# Patient Record
Sex: Male | Born: 1950
Health system: Southern US, Community
[De-identification: ages and names within clinical notes are randomized; demographics above are authoritative.]

## PROBLEM LIST (undated history)

## (undated) DIAGNOSIS — M1990 Unspecified osteoarthritis, unspecified site: Secondary | ICD-10-CM

## (undated) DIAGNOSIS — I639 Cerebral infarction, unspecified: Secondary | ICD-10-CM

## (undated) DIAGNOSIS — E785 Hyperlipidemia, unspecified: Secondary | ICD-10-CM

## (undated) DIAGNOSIS — I10 Essential (primary) hypertension: Secondary | ICD-10-CM

## (undated) DIAGNOSIS — I422 Other hypertrophic cardiomyopathy: Secondary | ICD-10-CM

## (undated) DIAGNOSIS — K219 Gastro-esophageal reflux disease without esophagitis: Secondary | ICD-10-CM

## (undated) DIAGNOSIS — R011 Cardiac murmur, unspecified: Secondary | ICD-10-CM

## (undated) DIAGNOSIS — I2510 Atherosclerotic heart disease of native coronary artery without angina pectoris: Secondary | ICD-10-CM

## (undated) DIAGNOSIS — C801 Malignant (primary) neoplasm, unspecified: Secondary | ICD-10-CM

## (undated) DIAGNOSIS — I219 Acute myocardial infarction, unspecified: Secondary | ICD-10-CM

## (undated) HISTORY — DX: Hyperlipidemia, unspecified: E78.5

## (undated) HISTORY — DX: Unspecified osteoarthritis, unspecified site: M19.90

## (undated) HISTORY — DX: Atherosclerotic heart disease of native coronary artery without angina pectoris: I25.10

## (undated) HISTORY — DX: Essential (primary) hypertension: I10

## (undated) HISTORY — DX: Cerebral infarction, unspecified: I63.9

## (undated) HISTORY — DX: Acute myocardial infarction, unspecified: I21.9

---

## 1991-12-05 HISTORY — PX: KNEE SURGERY: SHX244

## 1994-12-04 HISTORY — PX: ELBOW SURGERY: SHX618

## 2005-12-04 DIAGNOSIS — I2510 Atherosclerotic heart disease of native coronary artery without angina pectoris: Secondary | ICD-10-CM

## 2005-12-04 HISTORY — PX: CARDIAC CATHETERIZATION: SHX172

## 2005-12-04 HISTORY — DX: Atherosclerotic heart disease of native coronary artery without angina pectoris: I25.10

## 2006-01-01 ENCOUNTER — Emergency Department (HOSPITAL_COMMUNITY): Admission: EM | Admit: 2006-01-01 | Discharge: 2006-01-02 | Payer: Self-pay | Admitting: Emergency Medicine

## 2006-03-01 ENCOUNTER — Inpatient Hospital Stay (HOSPITAL_COMMUNITY): Admission: EM | Admit: 2006-03-01 | Discharge: 2006-03-03 | Payer: Self-pay | Admitting: Emergency Medicine

## 2008-08-11 LAB — HM COLONOSCOPY

## 2008-12-04 HISTORY — PX: CARDIAC CATHETERIZATION: SHX172

## 2009-07-13 ENCOUNTER — Encounter: Admission: RE | Admit: 2009-07-13 | Discharge: 2009-07-13 | Payer: Self-pay | Admitting: Cardiology

## 2009-07-15 ENCOUNTER — Ambulatory Visit (HOSPITAL_COMMUNITY): Admission: RE | Admit: 2009-07-15 | Discharge: 2009-07-15 | Payer: Self-pay | Admitting: Cardiology

## 2009-08-12 ENCOUNTER — Ambulatory Visit: Admission: RE | Admit: 2009-08-12 | Discharge: 2009-08-12 | Payer: Self-pay | Admitting: Cardiology

## 2010-09-13 LAB — HEPATIC FUNCTION PANEL
ALK PHOS: 48 U/L (ref 25–125)
ALT: 28 U/L (ref 10–40)
AST: 23 U/L (ref 14–40)
Bilirubin, Total: 1.5 mg/dL

## 2010-09-13 LAB — BASIC METABOLIC PANEL
BUN: 14 mg/dL (ref 4–21)
Creatinine: 1.2 mg/dL (ref <=1.3)
Glucose: 82 mg/dL
Potassium: 4.8 mmol/L (ref 3.4–5.3)
Sodium: 142 mmol/L (ref 137–147)

## 2010-09-13 LAB — CBC AND DIFFERENTIAL
HCT: 47 % (ref 41–53)
HEMOGLOBIN: 15.8 g/dL (ref 13.5–17.5)
Platelets: 193 10*3/uL (ref 150–399)
WBC: 6.7 10^3/mL

## 2010-09-13 LAB — CHLORIDE: Chloride: 106 mmol/L

## 2010-09-13 LAB — ALBUMIN: Albumin: 4.9

## 2010-09-13 LAB — TSH: TSH: 3.16 u[IU]/mL (ref <=5.90)

## 2010-09-13 LAB — PSA: PSA: 1.4

## 2010-12-25 ENCOUNTER — Encounter: Payer: Self-pay | Admitting: Family Medicine

## 2011-03-11 LAB — POCT I-STAT 3, ART BLOOD GAS (G3+)
Acid-Base Excess: 1 mmol/L (ref 0.0–2.0)
Bicarbonate: 25.7 mEq/L — ABNORMAL HIGH (ref 20.0–24.0)
TCO2: 27 mmol/L (ref 0–100)
pO2, Arterial: 98 mmHg (ref 80.0–100.0)

## 2011-03-11 LAB — POCT I-STAT 3, VENOUS BLOOD GAS (G3P V)
TCO2: 27 mmol/L (ref 0–100)
pH, Ven: 7.349 — ABNORMAL HIGH (ref 7.250–7.300)
pO2, Ven: 44 mmHg (ref 30.0–45.0)

## 2011-04-18 NOTE — Cardiovascular Report (Signed)
Brandon Gonzales, PULLIN               ACCOUNT NO.:  [id number]   MEDICAL RECORD NO.:  [id number]          PATIENT TYPE:  OIB   LOCATION:  2899                         FACILITY:  [facility]   PHYSICIAN:  Peter M. Swaziland, M.D.  DATE OF BIRTH:  03-25-1951   DATE OF PROCEDURE:  07/15/2009  DATE OF DISCHARGE:  07/15/2009                            CARDIAC CATHETERIZATION   INDICATIONS FOR PROCEDURE:  The patient is a 60 year old white male with  known history of coronary artery disease status post stenting of the LAD  and angioplasty of the diagonal branch in 2007.  He has a history of  hypertension and dyslipidemia.  He presents with symptoms of refractory  dyspnea on exertion.  This has been progressive.  Given these findings,  cardiac catheterization was recommended.   ACCESS:  Via the right femoral artery and vein using standard Seldinger  technique.   EQUIPMENT:  A 6-French 4 cm right and left Judkins catheter, 6-French  pigtail catheter, 6-French arterial sheath, 7-French venous sheath, 7-  French balloon-tip Swan-Ganz catheter.   PROCEDURES:  Right and left heart catheterization, coronary and left  ventricular angiography.   MEDICATIONS:  Local anesthesia 1% Xylocaine, Versed 2 mg IV.   CONTRAST:  Omnipaque 100 mL.   HEMODYNAMIC DATA:  Right atrial pressure is 8/5 with a mean of 4 mmHg.  Right ventricular pressure is 27 with an EDP of 5 mmHg.  Pulmonary  artery pressures 25/9 with a mean of 16 mmHg.  Pulmonary capillary wedge  pressure is 13/10 with a mean of 9 mmHg.  Aortic pressure is 114/66 with  a mean of 87 mmHg.  Left ventricular pressure is 110 with an EDP of 10  mmHg.  There is no mitral or aortic valve gradient.  Oxygen saturation  is 98% in the aorta and 77% in the pulmonary artery.  Cardiac output is  5.66 L/min with an index of 3.03.   ANGIOGRAPHIC DATA:  The left coronary artery arises and distributes  normally.  The left main coronary artery is normal.   The left  anterior descending artery has 20-30% narrowing in the proximal  vessel.  There is a long stent in the proximal to mid vessel that is  widely patent and has less than 20% irregularities.  The remainder of  the LAD is without significant disease.  There is a diagonal of moderate  size arising from within the stent.  There is approximately 20% ostial  stenosis in the diagonal.   The left circumflex coronary artery gives rise to a moderate first and  third obtuse marginal branches and a very tiny second obtuse marginal  vessel.  There is a segmental 50-60% stenosis in the mid circumflex.  The very tiny second marginal branch has a 90% ostial stenosis.   The right coronary artery is a large dominant vessel.  It appears normal  throughout its course.   Left ventricular angiography performed in the RAO view demonstrates  normal left ventricular size and contractility with normal systolic  function.  Ejection fraction is estimated at 65%.  There was no mitral  regurgitation or  prolapse.   FINAL INTERPRETATION:  1. Nonobstructive atherosclerotic coronary artery disease.  The stent      in the LAD is still widely patent.  There is moderate mid      circumflex disease that is unchanged from 2007.  2. Normal left ventricular function.  3. Normal right heart pressures.   PLAN:  Recommend continued medical therapy.           ______________________________  Peter M. Swaziland, M.D.     PMJ/MEDQ  D:  07/15/2009  T:  07/16/2009  Job:  [id number]   cc:   Holley Bouche, M.D.

## 2011-04-21 NOTE — H&P (Signed)
Brandon Gonzales, Brandon Gonzales               ACCOUNT NO.:  [id number]   MEDICAL RECORD NO.:  [id number]          PATIENT TYPE:  INP   LOCATION:  6523                         FACILITY:  [facility]   PHYSICIAN:  Francisca December, M.D.  DATE OF BIRTH:  [birth year]   DATE OF ADMISSION:  03/01/2006  DATE OF DISCHARGE:                                HISTORY & PHYSICAL   REASON FOR ADMISSION:  Chest pain.   HISTORY OF PRESENT ILLNESS:  Brandon Gonzales is a pleasant 60 year old Gonzales  without previous significant cardiac history but with a longstanding  hypertension.  He did recently undergo an upper cardiogram at Dr. Elvis Coil  office that apparently showed mitral valve prolapse.  Over the past week, he  developed increasing shortness of breath with exertion such that now minimal  walking will make him quite out of breath.  Two days ago he awoke with  anterior substernal burning chest discomfort that radiated to across the  chest and into the left upper and lower arm, questionably into the jaw as  well.  There was not associated diaphoresis and resolved spontaneously after  about one hour.  There was also no nausea.  The symptoms then recurred this  afternoon, and the patient called EMS.  He received 4 baby aspirin and a  sublingual nitroglycerin in the ambulance, and by the time of arrival in the  emergency room, his symptoms were significantly decreased. This was around  1840. At the time of my evaluation at 2145, his symptoms are now starting to  increase again.   PAST MEDICAL HISTORY:  1.  Hypertension.  2.  GERD.   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  No alcohol or tobacco use.   CURRENT MEDICATIONS:  1.  Over-the-counter Prilosec 20 mg p.o. twice daily.  2.  Norvasc 5 mg p.o. daily.  3.  Lisinopril 10 mg p.o. daily.  4.  Propecia.   DRUG ALLERGIES:  None known.   FAMILY HISTORY:  Mother died with myocardial infarction at age 42.  Father  had  coronary artery disease later in age.   REVIEW OF SYSTEMS:  Negative except as mentioned above.   PHYSICAL EXAMINATION:  VITAL SIGNS: Blood pressure 142/94, pulse 98 and  regular, temperature 98.1, respirations 20.  GENERAL: A Brandon year old, comfortable, well-appearing Gonzales in no distress.  HEENT:  Unremarkable.  The head is atraumatic and normocephalic. Pupils  equal, round, and reactive to light and accommodation.  Extraocular  movements are intact.  Sclerae are anicteric.  Oral mucosa is pink and  moist.  Tongue is not coated.  Teeth and gums in good repair.  NECK: Supple without thyromegaly or mass.  Carotid upstrokes were normal.  There is no bruit.  There is no jugular venous distention.  CHEST: Clear with good excursion bilaterally.  No wheezes, rales, or  rhonchi.  HEART: Regular rhythm. Normal S1 and S2 is heard.  No S3, S4, murmur, click,  or rub noted.  ABDOMEN: Soft, flat, nontender, without hepatosplenomegaly or midline  pulsatile mass. Bowel sounds are present in  all quadrants.  EXTERNAL GENITALIA: Normal male phallus, descended testicles, no lesion.  RECTAL: No performed.  EXTREMITIES:  Full range of motion, no edema, intact distal pulses.  NEUROLOGIC: Cranial nerves II-XII intact.  Motor and sensory grossly intact.  Gait not tested.  SKIN:  Warm, dry, and clear.   Electrocardiogram: Normal sinus rhythm, normal EKG.   Point-of-care cardiac enzymes positive x1.   Chest x-ray pending.   IMPRESSION:  1.  Unstable angina pectoris, new onset angina.  2.  Hypertension.  3.  Gastroesophageal reflux disease.  His gastroesophageal reflux disease      symptoms are not similar to this.   PLAN:  1.  The patient is admitted to telemetry monitoring.  2.  Rule out myocardial infarction protocol.  3.  Serial CK and troponin enzymes will be obtained.  4.  Repeat ECG in the a.m.  5.  Will begin IV nitroglycerin at 10 mcg per minute, titrate as necessary      to maintain systolic  blood pressure greater than 110.  6.  Begin subcutaneous Lovenox 35 mg q. 12 h.  7.  The patient has already received aspirin today.  Will continue.  8.  Begin metoprolol 25 mg p.o. twice daily.  9.  Remain n.p.o. past midnight.  10. Catheterization versus Cardiolite per Dr. Swaziland in the morning.      Francisca December, M.D.  Electronically Signed     JHE/MEDQ  D:  03/01/2006  T:  03/03/2006  Job:  [id number]   cc:   Holley Bouche, M.D.  Fax: [phone]   Peter M. Swaziland, M.D.  Fax: [phone]

## 2011-04-21 NOTE — Discharge Summary (Signed)
NAMELASARO, PRIMM               ACCOUNT NO.:  [id number]   MEDICAL RECORD NO.:  [id number]          PATIENT TYPE:  INP   LOCATION:  6523                         FACILITY:  [facility]   PHYSICIAN:  Peter M. Swaziland, M.D.  DATE OF BIRTH:  03/08/1951   DATE OF ADMISSION:  03/01/2006  DATE OF DISCHARGE:  03/03/2006                                 DISCHARGE SUMMARY   HISTORY OF PRESENT ILLNESS:  Mr. Lecomte is a 60 year old white male with a  history of long-standing hypertension who presented with a one week history  of increasing dyspnea on exertion now progressing to symptoms of dyspnea at  rest.  He has also had some associated anterior substernal chest burning and  discomfort radiating across his chest into his left upper and lower arm and  into his jaw.  The patient's symptoms were relieved with nitroglycerin.  He  was admitted for further evaluation. For details of his past medical  history, social history, family history, and physical exam, please see  admission history and physical.   LABORATORY DATA:  His chest x-ray showed minimal left lower lobe  atelectasis, otherwise clear. His ECG showed normal sinus rhythm with normal  ECG.  White count was 5400, hemoglobin 14.9, hematocrit 42.1, platelets  175,000.  Coags were normal.  Sodium 141, potassium 4.3, chloride 108, CO2  27, glucose 100, BUN 8, creatinine 1.4. Cardiac enzymes were negative x3.  Cholesterol is 216, triglycerides 258, HDL 28, LDL 136.   HOSPITAL COURSE:  The patient was admitted to telemetry monitoring.  He was  initially treated with IV nitroglycerin and subcu Lovenox.  He was started  on beta blocker therapy.  Because of his fairly classic symptoms, we  recommended further evaluation with cardiac catheterization.  This was  performed on March 02, 2006.  He had a severe bifurcation stenosis in the  LAD diagonal.  This is up to 70-80% in the proximal LAD and 90% ostial  stenosis of the first diagonal.  There was  moderate diffuse disease in the  distal circumflex up to 50%.  The right coronary was without significant  disease.  He had a normal left ventricular function.  Abdominal aortography  showed no aneurysm and his renal arteries appeared normal.  He subsequently  underwent successful stenting of the LAD with balloon angioplasty of  diagonal through the stent.  The LAD was stented with a 3 x 28 mm Liberty  stent.  He was treated with aspirin, Plavix and had received 18 hours of IV  Integrilin.  He did very well following the procedure and had no recurrent  chest pain.  He had no groin complications.  His ECG remained normal.  He  was ambulated and able to be discharged home the following day.   DISCHARGE DIAGNOSIS:  1.  Unstable angina.  2.  Coronary disease status post successful stenting of the left anterior      descending proximal to mid and also balloon angioplasty of the first      diagonal branch.  3.  Hypertension.  4.  Dyslipidemia.  5.  Gastroesophageal reflux  disease.   DISCHARGE MEDICATIONS:  Lisinopril to 10 mg, 1/2 tablet daily, coated  aspirin 325 mg daily, Plavix 75 mg per day, Norvasc 5 mg per day, Prilosec  20 mg per day, Lipitor 10 mg per day, Toprol XL 25 mg per day, nitroglycerin  sublingual p.r.n.   DISCHARGE INSTRUCTIONS:  He will follow up with Dr. Swaziland in two weeks.   DISCHARGE STATUS:  Improved.           ______________________________  Peter M. Swaziland, M.D.     PMJ/MEDQ  D:  04/05/2006  T:  04/05/2006  Job:  [id number]   cc:   Holley Bouche, M.D.  Fax: [phone]

## 2011-04-21 NOTE — Cardiovascular Report (Signed)
Brandon Gonzales, Brandon Gonzales               ACCOUNT NO.:  [id number]   MEDICAL RECORD NO.:  [id number]          PATIENT TYPE:  INP   LOCATION:  6523                         FACILITY:  [facility]   PHYSICIAN:  Peter M. Swaziland, M.D.  DATE OF BIRTH:  04-06-1951   DATE OF PROCEDURE:  03/02/2006  DATE OF DISCHARGE:  03/03/2006                              CARDIAC CATHETERIZATION   INDICATIONS FOR PROCEDURE:  A 60 year old white male with history of  hypertension and dyslipidemia who presents with unstable angina.   PROCEDURES:  Left heart catheterization, coronary and left ventricular  angiography, abdominal aortography, intracoronary stenting of the proximal  to mid-LAD with balloon angioplasty of the first diagonal branch.   ACCESS:  Via right femoral artery using standard Seldinger technique.   EQUIPMENT:  6-French 4 cm right and left Judkins catheter, 6-French pigtail  catheter, 6-French arterial sheath, a 6-French left Voda 3.5 guide, 0.014 Hi-  Torque floppy wire x2, 3.0 x 20 mm Quantum Maverick balloon and a 3.0 x 28  mm Liberte stent.   CONTRAST:  260 mL of Omnipaque.   MEDICATIONS:  The patient was on nitroglycerin drip at 10 mcg per minute,  Versed 2 mg IV, heparin 4300 units IV with subsequent ACT of 371, Integrilin  double bolus 180 mcg/kg followed by continuous infusion of 2 mcg/kg per  minute, nitroglycerin 200 mcg intracoronary x2, Plavix 600 mg p.o.   HEMODYNAMIC DATA:  Aortic pressure is 113/72 with a mean of 92 mmHg.  Left  ventricular pressure is 113 with EDP of 16 mmHg.   ANGIOGRAPHIC DATA:  Left coronary arises and distributes normally.  The left  main coronary is normal.   Left anterior descending artery has a long segmental 70% stenosis extending  from the proximal to mid-vessel, spanning the first diagonal branch and the  first septal perforator.  The first diagonal branch has a 90% ostial  stenosis with diffuse 50-60% stenosis in the proximal vessel.   The left  circumflex coronary gives rise to a small first obtuse marginal  vessel.  It then gives rise to a large terminal second obtuse marginal  branch.  There is a 50-60% stenosis in the distal circumflex prior to the  second obtuse marginal branch.   The right coronary is a very large dominant vessel that appears normal.   Left ventricular angiography was performed in the RAO view.  This  demonstrates normal left ventricular size and contractility with normal  systolic function.  Ejection fraction is estimated at 65-70%.  There is no  significant mitral insufficiency.  The aortic root appears normal.   Abdominal aortography demonstrates normal aortic caliber without aneurysm.  The renal arteries and mesenteric vessels all appear normal.   INTERVENTIONAL PROCEDURE:  We proceeded at this time with intervention of  the LAD diagonal bifurcation stenosis.  The patient was premedicated as  noted above.  We had excellent guide support.  Both the LAD and diagonal  were wired simultaneously.  We initially dilated the diagonal branch using a  3.0 x 20 mm Quantum Maverick balloon dilating to 9 and then to 12  atmospheres.  We then used the same balloon to dilate the LAD to 12  atmospheres x2.  We then again switched to the diagonal branch and dilated  once again to 10 atmospheres.  At this point, there appeared to be improved  perfusion down both branches, although there appeared to be a focal intimal  tear in the LAD.  The LAD was stented using a 3.0 x 28 mm Liberte stent  spanning the first diagonal branch.  This was deployed at 9 atmospheres and  postdilated to 14 atmospheres.  Prior to deploying the stent, the diagonal  wire was withdrawn.  After deployment of the LAD stent, the diagonal was  once again wired and was dilated through the stent with the 3.0 mm Quantum  up to 14 atmospheres.  This yielded an excellent angiographic result in both  the LAD and diagonal with 0% residual stenosis and TIMI  grade III flow down  both branches.  It was noted during all balloon inflations the patient has  typical anginal symptoms.  He was painfree at the end the procedure.   FINAL INTERPRETATION:  1.  Single-vessel obstructive atherosclerotic coronary disease with complex      bifurcation stenosis of the left anterior descending artery diagonal.  2.  Moderate nonobstructive atherosclerotic disease in the distal      circumflex.  3.  Normal left ventricular function.  4.  Normal abdominal aorta and renal arteries.  5.  Successful coronary intervention with stenting of the proximal to mid-      left anterior descending artery and balloon angioplasty of the first      diagonal branch.           ______________________________  Peter M. Swaziland, M.D.     PMJ/MEDQ  D:  03/02/2006  T:  03/05/2006  Job:  [id number]   cc:   Holley Bouche, M.D.  Fax: [phone]

## 2011-06-16 ENCOUNTER — Other Ambulatory Visit: Payer: Self-pay | Admitting: Cardiology

## 2011-06-16 NOTE — Telephone Encounter (Signed)
escribe medication per fax request  

## 2011-12-05 HISTORY — PX: CORONARY ANGIOPLASTY WITH STENT PLACEMENT: SHX49

## 2011-12-13 ENCOUNTER — Other Ambulatory Visit: Payer: Self-pay | Admitting: *Deleted

## 2011-12-25 ENCOUNTER — Other Ambulatory Visit: Payer: Self-pay

## 2011-12-25 ENCOUNTER — Telehealth: Payer: Self-pay | Admitting: Cardiology

## 2011-12-25 DIAGNOSIS — I2510 Atherosclerotic heart disease of native coronary artery without angina pectoris: Secondary | ICD-10-CM

## 2011-12-25 DIAGNOSIS — E785 Hyperlipidemia, unspecified: Secondary | ICD-10-CM

## 2011-12-25 NOTE — Telephone Encounter (Signed)
New Problem:     Patient called in needing refills of his amlodipine (it is not in file).  Please call back if you have any questions.

## 2011-12-25 NOTE — Telephone Encounter (Signed)
Follow-up:    Patient needs to have some orders for blood work placed for the 4th of February.

## 2011-12-25 NOTE — Telephone Encounter (Signed)
Follow-up:     Patient has called back and would really like to speak with you about his prescription of Amlodipine that is not on file. Please call back.

## 2011-12-25 NOTE — Telephone Encounter (Signed)
Patient called this evening after not receiving a call back earlier re: amlodipine which is not currently on file. The last OV I see is from 2010 and did in fact list amlodipine, but he has not been seen since. He last filled this 09/19/11 with 90 tabs and ran out yesterday. He has an OV coming up in February. Since I did not see a decision documented earlier today (notes indicate our office tried to call patient back), I offered to call in emergency RX of Norvasc 10mg  one tab po daily disp #3 zero refills to [facility]. The patient expressed understanding and gratitude and will call back tomorrow to find out office's decision.  Dayna Dunn PA-C

## 2011-12-25 NOTE — Telephone Encounter (Signed)
Orders for Bmet,Lipids,Hepatic entered for 01/08/12.

## 2011-12-25 NOTE — Telephone Encounter (Signed)
Patient called no answer.LMTC. 

## 2011-12-26 ENCOUNTER — Telehealth: Payer: Self-pay | Admitting: Cardiology

## 2011-12-26 NOTE — Telephone Encounter (Signed)
Patient called, out of amlodipine 10 mg.Appointment scheduled with Dr.Jordan 01/11/12.Spoke to pharmacist at [address] was given order for amlodipine 10mg  enough until 01/11/12.

## 2011-12-26 NOTE — Telephone Encounter (Signed)
Patient called,no answer.LMTC 

## 2011-12-26 NOTE — Telephone Encounter (Signed)
Patient called,out of amlodipine 10 mg.Spoke to pharmacist at [address].amlodipine 10 mg daily order given enough until patient's appointment 01/11/12.

## 2011-12-26 NOTE — Telephone Encounter (Signed)
Pt rtn call 

## 2012-01-08 ENCOUNTER — Other Ambulatory Visit (INDEPENDENT_AMBULATORY_CARE_PROVIDER_SITE_OTHER): Payer: 59 | Admitting: *Deleted

## 2012-01-08 DIAGNOSIS — I2510 Atherosclerotic heart disease of native coronary artery without angina pectoris: Secondary | ICD-10-CM

## 2012-01-08 DIAGNOSIS — E785 Hyperlipidemia, unspecified: Secondary | ICD-10-CM

## 2012-01-08 LAB — BASIC METABOLIC PANEL
BUN: 19 mg/dL (ref 6–23)
Chloride: 105 mEq/L (ref 96–112)
GFR: 51.47 mL/min — ABNORMAL LOW (ref >60.00)

## 2012-01-08 LAB — LIPID PANEL
Cholesterol: 198 mg/dL (ref 0–200)
LDL Cholesterol: 128 mg/dL — ABNORMAL HIGH (ref 0–99)
Total CHOL/HDL Ratio: 6
Triglycerides: 188 mg/dL — ABNORMAL HIGH (ref 0.0–149.0)
VLDL: 37.6 mg/dL (ref 0.0–40.0)

## 2012-01-10 ENCOUNTER — Other Ambulatory Visit: Payer: Self-pay

## 2012-01-10 ENCOUNTER — Encounter: Payer: Self-pay | Admitting: *Deleted

## 2012-01-10 DIAGNOSIS — E785 Hyperlipidemia, unspecified: Secondary | ICD-10-CM | POA: Insufficient documentation

## 2012-01-10 DIAGNOSIS — M1990 Unspecified osteoarthritis, unspecified site: Secondary | ICD-10-CM | POA: Insufficient documentation

## 2012-01-10 DIAGNOSIS — Z8719 Personal history of other diseases of the digestive system: Secondary | ICD-10-CM | POA: Insufficient documentation

## 2012-01-10 MED ORDER — AMLODIPINE BESYLATE 10 MG PO TABS: 10.0000 mg | ORAL_TABLET | Freq: Every day | ORAL | Status: DC

## 2012-01-11 ENCOUNTER — Encounter: Payer: Self-pay | Admitting: Cardiology

## 2012-01-11 ENCOUNTER — Ambulatory Visit (INDEPENDENT_AMBULATORY_CARE_PROVIDER_SITE_OTHER): Payer: 59 | Admitting: Cardiology

## 2012-01-11 VITALS — BP 122/84 | HR 61 | Ht 68.0 in | Wt 177.2 lb

## 2012-01-11 DIAGNOSIS — I10 Essential (primary) hypertension: Secondary | ICD-10-CM

## 2012-01-11 DIAGNOSIS — I2510 Atherosclerotic heart disease of native coronary artery without angina pectoris: Secondary | ICD-10-CM

## 2012-01-11 DIAGNOSIS — E785 Hyperlipidemia, unspecified: Secondary | ICD-10-CM

## 2012-01-11 MED ORDER — METOPROLOL SUCCINATE ER 50 MG PO TB24: 50.0000 mg | ORAL_TABLET | Freq: Every day | ORAL | Status: DC

## 2012-01-11 MED ORDER — LISINOPRIL 10 MG PO TABS: 10.0000 mg | ORAL_TABLET | Freq: Every day | ORAL | Status: DC

## 2012-01-11 MED ORDER — AMLODIPINE BESYLATE 10 MG PO TABS: 10.0000 mg | ORAL_TABLET | Freq: Every day | ORAL | Status: DC

## 2012-01-11 NOTE — Assessment & Plan Note (Signed)
Recent lipid panel showed mild elevation of his triglycerides and low HDL. He will continue with his current dose of niacin and will add visual 2 g per day. Encouraged him to exercise more and to lose weight. I will followup again in one year and we will check fasting lab work at that time.

## 2012-01-11 NOTE — Patient Instructions (Signed)
Add fish oil 2 grams per day.  Continue your other medication.  I will see you again in 1 year.

## 2012-01-11 NOTE — Progress Notes (Signed)
   Brandon Gonzales Date of Birth: May 15, 1951 Medical Record #[id number]  History of Present Illness: Brandon Gonzales is seen for followup today. He has a history of coronary disease and is status post stent of the LAD and angioplasty of diagonal branch in 2007. This was with a 3.0 x 28 mm Liberte stent. He underwent repeat cardiac catheterization in August of 2010 which demonstrated nonobstructive disease. He has done well since then without recurrent chest pain. He admits that he needs to exercise more. He has had no shortness of breath. He does have a history of intolerance to statins.  Current Outpatient Prescriptions on File Prior to Visit  Medication Sig Dispense Refill  . aspirin 81 MG tablet Take 81 mg by mouth daily.      . niacin 500 MG tablet Take 500 mg by mouth 2 (two) times daily with a meal.      . Omeprazole Magnesium (PRILOSEC OTC PO) Take by mouth.        Allergies  Allergen Reactions  . Antihistamines, Chlorpheniramine-Type   . Other     Intolerant to statins and zetia    Past Medical History  Diagnosis Date  . Hypertension   . Dyslipidemia   . H/O gastroesophageal reflux (GERD)   . Arthritis   . Coronary artery disease 2007    stent LAD,PTCA of diagonal branch    Past Surgical History  Procedure Date  . Knee surgery 1993    left  . Elbow surgery 1996    right  . Cardiac catheterization 2007    stent to LAD and PTCA of diagonal branch  . Cardiac catheterization 2010    History  Smoking status  . Never Smoker   Smokeless tobacco  . Not on file    History  Alcohol Use     Family History  Problem Relation Age of Onset  . Heart attack Mother   . Heart disease Mother     Review of System: As noted in history of present illness..  All other systems were reviewed and are negative.  Physical Exam: BP 122/84  Pulse 61  Ht 5\' 8"  (1.727 m)  Wt 177 lb 3.2 oz (80.377 kg)  BMI 26.94 kg/m2 The patient is alert and oriented x 3.  The mood and affect are  normal.  The skin is warm and dry.  Color is normal.  The HEENT exam reveals that the sclera are nonicteric.  The mucous membranes are moist.  The carotids are 2+ without bruits.  There is no thyromegaly.  There is no JVD.  The lungs are clear.  The chest wall is non tender.  The heart exam reveals a regular rate with a normal S1 and S2.  There are no murmurs, gallops, or rubs.  The PMI is not displaced.   Abdominal exam reveals good bowel sounds.  There is no guarding or rebound.  There is no hepatosplenomegaly or tenderness.  There are no masses.  Exam of the legs reveal no clubbing, cyanosis, or edema.  The legs are without rashes.  The distal pulses are intact.  Cranial nerves II - XII are intact.  Motor and sensory functions are intact.  The gait is normal.  LABORATORY DATA: ECG today is normal.  Assessment / Plan:

## 2012-01-11 NOTE — Assessment & Plan Note (Signed)
>>  ASSESSMENT AND PLAN FOR CAD (CORONARY ARTERY DISEASE) WRITTEN ON 01/11/2012  6:15 PM BY Swaziland, PETER M, MD  Status post stent of the LAD and angioplasty of diagonal 2007. He remains asymptomatic. Cardiac catheterization 2010 showed nonobstructive disease. We will continue with his current medical therapy and risk factor modification.

## 2012-01-11 NOTE — Assessment & Plan Note (Signed)
Status post stent of the LAD and angioplasty of diagonal 2007. He remains asymptomatic. Cardiac catheterization 2010 showed nonobstructive disease. We will continue with his current medical therapy and risk factor modification.

## 2012-03-08 ENCOUNTER — Emergency Department (HOSPITAL_COMMUNITY)
Admission: EM | Admit: 2012-03-08 | Discharge: 2012-03-08 | Disposition: A | Discharge status: Home / Self Care | Payer: 59 | Attending: Emergency Medicine | Admitting: Emergency Medicine

## 2012-03-08 ENCOUNTER — Ambulatory Visit (INDEPENDENT_AMBULATORY_CARE_PROVIDER_SITE_OTHER): Payer: 59 | Admitting: Family Medicine

## 2012-03-08 ENCOUNTER — Other Ambulatory Visit: Payer: Self-pay

## 2012-03-08 ENCOUNTER — Telehealth: Payer: Self-pay | Admitting: Cardiology

## 2012-03-08 ENCOUNTER — Encounter (HOSPITAL_COMMUNITY): Payer: Self-pay | Admitting: *Deleted

## 2012-03-08 ENCOUNTER — Emergency Department (HOSPITAL_COMMUNITY): Payer: 59

## 2012-03-08 DIAGNOSIS — Z7982 Long term (current) use of aspirin: Secondary | ICD-10-CM | POA: Insufficient documentation

## 2012-03-08 DIAGNOSIS — R531 Weakness: Secondary | ICD-10-CM

## 2012-03-08 DIAGNOSIS — R002 Palpitations: Secondary | ICD-10-CM

## 2012-03-08 DIAGNOSIS — E785 Hyperlipidemia, unspecified: Secondary | ICD-10-CM | POA: Insufficient documentation

## 2012-03-08 DIAGNOSIS — R42 Dizziness and giddiness: Secondary | ICD-10-CM | POA: Insufficient documentation

## 2012-03-08 DIAGNOSIS — R110 Nausea: Secondary | ICD-10-CM

## 2012-03-08 DIAGNOSIS — R5381 Other malaise: Secondary | ICD-10-CM | POA: Insufficient documentation

## 2012-03-08 DIAGNOSIS — R059 Cough, unspecified: Secondary | ICD-10-CM | POA: Insufficient documentation

## 2012-03-08 DIAGNOSIS — R011 Cardiac murmur, unspecified: Secondary | ICD-10-CM | POA: Insufficient documentation

## 2012-03-08 DIAGNOSIS — I10 Essential (primary) hypertension: Secondary | ICD-10-CM | POA: Insufficient documentation

## 2012-03-08 DIAGNOSIS — R5383 Other fatigue: Secondary | ICD-10-CM | POA: Insufficient documentation

## 2012-03-08 DIAGNOSIS — R05 Cough: Secondary | ICD-10-CM | POA: Insufficient documentation

## 2012-03-08 DIAGNOSIS — R0609 Other forms of dyspnea: Secondary | ICD-10-CM | POA: Insufficient documentation

## 2012-03-08 DIAGNOSIS — Z9861 Coronary angioplasty status: Secondary | ICD-10-CM | POA: Insufficient documentation

## 2012-03-08 DIAGNOSIS — I200 Unstable angina: Secondary | ICD-10-CM

## 2012-03-08 DIAGNOSIS — R0989 Other specified symptoms and signs involving the circulatory and respiratory systems: Secondary | ICD-10-CM | POA: Insufficient documentation

## 2012-03-08 DIAGNOSIS — I2510 Atherosclerotic heart disease of native coronary artery without angina pectoris: Secondary | ICD-10-CM | POA: Insufficient documentation

## 2012-03-08 LAB — CBC
HCT: 43.8 % (ref 39.0–52.0)
Hemoglobin: 15.7 g/dL (ref 13.0–17.0)
MCH: 30.4 pg (ref 26.0–34.0)
Platelets: 174 10*3/uL (ref 150–400)
RDW: 12.9 % (ref 11.5–15.5)

## 2012-03-08 LAB — POCT I-STAT, CHEM 8
Chloride: 106 mEq/L (ref 96–112)
Creatinine, Ser: 1.1 mg/dL (ref 0.50–1.35)
Glucose, Bld: 90 mg/dL (ref 70–99)
Potassium: 4.1 mEq/L (ref 3.5–5.1)
TCO2: 27 mmol/L (ref 0–100)

## 2012-03-08 LAB — DIFFERENTIAL
Lymphocytes Relative: 21 % (ref 12–46)
Neutro Abs: 5.4 10*3/uL (ref 1.7–7.7)
Neutrophils Relative %: 68 % (ref 43–77)

## 2012-03-08 LAB — POCT I-STAT TROPONIN I

## 2012-03-08 MED ORDER — SODIUM CHLORIDE 0.9 % IV BOLUS (SEPSIS): 1000.0000 mL | Freq: Once | INTRAVENOUS | Status: DC

## 2012-03-08 NOTE — Telephone Encounter (Signed)
Patient called, stated for the past 3 to 4 days he has had a weird feeling in chest,feels jittery.No chest pain,no burning sensation in chest.No sob,no dizziness.States he is very weak,when he stands up feels like going to fall down.States B/P ranging 160/95 to 144/90,pulse-62,63.Patient advised to go to [facility] ER.Appointment scheduled with Norma Fredrickson NP 03/11/12.

## 2012-03-08 NOTE — ED Notes (Addendum)
C/o constant palpitations x 1 week with intermittent SOB on exertion, "heart racing", dizziness, lightheadedness & nausea.  "It feels like it's rolling, beating funny". Denies ever feeeling CP, episodes onset non-exertional.  Reports had a negative cardiac cath '2011

## 2012-03-08 NOTE — ED Notes (Signed)
No rx given, pt voiced understanding to f/u with cardiologist on 4/8

## 2012-03-08 NOTE — Telephone Encounter (Signed)
New Msg: Pt calling wanting to speak with nurse/MD regarding weird feeling pt is feeling in chest and fluctuating BP. Pt stated is not in any pain however he feels like he is going to fall in the floor any minute. Pt stated he doesn't know if his heart is stopping but he feels jittery and really cold. Pt stated symptoms have been present for several days now.  Please return pt call to discuss/advise further.

## 2012-03-08 NOTE — ED Notes (Signed)
From Urgent Care - c/o palpitations x 1 week. Denies CP presently. Given ASA 324mg , IV PTA

## 2012-03-08 NOTE — ED Provider Notes (Signed)
History     CSN: [id number]  Arrival date & time 03/08/12  2952   First MD Initiated Contact with Patient 03/08/12 2002      Chief Complaint  Patient presents with  . Palpitations    (Consider location/radiation/quality/duration/timing/severity/associated sxs/prior treatment) Patient is a 61 y.o. male presenting with weakness. The history is provided by the patient.  Weakness Primary symptoms do not include headaches, dizziness, visual change, focal weakness, loss of sensation, fever, nausea or vomiting. The symptoms began 5 to 7 days ago. The symptoms are worsening. The neurological symptoms are diffuse. Context: at all times.  Additional symptoms include weakness.    Past Medical History  Diagnosis Date  . Hypertension   . Dyslipidemia   . H/O gastroesophageal reflux (GERD)   . Arthritis   . Coronary artery disease 2007    stent LAD,PTCA of diagonal branch    Past Surgical History  Procedure Date  . Knee surgery 1993    left  . Elbow surgery 1996    right  . Cardiac catheterization 2007    stent to LAD and PTCA of diagonal branch  . Cardiac catheterization 2010  . Coronary stent placement   . Coronary angioplasty     Family History  Problem Relation Age of Onset  . Heart attack Mother   . Heart disease Mother     History  Substance Use Topics  . Smoking status: Never Smoker   . Smokeless tobacco: Not on file  . Alcohol Use: No      Review of Systems  Constitutional: Negative for fever and chills.  HENT: Negative for congestion and rhinorrhea.   Respiratory: Positive for cough. Negative for shortness of breath.   Cardiovascular: Positive for palpitations (intermittently). Negative for chest pain and leg swelling.  Gastrointestinal: Negative for nausea, vomiting, abdominal pain, constipation and blood in stool.  Genitourinary: Negative for dysuria and decreased urine volume.  Neurological: Positive for weakness and light-headedness. Negative for  dizziness, focal weakness, numbness and headaches.  Psychiatric/Behavioral: Negative for confusion.  All other systems reviewed and are negative.    Allergies  Antihistamines, chlorpheniramine-type and Other  Home Medications   Current Outpatient Rx  Name Route Sig Dispense Refill  . AMLODIPINE BESYLATE 10 MG PO TABS Oral Take 10 mg by mouth daily.    . ASPIRIN 81 MG PO TABS Oral Take 81 mg by mouth daily.    [name] Kitchen LISINOPRIL 10 MG PO TABS Oral Take 10 mg by mouth daily.    [name] Kitchen METOPROLOL SUCCINATE ER 50 MG PO TB24 Oral Take 50 mg by mouth daily. Take with or immediately following a meal.    . NIACIN 500 MG PO TABS Oral Take 500 mg by mouth 2 (two) times daily with a meal.    . PRILOSEC OTC PO Oral Take by mouth.      BP 107/67  Pulse 69  Temp(Src) 98.1 F (36.7 C) (Oral)  Resp 17  SpO2 95%  Physical Exam  Nursing note and vitals reviewed. Constitutional: He is oriented to person, place, and time. He appears well-developed and well-nourished.  HENT:  Head: Normocephalic and atraumatic.  Right Ear: External ear normal.  Left Ear: External ear normal.  Nose: Nose normal.  Neck: Neck supple.  Cardiovascular: Normal rate, regular rhythm and intact distal pulses.   Murmur heard.  Systolic murmur is present  Pulmonary/Chest: Effort normal and breath sounds normal. No respiratory distress. He has no wheezes. He has no rales.  Abdominal: Soft. He  exhibits no distension and no mass. There is no tenderness. There is no rebound and no guarding.  Musculoskeletal: He exhibits no edema.  Lymphadenopathy:    He has no cervical adenopathy.  Neurological: He is alert and oriented to person, place, and time.  Skin: Skin is warm and dry.    ED Course  Procedures (including critical care time)   Labs Reviewed  CBC  DIFFERENTIAL  POCT I-STAT, CHEM 8  POCT I-STAT TROPONIN I   Dg Chest 2 View  03/08/2012  *RADIOLOGY REPORT*  Clinical Data: Weakness.  Dizziness.  Chest palpitations.   Prior cardiac angioplasty.  Hypertension.  CHEST - 2 VIEW  Comparison: 05/31/2011  Findings: Cardiac and mediastinal contours appear normal.  The lungs appear clear.  No pleural effusion is identified.  IMPRESSION:  No significant abnormality identified.  Original Report Authenticated By: Dellia Cloud, M.D.     Date: 03/08/2012  Rate: 70  Rhythm: normal sinus rhythm  QRS Axis: normal  Intervals: normal  ST/T Wave abnormalities: normal  Conduction Disutrbances:none  Narrative Interpretation:   Old EKG Reviewed: unchanged   1. Weakness generalized       MDM  61 yo male with weakness x 1 week, intermittent palpitations, cough, mild exertional dyspnea. Spoke with his cardiologist (Dr. Swaziland) today who recommended he come to ED. Appears well, improved with mild amount of IVF. EKG normal, trop negative. Doubt ACS due to prolonged symptoms. No signs of his palpations in ED. Labs benign, and no PNA on CXR. Will d/c home, probably needs Holter as outpatient. Will keep his appt with cardiology in 3 days.         Pricilla Loveless, MD 03/09/12 [phone]

## 2012-03-08 NOTE — Discharge Instructions (Signed)

## 2012-03-08 NOTE — Progress Notes (Signed)
Is a 61 year old gentleman comes in with chest pain today. Having palpitations for one week. These palpitations have become more frequent and more uncomfortable. He's had some vague chest pains as well as nausea during this time.  Patient's past medical history is significant for having had a stent in 2007, hypertension. He's accompanied by his wife who insisted that he get this evaluated today. He says that he has become more short of breath over the past month or so so that now he is short of breath going up one flight of stairs. Had no increase in edema.  He's had no vomiting crushing chest pain.  Objective: Patient appears to be in no acute distress number he is alert.  Skin: Tanned and without jaundice  Chest:bibasilar rales otherwise clear   heart: Blowing midsystolic murmur is heard left sternal border regular radiation.  Neck: Supple, no bruits or thyromegaly or rub   abdomen: Soft nontender without HSM Aref  EKG: Unchanged from previous tracings  Assessment: Patient is at increased risk for coronary disease given the fact that he has a stent. His increasing symptoms make me concerned that there may be a progressive coronary obstruction and therefore I am sending him by ambulance to the emergency department for further workup  Plan: Starting IV, oxygen, aspirin per protocol  EMS transfer to our emergency department

## 2012-03-11 ENCOUNTER — Ambulatory Visit: Payer: 59 | Admitting: Nurse Practitioner

## 2012-03-11 ENCOUNTER — Telehealth: Payer: Self-pay

## 2012-03-11 NOTE — Telephone Encounter (Signed)
Patient called, was told calling to check to make sure ok since missed appointment with Norma Fredrickson NP this morning.Patient states he had a bad migraine headache this am.States he went to ER last week like advised with weird feeling in chest,weakness.States his heart checked out ok.  States he thinks stress is the problem.Advised if he had any more problems call back.

## 2012-03-13 NOTE — ED Provider Notes (Signed)
I saw and evaluated the patient, reviewed the resident's note and I agree with the findings and plan.   .Face to face Exam:  General:  Awake HEENT:  Atraumatic Resp:  Normal effort Abd:  Nondistended Neuro:No focal weakness Lymph: No adenopathy   Nelia Shi, MD 03/13/12 2203

## 2012-06-10 ENCOUNTER — Other Ambulatory Visit: Payer: Self-pay | Admitting: *Deleted

## 2012-06-10 MED ORDER — LISINOPRIL 10 MG PO TABS: 10.0000 mg | ORAL_TABLET | Freq: Every day | ORAL | Status: DC

## 2012-06-10 MED ORDER — METOPROLOL SUCCINATE ER 50 MG PO TB24: 50.0000 mg | ORAL_TABLET | Freq: Every day | ORAL | Status: DC

## 2012-06-12 ENCOUNTER — Other Ambulatory Visit: Payer: Self-pay | Admitting: *Deleted

## 2012-06-12 MED ORDER — LISINOPRIL 10 MG PO TABS: 10.0000 mg | ORAL_TABLET | Freq: Every day | ORAL | Status: DC

## 2012-06-12 MED ORDER — METOPROLOL SUCCINATE ER 50 MG PO TB24: 50.0000 mg | ORAL_TABLET | Freq: Every day | ORAL | Status: DC

## 2012-06-12 NOTE — Telephone Encounter (Signed)
Refilled metoprolol and lisinopril

## 2012-07-08 ENCOUNTER — Encounter: Payer: Self-pay | Admitting: Family Medicine

## 2012-09-20 ENCOUNTER — Telehealth: Payer: Self-pay | Admitting: *Deleted

## 2012-09-20 ENCOUNTER — Telehealth: Payer: Self-pay | Admitting: Cardiology

## 2012-09-20 NOTE — Telephone Encounter (Signed)
Patient called back stating that he didn't want to go to emergency room and wanted to wait and see Dr Swaziland on Monday.   When questioned the patient about his symptoms asked if this was the way he felt prior to his last stent he stated yes but current symptoms worse.  Also reminded patient that he had told me in his previous call that he felt something just wasn't right and he agreed that he felt this way. Again advised patient he needed to go to the emergency department. Patient verbalized understanding

## 2012-09-20 NOTE — Telephone Encounter (Signed)
per request SOB on exhertion, weakness, burning in chest. Plz return call to pt at hm#

## 2012-09-20 NOTE — Telephone Encounter (Signed)
Patient complaining of chest tightness with the exertion and just not feeling right. Symptoms resolve after rest.  Patient stated this has been going on for a couple of weeks. Discussed with Dawayne Patricia NP and advised patient to go to the emergency department.  Patient verbalized understanding.

## 2012-09-20 NOTE — Telephone Encounter (Signed)
Shortness of breath, worse with exertions

## 2012-09-22 ENCOUNTER — Encounter (HOSPITAL_COMMUNITY): Payer: Self-pay | Admitting: Emergency Medicine

## 2012-09-22 ENCOUNTER — Inpatient Hospital Stay (HOSPITAL_COMMUNITY)
Admission: EM | Admit: 2012-09-22 | Discharge: 2012-09-25 | DRG: 247 | Disposition: A | Discharge status: Home / Self Care | Payer: 59 | Attending: Cardiology | Admitting: Cardiology

## 2012-09-22 ENCOUNTER — Encounter (HOSPITAL_COMMUNITY)
Admission: EM | Disposition: A | Discharge status: Home / Self Care | Payer: Self-pay | Source: Home / Self Care | Attending: Cardiology

## 2012-09-22 ENCOUNTER — Ambulatory Visit (HOSPITAL_COMMUNITY): Admit: 2012-09-22 | Payer: Self-pay | Admitting: Cardiology

## 2012-09-22 ENCOUNTER — Inpatient Hospital Stay (HOSPITAL_COMMUNITY): Payer: 59

## 2012-09-22 DIAGNOSIS — I422 Other hypertrophic cardiomyopathy: Secondary | ICD-10-CM | POA: Diagnosis present

## 2012-09-22 DIAGNOSIS — K219 Gastro-esophageal reflux disease without esophagitis: Secondary | ICD-10-CM | POA: Diagnosis present

## 2012-09-22 DIAGNOSIS — N183 Chronic kidney disease, stage 3 unspecified: Secondary | ICD-10-CM

## 2012-09-22 DIAGNOSIS — I498 Other specified cardiac arrhythmias: Secondary | ICD-10-CM | POA: Diagnosis present

## 2012-09-22 DIAGNOSIS — I2129 ST elevation (STEMI) myocardial infarction involving other sites: Secondary | ICD-10-CM

## 2012-09-22 DIAGNOSIS — Z8719 Personal history of other diseases of the digestive system: Secondary | ICD-10-CM

## 2012-09-22 DIAGNOSIS — I421 Obstructive hypertrophic cardiomyopathy: Secondary | ICD-10-CM

## 2012-09-22 DIAGNOSIS — I2510 Atherosclerotic heart disease of native coronary artery without angina pectoris: Secondary | ICD-10-CM

## 2012-09-22 DIAGNOSIS — I10 Essential (primary) hypertension: Secondary | ICD-10-CM | POA: Diagnosis present

## 2012-09-22 DIAGNOSIS — E785 Hyperlipidemia, unspecified: Secondary | ICD-10-CM | POA: Diagnosis present

## 2012-09-22 DIAGNOSIS — Z9861 Coronary angioplasty status: Secondary | ICD-10-CM

## 2012-09-22 DIAGNOSIS — N289 Disorder of kidney and ureter, unspecified: Secondary | ICD-10-CM | POA: Diagnosis present

## 2012-09-22 DIAGNOSIS — Z955 Presence of coronary angioplasty implant and graft: Secondary | ICD-10-CM

## 2012-09-22 DIAGNOSIS — I472 Ventricular tachycardia: Secondary | ICD-10-CM

## 2012-09-22 DIAGNOSIS — I219 Acute myocardial infarction, unspecified: Secondary | ICD-10-CM

## 2012-09-22 DIAGNOSIS — I213 ST elevation (STEMI) myocardial infarction of unspecified site: Secondary | ICD-10-CM

## 2012-09-22 DIAGNOSIS — E876 Hypokalemia: Secondary | ICD-10-CM | POA: Diagnosis not present

## 2012-09-22 HISTORY — PX: PERCUTANEOUS CORONARY STENT INTERVENTION (PCI-S): SHX5485

## 2012-09-22 HISTORY — PX: LEFT HEART CATH: SHX5478

## 2012-09-22 HISTORY — DX: Malignant (primary) neoplasm, unspecified: C80.1

## 2012-09-22 HISTORY — DX: Gastro-esophageal reflux disease without esophagitis: K21.9

## 2012-09-22 HISTORY — DX: Other hypertrophic cardiomyopathy: I42.2

## 2012-09-22 HISTORY — DX: Cardiac murmur, unspecified: R01.1

## 2012-09-22 LAB — TROPONIN I
Troponin I: 0.64 ng/mL (ref <0.30)
Troponin I: >20 ng/mL (ref <0.30)
Troponin I: >20 ng/mL (ref <0.30)

## 2012-09-22 LAB — BASIC METABOLIC PANEL WITH GFR
CO2: 26 meq/L (ref 19–32)
Chloride: 103 meq/L (ref 96–112)
Glucose, Bld: 100 mg/dL — ABNORMAL HIGH (ref 70–99)
Potassium: 3.6 meq/L (ref 3.5–5.1)
Sodium: 141 meq/L (ref 135–145)

## 2012-09-22 LAB — TSH: TSH: 2.353 u[IU]/mL (ref 0.350–4.500)

## 2012-09-22 LAB — CBC WITH DIFFERENTIAL/PLATELET
Basophils Absolute: 0 10*3/uL (ref 0.0–0.1)
Basophils Relative: 0 % (ref 0–1)
Eosinophils Absolute: 0.1 10*3/uL (ref 0.0–0.7)
Eosinophils Relative: 1 % (ref 0–5)
HCT: 46.9 % (ref 39.0–52.0)
Hemoglobin: 16.8 g/dL (ref 13.0–17.0)
Lymphocytes Relative: 27 % (ref 12–46)
Lymphs Abs: 2.1 K/uL (ref 0.7–4.0)
MCH: 30.4 pg (ref 26.0–34.0)
MCHC: 35.8 g/dL (ref 30.0–36.0)
MCV: 85 fL (ref 78.0–100.0)
Monocytes Absolute: 0.7 10*3/uL (ref 0.1–1.0)
Monocytes Relative: 10 % (ref 3–12)
Neutro Abs: 4.8 10*3/uL (ref 1.7–7.7)
Neutrophils Relative %: 62 % (ref 43–77)
Platelets: 206 K/uL (ref 150–400)
RBC: 5.52 MIL/uL (ref 4.22–5.81)
RDW: 12.8 % (ref 11.5–15.5)
WBC: 7.7 K/uL (ref 4.0–10.5)

## 2012-09-22 LAB — CREATININE, SERUM
GFR calc Af Amer: 71 mL/min — ABNORMAL LOW (ref >90)
GFR calc non Af Amer: 61 mL/min — ABNORMAL LOW (ref >90)

## 2012-09-22 LAB — APTT: aPTT: 33 s (ref 24–37)

## 2012-09-22 LAB — BASIC METABOLIC PANEL
BUN: 15 mg/dL (ref 6–23)
Calcium: 10.2 mg/dL (ref 8.4–10.5)
Creatinine, Ser: 1.35 mg/dL (ref 0.50–1.35)
GFR calc Af Amer: 63 mL/min — ABNORMAL LOW (ref >90)
GFR calc non Af Amer: 54 mL/min — ABNORMAL LOW (ref >90)

## 2012-09-22 LAB — CBC
HCT: 42.8 % (ref 39.0–52.0)
Platelets: 170 10*3/uL (ref 150–400)
RBC: 5.09 MIL/uL (ref 4.22–5.81)
RDW: 12.7 % (ref 11.5–15.5)
WBC: 13.6 10*3/uL — ABNORMAL HIGH (ref 4.0–10.5)

## 2012-09-22 LAB — PROTIME-INR
INR: 1.08 (ref 0.00–1.49)
Prothrombin Time: 13.9 s (ref 11.6–15.2)

## 2012-09-22 LAB — MRSA PCR SCREENING: MRSA by PCR: NEGATIVE

## 2012-09-22 SURGERY — LEFT HEART CATH
Anesthesia: LOCAL | Site: Hand | Laterality: Right

## 2012-09-22 MED ORDER — LIDOCAINE HCL (PF) 1 % IJ SOLN
INTRAMUSCULAR | Status: AC
  Filled 2012-09-22: qty 30

## 2012-09-22 MED ORDER — LISINOPRIL 10 MG PO TABS
10.0000 mg | ORAL_TABLET | Freq: Every day | ORAL | Status: DC
  Administered 2012-09-23 – 2012-09-25 (×3): 10 mg via ORAL
  Filled 2012-09-22 (×3): qty 1

## 2012-09-22 MED ORDER — FENTANYL CITRATE 0.05 MG/ML IJ SOLN
INTRAMUSCULAR | Status: AC
  Filled 2012-09-22: qty 2

## 2012-09-22 MED ORDER — HEPARIN SODIUM (PORCINE) 5000 UNIT/ML IJ SOLN
5000.0000 [IU] | Freq: Three times a day (TID) | INTRAMUSCULAR | Status: DC
  Administered 2012-09-22 – 2012-09-25 (×8): 5000 [IU] via SUBCUTANEOUS
  Filled 2012-09-22 (×11): qty 1

## 2012-09-22 MED ORDER — ONDANSETRON HCL 4 MG/2ML IJ SOLN
4.0000 mg | Freq: Once | INTRAMUSCULAR | Status: AC
  Administered 2012-09-22: 4 mg via INTRAVENOUS

## 2012-09-22 MED ORDER — TICAGRELOR 90 MG PO TABS
ORAL_TABLET | ORAL | Status: AC
  Filled 2012-09-22: qty 2

## 2012-09-22 MED ORDER — AMLODIPINE BESYLATE 10 MG PO TABS
10.0000 mg | ORAL_TABLET | Freq: Every day | ORAL | Status: DC
  Administered 2012-09-23 – 2012-09-25 (×3): 10 mg via ORAL
  Filled 2012-09-22 (×3): qty 1

## 2012-09-22 MED ORDER — HEPARIN BOLUS VIA INFUSION
4000.0000 [IU] | Freq: Once | INTRAVENOUS | Status: AC
  Administered 2012-09-22: 4000 [IU] via INTRAVENOUS

## 2012-09-22 MED ORDER — NITROGLYCERIN IN D5W 200-5 MCG/ML-% IV SOLN
INTRAVENOUS | Status: AC
  Filled 2012-09-22: qty 250

## 2012-09-22 MED ORDER — NIACIN 500 MG PO TABS
500.0000 mg | ORAL_TABLET | Freq: Two times a day (BID) | ORAL | Status: DC
  Administered 2012-09-22 – 2012-09-23 (×2): 500 mg via ORAL
  Filled 2012-09-22 (×4): qty 1

## 2012-09-22 MED ORDER — VERAPAMIL HCL 2.5 MG/ML IV SOLN
INTRAVENOUS | Status: AC
  Filled 2012-09-22: qty 2

## 2012-09-22 MED ORDER — NITROGLYCERIN IN D5W 200-5 MCG/ML-% IV SOLN
2.0000 ug/min | INTRAVENOUS | Status: DC
  Administered 2012-09-22: 5 ug/min via INTRAVENOUS

## 2012-09-22 MED ORDER — MIDAZOLAM HCL 2 MG/2ML IJ SOLN
INTRAMUSCULAR | Status: AC
  Filled 2012-09-22: qty 2

## 2012-09-22 MED ORDER — TICAGRELOR 90 MG PO TABS
90.0000 mg | ORAL_TABLET | Freq: Two times a day (BID) | ORAL | Status: DC
  Administered 2012-09-22 – 2012-09-25 (×6): 90 mg via ORAL
  Filled 2012-09-22 (×7): qty 1

## 2012-09-22 MED ORDER — ACETAMINOPHEN 325 MG PO TABS
650.0000 mg | ORAL_TABLET | ORAL | Status: DC | PRN
  Administered 2012-09-23: 650 mg via ORAL
  Filled 2012-09-22: qty 2

## 2012-09-22 MED ORDER — METOPROLOL SUCCINATE ER 50 MG PO TB24
50.0000 mg | ORAL_TABLET | Freq: Every day | ORAL | Status: DC
  Administered 2012-09-23 – 2012-09-25 (×3): 50 mg via ORAL
  Filled 2012-09-22 (×3): qty 1

## 2012-09-22 MED ORDER — FENTANYL CITRATE 0.05 MG/ML IJ SOLN
50.0000 ug | Freq: Once | INTRAMUSCULAR | Status: AC
  Administered 2012-09-22: 50 ug via INTRAVENOUS
  Filled 2012-09-22: qty 2

## 2012-09-22 MED ORDER — PANTOPRAZOLE SODIUM 40 MG PO TBEC
40.0000 mg | DELAYED_RELEASE_TABLET | Freq: Every day | ORAL | Status: DC
  Administered 2012-09-23 – 2012-09-25 (×3): 40 mg via ORAL
  Filled 2012-09-22 (×3): qty 1

## 2012-09-22 MED ORDER — ASPIRIN 81 MG PO CHEW
CHEWABLE_TABLET | ORAL | Status: AC
  Administered 2012-09-22: 243 mg
  Filled 2012-09-22: qty 3

## 2012-09-22 MED ORDER — ASPIRIN 81 MG PO TABS
81.0000 mg | ORAL_TABLET | Freq: Every day | ORAL | Status: DC
  Administered 2012-09-23: 81 mg via ORAL
  Filled 2012-09-22 (×2): qty 1

## 2012-09-22 MED ORDER — SODIUM CHLORIDE 0.9 % IV SOLN
INTRAVENOUS | Status: AC
  Administered 2012-09-22: 21:00:00 via INTRAVENOUS

## 2012-09-22 MED ORDER — ONDANSETRON HCL 4 MG/2ML IJ SOLN
4.0000 mg | Freq: Four times a day (QID) | INTRAMUSCULAR | Status: DC | PRN
  Filled 2012-09-22: qty 2

## 2012-09-22 MED ORDER — ONDANSETRON HCL 4 MG/2ML IJ SOLN
INTRAMUSCULAR | Status: AC
  Administered 2012-09-22: 4 mg via INTRAVENOUS
  Filled 2012-09-22: qty 2

## 2012-09-22 MED ORDER — NITROGLYCERIN 0.2 MG/ML ON CALL CATH LAB
INTRAVENOUS | Status: AC
  Filled 2012-09-22: qty 1

## 2012-09-22 MED ORDER — BIVALIRUDIN 250 MG IV SOLR
INTRAVENOUS | Status: AC
  Filled 2012-09-22: qty 250

## 2012-09-22 NOTE — Progress Notes (Signed)
Pt received from cath lab.Small hematoma noted superior to TR band( Dr aware).The hematoma was outlined in black marker then pulse-ox was placed to Rt thumb .Info and reassurance was provided to Pt.

## 2012-09-22 NOTE — Progress Notes (Signed)
Pt's family  Interrupted report to state that Pt was nauseated.B/P was taken by this Clinical research associate while nurse about to receive Pt went to obtain zofran.Pt appeared  somewhat pale ,denied c/p ,pressure and tightness.Pt also denied SOB.No diaphoresis noted.B/P read 65/44  ,the HR = 51 bpm. The Pt was then placed in mild trendelenberg after the NTG drip and carrier were immediately stopped.The E-link button was depressed.The b/p was recycled. E-Link Dr was given update.  NS (@ 83ml/hr)was resumed in alternate PIV and zofran was admin.Hr and b/p became stable by 19:48 pm. VS section .No additional meds or IVF given during this event.

## 2012-09-22 NOTE — Progress Notes (Signed)
Called into pts room with day shift RN by family, pt complaining of nausea.  Pts blood pressure noted very low 64/44 HR also noted to be in the 40s.  NTG gtt currently going at .  NTG turned off, 4 mg zofran given for nausea. EKG obtained, no acute changes. No complaints of chest pain during the episode.The episode resolved quickly, with pt saying he was feeling better BP returned to normal 115/68 @1951 .  Cardiology paged and spoke with day shift RN, will cont to monitor closely through the night and to page with any changes.

## 2012-09-22 NOTE — Progress Notes (Signed)
Dr Donnie Aho informed of Pt's c/o  Intermittent sharp pain ( "5 " on pain scale ) with a constant dull pain ("1" on pain scale)to mid chest area . 12-lead EKG done .Dr updated on VS and freq runs of PVC bigeminy that coincided w/Pt's pain .Order for NTG gtt received.

## 2012-09-22 NOTE — CV Procedure (Signed)
Cardiac Catheterization Procedure Note  Name: Brandon Gonzales MRN: [id number] DOB: 1951/01/28  Procedure: Left Heart Cath, Selective Coronary Angiography, LV angiography, PTCA and stenting of the mid to distal circumflex and proximal LAD.  Indication: 61 year old white male with history of coronary disease status post stenting of the mid LAD with a bare-metal stent and angioplasty of the diagonal branch in 2007. He presents now with an acute lateral STEMI. ECG shows ST elevation of 2 mm in leads V5 and V6 with 1 mm of ST elevation in lead aVL.  Procedural Details:  The right wrist was prepped, draped, and anesthetized with 1% lidocaine. Using the modified Seldinger technique, a 6 French sheath was introduced into the right radial artery. 3 mg of verapamil was administered through the sheath, weight-based unfractionated heparin was administered intravenously. Standard Judkins catheters were used for selective coronary angiography and left ventriculography. Catheter exchanges were performed over an exchange length guidewire.  PROCEDURAL FINDINGS Hemodynamics: AO 120/69 with a mean of 87 mmHg LV 130/30 mmHg   Coronary angiography: Coronary dominance: right  Left mainstem: Normal.  Left anterior descending (LAD): The left anterior descending artery has a focal 95-99% stenosis in the proximal vessel. This is proximal to the prior stent in the mid vessel which is widely patent. The diagonal is widely patent.  Left circumflex (LCx): The left circumflex gives rise to a small to moderate sized first marginal branch. It is then occluded in the mid to distal vessel which comprises a second marginal branch.  Right coronary artery (RCA): The right coronary is a large dominant vessel which has only mild irregularities less than 10%.  Left ventriculography: Left ventricular angiography was performed at the end of the procedure. This demonstrates normal left ventricle or size. There is severe  hypokinesis of the mid to distal anterior wall and apex. Overall ejection fraction is estimated at 40-45%. There is no significant mitral insufficiency.  PCI Note:  Following the diagnostic procedure, the decision was made to proceed with PCI.  Weight-based bivalirudin was given for anticoagulation. Brilinta 180 mg was given PO. Once a therapeutic ACT was achieved, a 6 [place] XB LAD 3.5 guide catheter was inserted.  A pro-water coronary guidewire was used to cross the lesion in the left circumflex.  The lesion was predilated with a 2.0 mm balloon. With reperfusion there was noted to be a long lesion in the mid circumflex extending into the second marginal branch. The lesion was then stented with a 2.5 x 28 mm Promus stent.  The stent was postdilated with a 2.5 mm noncompliant balloon.  Following PCI, there was 0% residual stenosis and TIMI-3 flow.   At this point the patient's ST segment elevation had resolved. His chest pain had improved but was still present. The proximal LAD lesion was critical and I felt that it would be most prudent to go ahead and treat this lesion as well. The lesion was crossed with the same pro-water wire. It was predilated with a 2 mm balloon. We then stented the lesion with a 3.0 x 16 mm Promus stent. This was postdilated with a 3.25 mm noncompliant balloon. Following PCI there was 0% residual stenosis and TIMI grade 3 flow. Patient was pain free at this point. Final angiography confirmed an excellent result. The patient tolerated the procedure well. There were no immediate procedural complications. A TR band was used for radial hemostasis. The patient was transferred to the post catheterization recovery area for further monitoring.  PCI Data: Vessel -  left circumflex/Segment - mid to distal Percent Stenosis (pre)  100% TIMI-flow 0 Stent 2.5 x 28 mm Promus Percent Stenosis (post) 0% TIMI-flow (post) 3  Vessel #2-LAD/proximal Percent stenosis (pre-) 95% TIMI flow 3 Stent  3.0 x 16 mm Promus Percent stenoses (post) 0% TIMI flow (post) 3  Final Conclusions:   1. 2 vessel obstructive coronary disease. The culprit lesion was the mid to distal circumflex. Patient also had a critical proximal LAD stenosis. Prior stent in the mid LAD was patent. 2. Moderate left ventricular dysfunction. 3. Successful intracoronary stenting of the mid to distal left circumflex and the proximal LAD with drug-eluting stents.   Recommendations:  Recommend dual antiplatelet therapy for at least one year. Patient is statin intolerance. We'll continue niacin. Assess lipid panel. We will obtain an echocardiogram. I would anticipate a 3 day hospital stay depending on his clinical course.  Theron Arista [facility] 09/22/2012, 3:23 PM

## 2012-09-22 NOTE — ED Provider Notes (Signed)
History     CSN: [id number]  Arrival date & time 09/22/12  1310   First MD Initiated Contact with Patient 09/22/12 1332      Chief Complaint  Patient presents with  . Chest Pain    (Consider location/radiation/quality/duration/timing/severity/associated sxs/prior treatment) HPI Comments: Level 5 caveat due to severity of symptoms.  Pt with known CAD with 1 stent by Dr. Swaziland years ago, reports feeling lightheaded and more SOB for past 2 weeks.   About 30 minutes ago, got severe lower chest pain across, went into back some as well.  Feels dizzy, no nausea, unsure of sweating.  No cough or cold symptoms.  Took all of his usual meds this AM including a baby aspirin, and metoprolol.    Patient is a 61 y.o. male presenting with chest pain. The history is provided by the patient.  Chest Pain     Past Medical History  Diagnosis Date  . Hypertension   . Dyslipidemia     Statin-intolerant.  . H/O gastroesophageal reflux (GERD)   . Arthritis   . Coronary artery disease 2007    a. Stent to LAD,  balloon angioplasty of D1 in 2007.    Past Surgical History  Procedure Date  . Knee surgery 1993    left  . Elbow surgery 1996    right  . Cardiac catheterization 2007    stent to LAD and PTCA of diagonal branch  . Cardiac catheterization 2010  . Coronary stent placement   . Coronary angioplasty     Family History  Problem Relation Age of Onset  . Heart attack Mother   . Heart disease Mother     History  Substance Use Topics  . Smoking status: Never Smoker   . Smokeless tobacco: Not on file  . Alcohol Use: No      Review of Systems  Unable to perform ROS: Other  Cardiovascular: Positive for chest pain.    Allergies  Antihistamines, chlorpheniramine-type; Statins; and Zetia  Home Medications   Current Outpatient Rx  Name Route Sig Dispense Refill  . AMLODIPINE BESYLATE 10 MG PO TABS Oral Take 10 mg by mouth daily.    . ASPIRIN 81 MG PO TABS Oral Take 81 mg by  mouth daily.    [name] Kitchen LISINOPRIL 10 MG PO TABS Oral Take 1 tablet (10 mg total) by mouth daily. 90 tablet 3  . METOPROLOL SUCCINATE ER 50 MG PO TB24 Oral Take 1 tablet (50 mg total) by mouth daily. Take with or immediately following a meal. 90 tablet 3  . NIACIN 500 MG PO TABS Oral Take 500 mg by mouth 2 (two) times daily with a meal.    . PRILOSEC OTC PO Oral Take by mouth.      There were no vitals taken for this visit.  Physical Exam  Nursing note and vitals reviewed. Constitutional: He is oriented to person, place, and time. He appears well-developed and well-nourished. He appears distressed.  HENT:  Head: Normocephalic and atraumatic.  Eyes: Pupils are equal, round, and reactive to light.  Neck: Normal range of motion. Neck supple.  Cardiovascular: Normal rate, regular rhythm, S1 normal and intact distal pulses.   No murmur heard. Pulmonary/Chest: Tachypnea noted. He has no decreased breath sounds. He has no wheezes. He has no rhonchi.  Abdominal: Soft. He exhibits no distension. There is no tenderness. There is no rebound.  Musculoskeletal: He exhibits no edema.  Neurological: He is alert and oriented to person, place, and  time. No cranial nerve deficit.  Skin: Skin is warm. No rash noted. He is diaphoretic. No cyanosis. There is pallor.  Psychiatric: His speech is normal. His mood appears anxious. Cognition and memory are normal.    ED Course  Procedures (including critical care time)   CRITICAL CARE Performed by: Lear Ng.   Total critical care time: 30 min  Critical care time was exclusive of separately billable procedures and treating other patients.  Critical care was necessary to treat or prevent imminent or life-threatening deterioration.  Critical care was time spent personally by me on the following activities: development of treatment plan with patient and/or surrogate as well as nursing, discussions with consultants, evaluation of patient's response to  treatment, examination of patient, obtaining history from patient or surrogate, ordering and performing treatments and interventions, ordering and review of laboratory studies, ordering and review of radiographic studies, pulse oximetry and re-evaluation of patient's condition.    Labs Reviewed  CBC WITH DIFFERENTIAL  BASIC METABOLIC PANEL  TROPONIN I  APTT  PROTIME-INR   No results found.   1. STEMI (ST elevation myocardial infarction)     RA sat is 99% and I interpret to be normal   ECG at time 13:14 shows SR at rate 91, normal axis, ST eelvation inferior leads with reciprocal ST depression septal and anterior leads.  ST elevation also noted in lateral leads V5-6.  Non specific IVC delay.  ST changes new compared to ECG on 03/08/12.    MDM  Pt's symptoms concerning for ACS.   ECG suggestive of inf and lateral STEMI. Code STEMI called, heparin ordered, ASA, fentanyl and zofran for symptoms.      Pt's cardiologist is Dr. Swaziland.          Gavin Pound. Angelys Yetman, MD 09/22/12 1401

## 2012-09-22 NOTE — Plan of Care (Signed)
Problem: Consults Goal: Cardiac Cath Patient Education (See Patient Education module for education specifics.) Outcome: Progressing Routine post card cath teaching done

## 2012-09-22 NOTE — ED Notes (Signed)
Pt c/o midsternal chest pain onset x 2 weeks. EKG in triage shows possible STEMI.

## 2012-09-22 NOTE — H&P (Signed)
History and Physical  Patient ID: Brandon Gonzales MRN: [id number], DOB: 11/29/1951 Date of Encounter: 09/22/2012, 1:58 PM Primary Physician: Johny Blamer, MD Primary Cardiologist: [place]  Chief Complaint: chest pain Reason for Admit: STEMI  HPI: Brandon Gonzales is a 61 y/o M with hx HTN, statin-intolerant dyslipidemia, GERD and CAD s/p PCI 2007 who has been having intermittent dyspnea for 2 weeks. Two days ago he called the office due to chest tightness with exertion and just not feeling right. Symptoms would resolve with rest. He was advised to go to the ER but he elected not to. This morning he developed severe substernal chest pain, SOB and weakness. No nausea. He took his usual AM meds today including baby ASA then came to the ER. He was given 3 additional baby ASA. EKG demonstrated NSR with significant lat ST elevation with slight inferior change as well, reciprocal changes present. He received heparin bolus, fentanyl, and zofran and was taken emergently up to the cath lab. He states, "I have never had pain like this before."  Past Medical History  Diagnosis Date  . Hypertension   . Dyslipidemia     Statin-intolerant.  . H/O gastroesophageal reflux (GERD)   . Arthritis   . Coronary artery disease 2007    a. Stent to LAD,  balloon angioplasty of D1 in 2007.     Most Recent Cardiac Studies: Cath 07/2009 INDICATIONS FOR PROCEDURE: The patient is a 60 year old white male with  known history of coronary artery disease status post stenting of the LAD  and angioplasty of the diagonal branch in 2007. He has a history of  hypertension and dyslipidemia. He presents with symptoms of refractory  dyspnea on exertion. This has been progressive. Given these findings,  cardiac catheterization was recommended.  ACCESS: Via the right femoral artery and vein using standard Seldinger  technique.  EQUIPMENT: A 6-French 4 cm right and left Judkins catheter, 6-French  pigtail catheter, 6-French  arterial sheath, 7-French venous sheath, 7-  French balloon-tip Swan-Ganz catheter.  PROCEDURES: Right and left heart catheterization, coronary and left  ventricular angiography.  MEDICATIONS: Local anesthesia 1% Xylocaine, Versed 2 mg IV.  CONTRAST: Omnipaque 100 mL.  HEMODYNAMIC DATA: Right atrial pressure is 8/5 with a mean of 4 mmHg.  Right ventricular pressure is 27 with an EDP of 5 mmHg. Pulmonary  artery pressures 25/9 with a mean of 16 mmHg. Pulmonary capillary wedge  pressure is 13/10 with a mean of 9 mmHg. Aortic pressure is 114/66 with  a mean of 87 mmHg. Left ventricular pressure is 110 with an EDP of 10  mmHg. There is no mitral or aortic valve gradient. Oxygen saturation  is 98% in the aorta and 77% in the pulmonary artery. Cardiac output is  5.66 L/min with an index of 3.03.  ANGIOGRAPHIC DATA: The left coronary artery arises and distributes  normally. The left main coronary artery is normal.  The left anterior descending artery has 20-30% narrowing in the proximal  vessel. There is a long stent in the proximal to mid vessel that is  widely patent and has less than 20% irregularities. The remainder of  the LAD is without significant disease. There is a diagonal of moderate  size arising from within the stent. There is approximately 20% ostial  stenosis in the diagonal.  The left circumflex coronary artery gives rise to a moderate first and  third obtuse marginal branches and a very tiny second obtuse marginal  vessel. There is a segmental 50-60%  stenosis in the mid circumflex.  The very tiny second marginal branch has a 90% ostial stenosis.  The right coronary artery is a large dominant vessel. It appears normal  throughout its course.  Left ventricular angiography performed in the RAO view demonstrates  normal left ventricular size and contractility with normal systolic  function. Ejection fraction is estimated at 65%. There was no mitral  regurgitation or prolapse.    FINAL INTERPRETATION:  1. Nonobstructive atherosclerotic coronary artery disease. The stent  in the LAD is still widely patent. There is moderate mid  circumflex disease that is unchanged from 2007.  2. Normal left ventricular function.  3. Normal right heart pressures.  PLAN: Recommend continued medical therapy.   Surgical History:  Past Surgical History  Procedure Date  . Knee surgery 1993    left  . Elbow surgery 1996    right  . Cardiac catheterization 2007    stent to LAD and PTCA of diagonal branch  . Cardiac catheterization 2010  . Coronary stent placement   . Coronary angioplasty      Home Meds: Prior to Admission medications   Medication Sig Start Date End Date Taking? Authorizing Provider  amLODipine (NORVASC) 10 MG tablet Take 10 mg by mouth daily. 01/11/12 01/10/13  Peter M [place], MD  aspirin 81 MG tablet Take 81 mg by mouth daily.    Historical Provider, MD  lisinopril (PRINIVIL,ZESTRIL) 10 MG tablet Take 1 tablet (10 mg total) by mouth daily. 06/12/12   Peter M [place], MD  metoprolol succinate (TOPROL-XL) 50 MG 24 hr tablet Take 1 tablet (50 mg total) by mouth daily. Take with or immediately following a meal. 06/12/12   Peter M [place], MD  niacin 500 MG tablet Take 500 mg by mouth 2 (two) times daily with a meal.    Historical Provider, MD  Omeprazole Magnesium (PRILOSEC OTC PO) Take by mouth.    Historical Provider, MD    Allergies:  Allergies  Allergen Reactions  . Antihistamines, Chlorpheniramine-Type Other (See Comments)    Altered mental status  . Statins   . Zetia (Ezetimibe)     History   Social History  . Marital Status: Married    Spouse Name: N/A    Number of Children: N/A  . Years of Education: N/A   Occupational History  . Not on file.   Social History Main Topics  . Smoking status: Never Smoker   . Smokeless tobacco: Not on file  . Alcohol Use: No  . Drug Use: No  . Sexually Active:    Other Topics Concern  . Not on file   Social  History Narrative  . No narrative on file     Family History  Problem Relation Age of Onset  . Heart attack Mother   . Heart disease Mother     Review of Systems: limited due to acuity of situation General: negative for chills, fever Cardiovascular: see above Dermatological: negative for rash Respiratory: negative for wheezing Urologic: negative for hematuria Abdominal: negative for nausea, vomiting Neurologic: negative for visual changes, syncope. Feeling weak, lightheaded All other systems reviewed and are otherwise negative except as noted above.  Labs: pending Radiology/Studies:  No results found.   EKG:  NSR with significant lateral ST elevation up to 2mm in V5-V6, mild ST elevation II, III, avF  Physical Exam: P73, BP 157/105, 97% 2L, RR14 General: Well developed, well nourished WM uncomfortable appearing Head: Normocephalic, atraumatic, sclera non-icteric, no xanthomas, nares are without discharge.  Neck: JVD not elevated. Lungs: Clear anteriorly bilaterally to auscultation without wheezes, rales, or rhonchi. Breathing is unlabored. Heart: RRR with S1 S2. No murmurs, rubs, or gallops appreciated. Abdomen: Soft, non-tender, non-distended with normoactive bowel sounds. No hepatomegaly. No rebound/guarding. No obvious abdominal masses. Msk:  Strength and tone appear normal for age. Extremities: No clubbing or cyanosis. No edema.  Distal pedal pulses are 2+ and equal bilaterally. Neuro: Alert and oriented X 3. No focal deficit. No facial asymmetry.  Psych:  Responds to questions appropriately with anxious affect.    ASSESSMENT AND PLAN:   1. Acute STEMI with hx of known CAD 2. HTN 3. Dyslipidemia, statin intolerant 4. H/o GERD  The patient is undergoing emergent cardiac catheterization. Will admit to CCU, cycle enzymes. Will tentatively write for Norvasc, Lisinopril, Niacin, PPI, Toprol to be continued on admission.  Further recommendations including additional  antiplatelet medication to be ordered by Dr. [place] pending cath results.  Signed, Ronie Spies PA-C 09/22/2012, 1:58 PM  Patient seen and examined and history reviewed. Agree with above findings and plan. Patient presents with a several day history of exertional chest pain and dyspnea on exertion. Today he developed acute onset of severe substernal chest pain at rest. ECG demonstrates findings of an acute lateral ST elevation myocardial infarction. He has severe ongoing chest pain. He is hypertensive. We will proceed with emergent cardiac catheterization and intervention. Examination is as noted above.  Theron Arista [facility] 09/22/2012 3:35 PM

## 2012-09-22 NOTE — Progress Notes (Signed)
Chaplain reported immediately to the Cath Lab after receiving a Code Semi page. Chaplain waited until patient was brought from ED Pod 33 to the Cath Lab. Chaplain said a silent prayer before procedure was started. Chaplain went and stayed with wife in the waiting area. Chaplain provided compassionate ministry of presence and shared words of encouragement and comfort with family members. Chaplain served as Print production planner between family and Cath Lab staff during the procedure and kept wife updated on the progress of the procedure. Chaplain escorted family members to visit with patient in Room 2914 after Cath Lab procedure. Family members and patient expressed their appreciation for Chaplain's visit and spiritual presence. Chaplain will continue to visit and provide spiritual care to both patient and family members as needed at a later time.

## 2012-09-23 ENCOUNTER — Encounter (HOSPITAL_COMMUNITY): Payer: Self-pay | Admitting: General Practice

## 2012-09-23 DIAGNOSIS — I219 Acute myocardial infarction, unspecified: Secondary | ICD-10-CM

## 2012-09-23 DIAGNOSIS — I2510 Atherosclerotic heart disease of native coronary artery without angina pectoris: Secondary | ICD-10-CM

## 2012-09-23 DIAGNOSIS — I059 Rheumatic mitral valve disease, unspecified: Secondary | ICD-10-CM

## 2012-09-23 LAB — CBC
HCT: 40.7 % (ref 39.0–52.0)
Hemoglobin: 14.6 g/dL (ref 13.0–17.0)
MCV: 83.7 fL (ref 78.0–100.0)
RBC: 4.86 MIL/uL (ref 4.22–5.81)
WBC: 10.1 10*3/uL (ref 4.0–10.5)

## 2012-09-23 LAB — COMPREHENSIVE METABOLIC PANEL
Albumin: 3.7 g/dL (ref 3.5–5.2)
BUN: 11 mg/dL (ref 6–23)
Creatinine, Ser: 1.24 mg/dL (ref 0.50–1.35)
Total Protein: 5.9 g/dL — ABNORMAL LOW (ref 6.0–8.3)

## 2012-09-23 LAB — LIPID PANEL
HDL: 27 mg/dL — ABNORMAL LOW (ref >39)
LDL Cholesterol: 130 mg/dL — ABNORMAL HIGH (ref 0–99)
Total CHOL/HDL Ratio: 7.5 RATIO
VLDL: 46 mg/dL — ABNORMAL HIGH (ref 0–40)

## 2012-09-23 MED ORDER — POTASSIUM CHLORIDE CRYS ER 20 MEQ PO TBCR
40.0000 meq | EXTENDED_RELEASE_TABLET | Freq: Once | ORAL | Status: AC
  Administered 2012-09-23: 40 meq via ORAL
  Filled 2012-09-23: qty 2

## 2012-09-23 MED ORDER — NIACIN ER 500 MG PO CPCR
500.0000 mg | ORAL_CAPSULE | Freq: Two times a day (BID) | ORAL | Status: DC
  Administered 2012-09-23 – 2012-09-25 (×4): 500 mg via ORAL
  Filled 2012-09-23 (×7): qty 1

## 2012-09-23 MED FILL — Dextrose Inj 5%: INTRAVENOUS | Qty: 50 | Status: AC

## 2012-09-23 NOTE — Progress Notes (Signed)
CARDIAC REHAB PHASE I   PRE:  Rate/Rhythm: 96 SR  BP:  Supine:   Sitting: 128/80  Standing:    SaO2:   MODE:  Ambulation: 700 ft   POST:  Rate/Rhythem: 99 SR  BP:  Supine:   Sitting: 121/72  Standing:    SaO2:  1000-1110 Pt tolerated ambulation well without c/o of cp or SOB. VS stable. Started MI education with pt. He voices understanding. Pt agrees to Outpt. CRP in [place], will send referral. Will follow pt tomorrow to continue education.  Beatrix Fetters

## 2012-09-23 NOTE — Plan of Care (Signed)
Problem: Phase II Progression Outcomes Goal: Vascular site scale level 0 - I Vascular Site Scale Level 0: No bruising/bleeding/hematoma Level I (Mild): Bruising/Ecchymosis, minimal bleeding/ooozing, palpable hematoma < 3 cm Level II (Moderate): Bleeding not affecting hemodynamic parameters, pseudoaneurysm, palpable hematoma > 3 cm  Outcome: Progressing Right radial level 2 with bruising and hematoma above radial stick.  Patient states hematoma is getting better

## 2012-09-23 NOTE — Progress Notes (Signed)
  Echocardiogram 2D Echocardiogram has been performed.  Cathie Beams 09/23/2012, 9:42 AM

## 2012-09-23 NOTE — Progress Notes (Signed)
SUBJECTIVE: No chest pain this am. No SOB.   BP 131/78  Pulse 59  Temp 98 F (36.7 C) (Oral)  Resp 18  Ht 5\' 8"  (1.727 m)  Wt 173 lb 11.6 oz (78.8 kg)  BMI 26.41 kg/m2  SpO2 97%  Intake/Output Summary (Last 24 hours) at 09/23/12 0743 Last data filed at 09/23/12 0700  Gross per 24 hour  Intake 1103.65 ml  Output   2105 ml  Net -1001.35 ml    PHYSICAL EXAM General: Well developed, well nourished, in no acute distress. Alert and oriented x 3.  Psych:  Good affect, responds appropriately Neck: No JVD. No masses noted.  Lungs: Clear bilaterally with no wheezes or rhonci noted.  Heart: RRR with systolic murmur noted. Abdomen: Bowel sounds are present. Soft, non-tender.  Extremities: No lower extremity edema. Right wrist cath site ok. No hematoma.   LABS: Basic Metabolic Panel:  Basename 09/23/12 0254 09/22/12 1601 09/22/12 1357  NA 138 -- 141  K 3.5 -- 3.6  CL 105 -- 103  CO2 24 -- 26  GLUCOSE 107* -- 100*  BUN 11 -- 15  CREATININE 1.24 1.22 --  CALCIUM 9.0 -- 10.2  MG -- -- --  PHOS -- -- --   CBC:  Basename 09/23/12 0254 09/22/12 1601 09/22/12 1357  WBC 10.1 13.6* --  NEUTROABS -- -- 4.8  HGB 14.6 15.5 --  HCT 40.7 42.8 --  MCV 83.7 84.1 --  PLT 167 170 --   Cardiac Enzymes:  Basename 09/23/12 0253 09/22/12 2204 09/22/12 1600  CKTOTAL -- -- --  CKMB -- -- --  CKMBINDEX -- -- --  TROPONINI >20.00* >20.00* >20.00*   Fasting Lipid Panel:  Basename 09/23/12 0254  CHOL 203*  HDL 27*  LDLCALC 130*  TRIG 231*  CHOLHDL 7.5  LDLDIRECT --    Current Meds:    . amLODipine  10 mg Oral Daily  . aspirin      . aspirin  81 mg Oral Daily  . bivalirudin      . fentaNYL      . fentaNYL  50 mcg Intravenous Once  . heparin  4,000 Units Intravenous Once  . heparin  5,000 Units Subcutaneous Q8H  . lidocaine      . lisinopril  10 mg Oral Daily  . metoprolol succinate  50 mg Oral Daily  . midazolam      . niacin  500 mg Oral BID WC  . nitroGLYCERIN       . ondansetron (ZOFRAN) IV  4 mg Intravenous Once  . pantoprazole  40 mg Oral Daily  . Ticagrelor      . Ticagrelor  90 mg Oral BID  . verapamil      . DISCONTD: nitroGLYCERIN      . DISCONTD: nitroGLYCERIN       Cardiac Cath 09/22/12:  Left mainstem: Normal.  Left anterior descending (LAD): The left anterior descending artery has a focal 95-99% stenosis in the proximal vessel. This is proximal to the prior stent in the mid vessel which is widely patent. The diagonal is widely patent.  Left circumflex (LCx): The left circumflex gives rise to a small to moderate sized first marginal branch. It is then occluded in the mid to distal vessel which comprises a second marginal branch.  Right coronary artery (RCA): The right coronary is a large dominant vessel which has only mild irregularities less than 10%.  Left ventriculography: Left ventricular angiography was performed at the  end of the procedure. This demonstrates normal left ventricle or size. There is severe hypokinesis of the mid to distal anterior wall and apex. Overall ejection fraction is estimated at 40-45%. There is no significant mitral insufficiency.   ASSESSMENT AND PLAN:  1. STEMI:  Pt admitted with STEMI 09/22/12. Now s/p DES x 1 mid Circumflex into OM2, and s/p DES x 1 proximal LAD. Will continue ASA and Brilinta for at least one year. He is statin intolerant. Continue beta blocker, Ace-inh and Niacin. Check echo today. Cardiac rehab. Transfer to telemetry today.   2. Hypokalemia: Replace potassium today.   Brandon Gonzales,Brandon Gonzales  10/21/20137:43 AM

## 2012-09-23 NOTE — Care Management Note (Addendum)
    Page 1 of 1   09/25/2012     10:17:33 AM   CARE MANAGEMENT NOTE 09/25/2012  Patient:  Brandon Gonzales, Brandon Gonzales   Account Number:  [id number]  Date Initiated:  09/23/2012  Documentation initiated by:  Brandon Gonzales  Subjective/Objective Assessment:   adm w mi     Action/Plan:   lives w wife, pcp dr Brandon Gonzales   Anticipated DC Date:     Anticipated DC Plan:        DC Planning Services  CM consult      Choice offered to / List presented to:             Status of service:  Completed, signed off Medicare Important Message given?   (If response is "NO", the following Medicare IM given date fields will be blank) Date Medicare IM given:   Date Additional Medicare IM given:    Discharge Disposition:  HOME/SELF CARE  Per UR Regulation:  Reviewed for med. necessity/level of care/duration of stay  If discussed at Long Length of Stay Meetings, dates discussed:    Comments:  09-25-12 [address]Brandon Gonzales, [place] [phone] CM did speak to pt and he ha lost the brilinta card. New card issued and CM did call pharmacy to make sure medicaiton is available. MD please write rx for 30 day free no refills. Thanks   10/21 11:19a Brandon dowell rn,bsn [phone] gave pt brilinta 30day free card and copay assist card. has 50.00 copay for brilinta but has prior auth req at [phone]. will leave sticky note for md.

## 2012-09-24 LAB — BASIC METABOLIC PANEL
BUN: 17 mg/dL (ref 6–23)
CO2: 25 mEq/L (ref 19–32)
Calcium: 9.7 mg/dL (ref 8.4–10.5)
Chloride: 104 mEq/L (ref 96–112)
Creatinine, Ser: 1.4 mg/dL — ABNORMAL HIGH (ref 0.50–1.35)
Glucose, Bld: 106 mg/dL — ABNORMAL HIGH (ref 70–99)

## 2012-09-24 LAB — CBC
HCT: 43.1 % (ref 39.0–52.0)
MCH: 29.7 pg (ref 26.0–34.0)
MCHC: 35 g/dL (ref 30.0–36.0)
MCV: 84.8 fL (ref 78.0–100.0)
Platelets: 175 10*3/uL (ref 150–400)
RDW: 13 % (ref 11.5–15.5)

## 2012-09-24 MED ORDER — ZOLPIDEM TARTRATE 5 MG PO TABS: 10.0000 mg | ORAL_TABLET | Freq: Every evening | ORAL | Status: DC | PRN

## 2012-09-24 MED ORDER — FENOFIBRATE 160 MG PO TABS
160.0000 mg | ORAL_TABLET | Freq: Every day | ORAL | Status: DC
  Administered 2012-09-24 – 2012-09-25 (×2): 160 mg via ORAL
  Filled 2012-09-24 (×2): qty 1

## 2012-09-24 MED ORDER — ASPIRIN 81 MG PO CHEW
81.0000 mg | CHEWABLE_TABLET | Freq: Every day | ORAL | Status: DC
  Administered 2012-09-24 – 2012-09-25 (×2): 81 mg via ORAL
  Filled 2012-09-24: qty 1

## 2012-09-24 NOTE — Progress Notes (Signed)
[id number] Cardiac Rehab Completed discharge education with pt and wife. They voice understanding.

## 2012-09-24 NOTE — Progress Notes (Signed)
SUBJECTIVE: Feels well this am. No chest pain or SOB. 1 seven beat run of VT overnight.   BP 117/73  Pulse 75  Temp 98.1 F (36.7 C) (Oral)  Resp 18  Ht 5\' 8"  (1.727 m)  Wt 173 lb 11.6 oz (78.8 kg)  BMI 26.41 kg/m2  SpO2 97%  Intake/Output Summary (Last 24 hours) at 09/24/12 0743 Last data filed at 09/24/12 0600  Gross per 24 hour  Intake    750 ml  Output    750 ml  Net      0 ml    PHYSICAL EXAM General: Well developed, well nourished, in no acute distress. Alert and oriented x 3.  Psych:  Good affect, responds appropriately Neck: No JVD. No masses noted.  Lungs: Clear bilaterally with no wheezes or rhonci noted.  Heart: RRR with loud systolic murmur noted. Abdomen: Bowel sounds are present. Soft, non-tender.  Extremities: No lower extremity edema. Right wrist with small hematoma.   LABS: Basic Metabolic Panel:  Basename 09/24/12 0533 09/23/12 0254  NA 139 138  K 4.2 3.5  CL 104 105  CO2 25 24  GLUCOSE 106* 107*  BUN 17 11  CREATININE 1.40* 1.24  CALCIUM 9.7 9.0  MG -- --  PHOS -- --   CBC:  Basename 09/24/12 0533 09/23/12 0254 09/22/12 1357  WBC 8.6 10.1 --  NEUTROABS -- -- 4.8  HGB 15.1 14.6 --  HCT 43.1 40.7 --  MCV 84.8 83.7 --  PLT 175 167 --   Cardiac Enzymes:  Basename 09/23/12 0253 09/22/12 2204 09/22/12 1600  CKTOTAL -- -- --  CKMB -- -- --  CKMBINDEX -- -- --  TROPONINI >20.00* >20.00* >20.00*   Fasting Lipid Panel:  Basename 09/23/12 0254  CHOL 203*  HDL 27*  LDLCALC 130*  TRIG 231*  CHOLHDL 7.5  LDLDIRECT --    Current Meds:    . amLODipine  10 mg Oral Daily  . aspirin  81 mg Oral Daily  . heparin  5,000 Units Subcutaneous Q8H  . lisinopril  10 mg Oral Daily  . metoprolol succinate  50 mg Oral Daily  . niacin  500 mg Oral BID WC  . pantoprazole  40 mg Oral Daily  . potassium chloride  40 mEq Oral Once  . Ticagrelor  90 mg Oral BID  . DISCONTD: niacin  500 mg Oral BID WC   Cardiac Cath 09/22/12:  Left mainstem:  Normal.  Left anterior descending (LAD): The left anterior descending artery has a focal 95-99% stenosis in the proximal vessel. This is proximal to the prior stent in the mid vessel which is widely patent. The diagonal is widely patent.  Left circumflex (LCx): The left circumflex gives rise to a small to moderate sized first marginal branch. It is then occluded in the mid to distal vessel which comprises a second marginal branch.  Right coronary artery (RCA): The right coronary is a large dominant vessel which has only mild irregularities less than 10%.  Left ventriculography: Left ventricular angiography was performed at the end of the procedure. This demonstrates normal left ventricle or size. There is severe hypokinesis of the mid to distal anterior wall and apex. Overall ejection fraction is estimated at 40-45%. There is no significant mitral insufficiency.   Echo 09/24/12:  Left ventricle: There was moderate to severe asymmetric basal septal hypertrophy. The cavity size was normal. The estimated ejection fraction was 55%. Mid to apical anterolateral mild hypokinesis, posterior mild hypokinesis, apical  anterior hypokinesis, hypokinesis of the true apex, There was turbulence across the LV outflow tract suggesting a degree of obstruction but gradient could not be accurately measured due to doppler contamination from MR signal. Doppler parameters are consistent with abnormal left ventricular relaxation (grade 1 diastolic dysfunction). Septal thickness: 22mm (ED, 2D). Posterior wall thickness: 10mm (ED, 2D). - Aortic valve: There was no stenosis. - Mitral valve: There was systolic anterior motion of the mitral valve. Mild regurgitation. - Left atrium: The atrium was mildly dilated. - Right ventricle: The cavity size was normal. Systolic function was normal. - Pulmonary arteries: No complete TR doppler jet so unable to estimate PA systolic pressure. - Inferior vena cava: The vessel was  normal in size; the respirophasic diameter changes were in the normal range (= 50%); findings are consistent with normal central venous pressure.    ASSESSMENT AND PLAN:   1. STEMI: Pt admitted with STEMI 09/22/12. Now s/p DES x 1 mid Circumflex into OM2, and s/p DES x 1 proximal LAD. Will continue ASA and Brilinta for at least one year. He is statin intolerant. Continue beta blocker, Ace-inh and Niacin.  Cardiac rehab. 24 more hours of monitoring.   2. Hypokalemia: Resolved.   3. Hypertrophic CM: Moderate to severe asymmetric basal septal hypertrophy. No changes for now. Follow as outpatient.   4. NSVT: Follow on telemetry. Continue beta blocker.   Will monitor for 24 more hours with recent STEMI, VT last night.   Asbury Hair  10/22/20137:43 AM

## 2012-09-24 NOTE — Progress Notes (Signed)
Patient is statin intolerant. He has been on niacin 1000 mg daily but is not at goal. There is elevation of triglycerides and low HDL as well. Will add fenofibrate 160 mg daily.   Trinisha Paget [place] MD, [facility]

## 2012-09-25 ENCOUNTER — Other Ambulatory Visit: Payer: 59

## 2012-09-25 ENCOUNTER — Encounter (HOSPITAL_COMMUNITY): Payer: Self-pay | Admitting: Physician Assistant

## 2012-09-25 DIAGNOSIS — I421 Obstructive hypertrophic cardiomyopathy: Secondary | ICD-10-CM

## 2012-09-25 DIAGNOSIS — N183 Chronic kidney disease, stage 3 unspecified: Secondary | ICD-10-CM

## 2012-09-25 DIAGNOSIS — I472 Ventricular tachycardia: Secondary | ICD-10-CM

## 2012-09-25 LAB — BASIC METABOLIC PANEL
BUN: 20 mg/dL (ref 6–23)
Creatinine, Ser: 1.43 mg/dL — ABNORMAL HIGH (ref 0.50–1.35)
GFR calc Af Amer: 59 mL/min — ABNORMAL LOW (ref >90)
GFR calc non Af Amer: 51 mL/min — ABNORMAL LOW (ref >90)
Glucose, Bld: 98 mg/dL (ref 70–99)
Potassium: 4 mEq/L (ref 3.5–5.1)

## 2012-09-25 MED ORDER — NITROGLYCERIN 0.4 MG SL SUBL: 0.4000 mg | SUBLINGUAL_TABLET | SUBLINGUAL | Status: DC | PRN

## 2012-09-25 MED ORDER — TICAGRELOR 90 MG PO TABS: 90.0000 mg | ORAL_TABLET | Freq: Two times a day (BID) | ORAL | Status: DC

## 2012-09-25 MED ORDER — FENOFIBRATE 160 MG PO TABS: 160.0000 mg | ORAL_TABLET | Freq: Every day | ORAL | Status: DC

## 2012-09-25 NOTE — Progress Notes (Signed)
SUBJECTIVE: No chest pain or SOB. No events last 24 hours. No VT.   BP 95/66  Pulse 70  Temp 97.4 F (36.3 C) (Oral)  Resp 18  Ht 5\' 8"  (1.727 m)  Wt 170 lb 8 oz (77.338 kg)  BMI 25.92 kg/m2  SpO2 94%  Intake/Output Summary (Last 24 hours) at 09/25/12 0657 Last data filed at 09/24/12 1800  Gross per 24 hour  Intake    600 ml  Output    225 ml  Net    375 ml    PHYSICAL EXAM General: Well developed, well nourished, in no acute distress. Alert and oriented x 3.  Psych:  Good affect, responds appropriately Neck: No JVD. No masses noted.  Lungs: Clear bilaterally with no wheezes or rhonci noted.  Heart: RRR with loud systolic murmur. Unchanged.  Abdomen: Bowel sounds are present. Soft, non-tender.  Extremities: No lower extremity edema. Right wrist with bruising.   LABS: Basic Metabolic Panel:  Basename 09/24/12 0533 09/23/12 0254  NA 139 138  K 4.2 3.5  CL 104 105  CO2 25 24  GLUCOSE 106* 107*  BUN 17 11  CREATININE 1.40* 1.24  CALCIUM 9.7 9.0  MG -- --  PHOS -- --   CBC:  Basename 09/24/12 0533 09/23/12 0254 09/22/12 1357  WBC 8.6 10.1 --  NEUTROABS -- -- 4.8  HGB 15.1 14.6 --  HCT 43.1 40.7 --  MCV 84.8 83.7 --  PLT 175 167 --   Cardiac Enzymes:  Basename 09/23/12 0253 09/22/12 2204 09/22/12 1600  CKTOTAL -- -- --  CKMB -- -- --  CKMBINDEX -- -- --  TROPONINI >20.00* >20.00* >20.00*   Fasting Lipid Panel:  Basename 09/23/12 0254  CHOL 203*  HDL 27*  LDLCALC 130*  TRIG 231*  CHOLHDL 7.5  LDLDIRECT --    Current Meds:    . amLODipine  10 mg Oral Daily  . aspirin  81 mg Oral Daily  . fenofibrate  160 mg Oral Daily  . heparin  5,000 Units Subcutaneous Q8H  . lisinopril  10 mg Oral Daily  . metoprolol succinate  50 mg Oral Daily  . niacin  500 mg Oral BID WC  . pantoprazole  40 mg Oral Daily  . Ticagrelor  90 mg Oral BID  . DISCONTD: aspirin  81 mg Oral Daily   Cardiac Cath 09/22/12:  Left mainstem: Normal.  Left anterior  descending (LAD): The left anterior descending artery has a focal 95-99% stenosis in the proximal vessel. This is proximal to the prior stent in the mid vessel which is widely patent. The diagonal is widely patent.  Left circumflex (LCx): The left circumflex gives rise to a small to moderate sized first marginal branch. It is then occluded in the mid to distal vessel which comprises a second marginal branch.  Right coronary artery (RCA): The right coronary is a large dominant vessel which has only mild irregularities less than 10%.  Left ventriculography: Left ventricular angiography was performed at the end of the procedure. This demonstrates normal left ventricle or size. There is severe hypokinesis of the mid to distal anterior wall and apex. Overall ejection fraction is estimated at 40-45%. There is no significant mitral insufficiency.  Echo 09/24/12:  Left ventricle: There was moderate to severe asymmetric basal septal hypertrophy. The cavity size was normal. The estimated ejection fraction was 55%. Mid to apical anterolateral mild hypokinesis, posterior mild hypokinesis, apical anterior hypokinesis, hypokinesis of the true apex, There was  turbulence across the LV outflow tract suggesting a degree of obstruction but gradient could not be accurately measured due to doppler contamination from MR signal. Doppler parameters are consistent with abnormal left ventricular relaxation (grade 1 diastolic dysfunction). Septal thickness: 22mm (ED, 2D). Posterior wall thickness: 10mm (ED, 2D). - Aortic valve: There was no stenosis. - Mitral valve: There was systolic anterior motion of the mitral valve. Mild regurgitation. - Left atrium: The atrium was mildly dilated. - Right ventricle: The cavity size was normal. Systolic function was normal. - Pulmonary arteries: No complete TR doppler jet so unable to estimate PA systolic pressure. - Inferior vena cava: The vessel was normal in size;  the respirophasic diameter changes were in the normal range (= 50%); findings are consistent with normal central venous pressure.  ASSESSMENT AND PLAN:   1. STEMI: Pt admitted with STEMI 09/22/12. Now s/p DES x 1 mid Circumflex into OM2, and s/p DES x 1 proximal LAD. Will continue ASA and Brilinta for at least one year. He is statin intolerant. Continue beta blocker, Ace-inh and Niacin. Fenofibrate added yesterday.    2. Hypokalemia: Resolved.   3. Hypertrophic CM: Moderate to severe asymmetric basal septal hypertrophy. No changes for now. Follow as outpatient.   4. NSVT: No recurrence last 24 hours.  Continue beta blocker.  5. Dispo: D/C home today if BMET ok. F/U with Dr. Swaziland in 2-3 weeks. He has Brilinta starter packet.      MCALHANY,CHRISTOPHER  10/23/20136:57 AM

## 2012-09-25 NOTE — Discharge Summary (Signed)
Discharge Summary   Patient ID: Brandon Gonzales,  MRN: [id number], DOB/AGE: October 12, 1951 61 y.o.  Admit date: 09/22/2012 Discharge date: 09/25/2012  Primary Physician: Brandon Blamer, MD Primary Cardiologist: Swaziland, Peter, MD  Discharge Diagnoses Principal Problem:  *ST elevation myocardial infarction (STEMI) of lateral wall Active Problems:  Dyslipidemia  H/O gastroesophageal reflux (GERD)  CAD (coronary artery disease)  HTN (hypertension)  NSVT (nonsustained ventricular tachycardia)  Other hypertrophic cardiomyopathy  Acute renal insufficiency  Allergies Allergies  Allergen Reactions  . Antihistamines, Chlorpheniramine-Type Other (See Comments)    Altered mental status  . Statins   . Zetia (Ezetimibe)    Diagnostic Studies/Procedures  PORTABLE CHEST X-RAY - 09/22/12   Comparison: 03/08/2012  Findings: Heart size and vascularity are normal and the lungs are clear. No osseous abnormality.  IMPRESSION:  Normal chest.  CARDIAC CATHETERIZATION + PCI - 09/22/12  Hemodynamics:  AO 120/69 with a mean of 87 mmHg  LV 130/30 mmHg  Coronary angiography:  Coronary dominance: right  Left mainstem: Normal.  Left anterior descending (LAD): The left anterior descending artery has a focal 95-99% stenosis in the proximal vessel. This is proximal to the prior stent in the mid vessel which is widely patent. The diagonal is widely patent.  Left circumflex (LCx): The left circumflex gives rise to a small to moderate sized first marginal branch. It is then occluded in the mid to distal vessel which comprises a second marginal branch.  Right coronary artery (RCA): The right coronary is a large dominant vessel which has only mild irregularities less than 10%.  Left ventriculography: Left ventricular angiography was performed at the end of the procedure. This demonstrates normal left ventricle or size. There is severe hypokinesis of the mid to distal anterior wall and apex. Overall  ejection fraction is estimated at 40-45%. There is no significant mitral insufficiency.  PCI Data:  Vessel - left circumflex/Segment - mid to distal  Percent Stenosis (pre) 100%  TIMI-flow 0  Stent 2.5 x 28 mm Promus  Percent Stenosis (post) 0%  TIMI-flow (post) 3  Vessel #2-LAD/proximal  Percent stenosis (pre-) 95%  TIMI flow 3  Stent 3.0 x 16 mm Promus  Percent stenoses (post) 0%  TIMI flow (post) 3  Final Conclusions:  1. 2 vessel obstructive coronary disease. The culprit lesion was the mid to distal circumflex. Patient also had a critical proximal LAD stenosis. Prior stent in the mid LAD was patent.  2. Moderate left ventricular dysfunction.  3. Successful intracoronary stenting of the mid to distal left circumflex and the proximal LAD with drug-eluting stents.   2D ECHO 09/23/12  Impressions:  - Normal LV size with moderate to severe asymmetric basal septal hypertrophy. There was mild hypokinesis of the mid to apical anterolateral wall, the posterior wall, the apical anterior wall, and the true apex. Other segments were relatively hyperdynamic. Overall EF is likely preserved, estimate 55%. There was systolic anterior motion of the mitral valve with mild MR. There was likely an LV outflow tract gradient but the outflow tract doppler signal was contaminated by the MR doppler signal so unable to estimate the LVOT gradient. Overall, this looks consistent with a hypertrophic cardiomyopathy variant. There are also WMAs consistent with CAD.  History of Present Illness  Brandon Gonzales is a 61yo male admitted to [facility] hospital on 09/25/12 with the above problem list. He had c/o intermittent SOB x 2 weeks. He called the office two days prior to admission c/o chest tightness with exertion and "not  feeling right." He was advised to present to the ED, but declined at that time. This AM, he developed severe SSCP, SOB and weakness. He took his morning meds including low-dose ASA x 1. He came to the  ED where 3 additional low-dose ASA were given. EKG revealed NSR with lateral ST elevations with mild inf changes. CXR as above revealed no acute process. He was heparinized and taken emergently to the cardiac cath lab.   Hospital Course   The full details are outlined above. A 95-99% proximal LAD and occluded LCx stenoses were appreciated. Successful DES placement to these lesions was achieved with 0% post-PCI stenosis. LV-gram revealed LVEF 40-45% and  severe HK of the mid to distal anterior wall and apex. The patient tolerated the procedure well without complications and was transferred to CCU. DAPT- ASA/Brilinta x 1 year was recommended and started. Outpatient BB, ACEi and niacin were continued. Serial troponins were cycled and as suspected returned markedly elevated. The patient's symptoms improved. A 2D echo as above revealed LVEF 55%, mod-severe asymmetric basal septal hypertrophy, systolic anterior MV motion with mild MR with a likely LVOT gradient all c/w hypertrophic CM. Multiple WMAs were observed c/w underlying CAD. A lipid profile was obtained revealed LDL 130, TG 231, TC 203, HDL 27. The patient has a prior statin intolerance, and takes niacin. Fibrate was added. TSH returned WNL. He did have occasional, asymptomatic runs of NSVT likely attributed to cardiac ischemia and underlying CM. He ambulated well with cardiac rehab and was transferred to telemetry. He has developed a mild acute renal insufficiency likely secondary to contrast exposure during cath (had been on ACEi before admission). He remained euvolemic.  He was assessed by Brandon Gonzales this morning, and will be discharged. He will follow up in the office in </= 7 days as noted below. He will be informed to stay well-hydrated and BMET will be done at this time to reassess renal function. He will continue the meds below including ASA/Brilinta/ACEi/BB/niacin + fibrate. NTG SL has been added. Hypertrophic CM will be followed as an outpatient.  This information, including post-cath instructions, have been clearly outlined in the discharge AVS.   Discharge Vitals:  Blood pressure 95/66, pulse 70, temperature 97.4 F (36.3 C), temperature source Oral, resp. rate 18, height 5\' 8"  (1.727 m), weight 77.338 kg (170 lb 8 oz), SpO2 94.00%.   Labs: Recent Labs  Basename 09/24/12 0533 09/23/12 0254   WBC 8.6 10.1   HGB 15.1 14.6   HCT 43.1 40.7   MCV 84.8 83.7   PLT 175 167    Lab 09/25/12 0536 09/24/12 0533 09/23/12 0254  NA 141 139 138  K 4.0 4.2 3.5  CL 106 104 105  CO2 22 25 24   BUN 20 17 11   CREATININE 1.43* 1.40* 1.24  CALCIUM 9.1 9.7 9.0  PROT -- -- 5.9*  BILITOT -- -- 1.5*  ALKPHOS -- -- 54  ALT -- -- 44  AST -- -- 198*  AMYLASE -- -- --  LIPASE -- -- --  GLUCOSE 98 106* 107*   Recent Labs  Basename 09/23/12 0253 09/22/12 2204 09/22/12 1600   CKTOTAL -- -- --   CKMB -- -- --   CKMBINDEX -- -- --   TROPONINI >20.00* >20.00* >20.00*   Recent Labs  Basename 09/23/12 0254   CHOL 203*   HDL 27*   LDLCALC 130*   TRIG 231*   CHOLHDL 7.5   LDLDIRECT --    Basename 09/22/12 1601  TSH 2.353  T4TOTAL --  T3FREE --  THYROIDAB --    Disposition:  Discharge Orders    Future Appointments: Provider: Department: Dept Phone: Center:   10/01/2012 8:00 AM Rosalio Macadamia, NP [facility] [phone] [facility]   10/01/2012 8:30 AM [facility] [phone] [facility]     Future Orders Please Complete By Expires   Amb Referral to Cardiac Rehabilitation        Follow-up Information    Follow up with Norma Fredrickson, NP. On 10/01/2012. (For follow-up at 8:00 AM after this hospitalization. Lab work will be done at this time. )    Contact information:   [address] [place] [address] [phone]          Discharge Medications:    Medication List     As of 09/25/2012 10:12 AM    START taking these medications         fenofibrate 160 MG  tablet   Take 1 tablet (160 mg total) by mouth daily.      nitroGLYCERIN 0.4 MG SL tablet   Commonly known as: NITROSTAT   Place 1 tablet (0.4 mg total) under the tongue every 5 (five) minutes as needed for chest pain.      Ticagrelor 90 MG Tabs tablet   Commonly known as: BRILINTA   Take 1 tablet (90 mg total) by mouth 2 (two) times daily.      CONTINUE taking these medications         amLODipine 10 MG tablet   Commonly known as: NORVASC      aspirin 81 MG tablet      lisinopril 10 MG tablet   Commonly known as: PRINIVIL,ZESTRIL   Take 1 tablet (10 mg total) by mouth daily.      metoprolol succinate 50 MG 24 hr tablet   Commonly known as: TOPROL-XL   Take 1 tablet (50 mg total) by mouth daily. Take with or immediately following a meal.      niacin 500 MG tablet      PRILOSEC OTC PO          Where to get your medications    These are the prescriptions that you need to pick up. We sent them to a specific pharmacy, so you will need to go there to get them.   [id number] - [place] - [address]    [address] [place] [address]    Phone: [phone]        fenofibrate 160 MG tablet   nitroGLYCERIN 0.4 MG SL tablet   Ticagrelor 90 MG Tabs tablet           Outstanding Labs/Studies: BMET on 10/29  Duration of Discharge Encounter: Greater than 30 minutes including physician time.  Signed, R. Hurman Horn, PA-C 09/25/2012, 10:12 AM

## 2012-09-25 NOTE — Discharge Summary (Signed)
See full note this am. cdm 

## 2012-10-01 ENCOUNTER — Other Ambulatory Visit (INDEPENDENT_AMBULATORY_CARE_PROVIDER_SITE_OTHER): Payer: 59

## 2012-10-01 ENCOUNTER — Ambulatory Visit (INDEPENDENT_AMBULATORY_CARE_PROVIDER_SITE_OTHER): Payer: 59 | Admitting: Nurse Practitioner

## 2012-10-01 ENCOUNTER — Encounter: Payer: Self-pay | Admitting: Nurse Practitioner

## 2012-10-01 VITALS — BP 110/72 | HR 64 | Ht 68.0 in | Wt 173.4 lb

## 2012-10-01 DIAGNOSIS — I219 Acute myocardial infarction, unspecified: Secondary | ICD-10-CM

## 2012-10-01 DIAGNOSIS — I2510 Atherosclerotic heart disease of native coronary artery without angina pectoris: Secondary | ICD-10-CM

## 2012-10-01 DIAGNOSIS — E785 Hyperlipidemia, unspecified: Secondary | ICD-10-CM

## 2012-10-01 DIAGNOSIS — I213 ST elevation (STEMI) myocardial infarction of unspecified site: Secondary | ICD-10-CM

## 2012-10-01 LAB — LIPID PANEL
Cholesterol: 210 mg/dL — ABNORMAL HIGH (ref 0–200)
HDL: 26.1 mg/dL — ABNORMAL LOW (ref >39.00)
Total CHOL/HDL Ratio: 8
Triglycerides: 171 mg/dL — ABNORMAL HIGH (ref 0.0–149.0)

## 2012-10-01 LAB — HEPATIC FUNCTION PANEL
ALT: 27 U/L (ref 0–53)
Bilirubin, Direct: 0 mg/dL (ref 0.0–0.3)
Total Bilirubin: 1.5 mg/dL — ABNORMAL HIGH (ref 0.3–1.2)
Total Protein: 7 g/dL (ref 6.0–8.3)

## 2012-10-01 LAB — BASIC METABOLIC PANEL
BUN: 25 mg/dL — ABNORMAL HIGH (ref 6–23)
Calcium: 9.5 mg/dL (ref 8.4–10.5)
Chloride: 104 mEq/L (ref 96–112)
Creatinine, Ser: 1.5 mg/dL (ref 0.4–1.5)

## 2012-10-01 NOTE — Progress Notes (Signed)
Kouper Spinella Checo Date of Birth: 03/10/1951 Medical Record #[id number]  History of Present Illness: Mr. Brandon Gonzales is seen back today for a post hospital visit. He is seen for Dr. Swaziland. He has had a recent STEMI of the lateral wall with stenting of the mid to distal LCX and the proximal LAD with DES. His other issues include known CAD with prior stenting of the LAD, HTN, HLD, hypertrophic CM, renal insufficiency, and past NSVT.   He comes in today. He is here with his wife. He was recently admitted with chest pain and shortness of breath. Had had symptoms for 2 weeks prior. Declined to go to the ER when advised. Then presented with a STEMI. He has had 2 DES stents placed to the LCX and LAD. EF was 40 to 45% at the time of his cath and 55% per echo the following day. He is feeling better. He remains fatigued. No recurrent chest pain. Not short of breath but not doing as much in order to get short of breath. No problems with his right arm. Some bruising noted. He says he has had a "complete life change" and is now more motivated to modify his CV risk factors. He has missed a dose of his Brilinta. Not dizzy or lightheaded.   Current Outpatient Prescriptions on File Prior to Visit  Medication Sig Dispense Refill  . amLODipine (NORVASC) 10 MG tablet Take 10 mg by mouth daily.      [name] Kitchen aspirin 81 MG tablet Take 81 mg by mouth daily.      . fenofibrate 160 MG tablet Take 1 tablet (160 mg total) by mouth daily.  30 tablet  3  . lisinopril (PRINIVIL,ZESTRIL) 10 MG tablet Take 1 tablet (10 mg total) by mouth daily.  90 tablet  3  . metoprolol succinate (TOPROL-XL) 50 MG 24 hr tablet Take 1 tablet (50 mg total) by mouth daily. Take with or immediately following a meal.  90 tablet  3  . niacin 500 MG tablet Take 500 mg by mouth 2 (two) times daily with a meal.      . nitroGLYCERIN (NITROSTAT) 0.4 MG SL tablet Place 1 tablet (0.4 mg total) under the tongue every 5 (five) minutes as needed for chest pain.  25 tablet   3  . Omeprazole Magnesium (PRILOSEC OTC PO) Take by mouth.      . Ticagrelor (BRILINTA) 90 MG TABS tablet Take 1 tablet (90 mg total) by mouth 2 (two) times daily.  60 tablet  3    Allergies  Allergen Reactions  . Antihistamines, Chlorpheniramine-Type Other (See Comments)    Altered mental status  . Statins   . Zetia (Ezetimibe)     Past Medical History  Diagnosis Date  . Hypertension   . Dyslipidemia     Statin-intolerant.  . H/O gastroesophageal reflux (GERD)   . Arthritis   . Coronary artery disease 2007    a. Stent to LAD,  balloon angioplasty of D1 in 2007. b. DES to LAD & LCx 10/13 in the setting of STEMI  . ST elevation myocardial infarction (STEMI) of lateral wall 09/22/2012  . Anginal pain   . Heart murmur   . GERD (gastroesophageal reflux disease)   . Cancer     SKIN CA  SQUAMOUS CELL  . Hypertrophic cardiomyopathy     Past Surgical History  Procedure Date  . Knee surgery 1993    left  . Elbow surgery 1996    right  . Cardiac  catheterization 2007    stent to LAD and PTCA of diagonal branch  . Cardiac catheterization 2010  . Coronary angioplasty with stent placement 2013    95-99% prox LAD, patent LAD stent distal to this, occluded mid-dist LCx, mild <10% RCA irreg; s/p DES-prox LAD & DES to mid-dist LCx    History  Smoking status  . Never Smoker   Smokeless tobacco  . Never Used    History  Alcohol Use No    Family History  Problem Relation Age of Onset  . Heart attack Mother   . Heart disease Mother     Review of Systems: The review of systems is per the HPI.  All other systems were reviewed and are negative.  Physical Exam: BP 110/72  Pulse 64  Ht 5\' 8"  (1.727 m)  Wt 173 lb 6.4 oz (78.654 kg)  BMI 26.37 kg/m2 Patient is very pleasant and in no acute distress. Skin is warm and dry. Color is normal.  HEENT is unremarkable. Normocephalic/atraumatic. PERRL. Sclera are nonicteric. Neck is supple. No masses. No JVD. Lungs are clear. Cardiac  exam shows a regular rate and rhythm. He has a harsh outflow murmur.  Abdomen is soft. Extremities are without edema. Gait and ROM are intact. No gross neurologic deficits noted.   LABORATORY DATA: BMET & HPF for today is pending  Lab Results  Component Value Date   WBC 8.6 09/24/2012   HGB 15.1 09/24/2012   HCT 43.1 09/24/2012   PLT 175 09/24/2012   GLUCOSE 98 09/25/2012   CHOL 203* 09/23/2012   TRIG 231* 09/23/2012   HDL 27* 09/23/2012   LDLCALC 130* 09/23/2012   ALT 44 09/23/2012   AST 198* 09/23/2012   NA 141 09/25/2012   K 4.0 09/25/2012   CL 106 09/25/2012   CREATININE 1.43* 09/25/2012   BUN 20 09/25/2012   CO2 22 09/25/2012   TSH 2.353 09/22/2012   INR 1.08 09/22/2012   Cardiac Cath Final Conclusions:   1. 2 vessel obstructive coronary disease. The culprit lesion was the mid to distal circumflex. Patient also had a critical proximal LAD stenosis. Prior stent in the mid LAD was patent.  2. Moderate left ventricular dysfunction.  3. Successful intracoronary stenting of the mid to distal left circumflex and the proximal LAD with drug-eluting stents.   Recommendations:   Recommend dual antiplatelet therapy for at least one year. Patient is statin intolerance. We'll continue niacin. Assess lipid panel. We will obtain an echocardiogram. I would anticipate a 3 day hospital stay depending on his clinical course.  Theron Arista [facility]  09/22/2012, 3:23 PM  Echo Study Conclusions  - Left ventricle: There was moderate to severe asymmetric basal septal hypertrophy. The cavity size was normal. The estimated ejection fraction was 55%. Mid to apical anterolateral mild hypokinesis, posterior mild hypokinesis, apical anterior hypokinesis, hypokinesis of the true apex, There was turbulence across the LV outflow tract suggesting a degree of obstruction but gradient could not be accurately measured due to doppler contamination from MR signal. Doppler parameters are consistent with  abnormal left ventricular relaxation (grade 1 diastolic dysfunction). Septal thickness: 22mm (ED, 2D). Posterior wall thickness: 10mm (ED, 2D). - Aortic valve: There was no stenosis. - Mitral valve: There was systolic anterior motion of the mitral valve. Mild regurgitation. - Left atrium: The atrium was mildly dilated. - Right ventricle: The cavity size was normal. Systolic function was normal. - Pulmonary arteries: No complete TR doppler jet so unable to estimate PA systolic pressure. -  Inferior vena cava: The vessel was normal in size; the respirophasic diameter changes were in the normal range (= 50%); findings are consistent with normal central venous  Lab Results  Component Value Date   TROPONINI >20.00* 09/23/2012   Assessment / Plan:  1. Recent STEMI with 2 DES placed. Doing well post MI. Will allow him to go to cardiac rehab. He is reminded of the importance of his taking his Brilinta.  2. HLD - on fenofibrate now as well. Needs repeat lipids on return. Will also recheck his LFT's today.  3. Hypertrophic CM - EF normal by echo and was 40 to 45% per cath. He is on beta blocker. Not symptomatic at this time but may need titration of his BB.   I think overall he is doing ok. He is allowed to drive. Will start cardiac rehab. Dr. Swaziland will see him back in 3 weeks. Patient is agreeable to this plan and will call if any problems develop in the interim.

## 2012-10-01 NOTE — Patient Instructions (Addendum)
We will check labs today  Stay on your current medicines  Ok to start cardiac rehab  Ok to drive  Dr. Swaziland will see you in 3 weeks with fasting labs  Call the [facility] office at [phone] if you have any questions, problems or concerns.

## 2012-10-04 ENCOUNTER — Encounter: Payer: Self-pay | Admitting: Cardiology

## 2012-10-07 ENCOUNTER — Encounter: Payer: Self-pay | Admitting: Cardiology

## 2012-10-09 ENCOUNTER — Telehealth: Payer: Self-pay

## 2012-10-09 NOTE — Telephone Encounter (Signed)
Spoke to patient 10/08/12 and was told lab results.Patient stated he had pain under left arm and left side off and on all day this past Sunday.States on Monday 10/07/12 had indigestion.States feels good today 10/08/12,no pain or indigestion.Advised to use NTG next time had pain and call back if pain continues.

## 2012-10-17 ENCOUNTER — Encounter (HOSPITAL_COMMUNITY)
Admission: RE | Admit: 2012-10-17 | Discharge: 2012-10-17 | Disposition: A | Discharge status: Home / Self Care | Payer: 59 | Source: Ambulatory Visit | Attending: Cardiology | Admitting: Cardiology

## 2012-10-17 DIAGNOSIS — I2129 ST elevation (STEMI) myocardial infarction involving other sites: Secondary | ICD-10-CM | POA: Insufficient documentation

## 2012-10-17 DIAGNOSIS — I10 Essential (primary) hypertension: Secondary | ICD-10-CM | POA: Insufficient documentation

## 2012-10-17 DIAGNOSIS — I2510 Atherosclerotic heart disease of native coronary artery without angina pectoris: Secondary | ICD-10-CM | POA: Insufficient documentation

## 2012-10-17 DIAGNOSIS — I498 Other specified cardiac arrhythmias: Secondary | ICD-10-CM | POA: Insufficient documentation

## 2012-10-17 DIAGNOSIS — E785 Hyperlipidemia, unspecified: Secondary | ICD-10-CM | POA: Insufficient documentation

## 2012-10-17 DIAGNOSIS — I422 Other hypertrophic cardiomyopathy: Secondary | ICD-10-CM | POA: Insufficient documentation

## 2012-10-17 DIAGNOSIS — Z5189 Encounter for other specified aftercare: Secondary | ICD-10-CM | POA: Insufficient documentation

## 2012-10-17 DIAGNOSIS — K219 Gastro-esophageal reflux disease without esophagitis: Secondary | ICD-10-CM | POA: Insufficient documentation

## 2012-10-17 DIAGNOSIS — N289 Disorder of kidney and ureter, unspecified: Secondary | ICD-10-CM | POA: Insufficient documentation

## 2012-10-17 DIAGNOSIS — Z9861 Coronary angioplasty status: Secondary | ICD-10-CM | POA: Insufficient documentation

## 2012-10-17 NOTE — Progress Notes (Signed)
Cardiac Rehab Medication Review by a Pharmacist  Does the patient  feel that his/her medications are working for him/her?  yes  Has the patient been experiencing any side effects to the medications prescribed?  yes  Does the patient measure his/her own blood pressure or blood glucose at home?  yes   Does the patient have any problems obtaining medications due to transportation or finances?   yes  Understanding of regimen: excellent Understanding of indications: excellent Potential of compliance: excellent    Pharmacist comments: none    Laurence Slate 10/17/2012 9:16 AM

## 2012-10-23 ENCOUNTER — Encounter (HOSPITAL_COMMUNITY)
Admission: RE | Admit: 2012-10-23 | Discharge: 2012-10-23 | Disposition: A | Discharge status: Home / Self Care | Payer: 59 | Source: Ambulatory Visit | Attending: Cardiology | Admitting: Cardiology

## 2012-10-23 NOTE — Progress Notes (Signed)
Pt started cardiac rehab today.  Pt tolerated light exercise without difficulty. Telemetry Sinus with T wave inversion. This has been previously documented. Vital signs stable. Will continue to monitor the patient throughout  the program.

## 2012-10-24 ENCOUNTER — Other Ambulatory Visit: Payer: Self-pay | Admitting: *Deleted

## 2012-10-24 DIAGNOSIS — I10 Essential (primary) hypertension: Secondary | ICD-10-CM

## 2012-10-24 DIAGNOSIS — I2510 Atherosclerotic heart disease of native coronary artery without angina pectoris: Secondary | ICD-10-CM

## 2012-10-25 ENCOUNTER — Encounter (HOSPITAL_COMMUNITY): Payer: 59

## 2012-10-25 ENCOUNTER — Other Ambulatory Visit: Payer: Self-pay

## 2012-10-25 ENCOUNTER — Other Ambulatory Visit (INDEPENDENT_AMBULATORY_CARE_PROVIDER_SITE_OTHER): Payer: 59

## 2012-10-25 DIAGNOSIS — I2510 Atherosclerotic heart disease of native coronary artery without angina pectoris: Secondary | ICD-10-CM

## 2012-10-25 DIAGNOSIS — I10 Essential (primary) hypertension: Secondary | ICD-10-CM

## 2012-10-25 LAB — BASIC METABOLIC PANEL
BUN: 18 mg/dL (ref 6–23)
CO2: 29 mEq/L (ref 19–32)
Calcium: 9.5 mg/dL (ref 8.4–10.5)
Chloride: 105 mEq/L (ref 96–112)
Creatinine, Ser: 1.6 mg/dL — ABNORMAL HIGH (ref 0.4–1.5)
GFR: 47.59 mL/min — ABNORMAL LOW (ref >60.00)
Glucose, Bld: 103 mg/dL — ABNORMAL HIGH (ref 70–99)
Potassium: 4.8 mEq/L (ref 3.5–5.1)
Sodium: 140 mEq/L (ref 135–145)

## 2012-10-25 LAB — HEPATIC FUNCTION PANEL
ALT: 98 U/L — ABNORMAL HIGH (ref 0–53)
AST: 76 U/L — ABNORMAL HIGH (ref 0–37)
Albumin: 4.5 g/dL (ref 3.5–5.2)
Alkaline Phosphatase: 48 U/L (ref 39–117)
Bilirubin, Direct: 0.2 mg/dL (ref 0.0–0.3)
Total Bilirubin: 1.3 mg/dL — ABNORMAL HIGH (ref 0.3–1.2)
Total Protein: 6.9 g/dL (ref 6.0–8.3)

## 2012-10-25 LAB — LIPID PANEL
Cholesterol: 175 mg/dL (ref 0–200)
HDL: 33.3 mg/dL — ABNORMAL LOW (ref >39.00)
LDL Cholesterol: 118 mg/dL — ABNORMAL HIGH (ref 0–99)
Total CHOL/HDL Ratio: 5
Triglycerides: 120 mg/dL (ref 0.0–149.0)
VLDL: 24 mg/dL (ref 0.0–40.0)

## 2012-10-28 ENCOUNTER — Encounter (HOSPITAL_COMMUNITY)
Admission: RE | Admit: 2012-10-28 | Discharge: 2012-10-28 | Disposition: A | Discharge status: Home / Self Care | Payer: 59 | Source: Ambulatory Visit | Attending: Cardiology | Admitting: Cardiology

## 2012-10-29 ENCOUNTER — Telehealth: Payer: Self-pay

## 2012-10-29 NOTE — Telephone Encounter (Signed)
Spoke to patient he stated he has lost 10 to 11 lbs and his B/P has been low 102/68.Stated he wanted to know if he could decrease his B/P medication.Spoke to Dr.Jordan 10/28/12  he advised may decrease norvasc to 5 mg daily.Advised to call back if needed.

## 2012-10-30 ENCOUNTER — Encounter: Payer: Self-pay | Admitting: Cardiology

## 2012-10-30 ENCOUNTER — Encounter (HOSPITAL_COMMUNITY)
Admission: RE | Admit: 2012-10-30 | Discharge: 2012-10-30 | Disposition: A | Discharge status: Home / Self Care | Payer: 59 | Source: Ambulatory Visit | Attending: Cardiology | Admitting: Cardiology

## 2012-11-01 ENCOUNTER — Ambulatory Visit (INDEPENDENT_AMBULATORY_CARE_PROVIDER_SITE_OTHER): Payer: 59 | Admitting: Cardiology

## 2012-11-01 ENCOUNTER — Encounter: Payer: Self-pay | Admitting: Cardiology

## 2012-11-01 ENCOUNTER — Encounter (HOSPITAL_COMMUNITY): Payer: 59

## 2012-11-01 VITALS — BP 122/60 | HR 74 | Resp 18 | Ht 68.0 in | Wt 169.1 lb

## 2012-11-01 DIAGNOSIS — I421 Obstructive hypertrophic cardiomyopathy: Secondary | ICD-10-CM

## 2012-11-01 DIAGNOSIS — I10 Essential (primary) hypertension: Secondary | ICD-10-CM

## 2012-11-01 DIAGNOSIS — E785 Hyperlipidemia, unspecified: Secondary | ICD-10-CM

## 2012-11-01 DIAGNOSIS — I2510 Atherosclerotic heart disease of native coronary artery without angina pectoris: Secondary | ICD-10-CM

## 2012-11-01 NOTE — Progress Notes (Signed)
Brandon Gonzales Date of Birth: [birth year] Medical Record #[id number]  History of Present Illness: Brandon Gonzales is seen back today for a follow up visit. He is s/p STEMI of the lateral wall with stenting of the mid to distal LCX and the proximal LAD with DES in October. His other issues include known CAD with prior stenting of the LAD, HTN, HLD, hypertrophic CM, renal insufficiency, and past NSVT.  Since his heart attack he has made significant lifestyle modifications. He is watching his diet very carefully. He has lost about 15 pounds. He is going to cardiac rehabilitation regularly. His only complaint is that he feels tired in the evening. His blood pressure was running quite low and so we did reduce his amlodipine to 5 mg daily last week.  Current Outpatient Prescriptions on File Prior to Visit  Medication Sig Dispense Refill  . aspirin 81 MG tablet Take 81 mg by mouth daily.      . fenofibrate 160 MG tablet Take 1 tablet (160 mg total) by mouth daily.  30 tablet  3  . lisinopril (PRINIVIL,ZESTRIL) 10 MG tablet Take 1 tablet (10 mg total) by mouth daily.  90 tablet  3  . metoprolol succinate (TOPROL-XL) 50 MG 24 hr tablet Take 1 tablet (50 mg total) by mouth daily. Take with or immediately following a meal.  90 tablet  3  . niacin 500 MG tablet Take 500 mg by mouth 2 (two) times daily with a meal.      . nitroGLYCERIN (NITROSTAT) 0.4 MG SL tablet Place 1 tablet (0.4 mg total) under the tongue every 5 (five) minutes as needed for chest pain.  25 tablet  3  . Omeprazole Magnesium (PRILOSEC OTC PO) Take by mouth.      . Ticagrelor (BRILINTA) 90 MG TABS tablet Take 1 tablet (90 mg total) by mouth 2 (two) times daily.  60 tablet  3  . amLODipine (NORVASC) 10 MG tablet Take 10 mg by mouth daily.        Allergies  Allergen Reactions  . Antihistamines, Chlorpheniramine-Type Other (See Comments)    Altered mental status  . Statins     Leg cramps    . Zetia (Ezetimibe)     Leg cramps    Past  Medical History  Diagnosis Date  . Hypertension   . Dyslipidemia     Statin-intolerant.  . H/O gastroesophageal reflux (GERD)   . Arthritis   . Coronary artery disease 2007    a. Stent to LAD,  balloon angioplasty of D1 in 2007. b. DES to LAD & LCx 10/13 in the setting of STEMI  . ST elevation myocardial infarction (STEMI) of lateral wall 09/22/2012  . Anginal pain   . Heart murmur   . GERD (gastroesophageal reflux disease)   . Cancer     SKIN CA  SQUAMOUS CELL  . Hypertrophic cardiomyopathy     Past Surgical History  Procedure Date  . Knee surgery 1993    left  . Elbow surgery 1996    right  . Cardiac catheterization 2007    stent to LAD and PTCA of diagonal branch  . Cardiac catheterization 2010  . Coronary angioplasty with stent placement 2013    95-99% prox LAD, patent LAD stent distal to this, occluded mid-dist LCx, mild <10% RCA irreg; s/p DES-prox LAD & DES to mid-dist LCx    History  Smoking status  . Never Smoker   Smokeless tobacco  . Never Used  History  Alcohol Use No    Family History  Problem Relation Age of Onset  . Heart attack Mother   . Heart disease Mother     Review of Systems: The review of systems is per the HPI.  All other systems were reviewed and are negative.  Physical Exam: BP 122/60  Pulse 74  Resp 18  Ht 5\' 8"  (1.727 m)  Wt 169 lb 1.9 oz (76.712 kg)  BMI 25.71 kg/m2  SpO2 98% Patient is very pleasant and in no acute distress. Skin is warm and dry. Color is normal.  HEENT is unremarkable. Normocephalic/atraumatic. PERRL. Sclera are nonicteric. Neck is supple. No masses. No JVD. Lungs are clear. Cardiac exam shows a regular rate and rhythm. He has a harsh grade 3/6 outflow murmur at the right upper sternal border radiating to the apex.  Abdomen is soft. Extremities are without edema. Gait and ROM are intact. No gross neurologic deficits noted.   LABORATORY DATA:   Assessment / Plan:  1. Recent STEMI with 2 DES placed to  the distal circumflex and proximal LAD. Doing well post MI. Continue dual antiplatelet therapy for one year. Continue efforts at rehabilitation. Have cleared him for unrestricted activity. I'll followup again in 3 months with fasting lab work.  2. HLD - on fenofibrate and niacin. Recent LFTs showed elevated transaminases. His niacin dose was reduced. He is statin intolerant. We will repeat fasting lab work in 3 months.  3. Hypertrophic CM - EF 55% by echo and was 40 to 45% per cath. He is on beta blocker. Not symptomatic at this time.   4. Hypertension, well controlled. We reduced his amlodipine dose. His blood pressure is low he may be able to titrate this down further.

## 2012-11-01 NOTE — Patient Instructions (Signed)
Continue your current therapy  We will monitor your blood pressure at Rehab.  I will see you again in 3 months with lab work.

## 2012-11-06 ENCOUNTER — Encounter (HOSPITAL_COMMUNITY)
Admission: RE | Admit: 2012-11-06 | Discharge: 2012-11-06 | Disposition: A | Discharge status: Home / Self Care | Payer: 59 | Source: Ambulatory Visit | Attending: Cardiology | Admitting: Cardiology

## 2012-11-06 DIAGNOSIS — Z9861 Coronary angioplasty status: Secondary | ICD-10-CM | POA: Insufficient documentation

## 2012-11-06 DIAGNOSIS — Z5189 Encounter for other specified aftercare: Secondary | ICD-10-CM | POA: Insufficient documentation

## 2012-11-06 DIAGNOSIS — I498 Other specified cardiac arrhythmias: Secondary | ICD-10-CM | POA: Insufficient documentation

## 2012-11-06 DIAGNOSIS — N289 Disorder of kidney and ureter, unspecified: Secondary | ICD-10-CM | POA: Insufficient documentation

## 2012-11-06 DIAGNOSIS — I422 Other hypertrophic cardiomyopathy: Secondary | ICD-10-CM | POA: Insufficient documentation

## 2012-11-06 DIAGNOSIS — I2510 Atherosclerotic heart disease of native coronary artery without angina pectoris: Secondary | ICD-10-CM | POA: Insufficient documentation

## 2012-11-06 DIAGNOSIS — I2129 ST elevation (STEMI) myocardial infarction involving other sites: Secondary | ICD-10-CM | POA: Insufficient documentation

## 2012-11-06 DIAGNOSIS — E785 Hyperlipidemia, unspecified: Secondary | ICD-10-CM | POA: Insufficient documentation

## 2012-11-06 DIAGNOSIS — K219 Gastro-esophageal reflux disease without esophagitis: Secondary | ICD-10-CM | POA: Insufficient documentation

## 2012-11-06 DIAGNOSIS — I10 Essential (primary) hypertension: Secondary | ICD-10-CM | POA: Insufficient documentation

## 2012-11-06 NOTE — Progress Notes (Signed)
Reviewed home exercise with pt today.  Pt plans to walk at home and go to [employer] (with trainer) for exercise.  Reviewed THR, pulse, RPE, sign and symptoms, NTG use, and when to call 911 or MD.  Pt voiced understanding. Fabio Pierce, MA, ACSM RCEP

## 2012-11-08 ENCOUNTER — Encounter (HOSPITAL_COMMUNITY)
Admission: RE | Admit: 2012-11-08 | Discharge: 2012-11-08 | Disposition: A | Discharge status: Home / Self Care | Payer: 59 | Source: Ambulatory Visit | Attending: Cardiology | Admitting: Cardiology

## 2012-11-08 ENCOUNTER — Telehealth: Payer: Self-pay | Admitting: *Deleted

## 2012-11-08 NOTE — Telephone Encounter (Signed)
Received call from [facility] at [facility] Cardiac rehab. She states that patient's Resting BP was 86/56 HR85 and after drinking Gatorade it was 90/64 HR 85. After exercise it was 102/70 and coming off the treadmill patient complained of some dizziness. Discussed with Norma Fredrickson and she advised that he stop Norvasc 5mg  and follow up with Cardiac Rehab on Monday. Byrd Hesselbach and patient aware of above.

## 2012-11-08 NOTE — Progress Notes (Signed)
Blood pressure 86/56 upon entry to cardiac rehab this AM.  Patient asymptomatic.  Repeat blood pressure 90/64 after Gatorade then 102/70.  Dr Elvis Coil office called and notified of today's blood pressure. .. Will send exercise flow sheets for review via Careers information officer for the Doctor of the day to review.

## 2012-11-13 ENCOUNTER — Encounter (HOSPITAL_COMMUNITY)
Admission: RE | Admit: 2012-11-13 | Discharge: 2012-11-13 | Disposition: A | Discharge status: Home / Self Care | Payer: 59 | Source: Ambulatory Visit | Attending: Cardiology | Admitting: Cardiology

## 2012-11-13 NOTE — Progress Notes (Signed)
Patient returned to Cardiac rehab today. Blood pressures improved. Brandon Gonzales says he has held his lisinopril the past 3 days.  Will notify Dr Swaziland and send today's blood pressures via Careers information officer.

## 2012-11-15 ENCOUNTER — Encounter (HOSPITAL_COMMUNITY): Payer: 59

## 2012-11-18 ENCOUNTER — Encounter (HOSPITAL_COMMUNITY)
Admission: RE | Admit: 2012-11-18 | Discharge: 2012-11-18 | Disposition: A | Discharge status: Home / Self Care | Payer: 59 | Source: Ambulatory Visit | Attending: Cardiology | Admitting: Cardiology

## 2012-11-20 ENCOUNTER — Encounter (HOSPITAL_COMMUNITY)
Admission: RE | Admit: 2012-11-20 | Discharge: 2012-11-20 | Disposition: A | Discharge status: Home / Self Care | Payer: 59 | Source: Ambulatory Visit | Attending: Cardiology | Admitting: Cardiology

## 2012-11-22 ENCOUNTER — Encounter (HOSPITAL_COMMUNITY): Payer: 59

## 2012-11-25 ENCOUNTER — Telehealth: Payer: Self-pay

## 2012-11-25 ENCOUNTER — Encounter (HOSPITAL_COMMUNITY)
Admission: RE | Admit: 2012-11-25 | Discharge: 2012-11-25 | Disposition: A | Discharge status: Home / Self Care | Payer: 59 | Source: Ambulatory Visit | Attending: Cardiology | Admitting: Cardiology

## 2012-11-25 NOTE — Progress Notes (Signed)
Brandon Gonzales has  2 small micropapular bruises on his right forearm, and 2 on his left forearm. Reese noticed the bruising this past  Friday.  Cheryl Dr. Elvis Coil nurse called and notified and tell dr [place].  The patient will Notify Dr Elvis Coil office if the bruising gets worse. No complaints during exercise today

## 2012-11-25 NOTE — Telephone Encounter (Signed)
Received call from [facility] at Cardiac Rehab stating patient was complaining this morning he has noticed bruising.States has about 5 small bruises appox 1/2 cm in size on his upper body.Advised he is taking brilinta and he will bruise easy.Advised if gets worse call back.

## 2012-11-29 ENCOUNTER — Encounter (HOSPITAL_COMMUNITY)
Admission: RE | Admit: 2012-11-29 | Discharge: 2012-11-29 | Disposition: A | Discharge status: Home / Self Care | Payer: 59 | Source: Ambulatory Visit | Attending: Cardiology | Admitting: Cardiology

## 2012-12-04 ENCOUNTER — Encounter (HOSPITAL_COMMUNITY): Payer: 59

## 2012-12-06 ENCOUNTER — Encounter (HOSPITAL_COMMUNITY)
Admission: RE | Admit: 2012-12-06 | Discharge: 2012-12-06 | Disposition: A | Discharge status: Home / Self Care | Payer: 59 | Source: Ambulatory Visit | Attending: Cardiology | Admitting: Cardiology

## 2012-12-06 DIAGNOSIS — I2129 ST elevation (STEMI) myocardial infarction involving other sites: Secondary | ICD-10-CM | POA: Insufficient documentation

## 2012-12-06 DIAGNOSIS — I498 Other specified cardiac arrhythmias: Secondary | ICD-10-CM | POA: Insufficient documentation

## 2012-12-06 DIAGNOSIS — K219 Gastro-esophageal reflux disease without esophagitis: Secondary | ICD-10-CM | POA: Insufficient documentation

## 2012-12-06 DIAGNOSIS — Z9861 Coronary angioplasty status: Secondary | ICD-10-CM | POA: Insufficient documentation

## 2012-12-06 DIAGNOSIS — N289 Disorder of kidney and ureter, unspecified: Secondary | ICD-10-CM | POA: Insufficient documentation

## 2012-12-06 DIAGNOSIS — I422 Other hypertrophic cardiomyopathy: Secondary | ICD-10-CM | POA: Insufficient documentation

## 2012-12-06 DIAGNOSIS — E785 Hyperlipidemia, unspecified: Secondary | ICD-10-CM | POA: Insufficient documentation

## 2012-12-06 DIAGNOSIS — I10 Essential (primary) hypertension: Secondary | ICD-10-CM | POA: Insufficient documentation

## 2012-12-06 DIAGNOSIS — Z5189 Encounter for other specified aftercare: Secondary | ICD-10-CM | POA: Insufficient documentation

## 2012-12-06 DIAGNOSIS — I2510 Atherosclerotic heart disease of native coronary artery without angina pectoris: Secondary | ICD-10-CM | POA: Insufficient documentation

## 2012-12-11 ENCOUNTER — Encounter (HOSPITAL_COMMUNITY): Payer: 59

## 2012-12-13 ENCOUNTER — Encounter (HOSPITAL_COMMUNITY)
Admission: RE | Admit: 2012-12-13 | Discharge: 2012-12-13 | Disposition: A | Discharge status: Home / Self Care | Payer: 59 | Source: Ambulatory Visit | Attending: Cardiology | Admitting: Cardiology

## 2012-12-18 ENCOUNTER — Encounter (HOSPITAL_COMMUNITY): Payer: 59

## 2012-12-20 ENCOUNTER — Encounter (HOSPITAL_COMMUNITY): Payer: 59

## 2012-12-25 ENCOUNTER — Encounter (HOSPITAL_COMMUNITY): Payer: 59

## 2012-12-27 ENCOUNTER — Encounter (HOSPITAL_COMMUNITY): Payer: 59

## 2013-01-01 ENCOUNTER — Encounter (HOSPITAL_COMMUNITY): Payer: 59

## 2013-01-03 ENCOUNTER — Encounter (HOSPITAL_COMMUNITY): Payer: 59

## 2013-01-08 ENCOUNTER — Encounter (HOSPITAL_COMMUNITY): Payer: 59

## 2013-01-10 ENCOUNTER — Encounter (HOSPITAL_COMMUNITY): Payer: 59

## 2013-01-15 ENCOUNTER — Encounter (HOSPITAL_COMMUNITY): Payer: 59

## 2013-01-15 ENCOUNTER — Other Ambulatory Visit: Payer: 59

## 2013-01-17 ENCOUNTER — Encounter (HOSPITAL_COMMUNITY): Payer: 59

## 2013-01-17 ENCOUNTER — Other Ambulatory Visit (HOSPITAL_COMMUNITY): Payer: Self-pay | Admitting: Physician Assistant

## 2013-01-18 ENCOUNTER — Other Ambulatory Visit: Payer: Self-pay

## 2013-01-20 ENCOUNTER — Ambulatory Visit: Payer: 59 | Admitting: Cardiology

## 2013-01-21 ENCOUNTER — Other Ambulatory Visit (INDEPENDENT_AMBULATORY_CARE_PROVIDER_SITE_OTHER): Payer: 59

## 2013-01-21 DIAGNOSIS — E785 Hyperlipidemia, unspecified: Secondary | ICD-10-CM

## 2013-01-21 DIAGNOSIS — I421 Obstructive hypertrophic cardiomyopathy: Secondary | ICD-10-CM

## 2013-01-21 DIAGNOSIS — I10 Essential (primary) hypertension: Secondary | ICD-10-CM

## 2013-01-21 DIAGNOSIS — I2510 Atherosclerotic heart disease of native coronary artery without angina pectoris: Secondary | ICD-10-CM

## 2013-01-21 LAB — BASIC METABOLIC PANEL
CO2: 28 mEq/L (ref 19–32)
Calcium: 9.5 mg/dL (ref 8.4–10.5)
Chloride: 109 mEq/L (ref 96–112)
Creatinine, Ser: 1.6 mg/dL — ABNORMAL HIGH (ref 0.4–1.5)
Glucose, Bld: 88 mg/dL (ref 70–99)

## 2013-01-21 LAB — HEPATIC FUNCTION PANEL
ALT: 26 U/L (ref 0–53)
AST: 30 U/L (ref 0–37)
Albumin: 4.5 g/dL (ref 3.5–5.2)
Alkaline Phosphatase: 36 U/L — ABNORMAL LOW (ref 39–117)
Total Protein: 6.6 g/dL (ref 6.0–8.3)

## 2013-01-21 LAB — LIPID PANEL
Cholesterol: 189 mg/dL (ref 0–200)
HDL: 34.1 mg/dL — ABNORMAL LOW (ref >39.00)
LDL Cholesterol: 130 mg/dL — ABNORMAL HIGH (ref 0–99)
Total CHOL/HDL Ratio: 6
Triglycerides: 123 mg/dL (ref 0.0–149.0)
VLDL: 24.6 mg/dL (ref 0.0–40.0)

## 2013-01-22 ENCOUNTER — Encounter (HOSPITAL_COMMUNITY): Payer: 59

## 2013-01-23 ENCOUNTER — Encounter: Payer: Self-pay | Admitting: Cardiology

## 2013-01-23 ENCOUNTER — Ambulatory Visit (INDEPENDENT_AMBULATORY_CARE_PROVIDER_SITE_OTHER): Payer: 59 | Admitting: Cardiology

## 2013-01-23 VITALS — BP 130/88 | HR 59 | Ht 68.0 in | Wt 167.8 lb

## 2013-01-23 DIAGNOSIS — E785 Hyperlipidemia, unspecified: Secondary | ICD-10-CM

## 2013-01-23 DIAGNOSIS — N183 Chronic kidney disease, stage 3 unspecified: Secondary | ICD-10-CM

## 2013-01-23 DIAGNOSIS — I421 Obstructive hypertrophic cardiomyopathy: Secondary | ICD-10-CM

## 2013-01-23 DIAGNOSIS — I2510 Atherosclerotic heart disease of native coronary artery without angina pectoris: Secondary | ICD-10-CM

## 2013-01-23 DIAGNOSIS — I10 Essential (primary) hypertension: Secondary | ICD-10-CM

## 2013-01-23 MED ORDER — NITROGLYCERIN 0.4 MG SL SUBL: 0.4000 mg | SUBLINGUAL_TABLET | SUBLINGUAL | Status: DC | PRN

## 2013-01-23 MED ORDER — FENOFIBRATE 160 MG PO TABS: 160.0000 mg | ORAL_TABLET | Freq: Every day | ORAL | Status: DC

## 2013-01-23 MED ORDER — METOPROLOL TARTRATE 25 MG PO TABS: 25.0000 mg | ORAL_TABLET | Freq: Two times a day (BID) | ORAL | Status: DC

## 2013-01-23 NOTE — Patient Instructions (Signed)
We will switch Toprol to metoprolol 25 mg twice a day.  Continue your other therapy  I will see you in 6 months.

## 2013-01-24 ENCOUNTER — Encounter (HOSPITAL_COMMUNITY): Payer: 59

## 2013-01-24 NOTE — Progress Notes (Signed)
Brandon Gonzales Date of Birth: January 28, 1951 Medical Record #[id number]  History of Present Illness: Brandon Gonzales is seen back today for a follow up visit. He is s/p STEMI of the lateral wall with stenting of the mid to distal LCX and the proximal LAD with DES in October 2013. His other issues include known CAD with prior stenting of the LAD, HTN, HLD, hypertrophic CM, renal insufficiency, and past NSVT.  Since his heart attack he has made significant lifestyle modifications. He walks at least 1 mile per day. He is watching his diet carefully. His blood pressure at home has been well controlled. He does have a history of statin intolerance.  Current Outpatient Prescriptions on File Prior to Visit  Medication Sig Dispense Refill  . aspirin 81 MG tablet Take 81 mg by mouth daily.      . niacin 500 MG tablet Take 500 mg by mouth 2 (two) times daily with a meal.      . Omeprazole Magnesium (PRILOSEC OTC PO) Take by mouth as needed.       . Ticagrelor (BRILINTA) 90 MG TABS tablet Take 1 tablet (90 mg total) by mouth 2 (two) times daily.  60 tablet  3   No current facility-administered medications on file prior to visit.    Allergies  Allergen Reactions  . Antihistamines, Chlorpheniramine-Type Other (See Comments)    Altered mental status  . Statins     Leg cramps    . Zetia (Ezetimibe)     Leg cramps    Past Medical History  Diagnosis Date  . Hypertension   . Dyslipidemia     Statin-intolerant.  . H/O gastroesophageal reflux (GERD)   . Arthritis   . Coronary artery disease 2007    a. Stent to LAD,  balloon angioplasty of D1 in 2007. b. DES to LAD & LCx 10/13 in the setting of STEMI  . ST elevation myocardial infarction (STEMI) of lateral wall 09/22/2012  . Anginal pain   . Heart murmur   . GERD (gastroesophageal reflux disease)   . Cancer     SKIN CA  SQUAMOUS CELL  . Hypertrophic cardiomyopathy     Past Surgical History  Procedure Laterality Date  . Knee surgery  1993   left  . Elbow surgery  1996    right  . Cardiac catheterization  2007    stent to LAD and PTCA of diagonal branch  . Cardiac catheterization  2010  . Coronary angioplasty with stent placement  2013    95-99% prox LAD, patent LAD stent distal to this, occluded mid-dist LCx, mild <10% RCA irreg; s/p DES-prox LAD & DES to mid-dist LCx    History  Smoking status  . Never Smoker   Smokeless tobacco  . Never Used    History  Alcohol Use No    Family History  Problem Relation Age of Onset  . Heart attack Mother   . Heart disease Mother     Review of Systems: The review of systems is per the HPI.  All other systems were reviewed and are negative.  Physical Exam: BP 130/88  Pulse 59  Ht 5\' 8"  (1.727 m)  Wt 167 lb 12.8 oz (76.114 kg)  BMI 25.52 kg/m2  SpO2 99% Patient is very pleasant and in no acute distress. Skin is warm and dry. Color is normal.  HEENT is unremarkable. Normocephalic/atraumatic. PERRL. Sclera are nonicteric. Neck is supple. No masses. No JVD. Lungs are clear. Cardiac exam shows a  regular rate and rhythm. He has a harsh grade 2/6 outflow murmur at the right upper sternal border radiating to the apex.  Abdomen is soft. Extremities are without edema. Gait and ROM are intact. No gross neurologic deficits noted.   LABORATORY DATA:  Lab Results  Component Value Date   WBC 8.6 09/24/2012   HGB 15.1 09/24/2012   HCT 43.1 09/24/2012   PLT 175 09/24/2012   GLUCOSE 88 01/21/2013   CHOL 189 01/21/2013   TRIG 123.0 01/21/2013   HDL 34.10* 01/21/2013   LDLDIRECT 159.5 10/01/2012   LDLCALC 130* 01/21/2013   ALT 26 01/21/2013   AST 30 01/21/2013   NA 142 01/21/2013   K 4.4 01/21/2013   CL 109 01/21/2013   CREATININE 1.6* 01/21/2013   BUN 25* 01/21/2013   CO2 28 01/21/2013   TSH 2.353 09/22/2012   INR 1.08 09/22/2012    Assessment / Plan:  1. history of STEMI with 2 DES placed to the distal circumflex and proximal LAD.  Continue dual antiplatelet therapy for one year.  Continue efforts at rehabilitation and lifestyle modification.   2. HLD - on fenofibrate and niacin. Recent LFTs have improved. His niacin dose was reduced. He is statin intolerant. Continue current therapy.  3. Hypertrophic CM - EF 55% by echo and was 40 to 45% per cath. He is on beta blocker. Not symptomatic at this time.   4. Hypertension, well controlled.

## 2013-01-27 ENCOUNTER — Ambulatory Visit (HOSPITAL_COMMUNITY): Payer: 59

## 2013-01-29 ENCOUNTER — Encounter (HOSPITAL_COMMUNITY): Payer: 59

## 2013-01-31 ENCOUNTER — Encounter (HOSPITAL_COMMUNITY): Payer: 59

## 2013-02-01 ENCOUNTER — Other Ambulatory Visit (HOSPITAL_COMMUNITY): Payer: Self-pay | Admitting: Physician Assistant

## 2013-02-05 ENCOUNTER — Encounter (HOSPITAL_COMMUNITY): Payer: 59

## 2013-02-07 ENCOUNTER — Encounter (HOSPITAL_COMMUNITY): Payer: 59

## 2013-02-08 ENCOUNTER — Other Ambulatory Visit: Payer: Self-pay | Admitting: Cardiology

## 2013-02-12 ENCOUNTER — Telehealth: Payer: Self-pay

## 2013-02-12 ENCOUNTER — Encounter (HOSPITAL_COMMUNITY): Payer: 59

## 2013-02-12 NOTE — Telephone Encounter (Signed)
Patient called stated he does not have a PCP.Stated he has bad fever blisters and wanted a prescription for valtrex.Patient was told Dr.Jordan out of office this week, advised to go to Urgent Care.

## 2013-02-12 NOTE — Telephone Encounter (Signed)
Spoke to patient he stated he does not take amlodipine.

## 2013-02-14 ENCOUNTER — Encounter (HOSPITAL_COMMUNITY): Payer: 59

## 2013-02-15 ENCOUNTER — Ambulatory Visit (INDEPENDENT_AMBULATORY_CARE_PROVIDER_SITE_OTHER): Payer: 59

## 2013-02-19 ENCOUNTER — Telehealth: Payer: Self-pay | Admitting: *Deleted

## 2013-02-19 ENCOUNTER — Encounter (HOSPITAL_COMMUNITY): Payer: 59

## 2013-02-19 NOTE — Telephone Encounter (Signed)
Called patient to verify if still taking Amlodipine 10mg . Chart notes 11-08-12 state to stop Amlodipine but a refill request for Amlodipine 10 has been sent from [facility] on [address]. Did not get an answer nor an answering machine.   Micki Riley, CMA

## 2013-02-20 ENCOUNTER — Telehealth: Payer: Self-pay

## 2013-02-20 NOTE — Telephone Encounter (Signed)
Called and spoke to patient and verified he has not taken Amlodipine for a few months now. Going to call [facility] and let them know as well and to stop sending refill request for Amlodipine.

## 2013-02-21 ENCOUNTER — Encounter (HOSPITAL_COMMUNITY): Payer: 59

## 2013-05-20 ENCOUNTER — Other Ambulatory Visit (HOSPITAL_COMMUNITY): Payer: Self-pay | Admitting: Cardiology

## 2013-06-23 ENCOUNTER — Other Ambulatory Visit: Payer: Self-pay | Admitting: Cardiology

## 2013-06-23 NOTE — Telephone Encounter (Signed)
metoprolol tartrate (LOPRESSOR) 25 MG tablet 180 tablet 3 01/23/2013

## 2013-07-12 ENCOUNTER — Other Ambulatory Visit (HOSPITAL_COMMUNITY): Payer: Self-pay | Admitting: Cardiology

## 2013-09-19 ENCOUNTER — Other Ambulatory Visit (HOSPITAL_COMMUNITY): Payer: Self-pay | Admitting: Cardiology

## 2013-10-09 ENCOUNTER — Other Ambulatory Visit: Payer: Self-pay

## 2013-10-18 ENCOUNTER — Other Ambulatory Visit (HOSPITAL_COMMUNITY): Payer: Self-pay | Admitting: Cardiology

## 2013-11-22 IMAGING — CR DG CHEST 2V
2 series · 2 of 2 positions shown · non-contrast
Comparison: 05/31/2011

CLINICAL DATA: Weakness.  Dizziness.  Chest palpitations.  Prior
cardiac angioplasty.  Hypertension.

CHEST - 2 VIEW

[w chest pa]
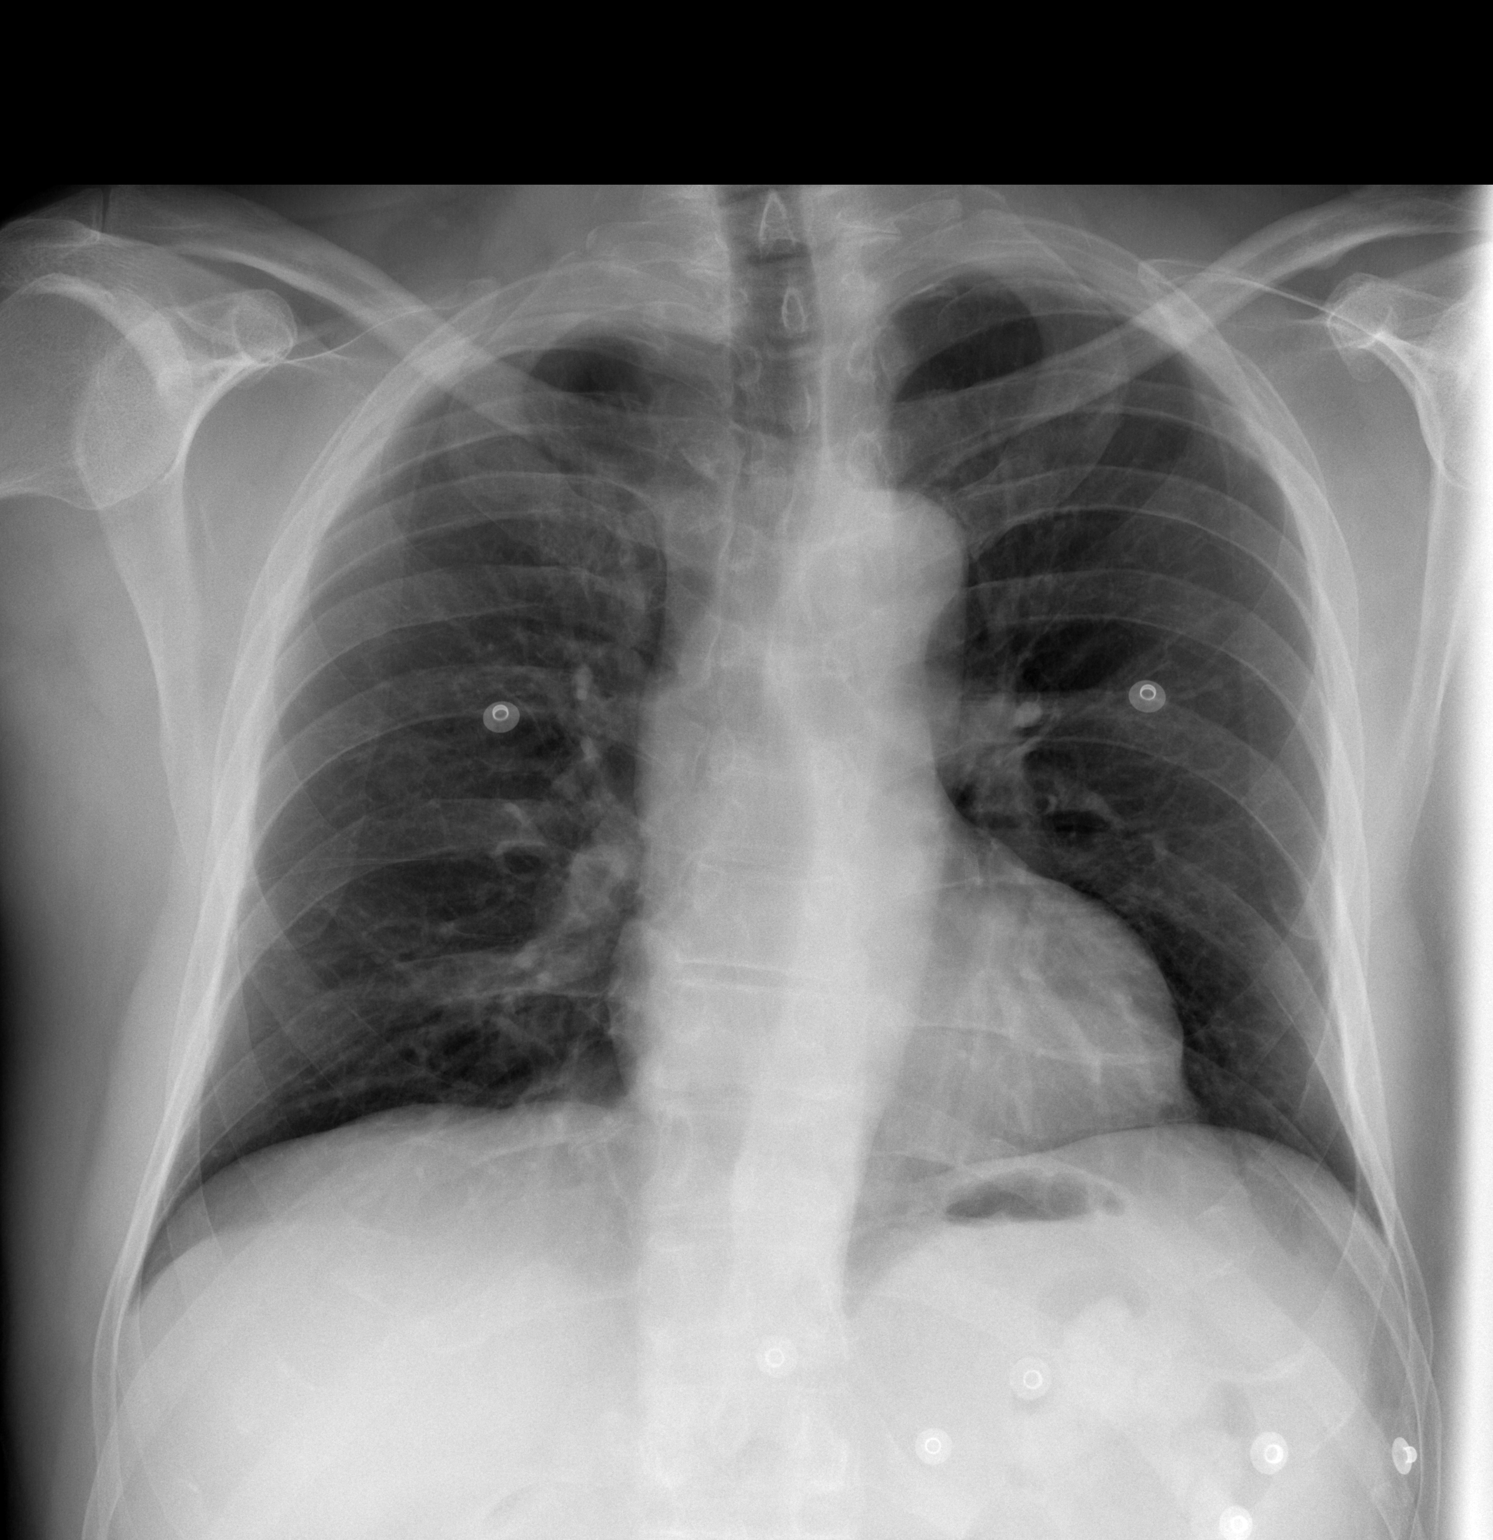

[w chest lat]
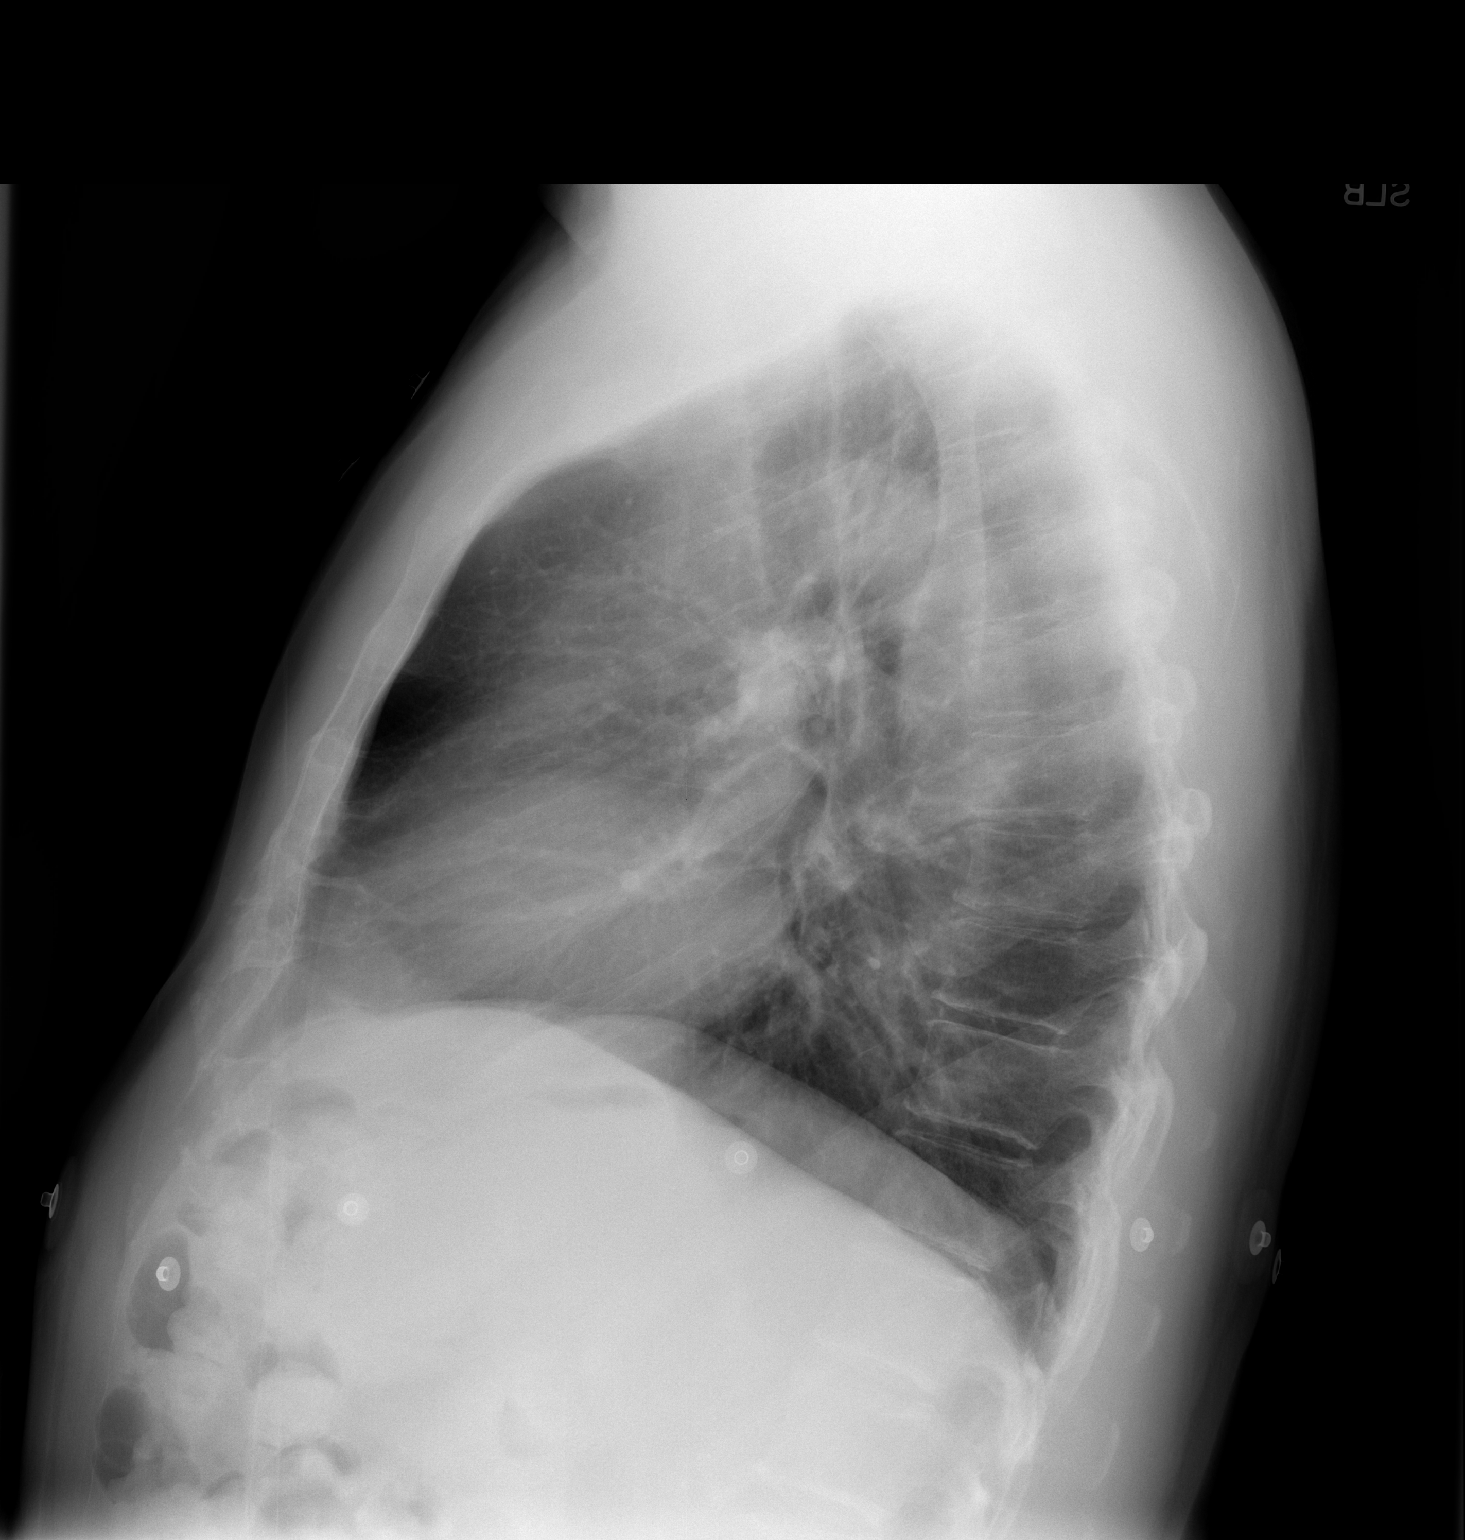

[2 of 2 positions shown; findings below may reference images not displayed]

FINDINGS: Cardiac and mediastinal contours appear normal.

The lungs appear clear.

No pleural effusion is identified.
IMPRESSION: No significant abnormality identified.

## 2014-05-19 ENCOUNTER — Ambulatory Visit (INDEPENDENT_AMBULATORY_CARE_PROVIDER_SITE_OTHER): Payer: Medicare Other | Admitting: Family Medicine

## 2014-05-19 VITALS — BP 132/84 | HR 83 | Temp 98.0°F | Resp 16 | Ht 67.5 in | Wt 171.0 lb

## 2014-05-19 DIAGNOSIS — H8110 Benign paroxysmal vertigo, unspecified ear: Secondary | ICD-10-CM

## 2014-05-19 NOTE — Progress Notes (Signed)
[facility] [address] [place] [address] [phone]  Date:  05/19/2014   Name:  Brandon Gonzales   DOB:  16-Sep-1951   MRN:  [id number]  PCP:  Robyn Haber, MD    Chief Complaint: Dizziness   History of Present Illness:  Brandon Gonzales is a 63 y.o. very pleasant male patient who presents with the following:  History of STEMI in 2013 with angioplasty.  Also history of HTN and dyslipidemia, hypertrophic cardiomyopathy.  His cardiologist is Dr. Martinique Here today with vertigo- he has had this in the past.  Sx started about 3 weeks ago- will come and go. Over the last 2 or 3 days it has been more persistent.  The vertigo occurs if he moves his head, especially with looking up.  No vomiting, no nausea.  No headache.  No hearing changes.  He has noted some changes in his vision- he had lasix in 2005.  He has noted these vision changes- especially with his distance vision- for a few weeks at least.  He is seeing his eye doctor tomorrow.   He has not noted any CP or SOB.  He is now off of BP medications   He did see an ENT a few years back for this problem- had Epley manuver but this did not seem to help  He has used sudafed with antihistamine in the past which caused him to feel "loopy and lightheaded."  No rash, SOB or hives however.  He would be open to trying some dramamine   Patient Active Problem List   Diagnosis Date Noted  . NSVT (nonsustained ventricular tachycardia) 09/25/2012  . Hypertrophic obstructive cardiomyopathy 09/25/2012  . CKD (chronic kidney disease) stage 3, GFR 30-59 ml/min 09/25/2012  . ST elevation myocardial infarction (STEMI) of lateral wall 09/22/2012  . CAD (coronary artery disease) 01/11/2012  . HTN (hypertension) 01/11/2012  . Dyslipidemia   . H/O gastroesophageal reflux (GERD)   . Arthritis     Past Medical History  Diagnosis Date  . Hypertension   . Dyslipidemia     Statin-intolerant.  . H/O gastroesophageal reflux  (GERD)   . Arthritis   . Coronary artery disease 2007    a. Stent to LAD,  balloon angioplasty of D1 in 2007. b. DES to LAD & LCx 10/13 in the setting of STEMI  . ST elevation myocardial infarction (STEMI) of lateral wall 09/22/2012  . Anginal pain   . Heart murmur   . GERD (gastroesophageal reflux disease)   . Cancer     SKIN CA  SQUAMOUS CELL  . Hypertrophic cardiomyopathy     Past Surgical History  Procedure Laterality Date  . Knee surgery  1993    left  . Elbow surgery  1996    right  . Cardiac catheterization  2007    stent to LAD and PTCA of diagonal branch  . Cardiac catheterization  2010  . Coronary angioplasty with stent placement  2013    95-99% prox LAD, patent LAD stent distal to this, occluded mid-dist LCx, mild <10% RCA irreg; s/p DES-prox LAD & DES to mid-dist LCx    History  Substance Use Topics  . Smoking status: Never Smoker   . Smokeless tobacco: Never Used  . Alcohol Use: No    Family History  Problem Relation Age of Onset  . Heart attack Mother   . Heart disease Mother     Allergies  Allergen Reactions  . Antihistamines, Chlorpheniramine-Type  Other (See Comments)    Altered mental status  . Statins     Leg cramps    . Zetia [Ezetimibe]     Leg cramps    Medication list has been reviewed and updated.  Current Outpatient Prescriptions on File Prior to Visit  Medication Sig Dispense Refill  . aspirin 81 MG tablet Take 81 mg by mouth daily.      . niacin 500 MG tablet Take 500 mg by mouth 2 (two) times daily with a meal.       No current facility-administered medications on file prior to visit.    Review of Systems:  As per HPI- otherwise negative.   Physical Examination: Filed Vitals:   05/19/14 1540  BP: 132/84  Pulse: 83  Temp: 98 F (36.7 C)  Resp: 16   Filed Vitals:   05/19/14 1540  Height: 5' 7.5" (1.715 m)  Weight: 171 lb (77.565 kg)   Body mass index is 26.37 kg/(m^2). Ideal Body Weight: Weight in (lb) to have BMI  = 25: 161.7  GEN: WDWN, NAD, Non-toxic, A & O x 3, looks well and quite fit for age 29: Atraumatic, Normocephalic. Neck supple. No masses, No LAD.  Bilateral TM wnl, oropharynx normal.  PEERL,EOMI.   Ears and Nose: No external deformity. CV: RRR, No M/G/R. No JVD. No thrill. No extra heart sounds. PULM: CTA B, no wheezes, crackles, rhonchi. No retractions. No resp. distress. No accessory muscle use. ABD: S, NT, ND, +BS. No rebound. No HSM. EXTR: No c/c/e NEURO Normal gait.  PSYCH: Normally interactive. Conversant. Not depressed or anxious appearing.  Calm demeanor.  Complete neuro exam wnl, except I am able to reproduce vertigo with dix- halpike.  Normal strength, sensation and DTR all extremities, normal facial movement, normal romberg and gait.   Assessment and Plan: Benign paroxysmal positional vertigo  Discussed in detail with pt.   See patient instructions for more details.     Signed Lamar Blinks, MD

## 2014-05-19 NOTE — Patient Instructions (Signed)
You seem to have BPPV- a common and benign cause of vertigo. If your symptoms do not go away soon we can send you back to ENT to try treatment again.  You can try some OTC dramamine but this could give you some of the symptoms you had with antihistamines in the past.  Let me know if you are not feeling better soon!

## 2014-06-20 ENCOUNTER — Ambulatory Visit (INDEPENDENT_AMBULATORY_CARE_PROVIDER_SITE_OTHER): Payer: Medicare HMO | Admitting: Family Medicine

## 2014-06-20 VITALS — BP 138/90 | HR 75 | Temp 98.3°F | Resp 16 | Ht 68.0 in | Wt 170.0 lb

## 2014-06-20 DIAGNOSIS — B009 Herpesviral infection, unspecified: Secondary | ICD-10-CM

## 2014-06-20 DIAGNOSIS — B001 Herpesviral vesicular dermatitis: Secondary | ICD-10-CM

## 2014-06-20 MED ORDER — VALACYCLOVIR HCL 500 MG PO TABS: 500.0000 mg | ORAL_TABLET | Freq: Every day | ORAL | Status: DC

## 2014-06-20 NOTE — Progress Notes (Addendum)
Subjective:    Patient ID: Brandon Gonzales, male    DOB: 1951-11-25, 63 y.o.   MRN: [id number] This chart was scribed for Merri Ray, MD by Vernell Barrier, Medical Scribe. The patient was seen in room 14. This patient's care was started at 9:26 AM.   HPI HPI Comments: Brandon Gonzales is a 63 y.o. male who [facility] medication refill for Valtrex. Hx of multiple medical problems including CAD. Status post ST elevation MI on 09/22/2012 followed by Dr. Martinique. PCP here identified as Dr. Joseph Art  Current blister onset 3 days prior. States he usually takes Valtrex when he can feel a flare up coming on. Reports 4-5 flare ups annually; treated with 2 pills morning, 2 pills nightly for 4-5 days and sxs resolve. Last dosage at 500 mg. States last visit with Dr. Marin Comment; she increased his number of pills so he could take more as needed. No hx blisters at groin or genital area. Reports he has been in [facility] since July 4th and just returned last night; flares ups usually triggered by being out in the sun for extended periods of time. Also takes 81 mg aspirin daily.  Patient Active Problem List   Diagnosis Date Noted  . NSVT (nonsustained ventricular tachycardia) 09/25/2012  . Hypertrophic obstructive cardiomyopathy 09/25/2012  . CKD (chronic kidney disease) stage 3, GFR 30-59 ml/min 09/25/2012  . ST elevation myocardial infarction (STEMI) of lateral wall 09/22/2012  . CAD (coronary artery disease) 01/11/2012  . HTN (hypertension) 01/11/2012  . Dyslipidemia   . H/O gastroesophageal reflux (GERD)   . Arthritis    Past Medical History  Diagnosis Date  . Hypertension   . Dyslipidemia     Statin-intolerant.  . H/O gastroesophageal reflux (GERD)   . Arthritis   . Coronary artery disease 2007    a. Stent to LAD,  balloon angioplasty of D1 in 2007. b. DES to LAD & LCx 10/13 in the setting of STEMI  . ST elevation myocardial infarction (STEMI) of  lateral wall 09/22/2012  . Anginal pain   . Heart murmur   . GERD (gastroesophageal reflux disease)   . Cancer     SKIN CA  SQUAMOUS CELL  . Hypertrophic cardiomyopathy    Past Surgical History  Procedure Laterality Date  . Knee surgery  1993    left  . Elbow surgery  1996    right  . Cardiac catheterization  2007    stent to LAD and PTCA of diagonal branch  . Cardiac catheterization  2010  . Coronary angioplasty with stent placement  2013    95-99% prox LAD, patent LAD stent distal to this, occluded mid-dist LCx, mild <10% RCA irreg; s/p DES-prox LAD & DES to mid-dist LCx   Allergies  Allergen Reactions  . Antihistamines, Chlorpheniramine-Type Other (See Comments)    Altered mental status  . Statins     Leg cramps    . Zetia [Ezetimibe]     Leg cramps   Prior to Admission medications   Medication Sig Start Date End Date Taking? Authorizing Provider  aspirin 81 MG tablet Take 81 mg by mouth daily.   Yes Historical Provider, MD  niacin 500 MG tablet Take 500 mg by mouth 2 (two) times daily with a meal.   Yes Historical Provider, MD   History   Social History  . Marital Status: Married    Spouse Name: N/A    Number of  Children: N/A  . Years of Education: N/A   Occupational History  . Not on file.   Social History Main Topics  . Smoking status: Never Smoker   . Smokeless tobacco: Never Used  . Alcohol Use: No  . Drug Use: No  . Sexual Activity: Not Currently   Other Topics Concern  . Not on file   Social History Narrative  . No narrative on file   Review of Systems  HENT: Positive for mouth sores.   All other systems reviewed and are negative.  Objective:  Physical Exam  Vitals reviewed. Constitutional: He is oriented to person, place, and time. He appears well-developed and well-nourished. No distress.  HENT:  Head: Normocephalic and atraumatic.  Mouth/Throat: Oropharynx is clear and moist.  Left lower lip there are multiple scabs with vesicular  areas on the lip itself.  Eyes: Conjunctivae and EOM are normal.  Neck: Neck supple.  Cardiovascular: Normal rate and regular rhythm.   Murmur heard.  Systolic murmur is present with a grade of 2/6  Pulmonary/Chest: Effort normal and breath sounds normal. He has no wheezes. He has no rales.  Musculoskeletal: Normal range of motion.  Lymphadenopathy:    He has no cervical adenopathy.  Neurological: He is alert and oriented to person, place, and time.  Skin: Skin is warm and dry.  Psychiatric: He has a normal mood and affect. His behavior is normal.   Filed Vitals:   06/20/14 0842  BP: 138/90  Pulse: 75  Temp: 98.3 F (36.8 C)  Resp: 16  Height: 5\' 8"  (1.727 m)  Weight: 170 lb (77.111 kg)  SpO2: 97%   Assessment & Plan:   Brandon Gonzales is a 63 y.o. male Herpes labialis - Plan: valACYclovir (VALTREX) 500 MG tablet Recurrent, with sun exposure as inciting event.   -lip sunscreen, Abbreva ok to use when out in sun.   -trial of daily Valtrex, then increase to 2 grams once, repeat dose once in 12 hours if flair.   -rtc precautions.   Recommended setting up physical with PCP.   Meds ordered this encounter  Medications  . valACYclovir (VALTREX) 500 MG tablet    Sig: Take 1 tablet (500 mg total) by mouth daily.    Dispense:  90 tablet    Refill:  3   Patient Instructions  Sunscreen for lips, and abbreva when out in sun may help prevent recurrence. We can try once per day dosing of Valtrex for now. IF you do have signs or symptoms of a cold sore outbreak take 4 pills all at once and repeat this dose once in 12 hours. Let me know if you have any side effects from daily dosing of Valtrex and as above you can always try preventative treatment with just sunscreen and abbreva if you would like.   Call to schedule a physical with you primary care provider within the next 6 months if possible. Return to the clinic or go to the nearest emergency room if any of your symptoms worsen or new  symptoms occur.   I personally performed the services described in this documentation, which was scribed in my presence. The recorded information has been reviewed and considered, and addended by me as needed.

## 2014-06-20 NOTE — Patient Instructions (Signed)
Sunscreen for lips, and abbreva when out in sun may help prevent recurrence. We can try once per day dosing of Valtrex for now. IF you do have signs or symptoms of a cold sore outbreak take 4 pills all at once and repeat this dose once in 12 hours. Let me know if you have any side effects from daily dosing of Valtrex and as above you can always try preventative treatment with just sunscreen and abbreva if you would like.   Call to schedule a physical with you primary care provider within the next 6 months if possible. Return to the clinic or go to the nearest emergency room if any of your symptoms worsen or new symptoms occur.

## 2014-09-03 ENCOUNTER — Telehealth: Payer: Self-pay

## 2014-09-03 NOTE — Telephone Encounter (Signed)
Patient called requesting a CB with e-mail address,  Advised patient we cannot e-mail.  He will complete the MROI and drop it off Friday morning.  Information needs to be to his doctors office for an appointment on Monday,.  Needs his June OV sent over.  [phone]

## 2014-11-12 ENCOUNTER — Encounter (HOSPITAL_COMMUNITY): Payer: Self-pay | Admitting: Cardiology

## 2015-05-31 ENCOUNTER — Other Ambulatory Visit: Payer: Self-pay

## 2016-04-24 DIAGNOSIS — R42 Dizziness and giddiness: Secondary | ICD-10-CM | POA: Diagnosis not present

## 2016-04-24 DIAGNOSIS — H8110 Benign paroxysmal vertigo, unspecified ear: Secondary | ICD-10-CM | POA: Diagnosis not present

## 2016-04-24 DIAGNOSIS — IMO0002 Reserved for concepts with insufficient information to code with codable children: Secondary | ICD-10-CM | POA: Insufficient documentation

## 2016-09-12 DIAGNOSIS — Z85828 Personal history of other malignant neoplasm of skin: Secondary | ICD-10-CM | POA: Diagnosis not present

## 2016-09-12 DIAGNOSIS — C44529 Squamous cell carcinoma of skin of other part of trunk: Secondary | ICD-10-CM | POA: Diagnosis not present

## 2016-09-12 DIAGNOSIS — D485 Neoplasm of uncertain behavior of skin: Secondary | ICD-10-CM | POA: Diagnosis not present

## 2016-09-12 DIAGNOSIS — L821 Other seborrheic keratosis: Secondary | ICD-10-CM | POA: Diagnosis not present

## 2016-09-12 DIAGNOSIS — D692 Other nonthrombocytopenic purpura: Secondary | ICD-10-CM | POA: Diagnosis not present

## 2016-09-12 DIAGNOSIS — L570 Actinic keratosis: Secondary | ICD-10-CM | POA: Diagnosis not present

## 2016-09-12 DIAGNOSIS — L812 Freckles: Secondary | ICD-10-CM | POA: Diagnosis not present

## 2016-12-14 ENCOUNTER — Encounter: Payer: Self-pay | Admitting: Adult Health

## 2016-12-14 ENCOUNTER — Ambulatory Visit (INDEPENDENT_AMBULATORY_CARE_PROVIDER_SITE_OTHER): Payer: Medicare HMO | Admitting: Adult Health

## 2016-12-14 VITALS — BP 183/87 | HR 70 | Resp 17 | Ht 67.25 in | Wt 164.0 lb

## 2016-12-14 DIAGNOSIS — I10 Essential (primary) hypertension: Secondary | ICD-10-CM

## 2016-12-14 DIAGNOSIS — B001 Herpesviral vesicular dermatitis: Secondary | ICD-10-CM | POA: Diagnosis not present

## 2016-12-14 DIAGNOSIS — H8113 Benign paroxysmal vertigo, bilateral: Secondary | ICD-10-CM | POA: Diagnosis not present

## 2016-12-14 MED ORDER — LISINOPRIL 10 MG PO TABS: 10.0000 mg | ORAL_TABLET | Freq: Every day | ORAL | 3 refills | Status: DC

## 2016-12-14 MED ORDER — VALACYCLOVIR HCL 500 MG PO TABS: 500.0000 mg | ORAL_TABLET | Freq: Every day | ORAL | 3 refills | Status: DC

## 2016-12-14 NOTE — Patient Instructions (Signed)
Dizziness Dizziness is a common problem. It makes you feel unsteady or lightheaded. You may feel like you are about to pass out (faint). Dizziness can lead to injury if you stumble or fall. Anyone can get dizzy, but dizziness is more common in older adults. This condition can be caused by a number of things, including:  Medicines.  Dehydration.  Illness. Follow these instructions at home: Following these instructions may help with your condition: Eating and drinking  Drink enough fluid to keep your pee (urine) clear or pale yellow. This helps to keep you from getting dehydrated. Try to drink more clear fluids, such as water.  Do not drink alcohol.  Limit how much caffeine you drink or eat if told by your doctor.  Limit how much salt you drink or eat if told by your doctor. Activity  Avoid making quick movements.  When you stand up from sitting in a chair, steady yourself until you feel okay.  In the morning, first sit up on the side of the bed. When you feel okay, stand slowly while you hold onto something. Do this until you know that your balance is fine.  Move your legs often if you need to stand in one place for a long time. Tighten and relax your muscles in your legs while you are standing.  Do not drive or use heavy machinery if you feel dizzy.  Avoid bending down if you feel dizzy. Place items in your home so that they are easy for you to reach without leaning over. Lifestyle  Do not use any tobacco products, including cigarettes, chewing tobacco, or electronic cigarettes. If you need help quitting, ask your doctor.  Try to lower your stress level, such as with yoga or meditation. Talk with your doctor if you need help. General instructions  Watch your dizziness for any changes.  Take medicines only as told by your doctor. Talk with your doctor if you think that your dizziness is caused by a medicine that you are taking.  Tell a friend or a family member that you are  feeling dizzy. If he or she notices any changes in your behavior, have this person call your doctor.  Keep all follow-up visits as told by your doctor. This is important. Contact a doctor if:  Your dizziness does not go away.  Your dizziness or light-headedness gets worse.  You feel sick to your stomach (nauseous).  You have trouble hearing.  You have new symptoms.  You are unsteady on your feet or you feel like the room is spinning. Get help right away if:  You throw up (vomit) or have diarrhea and are unable to eat or drink anything.  You have trouble:  Talking.  Walking.  Swallowing.  Using your arms, hands, or legs.  You feel generally weak.  You are not thinking clearly or you have trouble forming sentences. It may take a friend or family member to notice this.  You have:  Chest pain.  Pain in your belly (abdomen).  Shortness of breath.  Sweating.  Your vision changes.  You are bleeding.  You have a headache.  You have neck pain or a stiff neck.  You have a fever. This information is not intended to replace advice given to you by your health care provider. Make sure you discuss any questions you have with your health care provider. Document Released: 11/09/2011 Document Revised: 04/27/2016 Document Reviewed: 11/16/2014 Elsevier Interactive Patient Education  2017 [employer] Hypertension Hypertension, commonly called  high blood pressure, is when the force of blood pumping through your arteries is too strong. Your arteries are the blood vessels that carry blood from your heart throughout your body. A blood pressure reading consists of a higher number over a lower number, such as 110/72. The higher number (systolic) is the pressure inside your arteries when your heart pumps. The lower number (diastolic) is the pressure inside your arteries when your heart relaxes. Ideally you want your blood pressure below 120/80. Hypertension forces your heart to work  harder to pump blood. Your arteries may become narrow or stiff. Having untreated or uncontrolled hypertension can cause heart attack, stroke, kidney disease, and other problems. What increases the risk? Some risk factors for high blood pressure are controllable. Others are not. Risk factors you cannot control include:  Race. You may be at higher risk if you are African American.  Age. Risk increases with age.  Gender. Men are at higher risk than women before age 25 years. After age 14, women are at higher risk than men. Risk factors you can control include:  Not getting enough exercise or physical activity.  Being overweight.  Getting too much fat, sugar, calories, or salt in your diet.  Drinking too much alcohol. What are the signs or symptoms? Hypertension does not usually cause signs or symptoms. Extremely high blood pressure (hypertensive crisis) may cause headache, anxiety, shortness of breath, and nosebleed. How is this diagnosed? To check if you have hypertension, your health care provider will measure your blood pressure while you are seated, with your arm held at the level of your heart. It should be measured at least twice using the same arm. Certain conditions can cause a difference in blood pressure between your right and left arms. A blood pressure reading that is higher than normal on one occasion does not mean that you need treatment. If it is not clear whether you have high blood pressure, you may be asked to return on a different day to have your blood pressure checked again. Or, you may be asked to monitor your blood pressure at home for 1 or more weeks. How is this treated? Treating high blood pressure includes making lifestyle changes and possibly taking medicine. Living a healthy lifestyle can help lower high blood pressure. You may need to change some of your habits. Lifestyle changes may include:  Following the DASH diet. This diet is high in fruits, vegetables, and  whole grains. It is low in salt, red meat, and added sugars.  Keep your sodium intake below 2,300 mg per day.  Getting at least 30-45 minutes of aerobic exercise at least 4 times per week.  Losing weight if necessary.  Not smoking.  Limiting alcoholic beverages.  Learning ways to reduce stress. Your health care provider may prescribe medicine if lifestyle changes are not enough to get your blood pressure under control, and if one of the following is true:  You are 29-73 years of age and your systolic blood pressure is above 140.  You are 74 years of age or older, and your systolic blood pressure is above 150.  Your diastolic blood pressure is above 90.  You have diabetes, and your systolic blood pressure is over [id number] or your diastolic blood pressure is over 90.  You have kidney disease and your blood pressure is above 140/90.  You have heart disease and your blood pressure is above 140/90. Your personal target blood pressure may vary depending on your medical conditions, your age,  and other factors. Follow these instructions at home:  Have your blood pressure rechecked as directed by your health care provider.  Take medicines only as directed by your health care provider. Follow the directions carefully. Blood pressure medicines must be taken as prescribed. The medicine does not work as well when you skip doses. Skipping doses also puts you at risk for problems.  Do not smoke.  Monitor your blood pressure at home as directed by your health care provider. Contact a health care provider if:  You think you are having a reaction to medicines taken.  You have recurrent headaches or feel dizzy.  You have swelling in your ankles.  You have trouble with your vision. Get help right away if:  You develop a severe headache or confusion.  You have unusual weakness, numbness, or feel faint.  You have severe chest or abdominal pain.  You vomit repeatedly.  You have trouble  breathing. This information is not intended to replace advice given to you by your health care provider. Make sure you discuss any questions you have with your health care provider. Document Released: 11/20/2005 Document Revised: 04/27/2016 Document Reviewed: 09/12/2013 Elsevier Interactive Patient Education  2017 [employer].

## 2016-12-14 NOTE — Addendum Note (Signed)
Addended byLillard Anes D on: 12/14/2016 09:36 AM   Modules accepted: Orders

## 2016-12-14 NOTE — Progress Notes (Signed)
   Subjective:    Patient ID: Brandon Gonzales, male    DOB: Dec 06, 1950, 66 y.o.   MRN: [id number]  HPI:  Brandon Gonzales presents to establish as a new pt.   He has been using UC for  management of chronic conditions for the last 3 years.  Last visit with Cardiologist was 2014.  He feels well generally, however experiences episodes of Vertigo (estimates 3-4 times annually) and has been "dizzy for about a week with some mild nausea".      Review of Systems  Constitutional: Negative.  Negative for activity change and fatigue.  HENT: Negative for trouble swallowing.   Respiratory: Negative for chest tightness, shortness of breath and wheezing.   Cardiovascular: Negative for chest pain, palpitations and leg swelling.  Gastrointestinal: Positive for nausea. Negative for vomiting.  Endocrine: Negative for cold intolerance, heat intolerance, polydipsia, polyphagia and polyuria.  Genitourinary: Negative for difficulty urinating.  Musculoskeletal: Negative for arthralgias.  Neurological: Positive for dizziness. Negative for tremors, weakness, light-headedness and headaches.  Psychiatric/Behavioral: Negative for agitation, behavioral problems and confusion. The patient is not nervous/anxious.        Objective:   Physical Exam  Constitutional: He is oriented to person, place, and time. He appears well-developed and well-nourished.  HENT:  Head: Normocephalic and atraumatic.  Right Ear: External ear normal.  Left Ear: External ear normal.  Mouth/Throat: Oropharynx is clear and moist.  Eyes: Conjunctivae and EOM are normal. Pupils are equal, round, and reactive to light.  Neck: Normal range of motion. Neck supple. No tracheal deviation present. No thyromegaly present.  Cardiovascular: Normal rate, regular rhythm and intact distal pulses.  Exam reveals no gallop and no friction rub.   Murmur heard. Pulmonary/Chest: Effort normal and breath sounds normal. No respiratory distress. He has no wheezes.   Abdominal: Soft. Bowel sounds are normal.  Musculoskeletal: Normal range of motion.  Neurological: He is alert and oriented to person, place, and time. He has normal reflexes.  Skin: Skin is warm and dry.  Psychiatric: He has a normal mood and affect. His behavior is normal. Judgment and thought content normal.          Assessment & Plan:  1. HTN-reduce sodium.  Start on Rx.  Return in 2 weeks for lab work and BP re-check.   2-Vertigo-increase fluids, avoid strenuous exercise during acute sx's. All questions/concerns answered.  Brandon Gonzales d. Arvil Persons, NP-C

## 2016-12-19 ENCOUNTER — Telehealth: Payer: Self-pay | Admitting: Cardiology

## 2016-12-19 NOTE — Telephone Encounter (Signed)
Spoke to patient. He's a pt seen by Dr. Martinique in the past and last evaluated in 2014. Hx HTN, CAD w PCI. As patient recounts, had MI in 2013 and went through w cardiac rehab following year, got off all his medications, was doing quite well. Unclear why he did not continue to follow up w Dr. Martinique at routine intervals, but he called today to reestablish care. Notes he was seen by new PCP w [facility] recently (had also gone w/o seeing his PCP at [facility] for 2 years & unable to follow up w them) due to some concerns. Explains that he'd been feeling "unwell" for quite some time, found that his BP was elevated at the PCP office a few days ago. He was started on lisinopril w advice to follow up/reestablish w cardiology. Pt expressed he's very nervous about his BP being so high, wants to be seen.  I offered APP appt for this week to which he was agreeable. I advised to continue the lisinopril as instructed, plan for further advice to follow at appt.  He voiced understanding and thanks. Notes he'll want to follow up w Dr. Martinique after that visit.  I gave him all appt information including new office location (was last seen at [facility]) - he voiced acknowledgment and is aware he may call if he needs further information.

## 2016-12-19 NOTE — Telephone Encounter (Signed)
Pt wants to be seen asap,he was last seen here in 2014 by Dr  Martinique. Pt;s blood pressure is running  high,today it was 199/99.He said about 2 days ago it was 208/110. He went to primary doctor on Friday,he was put on Lisinopril 10 mg and blood pressure is still running high.

## 2016-12-21 ENCOUNTER — Ambulatory Visit: Payer: Medicare HMO | Admitting: Nurse Practitioner

## 2016-12-27 ENCOUNTER — Ambulatory Visit (INDEPENDENT_AMBULATORY_CARE_PROVIDER_SITE_OTHER): Payer: Medicare HMO | Admitting: Nurse Practitioner

## 2016-12-27 ENCOUNTER — Encounter: Payer: Self-pay | Admitting: Nurse Practitioner

## 2016-12-27 VITALS — BP 146/96 | HR 66 | Ht 67.25 in | Wt 167.4 lb

## 2016-12-27 DIAGNOSIS — I10 Essential (primary) hypertension: Secondary | ICD-10-CM | POA: Diagnosis not present

## 2016-12-27 DIAGNOSIS — I2510 Atherosclerotic heart disease of native coronary artery without angina pectoris: Secondary | ICD-10-CM | POA: Diagnosis not present

## 2016-12-27 DIAGNOSIS — I422 Other hypertrophic cardiomyopathy: Secondary | ICD-10-CM

## 2016-12-27 DIAGNOSIS — E782 Mixed hyperlipidemia: Secondary | ICD-10-CM | POA: Diagnosis not present

## 2016-12-27 MED ORDER — LISINOPRIL 20 MG PO TABS: 20.0000 mg | ORAL_TABLET | Freq: Every day | ORAL | 1 refills | Status: DC

## 2016-12-27 NOTE — Patient Instructions (Addendum)
Medication Instructions:  Your physician has recommended you make the following change in your medication:  1.  INCREASE the Lisinopril to 20 mg taking 1 tablet daily   Labwork: None ordered  Testing/Procedures: None ordered  Follow-Up:. Your physician wants you to follow-up in: 4-6 MONTHS WITH DR. Martinique   You will receive a reminder letter in the mail two months in advance. If you don't receive a letter, please call our office to schedule the follow-up appointment.   Any Other Special Instructions Will Be Listed Below (If Applicable).    If you need a refill on your cardiac medications before your next appointment, please call your pharmacy.

## 2016-12-27 NOTE — Progress Notes (Signed)
Office Visit    Patient Name: Brandon Gonzales Date of Encounter: 12/27/2016  Primary Care Provider:  Lillard Anes, NP Primary Cardiologist:  [name], MD   Chief Complaint    66 y/o ? with a h/o CAD, HTN, hypertrophic cm, GERD, and HL, who presents for f/u related to elevated bp's.  Past Medical History    Past Medical History:  Diagnosis Date  . Arthritis   . Cancer (HCC)    SKIN CA  SQUAMOUS CELL  . Coronary artery disease    a. 2007 Stent to LAD, PTCA D1;  b. DES to LAD & LCx 10/13 in the setting of STEMI.  [name] Kitchen Dyslipidemia    a. Statin and zetia-intolerant. On niacin.  [name] Kitchen GERD (gastroesophageal reflux disease)   . Heart murmur   . Hypertension   . Hypertrophic cardiomyopathy ([place])    a. 09/2012 Echo: EF 55%, mid to apical anterolateral, posterior, apical anterior, apical HK, Gr1 DD, turbulence across LVOT suggesting degree of obstruction, mild MR, mildly dil LA.   Past Surgical History:  Procedure Laterality Date  . CARDIAC CATHETERIZATION  2007   stent to LAD and PTCA of diagonal branch  . CARDIAC CATHETERIZATION  2010  . CORONARY ANGIOPLASTY WITH STENT PLACEMENT  2013   95-99% prox LAD, patent LAD stent distal to this, occluded mid-dist LCx, mild <10% RCA irreg; s/p DES-prox LAD & DES to mid-dist LCx  . [place]   right  . [place]   left  . LEFT HEART CATH Bilateral 09/22/2012   Procedure: LEFT HEART CATH;  Surgeon: Peter M Martinique, MD;  Location: [facility] CATH LAB;  Service: Cardiovascular;  Laterality: Bilateral;  . PERCUTANEOUS CORONARY STENT INTERVENTION (PCI-S) Right 09/22/2012   Procedure: PERCUTANEOUS CORONARY STENT INTERVENTION (PCI-S);  Surgeon: Peter M Martinique, MD;  Location: [facility] CATH LAB;  Service: Cardiovascular;  Laterality: Right;    Allergies  Allergies  Allergen Reactions  . Antihistamines, Chlorpheniramine-Type Other (See Comments)    Altered mental status  . Pheniramine Other (See Comments)    Altered mental status  . Statins      Leg cramps    . Zetia [Ezetimibe]     Leg cramps    History of Present Illness    66 y/o ? with the above complex PMH including CAD prior LAD stenting, PTCA to D1 in 2007, followed by lateral STEMI and PCI/DES to the LCX & LAD in 2013.  He also has a h/o HTN, HL, GERD, and hypertrophic cardiomyopathy.  He says that after completing cardiac rehab in 2013, he ended up coming off of bp meds due to low blood pressures. He was last seen in clinic in 2014 @ which time he was doing well.  He has remained active over the years and has not any chest pain, dyspnea, or change in exercise tolerance.  He built his new home between June and November.  He plans on getting back to exercising more regularly.    He was recently eval by primary care and noted to be hypertensive.  He was surprised by this finding.  He was placed on lisinopril 1o mg daily and has f/u labs and office f/u scheduled for tomorrow.  In light of elevated bp's, he decided to reestablish care with cardiology.  He has been checking his BP @ home and it has been variable - 150's to 190's.  He denies chest pain, palpitations, dyspnea, pnd, orthopnea, n, v, dizziness, syncope, edema,  weight gain, or early satiety.  He doesn't add salt to food but does eat out frequently.  Home Medications    Prior to Admission medications   Medication Sig Start Date End Date Taking? Authorizing Provider  aspirin 81 MG tablet Take 81 mg by mouth daily.   Yes Historical Provider, MD  niacin 500 MG tablet Take 1,000 mg by mouth daily.    Yes Historical Provider, MD  valACYclovir (VALTREX) 500 MG tablet Take 1 tablet (500 mg total) by mouth daily. 12/14/16  Yes Odella Aquas, NP  lisinopril (PRINIVIL,ZESTRIL) 20 MG tablet Take 1 tablet (20 mg total) by mouth daily. 12/27/16   Rogelia Mire, NP    Review of Systems    He denies chest pain, palpitations, dyspnea, pnd, orthopnea, n, v, dizziness, syncope, edema, weight gain, or early satiety.  All other  systems reviewed and are otherwise negative except as noted above.  Physical Exam    VS:  BP (!) 146/96   Pulse 66   Ht 5' 7.25" (1.708 m)   Wt 167 lb 6.4 oz (75.9 kg)   BMI 26.02 kg/m  , BMI Body mass index is 26.02 kg/m. GEN: Well nourished, well developed, in no acute distress.  HEENT: normal.  Neck: Supple, no JVD, carotid bruits, or masses. Cardiac: RRR, 2/6 mid-syst murmur @ apex, no rubs, or gallops. No clubbing, cyanosis, edema.  Radials/DP/PT 2+ and equal bilaterally.  Respiratory:  Respirations regular and unlabored, clear to auscultation bilaterally. GI: Soft, nontender, nondistended, BS + x 4. MS: no deformity or atrophy. Skin: warm and dry, no rash. Neuro:  Strength and sensation are intact. Psych: Normal affect.  Accessory Clinical Findings    ECG - RSR, 66, PVC, IVCD.  Assessment & Plan    1.  Hypertensive Heart Disease:  Pt recently f/u with primary care (re-established care) and was noted to be hypertensive.  He was placed on lisinopril 10 daily and blood pressures @ home have been ranging 150's to 190's.  He is asymptomatic.  BP today is 146/96.  I will increase his lisinopril to 20 mg daily and suspect that this will need further titration and likely addition of another agent. In the past, he was on lisinopril, amlodipine, and metoprolol.  He says that he was slowly but surely weaned off of all meds 2/2 low blood pressures @ cardiac rehab, thus slow titration of bp meds is prudent at this time. He is scheduled to have a bmet w/ primary care tomorrow.  I rec that he follow his bp's @ home and contact us if pressures are consistently > [id number] systolic.  Following titration of lisinopril to a max of 40 daily, I would add amlodipine - probably start with 2.5 daily and titrate from there if necessary.  He prefers to have bp managed @ PCP office as it is closer to his home.  2.  CAD: s/p prior LAD, D1, and LCX PCI with STEMI in 2013. He has done well since then w/o chest pain  or dyspnea.  He remains on asa.  He is intolerant to statins and zetia.  He may be a candidate for the ORION trial and he indicated that he would like to speak with one of our research nurses.  I will forward his information to our research team.  3.  HL:  As above, intolerant to statins and zetia.  Having fasting lipids w/ primary care tomorrow.  Will refer to our research team re: ORION trial.  4. Hypertrophic CM:  euvolemic on exam.  BP mgmt as above.  5.  Dispo:  Titrate lisinopril today. Scheduled for bmet tomorrow.  I will arrange for f/u with Dr. Martinique in 4-6 mos, since he plans to have BP managed @ PCP.  If he changes his mind, we can have him seen in BP mgmt clinic with one of our pharmacists sooner.   Murray Hodgkins, NP 12/27/2016, 1:18 PM

## 2016-12-28 ENCOUNTER — Encounter: Payer: Self-pay | Admitting: Adult Health

## 2016-12-28 ENCOUNTER — Other Ambulatory Visit: Payer: Self-pay | Admitting: Adult Health

## 2016-12-28 ENCOUNTER — Ambulatory Visit (INDEPENDENT_AMBULATORY_CARE_PROVIDER_SITE_OTHER): Payer: Medicare HMO | Admitting: Adult Health

## 2016-12-28 VITALS — BP 162/94 | HR 57 | Ht 67.25 in | Wt 168.2 lb

## 2016-12-28 DIAGNOSIS — I10 Essential (primary) hypertension: Secondary | ICD-10-CM

## 2016-12-28 DIAGNOSIS — M542 Cervicalgia: Secondary | ICD-10-CM | POA: Diagnosis not present

## 2016-12-28 DIAGNOSIS — Z0000 Encounter for general adult medical examination without abnormal findings: Secondary | ICD-10-CM

## 2016-12-28 NOTE — Progress Notes (Signed)
Subjective:    Patient ID: Brandon Gonzales, male    DOB: 03/25/1951, 66 y.o.   MRN: [id number]  HPI:  Brandon Gonzales presents for f/u for HTN, lab draw, and cervical neck/left shoulder pain.  He recently visited his Cardiologist and his Lisinopril was increased tfrom 10mg  to 20mg .  Due to repetitive heavy lifting in Dec 2017 he developed cervical neck and left shoulder pain that has slowly improving with rest,twice weekly Chiropractic care and heat/ice.     Patient Care Team    Relationship Specialty Notifications Start End  Odella Aquas, NP PCP - General Family Medicine  12/14/16   Jarome Matin, MD Consulting Physician Dermatology  12/14/16   Melida Quitter, MD Consulting Physician Otolaryngology  12/14/16     Patient Active Problem List   Diagnosis Date Noted  . Neck pain 12/28/2016  . NSVT (nonsustained ventricular tachycardia) ([place]) 09/25/2012  . Hypertrophic obstructive cardiomyopathy(425.11) 09/25/2012  . CKD (chronic kidney disease) stage 3, GFR 30-59 ml/min 09/25/2012  . ST elevation myocardial infarction (STEMI) of lateral wall ([place]) 09/22/2012  . CAD (coronary artery disease) 01/11/2012  . HTN (hypertension) 01/11/2012  . Dyslipidemia   . H/O gastroesophageal reflux (GERD)   . Arthritis      Past Medical History:  Diagnosis Date  . Arthritis   . Cancer (HCC)    SKIN CA  SQUAMOUS CELL  . Coronary artery disease    a. 2007 Stent to LAD, PTCA D1;  b. DES to LAD & LCx 10/13 in the setting of STEMI.  [name] Kitchen Dyslipidemia    a. Statin and zetia-intolerant. On niacin.  [name] Kitchen GERD (gastroesophageal reflux disease)   . Heart murmur   . Hypertension   . Hypertrophic cardiomyopathy ([place])    a. 09/2012 Echo: EF 55%, mid to apical anterolateral, posterior, apical anterior, apical HK, Gr1 DD, turbulence across LVOT suggesting degree of obstruction, mild Brandon, mildly dil LA.     Past Surgical History:  Procedure Laterality Date  . CARDIAC CATHETERIZATION  2007   stent to LAD and PTCA of diagonal  branch  . CARDIAC CATHETERIZATION  2010  . CORONARY ANGIOPLASTY WITH STENT PLACEMENT  2013   95-99% prox LAD, patent LAD stent distal to this, occluded mid-dist LCx, mild <10% RCA irreg; s/p DES-prox LAD & DES to mid-dist LCx  . [place]   right  . [place]   left  . LEFT HEART CATH Bilateral 09/22/2012   Procedure: LEFT HEART CATH;  Surgeon: Peter M Martinique, MD;  Location[facility] CATH LAB;  Service: Cardiovascular;  Laterality: Bilateral;  . PERCUTANEOUS CORONARY STENT INTERVENTION (PCI-S) Right 09/22/2012   Procedure: PERCUTANEOUS CORONARY STENT INTERVENTION (PCI-S);  Surgeon: Peter M Martinique, MD;  Location: [facility] CATH LAB;  Service: Cardiovascular;  Laterality: Right;     Family History  Problem Relation Age of Onset  . Heart attack Mother 15  . Heart disease Mother      History  Drug Use No     History  Alcohol Use No     History  Smoking Status  . Never Smoker  Smokeless Tobacco  . Never Used     Outpatient Encounter Prescriptions as of 12/28/2016  Medication Sig  . aspirin 81 MG tablet Take 81 mg by mouth daily.  [name] Kitchen lisinopril (PRINIVIL,ZESTRIL) 20 MG tablet Take 1 tablet (20 mg total) by mouth daily.  . niacin 500 MG tablet Take 1,000 mg by mouth daily.   . valACYclovir (VALTREX)  500 MG tablet Take 1 tablet (500 mg total) by mouth daily.   No facility-administered encounter medications on file as of 12/28/2016.     Allergies: Antihistamines, chlorpheniramine-type; Pheniramine; Statins; and Zetia [ezetimibe]  Body mass index is 26.15 kg/m.  Blood pressure (!) 162/94, pulse (!) 57, height 5' 7.25" (1.708 m), weight 168 lb 3.2 oz (76.3 kg).     Review of Systems  Constitutional: Negative for activity change, appetite change, fatigue and unexpected weight change.  Eyes: Negative for visual disturbance.  Respiratory: Negative for cough, choking, chest tightness and shortness of breath.   Cardiovascular: Negative for chest pain, palpitations  and leg swelling.  Genitourinary: Negative for difficulty urinating.  Musculoskeletal: Positive for arthralgias, myalgias, neck pain and neck stiffness.  Skin: Negative for color change, pallor, rash and wound.  Neurological: Negative for dizziness, tremors, speech difficulty and weakness.       Objective:   Physical Exam  Constitutional: He is oriented to person, place, and time. He appears well-developed and well-nourished. No distress.  HENT:  Head: Normocephalic and atraumatic.  Right Ear: External ear normal.  Left Ear: External ear normal.  Eyes: Pupils are equal, round, and reactive to light.  Neck: Neck supple. No tracheal deviation present. No thyromegaly present.  Cardiovascular: Normal rate, regular rhythm and intact distal pulses.  Exam reveals no gallop and no friction rub.   Murmur heard. 2/6 mid systolic murmur  Musculoskeletal: Normal range of motion. He exhibits tenderness.  Tenderness over cervical neck and left shoulder.  ROM normal.  Strong, equal grip strengths.  Lymphadenopathy:    He has no cervical adenopathy.  Neurological: He is alert and oriented to person, place, and time. He has normal reflexes.  Skin: Skin is warm and dry. He is not diaphoretic.  Psychiatric: He has a normal mood and affect. His behavior is normal. Judgment and thought content normal.  Nursing note and vitals reviewed.         Assessment & Plan:   1. Routine physical examination   2. Essential hypertension   3. Neck pain     HTN (hypertension) Start Lisinopril 20 mg.  Weekly nurse visits for BP checks.      FOLLOW-UP:  Please schedule weekly nurse visits for BP checks.   Continue with Chiropractor as directed and use heat and/or ice for neck/left shoulder pain. If pain does not improve in 4 weeks, then please return for possible MRI referral. Next regular f/u in 6 months. All questions/concerns answered.  Katy d. Arvil Persons, NP-C

## 2016-12-28 NOTE — Assessment & Plan Note (Signed)
Start Lisinopril 20 mg.  Weekly nurse visits for BP checks.

## 2016-12-28 NOTE — Patient Instructions (Addendum)
Hypertension Hypertension is another name for high blood pressure. High blood pressure forces your heart to work harder to pump blood. A blood pressure reading has two numbers, which includes a higher number over a lower number (example: 110/72). Follow these instructions at home:  Have your blood pressure rechecked by your doctor.  Only take medicine as told by your doctor. Follow the directions carefully. The medicine does not work as well if you skip doses. Skipping doses also puts you at risk for problems.  Do not smoke.  Monitor your blood pressure at home as told by your doctor. Contact a doctor if:  You think you are having a reaction to the medicine you are taking.  You have repeat headaches or feel dizzy.  You have puffiness (swelling) in your ankles.  You have trouble with your vision. Get help right away if:  You get a very bad headache and are confused.  You feel weak, numb, or faint.  You get chest or belly (abdominal) pain.  You throw up (vomit).  You cannot breathe very well. This information is not intended to replace advice given to you by your health care provider. Make sure you discuss any questions you have with your health care provider. Document Released: 05/08/2008 Document Revised: 04/27/2016 Document Reviewed: 09/12/2013 Elsevier Interactive Patient Education  2017 Elsevier Inc.   Cervical Radiculopathy Introduction Cervical radiculopathy means that a nerve in the neck is pinched or bruised. This can cause pain or loss of feeling (numbness) that runs from your neck to your arm and fingers. Follow these instructions at home: Managing pain  Take over-the-counter and prescription medicines only as told by your doctor.  If directed, put ice on the injured or painful area.  Put ice in a plastic bag.  Place a towel between your skin and the bag.  Leave the ice on for 20 minutes, 2-3 times per day.  If ice does not help, you can try using heat.  Take a warm shower or warm bath, or use a heat pack as told by your doctor.  You may try a gentle neck and shoulder massage. Activity  Rest as needed. Follow instructions from your doctor about any activities to avoid.  Do exercises as told by your doctor or physical therapist. General instructions  If you were given a soft collar, wear it as told by your doctor.  Use a flat pillow when you sleep.  Keep all follow-up visits as told by your doctor. This is important. Contact a doctor if:  Your condition does not improve with treatment. Get help right away if:  Your pain gets worse and is not controlled with medicine.  You lose feeling or feel weak in your hand, arm, face, or leg.  You have a fever.  You have a stiff neck.  You cannot control when you poop or pee (have incontinence).  You have trouble with walking, balance, or talking. This information is not intended to replace advice given to you by your health care provider. Make sure you discuss any questions you have with your health care provider. Document Released: 11/09/2011 Document Revised: 04/27/2016 Document Reviewed: 01/14/2015  2017 Elsevier   Please schedule weekly nurse visits for BP checks.   Continue with Chiropractor as directed and use heat and/or ice for neck/left shoulder pain. If pain does not improve in 4 weeks, then please return for possible MRI referral. Next regular f/u in 6 months.

## 2016-12-29 LAB — HEMOGLOBIN A1C
Est. average glucose Bld gHb Est-mCnc: 97 mg/dL
HEMOGLOBIN A1C: 5 % (ref 4.8–5.6)

## 2016-12-29 LAB — COMPREHENSIVE METABOLIC PANEL
ALT: 20 IU/L (ref 0–44)
AST: 20 IU/L (ref 0–40)
Albumin/Globulin Ratio: 2.6 — ABNORMAL HIGH (ref 1.2–2.2)
Albumin: 4.6 g/dL (ref 3.6–4.8)
Alkaline Phosphatase: 48 IU/L (ref 39–117)
BUN/Creatinine Ratio: 16 (ref 10–24)
BUN: 19 mg/dL (ref 8–27)
Bilirubin Total: 1.6 mg/dL — ABNORMAL HIGH (ref 0.0–1.2)
CALCIUM: 9.4 mg/dL (ref 8.6–10.2)
CO2: 24 mmol/L (ref 18–29)
CREATININE: 1.2 mg/dL (ref 0.76–1.27)
Chloride: 105 mmol/L (ref 96–106)
GFR, EST AFRICAN AMERICAN: 72 mL/min/{1.73_m2} (ref >59)
GFR, EST NON AFRICAN AMERICAN: 62 mL/min/{1.73_m2} (ref >59)
GLUCOSE: 100 mg/dL — AB (ref 65–99)
Globulin, Total: 1.8 g/dL (ref 1.5–4.5)
Potassium: 4.4 mmol/L (ref 3.5–5.2)
Sodium: 144 mmol/L (ref 134–144)
TOTAL PROTEIN: 6.4 g/dL (ref 6.0–8.5)

## 2016-12-29 LAB — LIPID PANEL
CHOL/HDL RATIO: 5.6 ratio — AB (ref 0.0–5.0)
CHOLESTEROL TOTAL: 202 mg/dL — AB (ref 100–199)
HDL: 36 mg/dL — ABNORMAL LOW (ref >39)
LDL CALC: 146 mg/dL — AB (ref 0–99)
Triglycerides: 99 mg/dL (ref 0–149)
VLDL Cholesterol Cal: 20 mg/dL (ref 5–40)

## 2016-12-29 LAB — HEPATITIS C ANTIBODY

## 2016-12-29 LAB — VITAMIN D 25 HYDROXY (VIT D DEFICIENCY, FRACTURES): Vit D, 25-Hydroxy: 33.6 ng/mL (ref 30.0–100.0)

## 2016-12-31 LAB — CBC WITH DIFFERENTIAL/PLATELET
BASOS ABS: 0 10*3/uL (ref 0.0–0.2)
Basos: 0 %
EOS (ABSOLUTE): 0.1 10*3/uL (ref 0.0–0.4)
Eos: 2 %
Hematocrit: 49.1 % (ref 37.5–51.0)
Hemoglobin: 16.8 g/dL (ref 13.0–17.7)
IMMATURE GRANULOCYTES: 0 %
Immature Grans (Abs): 0 10*3/uL (ref 0.0–0.1)
Lymphocytes Absolute: 1.1 10*3/uL (ref 0.7–3.1)
Lymphs: 20 %
MCH: 30.8 pg (ref 26.6–33.0)
MCHC: 34.2 g/dL (ref 31.5–35.7)
MCV: 90 fL (ref 79–97)
MONOS ABS: 0.4 10*3/uL (ref 0.1–0.9)
Monocytes: 8 %
NEUTROS PCT: 70 %
Neutrophils Absolute: 3.8 10*3/uL (ref 1.4–7.0)
PLATELETS: 161 10*3/uL (ref 150–379)
RBC: 5.45 x10E6/uL (ref 4.14–5.80)
RDW: 13.7 % (ref 12.3–15.4)
WBC: 5.4 10*3/uL (ref 3.4–10.8)

## 2016-12-31 LAB — PLEASE NOTE

## 2017-01-01 ENCOUNTER — Other Ambulatory Visit: Payer: Self-pay

## 2017-01-04 ENCOUNTER — Ambulatory Visit (INDEPENDENT_AMBULATORY_CARE_PROVIDER_SITE_OTHER): Payer: Medicare HMO

## 2017-01-04 VITALS — BP 134/86

## 2017-01-04 DIAGNOSIS — I10 Essential (primary) hypertension: Secondary | ICD-10-CM | POA: Diagnosis not present

## 2017-01-04 NOTE — Progress Notes (Signed)
Pt here for nurse BP check only.  Pt denies chest pains, SOB, dizziness, arm pain or jaw pain.  Pt brought home BP readings ranging from [id number] systolically and [id number] diastolically.  BP in office today was 134/86.  Pt questions accuracy of his home BP monitor.  Advised pt to bring BP monitor to next BP check for comparison.  Pt expressed understanding and is agreeable.  Pt has nurse BP check in 1 week.  Charyl Bigger, CMA

## 2017-01-11 ENCOUNTER — Other Ambulatory Visit: Payer: Medicare HMO

## 2017-01-11 ENCOUNTER — Ambulatory Visit (INDEPENDENT_AMBULATORY_CARE_PROVIDER_SITE_OTHER): Payer: Medicare HMO

## 2017-01-11 NOTE — Progress Notes (Signed)
Pt here for nurse BP check only and labs.  Pt brings in log of home readings showing that diastolic readings have been 123456 and systolic readings of [id number].  Pt brought his home meter for comparison today which produced a reading of 154/92 compared to in office reading of 145/83.  Advised pt that he needs to purchase a new machine since there is a significant difference in readings.  Continue to monitor readings at home and return in 1 week with home BP log.  Pt denies chest pains, SOB, dizziness, arm pain, jaw pain.  Pt expressed understanding and is agreeable.  Charyl Bigger, CMA

## 2017-01-12 ENCOUNTER — Other Ambulatory Visit: Payer: Self-pay

## 2017-01-12 DIAGNOSIS — R748 Abnormal levels of other serum enzymes: Secondary | ICD-10-CM

## 2017-01-12 LAB — COMPREHENSIVE METABOLIC PANEL
ALBUMIN: 4.3 g/dL (ref 3.6–4.8)
ALT: 20 IU/L (ref 0–44)
AST: 21 IU/L (ref 0–40)
Albumin/Globulin Ratio: 2.5 — ABNORMAL HIGH (ref 1.2–2.2)
Alkaline Phosphatase: 48 IU/L (ref 39–117)
BUN / CREAT RATIO: 16 (ref 10–24)
BUN: 20 mg/dL (ref 8–27)
Bilirubin Total: 1.8 mg/dL — ABNORMAL HIGH (ref 0.0–1.2)
CALCIUM: 9.6 mg/dL (ref 8.6–10.2)
CO2: 23 mmol/L (ref 18–29)
CREATININE: 1.28 mg/dL — AB (ref 0.76–1.27)
Chloride: 105 mmol/L (ref 96–106)
GFR calc Af Amer: 67 mL/min/{1.73_m2} (ref >59)
GFR, EST NON AFRICAN AMERICAN: 58 mL/min/{1.73_m2} — AB (ref >59)
GLOBULIN, TOTAL: 1.7 g/dL (ref 1.5–4.5)
Glucose: 99 mg/dL (ref 65–99)
Potassium: 4.3 mmol/L (ref 3.5–5.2)
SODIUM: 142 mmol/L (ref 134–144)
Total Protein: 6 g/dL (ref 6.0–8.5)

## 2017-01-18 ENCOUNTER — Ambulatory Visit (INDEPENDENT_AMBULATORY_CARE_PROVIDER_SITE_OTHER): Payer: Medicare HMO | Admitting: Adult Health

## 2017-01-18 VITALS — BP 156/82 | HR 72

## 2017-01-18 DIAGNOSIS — I10 Essential (primary) hypertension: Secondary | ICD-10-CM | POA: Diagnosis not present

## 2017-01-18 NOTE — Progress Notes (Signed)
Pt here for nurse BP check.  Pt also brought his new machine for comparison.  BP in office with our monitor showed 156/82, while pt's machine showed 127/87 in the same arm.  Pt also checked his blood pressure in left arm which revealed a reading of 161/100.  Pt also brings in BP log revealing readings of 127-159/88-99 since last nurse visit.  Advised pt that he needs OV to discuss with Lillard Anes, NP-C to evaluate need for medication changes.  Pt refused, stating that he had "wasted his time coming for blood pressure checks" with our office.  Pt did not schedule OV and stated that he would call his cardiologist.  Charyl Bigger, CMA

## 2017-01-25 ENCOUNTER — Ambulatory Visit: Payer: Medicare HMO

## 2017-02-01 ENCOUNTER — Ambulatory Visit (INDEPENDENT_AMBULATORY_CARE_PROVIDER_SITE_OTHER): Payer: Medicare HMO | Admitting: Adult Health

## 2017-02-01 VITALS — BP 120/76 | HR 76

## 2017-02-01 DIAGNOSIS — I10 Essential (primary) hypertension: Secondary | ICD-10-CM | POA: Diagnosis not present

## 2017-02-01 NOTE — Progress Notes (Signed)
Pt here for blood pressure check only.  Blood pressure WNL today.  Pt denies CP, SOB, palpitations, headaches and dizziness.  Pt has appointment 02/08/17 for OV and labs.  Advised pt to keep OV.  Charyl Bigger, CMA

## 2017-02-19 ENCOUNTER — Emergency Department (HOSPITAL_COMMUNITY)
Admission: EM | Admit: 2017-02-19 | Discharge: 2017-02-20 | Disposition: A | Discharge status: Home / Self Care | Payer: Medicare HMO | Attending: Emergency Medicine | Admitting: Emergency Medicine

## 2017-02-19 ENCOUNTER — Encounter (HOSPITAL_COMMUNITY): Payer: Self-pay | Admitting: *Deleted

## 2017-02-19 ENCOUNTER — Emergency Department (HOSPITAL_COMMUNITY): Payer: Medicare HMO

## 2017-02-19 ENCOUNTER — Telehealth: Payer: Self-pay | Admitting: Cardiology

## 2017-02-19 DIAGNOSIS — Z79899 Other long term (current) drug therapy: Secondary | ICD-10-CM | POA: Diagnosis not present

## 2017-02-19 DIAGNOSIS — Z9861 Coronary angioplasty status: Secondary | ICD-10-CM | POA: Diagnosis not present

## 2017-02-19 DIAGNOSIS — R071 Chest pain on breathing: Secondary | ICD-10-CM | POA: Insufficient documentation

## 2017-02-19 DIAGNOSIS — Z7982 Long term (current) use of aspirin: Secondary | ICD-10-CM | POA: Diagnosis not present

## 2017-02-19 DIAGNOSIS — N183 Chronic kidney disease, stage 3 (moderate): Secondary | ICD-10-CM | POA: Insufficient documentation

## 2017-02-19 DIAGNOSIS — R079 Chest pain, unspecified: Secondary | ICD-10-CM | POA: Diagnosis not present

## 2017-02-19 DIAGNOSIS — Z85828 Personal history of other malignant neoplasm of skin: Secondary | ICD-10-CM | POA: Insufficient documentation

## 2017-02-19 DIAGNOSIS — R404 Transient alteration of awareness: Secondary | ICD-10-CM | POA: Diagnosis not present

## 2017-02-19 DIAGNOSIS — I129 Hypertensive chronic kidney disease with stage 1 through stage 4 chronic kidney disease, or unspecified chronic kidney disease: Secondary | ICD-10-CM | POA: Insufficient documentation

## 2017-02-19 DIAGNOSIS — R531 Weakness: Secondary | ICD-10-CM | POA: Diagnosis not present

## 2017-02-19 DIAGNOSIS — I10 Essential (primary) hypertension: Secondary | ICD-10-CM | POA: Diagnosis not present

## 2017-02-19 DIAGNOSIS — I2510 Atherosclerotic heart disease of native coronary artery without angina pectoris: Secondary | ICD-10-CM | POA: Insufficient documentation

## 2017-02-19 DIAGNOSIS — Z955 Presence of coronary angioplasty implant and graft: Secondary | ICD-10-CM | POA: Insufficient documentation

## 2017-02-19 DIAGNOSIS — R0602 Shortness of breath: Secondary | ICD-10-CM | POA: Diagnosis not present

## 2017-02-19 LAB — URINALYSIS, ROUTINE W REFLEX MICROSCOPIC
BACTERIA UA: NONE SEEN
BILIRUBIN URINE: NEGATIVE
Glucose, UA: NEGATIVE mg/dL
KETONES UR: NEGATIVE mg/dL
LEUKOCYTES UA: NEGATIVE
Nitrite: NEGATIVE
PH: 7 (ref 5.0–8.0)
Protein, ur: NEGATIVE mg/dL
SQUAMOUS EPITHELIAL / LPF: NONE SEEN
Specific Gravity, Urine: 1.003 — ABNORMAL LOW (ref 1.005–1.030)

## 2017-02-19 LAB — I-STAT CHEM 8, ED
BUN: 25 mg/dL — ABNORMAL HIGH (ref 6–20)
CALCIUM ION: 1.32 mmol/L (ref 1.15–1.40)
Chloride: 103 mmol/L (ref 101–111)
Creatinine, Ser: 1.2 mg/dL (ref 0.61–1.24)
GLUCOSE: 92 mg/dL (ref 65–99)
HCT: 42 % (ref 39.0–52.0)
HEMOGLOBIN: 14.3 g/dL (ref 13.0–17.0)
POTASSIUM: 4.3 mmol/L (ref 3.5–5.1)
Sodium: 141 mmol/L (ref 135–145)
TCO2: 32 mmol/L (ref 0–100)

## 2017-02-19 LAB — BRAIN NATRIURETIC PEPTIDE: B NATRIURETIC PEPTIDE 5: 50 pg/mL (ref 0.0–100.0)

## 2017-02-19 LAB — I-STAT TROPONIN, ED: TROPONIN I, POC: 0 ng/mL (ref 0.00–0.08)

## 2017-02-19 LAB — TROPONIN I: Troponin I: <0.03 ng/mL (ref <0.03)

## 2017-02-19 MED ORDER — ALBUTEROL (5 MG/ML) CONTINUOUS INHALATION SOLN: 10.0000 mg/h | INHALATION_SOLUTION | RESPIRATORY_TRACT | Status: DC

## 2017-02-19 MED ORDER — AMLODIPINE BESYLATE 5 MG PO TABS
5.0000 mg | ORAL_TABLET | Freq: Every day | ORAL | Status: DC
  Administered 2017-02-19: 5 mg via ORAL
  Filled 2017-02-19: qty 1

## 2017-02-19 MED ORDER — ACETAMINOPHEN 500 MG PO TABS
1000.0000 mg | ORAL_TABLET | Freq: Once | ORAL | Status: AC
  Administered 2017-02-19: 1000 mg via ORAL
  Filled 2017-02-19: qty 2

## 2017-02-19 NOTE — ED Notes (Signed)
Card MD at bedside.

## 2017-02-19 NOTE — ED Provider Notes (Signed)
[place] DEPT Provider Note   CSN: [id number] Arrival date & time: 02/19/17  1815     History   Chief Complaint Chief Complaint  Patient presents with  . Weakness  . Shortness of Breath    with exertion    HPI Brandon Gonzales is a 66 y.o. male PMH CAD s/p stent, HTN presents for worsening dyspnea and concern for ACS. Pt reports worsening dyspnea since January, usually exertional but today was the first time it occurred at rest. Pt states anti-hypertensives were restarted in January. He had not needed them since 2014. Denies CP, N/V, diaphoresis, fever, cough. Noted to have BP 210/107 with EMS.   The history is provided by the patient and medical records.  Shortness of Breath  This is a chronic problem. The average episode lasts 3 months. The problem occurs intermittently.The current episode started 1 to 2 hours ago. The problem has been gradually worsening. Associated symptoms include headaches. Pertinent negatives include no fever, no rhinorrhea, no neck pain, no cough, no sputum production, no hemoptysis, no wheezing, no chest pain, no syncope, no vomiting, no abdominal pain, no leg pain, no leg swelling and no claudication. He has tried nothing for the symptoms. Associated medical issues include CAD and past MI. Associated medical issues do not include asthma, COPD, pneumonia, chronic lung disease, PE, heart failure, DVT or recent surgery.    Past Medical History:  Diagnosis Date  . Arthritis   . Cancer (HCC)    SKIN CA  SQUAMOUS CELL  . Coronary artery disease    a. 2007 Stent to LAD, PTCA D1;  b. DES to LAD & LCx 10/13 in the setting of STEMI.  [name] Kitchen Dyslipidemia    a. Statin and zetia-intolerant. On niacin.  [name] Kitchen GERD (gastroesophageal reflux disease)   . Heart murmur   . Hypertension   . Hypertrophic cardiomyopathy ([place])    a. 09/2012 Echo: EF 55%, mid to apical anterolateral, posterior, apical anterior, apical HK, Gr1 DD, turbulence across LVOT suggesting degree of  obstruction, mild MR, mildly dil LA.    Patient Active Problem List   Diagnosis Date Noted  . Neck pain 12/28/2016  . NSVT (nonsustained ventricular tachycardia) ([place]) 09/25/2012  . Hypertrophic obstructive cardiomyopathy(425.11) 09/25/2012  . CKD (chronic kidney disease) stage 3, GFR 30-59 ml/min 09/25/2012  . ST elevation myocardial infarction (STEMI) of lateral wall ([place]) 09/22/2012  . CAD (coronary artery disease) 01/11/2012  . HTN (hypertension) 01/11/2012  . Dyslipidemia   . H/O gastroesophageal reflux (GERD)   . Arthritis     Past Surgical History:  Procedure Laterality Date  . CARDIAC CATHETERIZATION  2007   stent to LAD and PTCA of diagonal branch  . CARDIAC CATHETERIZATION  2010  . CORONARY ANGIOPLASTY WITH STENT PLACEMENT  2013   95-99% prox LAD, patent LAD stent distal to this, occluded mid-dist LCx, mild <10% RCA irreg; s/p DES-prox LAD & DES to mid-dist LCx  . [place]   right  . [place]   left  . LEFT HEART CATH Bilateral 09/22/2012   Procedure: LEFT HEART CATH;  Surgeon: Peter M Martinique, MD;  Location: [facility] CATH LAB;  Service: Cardiovascular;  Laterality: Bilateral;  . PERCUTANEOUS CORONARY STENT INTERVENTION (PCI-S) Right 09/22/2012   Procedure: PERCUTANEOUS CORONARY STENT INTERVENTION (PCI-S);  Surgeon: Peter M Martinique, MD;  Location: [facility] CATH LAB;  Service: Cardiovascular;  Laterality: Right;       Home Medications    Prior to Admission medications  Medication Sig Start Date End Date Taking? Authorizing Provider  aspirin EC 81 MG tablet Take 81 mg by mouth daily.   Yes Historical Provider, MD  aspirin-acetaminophen-caffeine (EXCEDRIN MIGRAINE) [phone] MG tablet Take 2 tablets by mouth daily as needed for headache.   Yes Historical Provider, MD  Inositol Niacinate (NIACIN FLUSH FREE) 500 MG CAPS Take 1,000 mg by mouth daily.   Yes Historical Provider, MD  lisinopril (PRINIVIL,ZESTRIL) 20 MG tablet Take 1 tablet (20 mg total) by mouth  daily. 12/27/16  Yes Rogelia Mire, NP  valACYclovir (VALTREX) 500 MG tablet Take 1 tablet (500 mg total) by mouth daily. Patient taking differently: Take 500-1,000 mg by mouth See admin instructions. Take as needed for fever blisters -  Take 2 tablets (1000 mg) by mouth twice daily for 3-4 days, then take 1 tablet (500 mg) twice daily for 7 days, then stop. 12/14/16  Yes Odella Aquas, NP    Family History Family History  Problem Relation Age of Onset  . Heart attack Mother 27  . Heart disease Mother     Social History Social History  Substance Use Topics  . Smoking status: Never Smoker  . Smokeless tobacco: Never Used  . Alcohol use No     Allergies   Antihistamines, chlorpheniramine-type; Pheniramine; Statins; and Zetia [ezetimibe]   Review of Systems Review of Systems  Constitutional: Negative for fever.  HENT: Negative for rhinorrhea.   Respiratory: Positive for shortness of breath. Negative for cough, hemoptysis, sputum production and wheezing.   Cardiovascular: Negative for chest pain, claudication, leg swelling and syncope.  Gastrointestinal: Negative for abdominal pain, diarrhea, nausea and vomiting.  Musculoskeletal: Negative for back pain and neck pain.  Neurological: Positive for headaches. Negative for syncope, facial asymmetry, speech difficulty and light-headedness.  All other systems reviewed and are negative.    Physical Exam Updated Vital Signs BP (!) 159/87   Pulse (!) 56   Temp 98.7 F (37.1 C)   Resp 14   SpO2 95%   Physical Exam  Constitutional: He is oriented to person, place, and time. He appears well-developed and well-nourished.  HENT:  Head: Normocephalic and atraumatic.  Eyes: Conjunctivae and EOM are normal.  Neck: Normal range of motion. Neck supple.  Cardiovascular: Normal rate and regular rhythm.   No murmur heard. Pulmonary/Chest: Effort normal and breath sounds normal. No respiratory distress.  Abdominal: Soft. There is no  tenderness.  Musculoskeletal: Normal range of motion. He exhibits no edema.  Neurological: He is alert and oriented to person, place, and time.  Skin: Skin is warm and dry. Capillary refill takes less than 2 seconds.  Psychiatric: He has a normal mood and affect.  Nursing note and vitals reviewed.    ED Treatments / Results  Labs (all labs ordered are listed, but only abnormal results are displayed) Labs Reviewed  URINALYSIS, ROUTINE W REFLEX MICROSCOPIC - Abnormal; Notable for the following:       Result Value   Color, Urine COLORLESS (*)    Specific Gravity, Urine 1.003 (*)    Hgb urine dipstick SMALL (*)    All other components within normal limits  I-STAT CHEM 8, ED - Abnormal; Notable for the following:    BUN 25 (*)    All other components within normal limits  BRAIN NATRIURETIC PEPTIDE  TROPONIN I  Randolm Idol, ED    EKG  EKG Interpretation  Date/Time:  Monday February 19 2017 18:46:07 EDT Ventricular Rate:  69 PR Interval:  QRS Duration: 145 QT Interval:  427 QTC Calculation: 458 R Axis:   -21 Text Interpretation:  Sinus rhythm Right bundle branch block deep t wave inversion resovled Confirmed by FLOYD MD, DANIEL [phone]) on 02/19/2017 9:13:52 PM       Radiology Dg Chest 2 View  Result Date: 02/19/2017 CLINICAL DATA:  Shortness of breath EXAM: CHEST  2 VIEW COMPARISON:  09/22/2012 FINDINGS: No focal pulmonary infiltrate, consolidation or effusion. Cardiomediastinal silhouette within normal limits. No pneumothorax. IMPRESSION: No active cardiopulmonary disease. Electronically Signed   By: Donavan Foil M.D.   On: 02/19/2017 20:54    Procedures Procedures (including critical care time)  Medications Ordered in ED Medications  amLODipine (NORVASC) tablet 5 mg (5 mg Oral Given 02/19/17 2232)  acetaminophen (TYLENOL) tablet 1,000 mg (1,000 mg Oral Given 02/19/17 2121)     Initial Impression / Assessment and Plan / ED Course  I have reviewed the triage  vital signs and the nursing notes.  Pertinent labs & imaging results that were available during my care of the patient were reviewed by me and considered in my medical decision making (see chart for details).    66 y.o. male PMH CAD presents for dyspnea at rest. VSS, NAD. Physical exam reassuring. Dyspnea possible angina equivalent in setting of CAD and prior MI. EKG NSR. CXR unremarkable. Initial troponin negative. Consult to cardiology for possible stress vs LHC. Evaluated by cardiology in ED. Delta troponin unremarkable. Pt elected to defer admission and stress test. Given Rx amlodipine by cardiology. Plan is to follow-up with his cardiologist on Wednesday. Return precautions given. Pt voiced understanding and agreement with plan.   Discussed with my attending physician, Dr Tyrone Nine   Final Clinical Impressions(s) / ED Diagnoses   Final diagnoses:  Chest pain at rest    New Prescriptions Discharge Medication List as of 02/20/2017 12:00 AM       Monico Blitz, MD 02/20/17 [place], DO 02/20/17 1135

## 2017-02-19 NOTE — Consult Note (Signed)
Referring Physician:   [name] is an 66 y.o. male.                       Chief Complaint: Shortness of breath with exertion and hypertension  HPI: 66 year old male with cardiac cath and stent in LAD and LCX 2 years ago has elevated blood pressure and exertional dyspnea without chest pain. He has appointment to see his Cardiology on Wednesday and wants to differ admission and stress test till that time. His chest x-ray and blood work are unremarkable. His blood pressure has improved with amlodipine use. He is already on lisinopril and his heart rate is low to start B-blocker. EKG showed SR and RBBB and anterolateral T wave inversions seen in 2013 have resolved.  Past Medical History:  Diagnosis Date  . Arthritis   . Cancer (HCC)    SKIN CA  SQUAMOUS CELL  . Coronary artery disease    a. 2007 Stent to LAD, PTCA D1;  b. DES to LAD & LCx 10/13 in the setting of STEMI.  [name] Kitchen Dyslipidemia    a. Statin and zetia-intolerant. On niacin.  [name] Kitchen GERD (gastroesophageal reflux disease)   . Heart murmur   . Hypertension   . Hypertrophic cardiomyopathy ([place])    a. 09/2012 Echo: EF 55%, mid to apical anterolateral, posterior, apical anterior, apical HK, Gr1 DD, turbulence across LVOT suggesting degree of obstruction, mild MR, mildly dil LA.      Past Surgical History:  Procedure Laterality Date  . CARDIAC CATHETERIZATION  2007   stent to LAD and PTCA of diagonal branch  . CARDIAC CATHETERIZATION  2010  . CORONARY ANGIOPLASTY WITH STENT PLACEMENT  2013   95-99% prox LAD, patent LAD stent distal to this, occluded mid-dist LCx, mild <10% RCA irreg; s/p DES-prox LAD & DES to mid-dist LCx  . [place]   right  . [place]   left  . LEFT HEART CATH Bilateral 09/22/2012   Procedure: LEFT HEART CATH;  Surgeon: Peter M Martinique, MD;  Location: [facility] CATH LAB;  Service: Cardiovascular;  Laterality: Bilateral;  . PERCUTANEOUS CORONARY STENT INTERVENTION (PCI-S) Right 09/22/2012   Procedure:  PERCUTANEOUS CORONARY STENT INTERVENTION (PCI-S);  Surgeon: Peter M Martinique, MD;  Location: [facility] CATH LAB;  Service: Cardiovascular;  Laterality: Right;    Family History  Problem Relation Age of Onset  . Heart attack Mother 40  . Heart disease Mother    Social History:  reports that he has never smoked. He has never used smokeless tobacco. He reports that he does not drink alcohol or use drugs.  Allergies:  Allergies  Allergen Reactions  . Antihistamines, Chlorpheniramine-Type Other (See Comments)    Altered mental status  . Pheniramine Other (See Comments)    Altered mental status  . Statins Other (See Comments)    Leg cramps    . Zetia [Ezetimibe] Other (See Comments)    Leg cramps     (Not in a hospital admission)  Results for orders placed or performed during the hospital encounter of 02/19/17 (from the past 48 hour(s))  Brain natriuretic peptide     Status: None   Collection Time: 02/19/17  7:11 PM  Result Value Ref Range   B Natriuretic Peptide 50.0 0.0 - 100.0 pg/mL  I-Stat Troponin, ED (not at [facility])     Status: None   Collection Time: 02/19/17  7:32 PM  Result Value Ref Range   Troponin  i, poc 0.00 0.00 - 0.08 ng/mL   Comment 3            Comment: Due to the release kinetics of cTnI, a negative result within the first hours of the onset of symptoms does not rule out myocardial infarction with certainty. If myocardial infarction is still suspected, repeat the test at appropriate intervals.   I-Stat Chem 8, ED     Status: Abnormal   Collection Time: 02/19/17  7:33 PM  Result Value Ref Range   Sodium 141 135 - 145 mmol/L   Potassium 4.3 3.5 - 5.1 mmol/L   Chloride 103 101 - 111 mmol/L   BUN 25 (H) 6 - 20 mg/dL   Creatinine, Ser 1.20 0.61 - 1.24 mg/dL   Glucose, Bld 92 65 - 99 mg/dL   Calcium, Ion 1.32 1.15 - 1.40 mmol/L   TCO2 32 0 - 100 mmol/L   Hemoglobin 14.3 13.0 - 17.0 g/dL   HCT 42.0 39.0 - 52.0 %  Troponin I     Status: None   Collection Time:  02/19/17 10:25 PM  Result Value Ref Range   Troponin I <0.03 <0.03 ng/mL  Urinalysis, Routine w reflex microscopic     Status: Abnormal   Collection Time: 02/19/17 10:30 PM  Result Value Ref Range   Color, Urine COLORLESS (A) YELLOW   APPearance CLEAR CLEAR   Specific Gravity, Urine 1.003 (L) 1.005 - 1.030   pH 7.0 5.0 - 8.0   Glucose, UA NEGATIVE NEGATIVE mg/dL   Hgb urine dipstick SMALL (A) NEGATIVE   Bilirubin Urine NEGATIVE NEGATIVE   Ketones, ur NEGATIVE NEGATIVE mg/dL   Protein, ur NEGATIVE NEGATIVE mg/dL   Nitrite NEGATIVE NEGATIVE   Leukocytes, UA NEGATIVE NEGATIVE   RBC / HPF 0-5 0 - 5 RBC/hpf   WBC, UA 0-5 0 - 5 WBC/hpf   Bacteria, UA NONE SEEN NONE SEEN   Squamous Epithelial / LPF NONE SEEN NONE SEEN   Dg Chest 2 View  Result Date: 02/19/2017 CLINICAL DATA:  Shortness of breath EXAM: CHEST  2 VIEW COMPARISON:  09/22/2012 FINDINGS: No focal pulmonary infiltrate, consolidation or effusion. Cardiomediastinal silhouette within normal limits. No pneumothorax. IMPRESSION: No active cardiopulmonary disease. Electronically Signed   By: Donavan Foil M.D.   On: 02/19/2017 20:54    Review Of Systems Constitutional: No fever, chills , weight loss or gain. Eyes: No vision change, Wears glasses. No discharge or pain.. Ears: No hearing loss, No tinnitus. Respiratory: No asthma, COPD, pneumonias. Positive shortness of breath. No hemoptysis. Cardiovascular: No chest pain, palpitation or leg edema. Gastrointestinal: No nausea, vomiting or diarrhea or constipation. No GI bleed. No hepatitis. Genitourinary: No dysuria, hematuria or kidney stone. No incontinance. Neurological: Occasional headache, no stroke or seizures.  Psychiatry: No psych facility admission for anxiety, depression or suicide. No detox. Skin: No rash. Musculoskeletal: Positive joint pain. No fibromyalgia. No neck pain or back pain. Lymphadenopathy: No lymphadenopathy Hematology: No anemia or easy bruising.   Blood  pressure (!) 151/92, pulse (!) 52, temperature 98.7 F (37.1 C), resp. rate 11, SpO2 95 %. There is no height or weight on file to calculate BMI. General appearance: alert, cooperative, appears stated age and no distress Head: Normocephalic, atraumatic. Eyes: conjunctivae/corneas clear. PERRL, EOM's intact. Neck: no adenopathy, no carotid bruit, no JVD, supple, symmetrical, trachea midline and thyroid not enlarged. Resp: Clear to auscultation bilaterally Cardio: Regular rate and rhythm, S1, S2 normal, II/VI systolic murmur, no click, rub or gallop GI: soft,  non-tender; bowel sounds normal; no masses,  no organomegaly Extremities: extremities normal, atraumatic, no cyanosis or edema. Skin: Warm and dry. No rashes or lesions Neurologic: Alert and oriented X 3, normal strength and tone. Normal coordination and gait.  Assessment/Plan Shortness of breath on exertion Hypertension CAD S/P MI S/P LAD and LCX stents  Consider nuclear stress teest and echocardiogram on OP basis. Add amlodipine for blood pressure control with lisinopril. See Cardiology as arranged.   Birdie Riddle, MD  02/19/2017, 11:34 PM

## 2017-02-19 NOTE — ED Notes (Signed)
Pt was hypertensive for ems 210/107.  EMS also adds that pt spoke with wife on phone this afternoon (unsure of exact time) and had slurred speech, no slurred speech at this time

## 2017-02-19 NOTE — ED Notes (Signed)
Pt experiencing a headache. Informed Clarise Cruz - RN.

## 2017-02-19 NOTE — Telephone Encounter (Signed)
Spoke to patient.He stated he has been having dizziness and sob with least exertion since first of year.No chest pain.Stated is getting worse.Stated he just don't feel right.He feels like he did when he had M.I.Advised to go to ED.

## 2017-02-19 NOTE — Telephone Encounter (Signed)
Pt c/o Shortness Of Breath: STAT if SOB developed within the last 24 hours or pt is noticeably SOB on the phone  1. Are you currently SOB (can you hear that pt is SOB on the phone)? no  2. How long have you been experiencing SOB? Since the first of the year, "progressively getting worse"  3. Are you SOB when sitting or when up moving around? Moving around.[name] Kitchen"I cannot walk up 2 flight of stairs without stopping to take a break."  4. Are you currently experiencing any other symptoms? Light-headed, nauseous   Patient states that he knows that he has another blockage and states "I need to schedule a time with Dr. Martinique to go to the hospital and see if I have a blockage." Patient states that he had an exam completed for insurance purposes and the results confirmed he had blockage. Thanks.

## 2017-02-19 NOTE — ED Triage Notes (Signed)
Generalized weakness with exertional shortness of breath.  No focal weakness and no neuro deficits.  Pt states that "something doesn't feel right" and feels like when he had an MI in 2013 when he had 100% blockage of his LAD resulting in 2 stents.  No CP with this.  This all began this am.

## 2017-02-19 NOTE — ED Notes (Signed)
Patient transported to X-ray 

## 2017-02-19 NOTE — Telephone Encounter (Signed)
Spoke with pt states that he needs to schedule an appt for dr Martinique because he is sure that he has a blockage and needs another cath done. Pt states that he has been SOB for 6 months and it is getting worse, he states that he cannot go up 2 flights of stairs without stopping to rest he states that this was how it was when he had this done before(STEMI with cath 09-2012). Pt states that his insurance ran 'a test' that told him that he has a blockage again  and needs to have this done. Pt states that he is light-headed and nauseous with this exertional SOB. He denies chest pain, palpitations, orthopnea, or syncope. Informed pt to go to there ER (for eval)and take this paperwork that the insurance with him so they could see. Pt states that he cannot go today but will go tomorrow. Informed pt that he should go today if he thinks that he needs for procedure. Pt verbalizes understanding.

## 2017-02-21 ENCOUNTER — Ambulatory Visit (INDEPENDENT_AMBULATORY_CARE_PROVIDER_SITE_OTHER): Payer: Medicare HMO | Admitting: Cardiology

## 2017-02-21 ENCOUNTER — Encounter: Payer: Self-pay | Admitting: Cardiology

## 2017-02-21 VITALS — BP 132/68 | HR 76 | Ht 68.0 in | Wt 172.0 lb

## 2017-02-21 DIAGNOSIS — R0600 Dyspnea, unspecified: Secondary | ICD-10-CM | POA: Insufficient documentation

## 2017-02-21 DIAGNOSIS — I2510 Atherosclerotic heart disease of native coronary artery without angina pectoris: Secondary | ICD-10-CM

## 2017-02-21 DIAGNOSIS — I422 Other hypertrophic cardiomyopathy: Secondary | ICD-10-CM | POA: Diagnosis not present

## 2017-02-21 DIAGNOSIS — I10 Essential (primary) hypertension: Secondary | ICD-10-CM

## 2017-02-21 DIAGNOSIS — R0609 Other forms of dyspnea: Secondary | ICD-10-CM | POA: Diagnosis not present

## 2017-02-21 DIAGNOSIS — E782 Mixed hyperlipidemia: Secondary | ICD-10-CM

## 2017-02-21 MED ORDER — AMLODIPINE BESYLATE 5 MG PO TABS: 5.0000 mg | ORAL_TABLET | Freq: Every day | ORAL | 3 refills | Status: DC

## 2017-02-21 NOTE — Telephone Encounter (Signed)
Pt went to ED: 66 y.o. male PMH CAD presents for dyspnea at rest. VSS, NAD. Physical exam reassuring. Dyspnea possible angina equivalent in setting of CAD and prior MI. EKG NSR. CXR unremarkable. Initial troponin negative. Consult to cardiology for possible stress vs LHC. Evaluated by cardiology in ED. Delta troponin unremarkable. Pt elected to defer admission and stress test. Given Rx amlodipine by cardiology. Plan is to follow-up with his cardiologist on Wednesday. Return precautions given. Pt voiced understanding and agreement with plan.

## 2017-02-21 NOTE — Progress Notes (Signed)
Office Visit    Patient Name: Brandon Gonzales Date of Encounter: 02/21/2017  Primary Care Provider:  Lillard Anes, NP Primary Cardiologist:  [name], MD   Chief Complaint    66 y/o ? with a h/o CAD, HTN, hypertrophic cm, GERD, and HL, who presents for f/u of  Dyspnea and dizziness.  Past Medical History    Past Medical History:  Diagnosis Date  . Arthritis   . Cancer (HCC)    SKIN CA  SQUAMOUS CELL  . Coronary artery disease    a. 2007 Stent to LAD, PTCA D1;  b. DES to LAD & LCx 10/13 in the setting of STEMI.  [name] Kitchen Dyslipidemia    a. Statin and zetia-intolerant. On niacin.  [name] Kitchen GERD (gastroesophageal reflux disease)   . Heart murmur   . Hypertension   . Hypertrophic cardiomyopathy ([place])    a. 09/2012 Echo: EF 55%, mid to apical anterolateral, posterior, apical anterior, apical HK, Gr1 DD, turbulence across LVOT suggesting degree of obstruction, mild MR, mildly dil LA.   Past Surgical History:  Procedure Laterality Date  . CARDIAC CATHETERIZATION  2007   stent to LAD and PTCA of diagonal branch  . CARDIAC CATHETERIZATION  2010  . CORONARY ANGIOPLASTY WITH STENT PLACEMENT  2013   95-99% prox LAD, patent LAD stent distal to this, occluded mid-dist LCx, mild <10% RCA irreg; s/p DES-prox LAD & DES to mid-dist LCx  . [place]   right  . [place]   left  . LEFT HEART CATH Bilateral 09/22/2012   Procedure: LEFT HEART CATH;  Surgeon: Ulyana Pitones M Martinique, MD;  Location: [facility] CATH LAB;  Service: Cardiovascular;  Laterality: Bilateral;  . PERCUTANEOUS CORONARY STENT INTERVENTION (PCI-S) Right 09/22/2012   Procedure: PERCUTANEOUS CORONARY STENT INTERVENTION (PCI-S);  Surgeon: Patrici Minnis M Martinique, MD;  Location: [facility] CATH LAB;  Service: Cardiovascular;  Laterality: Right;    Allergies  Allergies  Allergen Reactions  . Antihistamines, Chlorpheniramine-Type Other (See Comments)    Altered mental status  . Pheniramine Other (See Comments)    Altered mental status  . Statins  Other (See Comments)    Leg cramps    . Zetia [Ezetimibe] Other (See Comments)    Leg cramps    History of Present Illness    66 y/o ? with the above complex PMH including CAD prior LAD stenting, PTCA to D1 in 2007, followed by lateral STEMI and PCI/DES to the LCX & LAD in 2013.  He also has a h/o HTN, HL, GERD, and hypertrophic cardiomyopathy.  He says that after completing cardiac rehab in 2013, he ended up coming off of bp meds due to low blood pressures. He was last seen in clinic in 2014 @ which time he was doing well.    He was seen in January 2018 and noted to be hypertensive.  He had been checking his BP @ home and it has been variable - 150's to 190's.  His lisinopril was increased to 20 mg daily with further follow up with primary care recommended. He did follow up with primary care on a couple of occasions with discrepancy in his home BP readings and the BP readings in the office.   He called on 3/19 with complaints of dyspnea and dizziness that he states were similar to when he had his MI. He felt very flushed in his face. He was referred to the ED.  States dyspnea was steadily getting worse since January. BP  recorded by EMS was 210/107. Came down to 159/87 in ED.  Ecg showed NSR with a RBBB- new in January 2018. No acute change. Delta troponin was normal. UA, BMET, BNP were all normal. He was discharged on amlodipine 5 mg daily.  He denied chest pain, palpitations,  pnd, orthopnea, n, v,  syncope, edema, weight gain, or early satiety.  He just built a new house last year and felt like he was in good shape.   Home Medications    Prior to Admission medications   Medication Sig Start Date End Date Taking? Authorizing Provider  aspirin 81 MG tablet Take 81 mg by mouth daily.   Yes Historical Provider, MD  niacin 500 MG tablet Take 1,000 mg by mouth daily.    Yes Historical Provider, MD  valACYclovir (VALTREX) 500 MG tablet Take 1 tablet (500 mg total) by mouth daily. 12/14/16  Yes Odella Aquas, NP  lisinopril (PRINIVIL,ZESTRIL) 20 MG tablet Take 1 tablet (20 mg total) by mouth daily. 12/27/16   Rogelia Mire, NP    Review of Systems    As noted in HPI.  All other systems reviewed and are otherwise negative except as noted above.  Physical Exam    VS:  BP 132/68   Pulse 76   Ht 5\' 8"  (1.727 m)   Wt 172 lb (78 kg)   BMI 26.15 kg/m  , BMI Body mass index is 26.15 kg/m. GEN: Well nourished, well developed, in no acute distress.  HEENT: normal.  Neck: Supple, no JVD, carotid bruits, or masses. Cardiac: RRR, 2/6 syst murmur @ apex radiating to RUSB and carotids, no rubs, or gallops. No clubbing, cyanosis, edema.  Radials/DP/PT 2+ and equal bilaterally.  Respiratory:  Respirations regular and unlabored, clear to auscultation bilaterally. GI: Soft, nontender, nondistended, BS + x 4. MS: no deformity or atrophy. Skin: warm and dry, no rash. Neuro:  Strength and sensation are intact. Psych: Normal affect.  Accessory Clinical Findings    Lab Results  Component Value Date   WBC 5.4 12/28/2016   HGB 14.3 02/19/2017   HCT 42.0 02/19/2017   PLT 161 12/28/2016   GLUCOSE 92 02/19/2017   CHOL 202 (H) 12/28/2016   TRIG 99 12/28/2016   HDL 36 (L) 12/28/2016   LDLDIRECT 159.5 10/01/2012   LDLCALC 146 (H) 12/28/2016   ALT 20 01/11/2017   AST 21 01/11/2017   NA 141 02/19/2017   K 4.3 02/19/2017   CL 103 02/19/2017   CREATININE 1.20 02/19/2017   BUN 25 (H) 02/19/2017   CO2 23 01/11/2017   TSH 2.353 09/22/2012   PSA 1.4 09/13/2010   INR 1.08 09/22/2012   HGBA1C 5.0 12/28/2016    ECG 02/19/17- RSR, 69, RBBB.  Assessment & Plan    1.  Hypertensive Heart Disease:  Pt recently  noted to be hypertensive.  He was placed on lisinopril and increased to 20 mg daily. Recent ED visit with marked BP elevation and symptoms. Now improved with addition of amlodipine. Will continue current therapy.       2.  CAD: s/p prior LAD, D1, and LCX PCI with STEMI in 2013. He does  describe some symptoms of dyspnea. This could be related to uncontrolled HTN, HCM, or CAD. Recommend follow up stress Myoview.   He remains on asa.    3.  HL:   intolerant to statins and zetia.  He is not at goal on niacin. Will refer to lipid clinic to evaluate suitability  of a PCSK 9 inhibitor or possible enrollment in clinical trial.  4. Hypertrophic CM:  euvolemic on exam.  BP mgmt as above. Will update Echo.  5.  Dispo:  Lipid clinic referral. Stress Myoview and Echo. Follow up with me in 3 months.   [place] Ndiaye Martinique, MD,FACC 02/21/2017, 9:59 AM

## 2017-02-21 NOTE — Patient Instructions (Signed)
We will refer you to our lipid clinic for treatment of your cholesterol  Continue your current therapy  We will schedule you for a nuclear stress test and Echocardiogram  I will see you in 3 months.

## 2017-02-23 DIAGNOSIS — B356 Tinea cruris: Secondary | ICD-10-CM | POA: Diagnosis not present

## 2017-02-23 DIAGNOSIS — Z85828 Personal history of other malignant neoplasm of skin: Secondary | ICD-10-CM | POA: Diagnosis not present

## 2017-02-23 DIAGNOSIS — L570 Actinic keratosis: Secondary | ICD-10-CM | POA: Diagnosis not present

## 2017-02-28 ENCOUNTER — Telehealth (HOSPITAL_COMMUNITY): Payer: Self-pay

## 2017-02-28 NOTE — Telephone Encounter (Signed)
Encounter complete. 

## 2017-03-02 ENCOUNTER — Ambulatory Visit (HOSPITAL_COMMUNITY)
Admission: RE | Admit: 2017-03-02 | Discharge: 2017-03-02 | Disposition: A | Discharge status: Home / Self Care | Payer: Medicare HMO | Source: Ambulatory Visit | Attending: Internal Medicine | Admitting: Internal Medicine

## 2017-03-02 DIAGNOSIS — R9439 Abnormal result of other cardiovascular function study: Secondary | ICD-10-CM | POA: Insufficient documentation

## 2017-03-02 DIAGNOSIS — I4510 Unspecified right bundle-branch block: Secondary | ICD-10-CM | POA: Diagnosis not present

## 2017-03-02 DIAGNOSIS — Z955 Presence of coronary angioplasty implant and graft: Secondary | ICD-10-CM | POA: Insufficient documentation

## 2017-03-02 DIAGNOSIS — I422 Other hypertrophic cardiomyopathy: Secondary | ICD-10-CM

## 2017-03-02 DIAGNOSIS — R0600 Dyspnea, unspecified: Secondary | ICD-10-CM

## 2017-03-02 DIAGNOSIS — R5383 Other fatigue: Secondary | ICD-10-CM | POA: Diagnosis not present

## 2017-03-02 DIAGNOSIS — R0609 Other forms of dyspnea: Secondary | ICD-10-CM | POA: Diagnosis not present

## 2017-03-02 DIAGNOSIS — E782 Mixed hyperlipidemia: Secondary | ICD-10-CM | POA: Diagnosis not present

## 2017-03-02 DIAGNOSIS — R42 Dizziness and giddiness: Secondary | ICD-10-CM | POA: Insufficient documentation

## 2017-03-02 DIAGNOSIS — I252 Old myocardial infarction: Secondary | ICD-10-CM | POA: Diagnosis not present

## 2017-03-02 DIAGNOSIS — I2510 Atherosclerotic heart disease of native coronary artery without angina pectoris: Secondary | ICD-10-CM

## 2017-03-02 DIAGNOSIS — I10 Essential (primary) hypertension: Secondary | ICD-10-CM | POA: Diagnosis not present

## 2017-03-02 LAB — MYOCARDIAL PERFUSION IMAGING
CHL RATE OF PERCEIVED EXERTION: 17
CSEPEDS: 5 s
CSEPEW: 13.4 METS
CSEPPHR: 153 {beats}/min
Exercise duration (min): 11 min
LVDIAVOL: 145 mL (ref 62–150)
LVSYSVOL: 71 mL
MPHR: 153 {beats}/min
NUC STRESS TID: 1.07
Percent HR: 100 %
Rest HR: 72 {beats}/min
SDS: 2
SRS: 6
SSS: 8

## 2017-03-02 MED ORDER — TECHNETIUM TC 99M TETROFOSMIN IV KIT
10.1000 | PACK | Freq: Once | INTRAVENOUS | Status: AC | PRN
  Administered 2017-03-02: 10.1 via INTRAVENOUS
  Filled 2017-03-02: qty 11

## 2017-03-02 MED ORDER — TECHNETIUM TC 99M TETROFOSMIN IV KIT
30.8000 | PACK | Freq: Once | INTRAVENOUS | Status: AC | PRN
  Administered 2017-03-02: 30.8 via INTRAVENOUS
  Filled 2017-03-02: qty 31

## 2017-03-08 ENCOUNTER — Other Ambulatory Visit: Payer: Medicare HMO

## 2017-03-08 DIAGNOSIS — R748 Abnormal levels of other serum enzymes: Secondary | ICD-10-CM

## 2017-03-08 NOTE — Addendum Note (Signed)
Addended by: Fonnie Mu on: 03/08/2017 08:15 AM   Modules accepted: Orders

## 2017-03-08 NOTE — Progress Notes (Signed)
Pt states that Dr. Doug Sou office is drawing labs next week and he wishes to have his labs done all at one time.  Pt refused venipuncture today.  Charyl Bigger, CMA

## 2017-03-09 ENCOUNTER — Ambulatory Visit (INDEPENDENT_AMBULATORY_CARE_PROVIDER_SITE_OTHER): Payer: Medicare HMO | Admitting: Pharmacist

## 2017-03-09 ENCOUNTER — Other Ambulatory Visit: Payer: Self-pay

## 2017-03-09 ENCOUNTER — Ambulatory Visit (HOSPITAL_COMMUNITY): Payer: Medicare HMO | Attending: Internal Medicine

## 2017-03-09 DIAGNOSIS — I10 Essential (primary) hypertension: Secondary | ICD-10-CM | POA: Insufficient documentation

## 2017-03-09 DIAGNOSIS — I2510 Atherosclerotic heart disease of native coronary artery without angina pectoris: Secondary | ICD-10-CM | POA: Insufficient documentation

## 2017-03-09 DIAGNOSIS — E785 Hyperlipidemia, unspecified: Secondary | ICD-10-CM | POA: Diagnosis not present

## 2017-03-09 DIAGNOSIS — E782 Mixed hyperlipidemia: Secondary | ICD-10-CM | POA: Diagnosis not present

## 2017-03-09 DIAGNOSIS — I422 Other hypertrophic cardiomyopathy: Secondary | ICD-10-CM | POA: Diagnosis not present

## 2017-03-09 DIAGNOSIS — R0609 Other forms of dyspnea: Secondary | ICD-10-CM | POA: Insufficient documentation

## 2017-03-09 DIAGNOSIS — I340 Nonrheumatic mitral (valve) insufficiency: Secondary | ICD-10-CM | POA: Insufficient documentation

## 2017-03-09 DIAGNOSIS — I252 Old myocardial infarction: Secondary | ICD-10-CM | POA: Diagnosis not present

## 2017-03-09 DIAGNOSIS — R0600 Dyspnea, unspecified: Secondary | ICD-10-CM

## 2017-03-09 NOTE — Progress Notes (Signed)
Patient ID: Brandon Gonzales                 DOB: August 14, 1951                    MRN: [id number]     HPI:  Brandon Gonzales is a 66 y.o. male patient referred to lipid clinic by Dr. Martinique. PMH includes CAD s/p STEMI with stent placement, HTN, hyperlipidemia, and cardiomyopathy.   Noted patient has a documented intolerance to statins and allergy to zetia.  Patient presents today to discuss potential to enroll in a clinical trial or use of PCSK9i. Denies problems to current therapy and not interested on initiating PCSK9i therapy at this time.  Current Medications: niacin 1000mg  daily  Intolerances:  Lipitor 10mg   - severe leg cramp (04/05/2006) Simvastatin ?? Zetia   Risk Factors: CAD s/p STEMI; HTN, hyperlipidemia  LDL goal: <70   Diet: low fat, try to avoid fried foods, eats fresh fruits and vegetables  Exercise: regula walks  Labs: CHO 202; TG 99; HDL 36; LDL 146 (12/28/16)  Past Medical History:  Diagnosis Date  . Arthritis   . Cancer (HCC)    SKIN CA  SQUAMOUS CELL  . Coronary artery disease    a. 2007 Stent to LAD, PTCA D1;  b. DES to LAD & LCx 10/13 in the setting of STEMI.  [name] Kitchen Dyslipidemia    a. Statin and zetia-intolerant. On niacin.  [name] Kitchen GERD (gastroesophageal reflux disease)   . Heart murmur   . Hypertension   . Hypertrophic cardiomyopathy (Brandon Gonzales)    a. 09/2012 Echo: EF 55%, mid to apical anterolateral, posterior, apical anterior, apical HK, Gr1 DD, turbulence across LVOT suggesting degree of obstruction, mild Brandon, mildly dil LA.    Current Outpatient Prescriptions on File Prior to Visit  Medication Sig Dispense Refill  . amLODipine (NORVASC) 5 MG tablet Take 1 tablet (5 mg total) by mouth daily. 90 tablet 3  . aspirin EC 81 MG tablet Take 81 mg by mouth daily.    [name] Kitchen aspirin-acetaminophen-caffeine (EXCEDRIN MIGRAINE) 250-250-65 MG tablet Take 2 tablets by mouth daily as needed for headache.    . Inositol Niacinate (NIACIN FLUSH FREE) 500 MG CAPS Take 1,000 mg by mouth daily.     [name] Kitchen lisinopril (PRINIVIL,ZESTRIL) 20 MG tablet Take 1 tablet (20 mg total) by mouth daily. 90 tablet 1  . valACYclovir (VALTREX) 500 MG tablet Take 1 tablet (500 mg total) by mouth daily. (Patient taking differently: Take 500-1,000 mg by mouth See admin instructions. Take as needed for fever blisters -  Take 2 tablets (1000 mg) by mouth twice daily for 3-4 days, then take 1 tablet (500 mg) twice daily for 7 days, then stop.) 90 tablet 3   No current facility-administered medications on file prior to visit.     Allergies  Allergen Reactions  . Antihistamines, Chlorpheniramine-Type Other (See Comments)    Altered mental status  . Pheniramine Other (See Comments)    Altered mental status  . Statins Other (See Comments)    Leg cramps    . Zetia [Ezetimibe] Other (See Comments)    Leg cramps    Dyslipidemia: LDL remains above goal for secondary prevention.  Noted documented intolerance to statins and Zetia.  Patient is a poor historian and is unable to recall how many statins he failed in the past, nor able to remember his reaction to Zetia.  After completing chart reviewed I was able to find a prior  history of lipitor 10mg  stopped after severe myalgia, and Zetia reaction documented as severe myalgia as well. Currently patient is not interested on PCSK9i therapy but is greatly interested on entering [facility] or [facility] clinical trials.    Will request the paper charts for Brandon Gonzales and send all current information to the research team for assessment.  If patient is unable to get into a clinical trial, the patient agreed on starting the process for PCSK9i pre-approval.  Brandon Gonzales PharmD, [place] [address] [place] [address] 03/09/2017 12:24 PM

## 2017-03-11 NOTE — Patient Instructions (Signed)
Cholesterol Cholesterol is a fat. Your body needs a small amount of cholesterol. Cholesterol (plaque) may build up in your blood vessels (arteries). That makes you more likely to have a heart attack or stroke. You cannot feel your cholesterol level. Having a blood test is the only way to find out if your level is high. Keep your test results. Work with your doctor to keep your cholesterol at a good level. What do the results mean?  Total cholesterol is how much cholesterol is in your blood.  LDL is bad cholesterol. This is the type that can build up. Try to have low LDL.  HDL is good cholesterol. It cleans your blood vessels and carries LDL away. Try to have high HDL.  Triglycerides are fat that the body can store or burn for energy. What are good levels of cholesterol?  Total cholesterol below 200.  LDL below 100 is good for people who have health risks. LDL below 70 is good for people who have very high risks.  HDL above 40 is good. It is best to have HDL of 60 or higher.  Triglycerides below 150. How can I lower my cholesterol? Diet  Follow your diet program as told by your doctor.  Choose fish, white meat chicken, or turkey that is roasted or baked. Try not to eat red meat, fried foods, sausage, or lunch meats.  Eat lots of fresh fruits and vegetables.  Choose whole grains, beans, pasta, potatoes, and cereals.  Choose olive oil, corn oil, or canola oil. Only use small amounts.  Try not to eat butter, mayonnaise, shortening, or palm kernel oils.  Try not to eat foods with trans fats.  Choose low-fat or nonfat dairy foods.  Drink skim or nonfat milk.  Eat low-fat or nonfat yogurt and cheeses.  Try not to drink whole milk or cream.  Try not to eat ice cream, egg yolks, or full-fat cheeses.  Healthy desserts include angel food cake, ginger snaps, animal crackers, hard candy, popsicles, and low-fat or nonfat frozen yogurt. Try not to eat pastries, cakes, pies, and  cookies. Exercise  Follow your exercise program as told by your doctor.  Be more active. Try gardening, walking, and taking the stairs.  Ask your doctor about ways that you can be more active. Medicine  Take over-the-counter and prescription medicines only as told by your doctor. This information is not intended to replace advice given to you by your health care provider. Make sure you discuss any questions you have with your health care provider. Document Released: 02/16/2009 Document Revised: 06/21/2016 Document Reviewed: 06/01/2016 Elsevier Interactive Patient Education  2017 [employer]  

## 2017-03-19 ENCOUNTER — Telehealth: Payer: Self-pay | Admitting: Pharmacist

## 2017-03-19 NOTE — Telephone Encounter (Signed)
Call patient to update on CLEAR /ORION study.  Awaiting old "paper" medical chart for documentation of failed statins.  Otherwise , patient is a good candidate for clinical trial and research team already notified.

## 2017-04-18 ENCOUNTER — Encounter: Payer: Medicare HMO | Admitting: *Deleted

## 2017-04-18 VITALS — BP 142/76 | HR 66 | Ht 68.0 in | Wt 172.0 lb

## 2017-04-18 DIAGNOSIS — Z006 Encounter for examination for normal comparison and control in clinical research program: Secondary | ICD-10-CM

## 2017-04-18 NOTE — Progress Notes (Signed)
  CLEAR Informed Consent  Subject Name: Brandon Gonzales  Subject met inclusion and exclusion criteria. The informed consent form, study requirements and expectations were reviewed with the subject and questions and concerns were addressed prior to the signing of the consent form. The subject verbalized understanding of the trail requirements. The subject agreed to participate in the CLEAR trial and signed the informed consent. The informed consent was obtained prior to performance of any protocol-specific procedures for the subject. A copy of the signed informed consent was given to the subject and a copy was placed in the subject's medical record.  Jake Bathe Jr. 04/18/2017,0825AM

## 2017-05-02 ENCOUNTER — Other Ambulatory Visit: Payer: Self-pay | Admitting: *Deleted

## 2017-05-02 MED ORDER — AMBULATORY NON FORMULARY MEDICATION: 180.0000 mg | Freq: Every day | Status: DC

## 2017-05-10 ENCOUNTER — Telehealth: Payer: Self-pay | Admitting: Cardiology

## 2017-05-10 NOTE — Telephone Encounter (Signed)
Returned call to patient.He stated he feels awful.Stated he cannot walk 50 ft without being sob,right jaw pain,light headed.No chest pain.Feels like he did when he had his heart attack.Advised to go to [facility] ED.Trish called.

## 2017-05-10 NOTE — Telephone Encounter (Signed)
New message   Pt is calling asking for RN to call back. He did not say what it was about.

## 2017-05-10 NOTE — Telephone Encounter (Signed)
Returned call to patient-reports continued issues with SOB, fatigue, and lightheadedness since seeing Dr. Martinique.  Reports he had test performed and they were normal but still feels like something is not right.  Reports he gets SOB, lightheaded, and intermittent right jaw pain with the "slightest" bit of strenuous activity.  Reports fatigue as well.  States he feels "horrible".   Denies CP, states he has "never' had any chest pain.   Concerned and doesn't want to have a heart attack again.  Requesting to be seen before 6/21.    Dr. Martinique OOO until 6/14.  No availabilities.  Will route to Dr. Martinique for recommendations, as well as primary to assist with possible scheduling.

## 2017-05-11 ENCOUNTER — Telehealth: Payer: Self-pay | Admitting: Cardiology

## 2017-05-11 NOTE — Telephone Encounter (Signed)
New message  Pt call requesting to speak with RN. About his bp. Pt states he believes he needs a medication change for his low bp 98/68. Pt states he spoke with the RN about this previously. Please call back to discuss

## 2017-05-11 NOTE — Telephone Encounter (Signed)
Patient advised to continue holding amlodipine until 05/24/17 appointment

## 2017-05-11 NOTE — Telephone Encounter (Signed)
Spoke with pt he states that he feels much better today(pt called yesterday-denies sob,right jaw pain,light headedness or CP or pressure, pt did not go to the ER as directed yesterday) and that he held his amlodipine 5mg  today because his BP last night was 98-68 (his friend is a nurse and advised him to do), he took his BP at 830am 132/88 (after lisinopril) his BP now is 127/84 HR 81. He has appt with Dr Martinique 05-23-17. Should pt continue to hold amlodipine?

## 2017-05-11 NOTE — Telephone Encounter (Signed)
OK to hold amlodipine until I see him  Peter Martinique MD, [facility]

## 2017-05-14 ENCOUNTER — Ambulatory Visit: Payer: Medicare HMO | Admitting: Cardiology

## 2017-05-14 NOTE — Telephone Encounter (Signed)
Spoke to patient he wants to see Dr.Jordan.Stated he has not been doing much to cause symptoms.Appointment scheduled with Dr.Jordan 05/17/17 at 4:40 pm.Advised if symptoms return he needs to go to ED.

## 2017-05-14 NOTE — Telephone Encounter (Signed)
Spoke to patient.He stated he did not go to ED.Stated he continues to have symptoms as listed below.Appointment offered for today but he refused.Stated he cannot come until this Wed 6/13.Advised no openings this Wed.Advised I will have him put on cancellation list.Advised he needs to go to ED if he continues to have symptoms.

## 2017-05-15 DIAGNOSIS — I200 Unstable angina: Secondary | ICD-10-CM | POA: Diagnosis not present

## 2017-05-15 DIAGNOSIS — I252 Old myocardial infarction: Secondary | ICD-10-CM | POA: Insufficient documentation

## 2017-05-15 DIAGNOSIS — Z888 Allergy status to other drugs, medicaments and biological substances status: Secondary | ICD-10-CM | POA: Insufficient documentation

## 2017-05-15 DIAGNOSIS — R079 Chest pain, unspecified: Secondary | ICD-10-CM | POA: Diagnosis present

## 2017-05-15 DIAGNOSIS — I129 Hypertensive chronic kidney disease with stage 1 through stage 4 chronic kidney disease, or unspecified chronic kidney disease: Secondary | ICD-10-CM | POA: Diagnosis not present

## 2017-05-15 DIAGNOSIS — E785 Hyperlipidemia, unspecified: Secondary | ICD-10-CM | POA: Diagnosis not present

## 2017-05-15 DIAGNOSIS — Z85828 Personal history of other malignant neoplasm of skin: Secondary | ICD-10-CM | POA: Insufficient documentation

## 2017-05-15 DIAGNOSIS — Z79899 Other long term (current) drug therapy: Secondary | ICD-10-CM | POA: Diagnosis not present

## 2017-05-15 DIAGNOSIS — Y831 Surgical operation with implant of artificial internal device as the cause of abnormal reaction of the patient, or of later complication, without mention of misadventure at the time of the procedure: Secondary | ICD-10-CM | POA: Diagnosis not present

## 2017-05-15 DIAGNOSIS — I25110 Atherosclerotic heart disease of native coronary artery with unstable angina pectoris: Secondary | ICD-10-CM | POA: Insufficient documentation

## 2017-05-15 DIAGNOSIS — I422 Other hypertrophic cardiomyopathy: Secondary | ICD-10-CM | POA: Insufficient documentation

## 2017-05-15 DIAGNOSIS — T82855A Stenosis of coronary artery stent, initial encounter: Principal | ICD-10-CM | POA: Insufficient documentation

## 2017-05-15 DIAGNOSIS — R0602 Shortness of breath: Secondary | ICD-10-CM | POA: Diagnosis not present

## 2017-05-15 DIAGNOSIS — N289 Disorder of kidney and ureter, unspecified: Secondary | ICD-10-CM | POA: Diagnosis not present

## 2017-05-15 DIAGNOSIS — N183 Chronic kidney disease, stage 3 (moderate): Secondary | ICD-10-CM | POA: Insufficient documentation

## 2017-05-15 DIAGNOSIS — Z7982 Long term (current) use of aspirin: Secondary | ICD-10-CM | POA: Insufficient documentation

## 2017-05-15 DIAGNOSIS — Z955 Presence of coronary angioplasty implant and graft: Secondary | ICD-10-CM | POA: Insufficient documentation

## 2017-05-16 ENCOUNTER — Observation Stay (HOSPITAL_COMMUNITY)
Admission: EM | Admit: 2017-05-16 | Discharge: 2017-05-17 | Disposition: A | Discharge status: Home / Self Care | Payer: Medicare HMO | Attending: Internal Medicine | Admitting: Internal Medicine

## 2017-05-16 ENCOUNTER — Encounter (HOSPITAL_COMMUNITY): Payer: Self-pay | Admitting: *Deleted

## 2017-05-16 ENCOUNTER — Observation Stay (HOSPITAL_BASED_OUTPATIENT_CLINIC_OR_DEPARTMENT_OTHER): Payer: Medicare HMO

## 2017-05-16 ENCOUNTER — Emergency Department (HOSPITAL_COMMUNITY): Payer: Medicare HMO

## 2017-05-16 ENCOUNTER — Other Ambulatory Visit: Payer: Self-pay

## 2017-05-16 ENCOUNTER — Encounter (HOSPITAL_COMMUNITY)
Admission: EM | Disposition: A | Discharge status: Home / Self Care | Payer: Self-pay | Source: Home / Self Care | Attending: Emergency Medicine

## 2017-05-16 DIAGNOSIS — I10 Essential (primary) hypertension: Secondary | ICD-10-CM

## 2017-05-16 DIAGNOSIS — E785 Hyperlipidemia, unspecified: Secondary | ICD-10-CM | POA: Diagnosis not present

## 2017-05-16 DIAGNOSIS — I200 Unstable angina: Secondary | ICD-10-CM

## 2017-05-16 DIAGNOSIS — R079 Chest pain, unspecified: Secondary | ICD-10-CM | POA: Diagnosis present

## 2017-05-16 DIAGNOSIS — I159 Secondary hypertension, unspecified: Secondary | ICD-10-CM | POA: Diagnosis not present

## 2017-05-16 DIAGNOSIS — N289 Disorder of kidney and ureter, unspecified: Secondary | ICD-10-CM

## 2017-05-16 DIAGNOSIS — I422 Other hypertrophic cardiomyopathy: Secondary | ICD-10-CM | POA: Diagnosis not present

## 2017-05-16 DIAGNOSIS — N183 Chronic kidney disease, stage 3 unspecified: Secondary | ICD-10-CM | POA: Diagnosis present

## 2017-05-16 DIAGNOSIS — I350 Nonrheumatic aortic (valve) stenosis: Secondary | ICD-10-CM

## 2017-05-16 DIAGNOSIS — I129 Hypertensive chronic kidney disease with stage 1 through stage 4 chronic kidney disease, or unspecified chronic kidney disease: Secondary | ICD-10-CM | POA: Diagnosis not present

## 2017-05-16 DIAGNOSIS — Z7982 Long term (current) use of aspirin: Secondary | ICD-10-CM | POA: Diagnosis not present

## 2017-05-16 DIAGNOSIS — Z9861 Coronary angioplasty status: Secondary | ICD-10-CM

## 2017-05-16 DIAGNOSIS — I2510 Atherosclerotic heart disease of native coronary artery without angina pectoris: Secondary | ICD-10-CM | POA: Diagnosis present

## 2017-05-16 DIAGNOSIS — I25110 Atherosclerotic heart disease of native coronary artery with unstable angina pectoris: Secondary | ICD-10-CM

## 2017-05-16 DIAGNOSIS — I25118 Atherosclerotic heart disease of native coronary artery with other forms of angina pectoris: Secondary | ICD-10-CM | POA: Diagnosis not present

## 2017-05-16 DIAGNOSIS — Z79899 Other long term (current) drug therapy: Secondary | ICD-10-CM | POA: Diagnosis not present

## 2017-05-16 DIAGNOSIS — Z955 Presence of coronary angioplasty implant and graft: Secondary | ICD-10-CM | POA: Diagnosis not present

## 2017-05-16 DIAGNOSIS — T82855A Stenosis of coronary artery stent, initial encounter: Secondary | ICD-10-CM | POA: Diagnosis not present

## 2017-05-16 DIAGNOSIS — R0602 Shortness of breath: Secondary | ICD-10-CM | POA: Diagnosis not present

## 2017-05-16 DIAGNOSIS — I208 Other forms of angina pectoris: Secondary | ICD-10-CM | POA: Diagnosis present

## 2017-05-16 DIAGNOSIS — I252 Old myocardial infarction: Secondary | ICD-10-CM | POA: Diagnosis not present

## 2017-05-16 HISTORY — PX: LEFT HEART CATH AND CORONARY ANGIOGRAPHY: CATH118249

## 2017-05-16 HISTORY — PX: CORONARY BALLOON ANGIOPLASTY: CATH118233

## 2017-05-16 LAB — BASIC METABOLIC PANEL
Anion gap: 9 (ref 5–15)
BUN: 15 mg/dL (ref 6–20)
CALCIUM: 10 mg/dL (ref 8.9–10.3)
CO2: 30 mmol/L (ref 22–32)
Chloride: 101 mmol/L (ref 101–111)
Creatinine, Ser: 1.28 mg/dL — ABNORMAL HIGH (ref 0.61–1.24)
GFR calc non Af Amer: 56 mL/min — ABNORMAL LOW (ref >60)
Glucose, Bld: 101 mg/dL — ABNORMAL HIGH (ref 65–99)
Potassium: 4.2 mmol/L (ref 3.5–5.1)
Sodium: 140 mmol/L (ref 135–145)

## 2017-05-16 LAB — TROPONIN I: Troponin I: 0.03 ng/mL (ref <0.03)

## 2017-05-16 LAB — D-DIMER, QUANTITATIVE: D-Dimer, Quant: 0.28 ug/mL-FEU (ref 0.00–0.50)

## 2017-05-16 LAB — CBC
HCT: 47.9 % (ref 39.0–52.0)
Hemoglobin: 16.4 g/dL (ref 13.0–17.0)
MCH: 31.1 pg (ref 26.0–34.0)
MCHC: 34.2 g/dL (ref 30.0–36.0)
MCV: 90.9 fL (ref 78.0–100.0)
PLATELETS: 185 10*3/uL (ref 150–400)
RBC: 5.27 MIL/uL (ref 4.22–5.81)
RDW: 12.9 % (ref 11.5–15.5)
WBC: 7.4 10*3/uL (ref 4.0–10.5)

## 2017-05-16 LAB — LIPID PANEL
CHOL/HDL RATIO: 5.8 ratio
CHOLESTEROL: 163 mg/dL (ref 0–200)
HDL: 28 mg/dL — ABNORMAL LOW (ref >40)
LDL Cholesterol: 81 mg/dL (ref 0–99)
Triglycerides: 269 mg/dL — ABNORMAL HIGH (ref <150)
VLDL: 54 mg/dL — ABNORMAL HIGH (ref 0–40)

## 2017-05-16 LAB — POCT ACTIVATED CLOTTING TIME
ACTIVATED CLOTTING TIME: 246 s
ACTIVATED CLOTTING TIME: 411 s

## 2017-05-16 LAB — RAPID URINE DRUG SCREEN, HOSP PERFORMED
AMPHETAMINES: NOT DETECTED
BARBITURATES: NOT DETECTED
BENZODIAZEPINES: NOT DETECTED
Cocaine: NOT DETECTED
Opiates: NOT DETECTED
Tetrahydrocannabinol: NOT DETECTED

## 2017-05-16 LAB — ECHOCARDIOGRAM LIMITED
HEIGHTINCHES: 68 in
Weight: 2720 oz

## 2017-05-16 LAB — MRSA PCR SCREENING: MRSA by PCR: NEGATIVE

## 2017-05-16 LAB — I-STAT TROPONIN, ED: TROPONIN I, POC: 0.02 ng/mL (ref 0.00–0.08)

## 2017-05-16 LAB — PROTIME-INR
INR: 1.06
PROTHROMBIN TIME: 13.8 s (ref 11.4–15.2)

## 2017-05-16 LAB — HEPARIN LEVEL (UNFRACTIONATED): HEPARIN UNFRACTIONATED: 0.55 [IU]/mL (ref 0.30–0.70)

## 2017-05-16 SURGERY — LEFT HEART CATH AND CORONARY ANGIOGRAPHY
Anesthesia: LOCAL

## 2017-05-16 MED ORDER — MIDAZOLAM HCL 2 MG/2ML IJ SOLN
INTRAMUSCULAR | Status: AC
  Filled 2017-05-16: qty 2

## 2017-05-16 MED ORDER — HEPARIN (PORCINE) IN NACL 2-0.9 UNIT/ML-% IJ SOLN
INTRAMUSCULAR | Status: AC
  Filled 2017-05-16: qty 1000

## 2017-05-16 MED ORDER — SODIUM CHLORIDE 0.9 % IV SOLN: 250.0000 mL | INTRAVENOUS | Status: DC | PRN

## 2017-05-16 MED ORDER — HEPARIN BOLUS VIA INFUSION
4000.0000 [IU] | Freq: Once | INTRAVENOUS | Status: AC
  Administered 2017-05-16: 4000 [IU] via INTRAVENOUS
  Filled 2017-05-16: qty 4000

## 2017-05-16 MED ORDER — NITROGLYCERIN 1 MG/10 ML FOR IR/CATH LAB
INTRA_ARTERIAL | Status: AC
  Filled 2017-05-16: qty 10

## 2017-05-16 MED ORDER — CARVEDILOL 3.125 MG PO TABS
3.1250 mg | ORAL_TABLET | Freq: Two times a day (BID) | ORAL | Status: DC
  Administered 2017-05-17: 3.125 mg via ORAL
  Filled 2017-05-16: qty 1

## 2017-05-16 MED ORDER — FENTANYL CITRATE (PF) 100 MCG/2ML IJ SOLN
INTRAMUSCULAR | Status: DC | PRN
  Administered 2017-05-16: 25 ug via INTRAVENOUS

## 2017-05-16 MED ORDER — ASPIRIN 81 MG PO CHEW
81.0000 mg | CHEWABLE_TABLET | Freq: Every day | ORAL | Status: DC
  Administered 2017-05-17: 81 mg via ORAL
  Filled 2017-05-16: qty 1

## 2017-05-16 MED ORDER — HEPARIN (PORCINE) IN NACL 100-0.45 UNIT/ML-% IJ SOLN
1050.0000 [IU]/h | INTRAMUSCULAR | Status: DC
  Administered 2017-05-16: 1050 [IU]/h via INTRAVENOUS
  Filled 2017-05-16: qty 250

## 2017-05-16 MED ORDER — HEPARIN SODIUM (PORCINE) 1000 UNIT/ML IJ SOLN
INTRAMUSCULAR | Status: AC
  Filled 2017-05-16: qty 1

## 2017-05-16 MED ORDER — PNEUMOCOCCAL VAC POLYVALENT 25 MCG/0.5ML IJ INJ: 0.5000 mL | INJECTION | INTRAMUSCULAR | Status: DC

## 2017-05-16 MED ORDER — ASPIRIN EC 325 MG PO TBEC
325.0000 mg | DELAYED_RELEASE_TABLET | Freq: Every day | ORAL | Status: DC
Start: 2017-05-16 — End: 2017-05-16
  Filled 2017-05-16: qty 1

## 2017-05-16 MED ORDER — LISINOPRIL 20 MG PO TABS
20.0000 mg | ORAL_TABLET | Freq: Every day | ORAL | Status: DC
  Administered 2017-05-16 – 2017-05-17 (×2): 20 mg via ORAL
  Filled 2017-05-16 (×2): qty 1

## 2017-05-16 MED ORDER — SODIUM CHLORIDE 0.9% FLUSH: 3.0000 mL | INTRAVENOUS | Status: DC | PRN

## 2017-05-16 MED ORDER — ASPIRIN 81 MG PO CHEW
324.0000 mg | CHEWABLE_TABLET | Freq: Once | ORAL | Status: DC
  Administered 2017-05-16: 243 mg via ORAL
  Filled 2017-05-16: qty 4

## 2017-05-16 MED ORDER — SODIUM CHLORIDE 0.9% FLUSH
3.0000 mL | Freq: Two times a day (BID) | INTRAVENOUS | Status: DC
  Administered 2017-05-16 – 2017-05-17 (×2): 3 mL via INTRAVENOUS

## 2017-05-16 MED ORDER — ACETAMINOPHEN 325 MG PO TABS: 650.0000 mg | ORAL_TABLET | ORAL | Status: DC | PRN

## 2017-05-16 MED ORDER — HEPARIN BOLUS VIA INFUSION
4000.0000 [IU] | Freq: Once | INTRAVENOUS | Status: DC
  Filled 2017-05-16: qty 4000

## 2017-05-16 MED ORDER — IOPAMIDOL (ISOVUE-370) INJECTION 76%
INTRAVENOUS | Status: AC
  Filled 2017-05-16: qty 100

## 2017-05-16 MED ORDER — HEPARIN SODIUM (PORCINE) 1000 UNIT/ML IJ SOLN
INTRAMUSCULAR | Status: DC | PRN
  Administered 2017-05-16 (×2): 4500 [IU] via INTRAVENOUS
  Administered 2017-05-16: 3000 [IU] via INTRAVENOUS

## 2017-05-16 MED ORDER — AMLODIPINE BESYLATE 5 MG PO TABS
5.0000 mg | ORAL_TABLET | Freq: Every day | ORAL | Status: DC
  Filled 2017-05-16: qty 1

## 2017-05-16 MED ORDER — CALCIUM CARBONATE ANTACID 500 MG PO CHEW: 4.0000 | CHEWABLE_TABLET | Freq: Every day | ORAL | Status: DC | PRN

## 2017-05-16 MED ORDER — ONDANSETRON HCL 4 MG/2ML IJ SOLN: 4.0000 mg | Freq: Four times a day (QID) | INTRAMUSCULAR | Status: DC | PRN

## 2017-05-16 MED ORDER — INOSITOL NIACINATE 500 MG PO CAPS: 1000.0000 mg | ORAL_CAPSULE | Freq: Every day | ORAL | Status: DC

## 2017-05-16 MED ORDER — LABETALOL HCL 5 MG/ML IV SOLN: 10.0000 mg | INTRAVENOUS | Status: AC | PRN

## 2017-05-16 MED ORDER — SODIUM CHLORIDE 0.9 % IV SOLN
INTRAVENOUS | Status: DC
  Administered 2017-05-16: 100 mL/h via INTRAVENOUS

## 2017-05-16 MED ORDER — HEPARIN (PORCINE) IN NACL 2-0.9 UNIT/ML-% IJ SOLN
INTRAMUSCULAR | Status: AC | PRN
  Administered 2017-05-16: 1000 mL

## 2017-05-16 MED ORDER — NITROGLYCERIN 1 MG/10 ML FOR IR/CATH LAB
INTRA_ARTERIAL | Status: DC | PRN
  Administered 2017-05-16: 200 ug via INTRACORONARY

## 2017-05-16 MED ORDER — HEPARIN (PORCINE) IN NACL 2-0.9 UNIT/ML-% IJ SOLN
INTRAMUSCULAR | Status: DC | PRN
  Administered 2017-05-16: 10 mL via INTRA_ARTERIAL

## 2017-05-16 MED ORDER — VERAPAMIL HCL 2.5 MG/ML IV SOLN
INTRAVENOUS | Status: AC
  Filled 2017-05-16: qty 2

## 2017-05-16 MED ORDER — ASPIRIN-ACETAMINOPHEN-CAFFEINE 250-250-65 MG PO TABS
2.0000 | ORAL_TABLET | Freq: Every day | ORAL | Status: DC | PRN
  Filled 2017-05-16: qty 2

## 2017-05-16 MED ORDER — SODIUM CHLORIDE 0.9% FLUSH: 3.0000 mL | Freq: Two times a day (BID) | INTRAVENOUS | Status: DC

## 2017-05-16 MED ORDER — ENOXAPARIN SODIUM 40 MG/0.4ML ~~LOC~~ SOLN: 40.0000 mg | SUBCUTANEOUS | Status: DC

## 2017-05-16 MED ORDER — TICAGRELOR 90 MG PO TABS
90.0000 mg | ORAL_TABLET | Freq: Two times a day (BID) | ORAL | Status: DC
  Administered 2017-05-17: 90 mg via ORAL
  Filled 2017-05-16: qty 1

## 2017-05-16 MED ORDER — TICAGRELOR 90 MG PO TABS
ORAL_TABLET | ORAL | Status: AC
  Filled 2017-05-16: qty 2

## 2017-05-16 MED ORDER — HYDRALAZINE HCL 20 MG/ML IJ SOLN: 5.0000 mg | INTRAMUSCULAR | Status: DC | PRN

## 2017-05-16 MED ORDER — MIDAZOLAM HCL 2 MG/2ML IJ SOLN
INTRAMUSCULAR | Status: DC | PRN
  Administered 2017-05-16: 2 mg via INTRAVENOUS

## 2017-05-16 MED ORDER — LIDOCAINE HCL 1 % IJ SOLN
INTRAMUSCULAR | Status: AC
  Filled 2017-05-16: qty 20

## 2017-05-16 MED ORDER — SODIUM CHLORIDE 0.9 % IV SOLN
INTRAVENOUS | Status: DC
  Administered 2017-05-16: 07:00:00 via INTRAVENOUS

## 2017-05-16 MED ORDER — ZOLPIDEM TARTRATE 5 MG PO TABS: 5.0000 mg | ORAL_TABLET | Freq: Every evening | ORAL | Status: DC | PRN

## 2017-05-16 MED ORDER — NITROGLYCERIN IN D5W 200-5 MCG/ML-% IV SOLN
10.0000 ug/min | INTRAVENOUS | Status: DC
  Administered 2017-05-16: 10 ug/min via INTRAVENOUS
  Filled 2017-05-16: qty 250

## 2017-05-16 MED ORDER — MORPHINE SULFATE (PF) 4 MG/ML IV SOLN: 2.0000 mg | INTRAVENOUS | Status: DC | PRN

## 2017-05-16 MED ORDER — SODIUM CHLORIDE 0.9 % IV SOLN
INTRAVENOUS | Status: DC
  Administered 2017-05-16: 125 mL/h via INTRAVENOUS

## 2017-05-16 MED ORDER — TICAGRELOR 90 MG PO TABS
ORAL_TABLET | ORAL | Status: DC | PRN
  Administered 2017-05-16: 180 mg via ORAL

## 2017-05-16 MED ORDER — LIDOCAINE HCL (PF) 1 % IJ SOLN
INTRAMUSCULAR | Status: DC | PRN
  Administered 2017-05-16: 2 mL

## 2017-05-16 MED ORDER — FENTANYL CITRATE (PF) 100 MCG/2ML IJ SOLN
INTRAMUSCULAR | Status: AC
  Filled 2017-05-16: qty 2

## 2017-05-16 MED ORDER — NIACIN ER 500 MG PO CPCR
1000.0000 mg | ORAL_CAPSULE | Freq: Every day | ORAL | Status: DC
  Filled 2017-05-16: qty 2

## 2017-05-16 MED ORDER — HEPARIN (PORCINE) IN NACL 100-0.45 UNIT/ML-% IJ SOLN
1050.0000 [IU]/h | INTRAMUSCULAR | Status: DC
  Filled 2017-05-16: qty 250

## 2017-05-16 SURGICAL SUPPLY — 20 items
BALLN EMERGE MR 2.5X12 (BALLOONS) ×2
BALLN WOLVERINE 3.00X15 (BALLOONS) ×2
BALLN ~~LOC~~ EMERGE MR 3.25X15 (BALLOONS) ×2
BALLOON EMERGE MR 2.5X12 (BALLOONS) IMPLANT
BALLOON WOLVERINE 3.00X15 (BALLOONS) IMPLANT
BALLOON ~~LOC~~ EMERGE MR 3.25X15 (BALLOONS) IMPLANT
CATH INFINITI 5FR ANG PIGTAIL (CATHETERS) ×1 IMPLANT
CATH LAUNCHER 6FR EBU3.5 (CATHETERS) ×2 IMPLANT
CATH OPTITORQUE TIG 4.0 5F (CATHETERS) ×1 IMPLANT
DEVICE RAD COMP TR BAND LRG (VASCULAR PRODUCTS) ×1 IMPLANT
GLIDESHEATH SLEND A-KIT 6F 22G (SHEATH) ×1 IMPLANT
GUIDEWIRE INQWIRE 1.5J.035X260 (WIRE) IMPLANT
INQWIRE 1.5J .035X260CM (WIRE) ×2
KIT ENCORE 26 ADVANTAGE (KITS) ×1 IMPLANT
KIT HEART LEFT (KITS) ×2 IMPLANT
PACK CARDIAC CATHETERIZATION (CUSTOM PROCEDURE TRAY) ×2 IMPLANT
TRANSDUCER W/STOPCOCK (MISCELLANEOUS) ×2 IMPLANT
TUBING CIL FLEX 10 FLL-RA (TUBING) ×2 IMPLANT
VALVE GUARDIAN II ~~LOC~~ HEMO (MISCELLANEOUS) ×1 IMPLANT
WIRE MARVEL STR TIP 190CM (WIRE) ×1 IMPLANT

## 2017-05-16 NOTE — Progress Notes (Signed)
  Echocardiogram 2D Echocardiogram has been performed.  Darlina Sicilian M 05/16/2017, 12:01 PM

## 2017-05-16 NOTE — Progress Notes (Signed)
[place] for Heparin Indication: chest pain/ACS  Allergies  Allergen Reactions  . Antihistamines, Chlorpheniramine-Type Other (See Comments)    Altered mental status  . Pheniramine Other (See Comments)    Altered mental status  . Statins Other (See Comments)    Leg cramps    . Zetia [Ezetimibe] Other (See Comments)    Leg cramps    Patient Measurements: Height: 5\' 8"  (172.7 cm) Weight: 170 lb (77.1 kg) IBW/kg (Calculated) : 68.4 Heparin Dosing Weight: 77 kg  Vital Signs: Temp: 97.6 F (36.4 C) (06/13 0430) Temp Source: Oral (06/13 0430) BP: 148/90 (06/13 1200) Pulse Rate: 71 (06/13 1200)  Labs:  Recent Labs  05/16/17 0002 05/16/17 0259 05/16/17 0759 05/16/17 1158  HGB 16.4  --   --   --   HCT 47.9  --   --   --   PLT 185  --   --   --   LABPROT  --   --   --  13.8  INR  --   --   --  1.06  HEPARINUNFRC  --   --   --  0.55  CREATININE 1.28*  --   --   --   TROPONINI  --  0.03* <0.03  --     Estimated Creatinine Clearance: 53.4 mL/min (A) (by C-G formula based on SCr of 1.28 mg/dL (H)).   Medical History: Past Medical History:  Diagnosis Date  . Arthritis   . Cancer (HCC)    SKIN CA  SQUAMOUS CELL  . Coronary artery disease    a. 2007 Stent to LAD, PTCA D1;  b. DES to LAD & LCx 10/13 in the setting of STEMI.  [name] Kitchen Dyslipidemia    a. Statin and zetia-intolerant. On niacin.  [name] Kitchen GERD (gastroesophageal reflux disease)   . Heart murmur   . Hypertension   . Hypertrophic cardiomyopathy ([place])    a. 09/2012 Echo: EF 55%, mid to apical anterolateral, posterior, apical anterior, apical HK, Gr1 DD, turbulence across LVOT suggesting degree of obstruction, mild MR, mildly dil LA.    Assessment: 66 y.o. M presents with CP. To begin heparin for r/o ACS. No AC PTA. CBC ok on admission.  Heparin level is therapeutic at 0.55 on heparin 1050 units/hr. No issues with infusion or bleeding noted. Patient may go for cath today, will  follow-up once completed.  Goal of Therapy:  Heparin level 0.3-0.7 units/ml Monitor platelets by anticoagulation protocol: Yes   Plan:  Heparin 1050 units/hr Daily heparin level and CBC Monitor s/sx of bleeding  Andrey Cota. Diona Foley, PharmD, [facility] Clinical Pharmacist [phone] 05/16/2017,1:12 PM

## 2017-05-16 NOTE — ED Provider Notes (Signed)
[place] DEPT Provider Note   CSN: [id number] Arrival date & time: 05/15/17  2355  By signing my name below, I, Mayer Masker, attest that this documentation has been prepared under the direction and in the presence of Delora Fuel, MD. Electronically Signed: Mayer Masker, Scribe. 05/16/17. 2:17 AM.  History   Chief Complaint Chief Complaint  Patient presents with  . Chest Pain   The history is provided by the patient. No language interpreter was used.   HPI Comments: Brandon Gonzales is a 66 y.o. male with a PMHx of HTN, HLD, MI and PSHx of 3 coronary stents placed who presents to the Emergency Department complaining of waxing/waning, gradually worsening CP that began 2-3 weeks ago. He has associated SOB, diaphoresis, and light-headedness. He states his pain feels like a pressure and like something is "sitting on my chest" and radiates to his right jaw, left shoulder, side, and back. He states in the last week his CP has worsened when he walks 50 feet or less. His symptoms improve when he rests and he has not awakened at night due to the pain. He denies nausea and weakness. Pt takes 81 mg aspirin daily. He has an appointment with his cardiologist, Dr. Martinique, on Thursday. Pt does not smoke and denies having DM. Past Medical History:  Diagnosis Date  . Arthritis   . Cancer (HCC)    SKIN CA  SQUAMOUS CELL  . Coronary artery disease    a. 2007 Stent to LAD, PTCA D1;  b. DES to LAD & LCx 10/13 in the setting of STEMI.  [name] Kitchen Dyslipidemia    a. Statin and zetia-intolerant. On niacin.  [name] Kitchen GERD (gastroesophageal reflux disease)   . Heart murmur   . Hypertension   . Hypertrophic cardiomyopathy ([place])    a. 09/2012 Echo: EF 55%, mid to apical anterolateral, posterior, apical anterior, apical HK, Gr1 DD, turbulence across LVOT suggesting degree of obstruction, mild MR, mildly dil LA.    Patient Active Problem List   Diagnosis Date Noted  . Dyspnea on exertion 02/21/2017  . Neck pain  12/28/2016  . NSVT (nonsustained ventricular tachycardia) ([place]) 09/25/2012  . Hypertrophic obstructive cardiomyopathy(425.11) 09/25/2012  . CKD (chronic kidney disease) stage 3, GFR 30-59 ml/min 09/25/2012  . ST elevation myocardial infarction (STEMI) of lateral wall ([place]) 09/22/2012  . CAD (coronary artery disease) 01/11/2012  . HTN (hypertension) 01/11/2012  . Dyslipidemia   . H/O gastroesophageal reflux (GERD)   . Arthritis     Past Surgical History:  Procedure Laterality Date  . CARDIAC CATHETERIZATION  2007   stent to LAD and PTCA of diagonal branch  . CARDIAC CATHETERIZATION  2010  . CORONARY ANGIOPLASTY WITH STENT PLACEMENT  2013   95-99% prox LAD, patent LAD stent distal to this, occluded mid-dist LCx, mild <10% RCA irreg; s/p DES-prox LAD & DES to mid-dist LCx  . [place]   right  . [place]   left  . LEFT HEART CATH Bilateral 09/22/2012   Procedure: LEFT HEART CATH;  Surgeon: Peter M Martinique, MD;  Location: [facility] CATH LAB;  Service: Cardiovascular;  Laterality: Bilateral;  . PERCUTANEOUS CORONARY STENT INTERVENTION (PCI-S) Right 09/22/2012   Procedure: PERCUTANEOUS CORONARY STENT INTERVENTION (PCI-S);  Surgeon: Peter M Martinique, MD;  Location: [facility] CATH LAB;  Service: Cardiovascular;  Laterality: Right;       Home Medications    Prior to Admission medications   Medication Sig Start Date End Date Taking? Authorizing Provider  AMBULATORY NON FORMULARY MEDICATION Take 180 mg by mouth daily. Medication Name: bempedoic acid vs a placebo CLEAR Research Study drug provided 05/02/17   Lelon Perla, MD  amLODipine (NORVASC) 5 MG tablet Take 1 tablet (5 mg total) by mouth daily. 02/21/17   Martinique, Peter M, MD  aspirin EC 81 MG tablet Take 81 mg by mouth daily.    [provider]  aspirin-acetaminophen-caffeine (EXCEDRIN MIGRAINE) [phone] MG tablet Take 2 tablets by mouth daily as needed for headache.    [provider]  Inositol Niacinate  (NIACIN FLUSH FREE) 500 MG CAPS Take 1,000 mg by mouth daily.    [provider]  lisinopril (PRINIVIL,ZESTRIL) 20 MG tablet Take 1 tablet (20 mg total) by mouth daily. 12/27/16   Rogelia Mire, NP  valACYclovir (VALTREX) 500 MG tablet Take 1 tablet (500 mg total) by mouth daily. Patient taking differently: Take 500-1,000 mg by mouth See admin instructions. Take as needed for fever blisters -  Take 2 tablets (1000 mg) by mouth twice daily for 3-4 days, then take 1 tablet (500 mg) twice daily for 7 days, then stop. 12/14/16   DanfordBerna Spare, NP    Family History Family History  Problem Relation Age of Onset  . Heart attack Mother 46  . Heart disease Mother     Social History Social History  Substance Use Topics  . Smoking status: Never Smoker  . Smokeless tobacco: Never Used  . Alcohol use No     Allergies   Antihistamines, chlorpheniramine-type; Pheniramine; Statins; and Zetia [ezetimibe]   Review of Systems Review of Systems  Constitutional: Positive for diaphoresis.  Respiratory: Positive for shortness of breath.   Cardiovascular: Positive for chest pain.  Gastrointestinal: Negative for nausea.  Genitourinary: Positive for flank pain.  Musculoskeletal: Positive for back pain.  Neurological: Negative for weakness.  All other systems reviewed and are negative.    Physical Exam Updated Vital Signs BP (!) 132/94 (BP Location: Right Arm)   Pulse (!) 59   Temp 98 F (36.7 C) (Oral)   Resp 14   SpO2 97%   Physical Exam  Constitutional: He is oriented to person, place, and time. He appears well-developed and well-nourished.  HENT:  Head: Normocephalic and atraumatic.  Eyes: EOM are normal. Pupils are equal, round, and reactive to light.  Neck: Normal range of motion. Neck supple. No JVD present.  Cardiovascular: Normal rate and regular rhythm.   Murmur heard. 2/6 systolic ejection murmur best heard at left sternal border  Pulmonary/Chest: Effort  normal and breath sounds normal. He has no wheezes. He has no rales. He exhibits no tenderness.  Abdominal: Soft. Bowel sounds are normal. He exhibits no distension and no mass. There is no tenderness.  Musculoskeletal: Normal range of motion. He exhibits no edema.  Lymphadenopathy:    He has no cervical adenopathy.  Neurological: He is alert and oriented to person, place, and time. No cranial nerve deficit. He exhibits normal muscle tone. Coordination normal.  Skin: Skin is warm and dry. No rash noted.  Psychiatric: He has a normal mood and affect. His behavior is normal. Judgment and thought content normal.  Nursing note and vitals reviewed.    ED Treatments / Results  DIAGNOSTIC STUDIES: Oxygen Saturation is 97% on RA, normal by my interpretation.    COORDINATION OF CARE: 2:16 AM Discussed treatment plan with pt at bedside and pt agreed to plan.  Labs (all labs ordered are listed, but only abnormal results  are displayed) Labs Reviewed  BASIC METABOLIC PANEL - Abnormal; Notable for the following:       Result Value   Glucose, Bld 101 (*)    Creatinine, Ser 1.28 (*)    GFR calc non Af Amer 56 (*)    All other components within normal limits  CBC  I-STAT TROPOININ, ED    EKG  EKG Interpretation  Date/Time:  Wednesday May 16 2017 00:00:09 EDT Ventricular Rate:  88 PR Interval:  154 QRS Duration: 140 QT Interval:  396 QTC Calculation: 479 R Axis:   -61 Text Interpretation:  Normal sinus rhythm Right bundle branch block Left anterior fascicular block ** Bifascicular block ** Abnormal ECG When compared with ECG of 02/19/2017, No significant change was found Confirmed by Delora Fuel ([id number]) on 05/16/2017 12:03:57 AM       Radiology Dg Chest 2 View  Result Date: 05/16/2017 CLINICAL DATA:  Chest pain and shortness of breath EXAM: CHEST  2 VIEW COMPARISON:  02/19/2017 FINDINGS: Minimal atelectasis at the right base. No focal consolidation or effusion. Heart size upper limits  of normal. No pneumothorax. IMPRESSION: No active cardiopulmonary disease. Electronically Signed   By: Donavan Foil M.D.   On: 05/16/2017 01:22    Procedures Procedures (including critical care time) CRITICAL CARE Performed by: IRCVE,LFYBO Total critical care time: 45 minutes Critical care time was exclusive of separately billable procedures and treating other patients. Critical care was necessary to treat or prevent imminent or life-threatening deterioration. Critical care was time spent personally by me on the following activities: development of treatment plan with patient and/or surrogate as well as nursing, discussions with consultants, evaluation of patient's response to treatment, examination of patient, obtaining history from patient or surrogate, ordering and performing treatments and interventions, ordering and review of laboratory studies, ordering and review of radiographic studies, pulse oximetry and re-evaluation of patient's condition.  Medications Ordered in ED Medications  aspirin chewable tablet 324 mg (not administered)  nitroGLYCERIN 50 mg in dextrose 5 % 250 mL (0.2 mg/mL) infusion (not administered)     Initial Impression / Assessment and Plan / ED Course  I have reviewed the triage vital signs and the nursing notes.  Pertinent labs & imaging results that were available during my care of the patient were reviewed by me and considered in my medical decision making (see chart for details).  Patient with known coronary artery disease and history of suggestive of unstable angina. ECG shows no acute changes and troponin is normal. Old records are reviewed confirming myocardial infarction with the placement of 2 stents in 2013, and then known history of hypertensive cardiomyopathy. He is started on heparin and nitroglycerin drips and given aspirin. Case is discussed with Dr. Blaine Hamper of triad hospitalists who agrees to admit the patient.  Final Clinical Impressions(s) / ED  Diagnoses   Final diagnoses:  Unstable angina ([place])  Renal insufficiency  Hypertrophic cardiomyopathy ([place])    New Prescriptions New Prescriptions   No medications on file   I personally performed the services described in this documentation, which was scribed in my presence. The recorded information has been reviewed and is accurate.       Delora Fuel, MD 17/51/02 [id number]

## 2017-05-16 NOTE — H&P (View-Only) (Signed)
Cardiology Consult    Patient ID: Brandon Gonzales MRN: [id number], DOB/AGE: 1951/05/04   Admit date: 05/16/2017 Date of Consult: 05/16/2017  Primary Physician: [name], NP Reason for Consult: Chest Pain Primary Cardiologist: Dr. Martinique Requesting Provider: Dr. Candiss Norse   History of Present Illness    Brandon Gonzales is a 66 y.o. male with past medical history of CAD (s/p DES to LAD in 2007 with PTCA of D1, DES to LAD and LCx in 2013 in the setting of STEMI), hypertrophic cardiomyopathy, Stage 3 CKD, HTN, and HLD  who is being seen today for the evaluation of chest pain at the request of Dr. Candiss Norse.   He was evaluated in the ED on 02/19/2017 for chest pain. Troponin values were negative and EKG showed no acute changes, therefore he was discharged with close Cardiology follow-up arranged. He was examined by Dr. Martinique on 3/21 and a nuclear stress test was recommended which showed a small non-reversible perfusion defect in the basal and mid inferolateral walls consistent with prior infarct and ischemia. Overall, the study was low-risk and continued medical management was recommended.   He presented to [facility] ED on 05/16/2017 for evaluation of worsening chest pain and dyspnea on exertion for the past few weeks. In talking with the patient, he reports having worsening dyspnea on exertion and fatigue for the past several months with associated dizziness and pain radiating to his neck. Reports his symptoms are identical to what he experienced in 2013 at the time of his STEMI. He had called the office earlier this week and was scheduled as an add-on visit with Dr. Martinique tomorrow afternoon but he developed chest discomfort upon getting in bed last night, therefore he came to the ED for immediate evaluation. He describes the discomfort as a tightness radiating across his chest. Was started on Heparin and IV NTG with resolution of his chest pain.   Initial labs show WBC of 7.4, Hgb 16.4,  platelets 185. Na+ 140, K+ 4.2, creatinine 1.28 (close to baseline). D-dimer negative. Initial troponin 0.03. EKG shows NSR, HR 88, with RBBB and LAFB.    Past Medical History   Past Medical History:  Diagnosis Date  . Arthritis   . Cancer (HCC)    SKIN CA  SQUAMOUS CELL  . Coronary artery disease    a. 2007 Stent to LAD, PTCA D1;  b. DES to LAD & LCx 10/13 in the setting of STEMI.  [name] Kitchen Dyslipidemia    a. Statin and zetia-intolerant. On niacin.  [name] Kitchen GERD (gastroesophageal reflux disease)   . Heart murmur   . Hypertension   . Hypertrophic cardiomyopathy ([place])    a. 09/2012 Echo: EF 55%, mid to apical anterolateral, posterior, apical anterior, apical HK, Gr1 DD, turbulence across LVOT suggesting degree of obstruction, mild MR, mildly dil LA.    Past Surgical History:  Procedure Laterality Date  . CARDIAC CATHETERIZATION  2007   stent to LAD and PTCA of diagonal branch  . CARDIAC CATHETERIZATION  2010  . CORONARY ANGIOPLASTY WITH STENT PLACEMENT  2013   95-99% prox LAD, patent LAD stent distal to this, occluded mid-dist LCx, mild <10% RCA irreg; s/p DES-prox LAD & DES to mid-dist LCx  . [place]   right  . [place]   left  . LEFT HEART CATH Bilateral 09/22/2012   Procedure: LEFT HEART CATH;  Surgeon: Peter M Martinique, MD;  Location: [facility] C[facility] LAB;  Service: Cardiovascular;  Laterality:  Bilateral;  . PERCUTANEOUS CORONARY STENT INTERVENTION (PCI-S) Right 09/22/2012   Procedure: PERCUTANEOUS CORONARY STENT INTERVENTION (PCI-S);  Surgeon: Peter M Martinique, MD;  Location: [facility] CATH LAB;  Service: Cardiovascular;  Laterality: Right;     Allergies  Allergies  Allergen Reactions  . Antihistamines, Chlorpheniramine-Type Other (See Comments)    Altered mental status  . Pheniramine Other (See Comments)    Altered mental status  . Statins Other (See Comments)    Leg cramps    . Zetia [Ezetimibe] Other (See Comments)    Leg cramps    Inpatient Medications    . amLODipine   5 mg Oral Daily  . aspirin EC  325 mg Oral Daily  . lisinopril  20 mg Oral Daily  . niacin  1,000 mg Oral QHS  . [START ON 05/17/2017] pneumococcal 23 valent vaccine  0.5 mL Intramuscular Tomorrow-1000    Family History    Family History  Problem Relation Age of Onset  . Heart attack Mother 66  . Heart disease Mother     Social History    Social History   Social History  . Marital status: Married    Spouse name: N/A  . Number of children: N/A  . Years of education: N/A   Occupational History  . Not on file.   Social History Main Topics  . Smoking status: Never Smoker  . Smokeless tobacco: Never Used  . Alcohol use No  . Drug use: No  . Sexual activity: Not Currently   Other Topics Concern  . Not on file   Social History Narrative  . No narrative on file     Review of Systems    General:  No chills, fever, night sweats or weight changes.  Cardiovascular:  No edema, orthopnea, palpitations, paroxysmal nocturnal dyspnea. Positive for chest pain and dyspnea on exertion.  Dermatological: No rash, lesions/masses Respiratory: No cough, dyspnea Urologic: No hematuria, dysuria Abdominal:   No nausea, vomiting, diarrhea, bright red blood per rectum, melena, or hematemesis Neurologic:  No visual changes, wkns, changes in mental status. All other systems reviewed and are otherwise negative except as noted above.  Physical Exam    Blood pressure (!) 180/97, pulse 72, temperature 97.6 F (36.4 C), temperature source Oral, resp. rate 15, height 5\' 8"  (1.727 m), weight 170 lb (77.1 kg), SpO2 98 %.  General: Pleasant, Caucasian male appearing in NAD Psych: Normal affect. Neuro: Alert and oriented X 3. Moves all extremities spontaneously. HEENT: Normal  Neck: Supple without bruits or JVD. Lungs:  Resp regular and unlabored, CTA without wheezing or rales. Heart: RRR no s3, s4, or murmurs. Abdomen: Soft, non-tender, non-distended, BS + x 4.  Extremities: No clubbing,  cyanosis or edema. DP/PT/Radials 2+ and equal bilaterally.  Labs    Troponin [facility] Test)  Recent Labs  05/16/17 0014  TROPIPOC 0.02    Recent Labs  05/16/17 0259  TROPONINI 0.03*   Lab Results  Component Value Date   WBC 7.4 05/16/2017   HGB 16.4 05/16/2017   HCT 47.9 05/16/2017   MCV 90.9 05/16/2017   PLT 185 05/16/2017    Recent Labs Lab 05/16/17 0002  NA 140  K 4.2  CL 101  CO2 30  BUN 15  CREATININE 1.28*  CALCIUM 10.0  GLUCOSE 101*   Lab Results  Component Value Date   CHOL 163 05/16/2017   HDL 28 (L) 05/16/2017   LDLCALC 81 05/16/2017   TRIG 269 (H) 05/16/2017   Lab  Results  Component Value Date   DDIMER 0.28 05/16/2017     Radiology Studies    Dg Chest 2 View  Result Date: 05/16/2017 CLINICAL DATA:  Chest pain and shortness of breath EXAM: CHEST  2 VIEW COMPARISON:  02/19/2017 FINDINGS: Minimal atelectasis at the right base. No focal consolidation or effusion. Heart size upper limits of normal. No pneumothorax. IMPRESSION: No active cardiopulmonary disease. Electronically Signed   By: Donavan Foil M.D.   On: 05/16/2017 01:22    EKG & Cardiac Imaging    EKG: NSR, HR 88, with RBBB and LAFB. - Personally Reviewed  Echocardiogram: 03/09/2017 Study Conclusions  - Left ventricle: The cavity size was normal. Wall thickness was   increased in a pattern of moderate LVH. There was severe focal   basal hypertrophy of the septum consistent with HOCM. Mean LVOT   gradient at rest is 5 mmHg, valsalva was not performed. Systolic   function was normal. The estimated ejection fraction was in the   range of 60% to 65%. Wall motion was normal; there were no   regional wall motion abnormalities. Doppler parameters are   consistent with abnormal left ventricular relaxation (grade 1   diastolic dysfunction). The E/e&' ratio is between 8-15,   suggesting indeterminate LV filling pressure. - Aortic valve: Trileaflet; mildly calcified leaflets. There  was no   stenosis. There was trivial regurgitation. Mean gradient (S): 11   mm Hg. - Mitral valve: SAM noted. There is mild regurgitation. - Left atrium: The atrium was mildly dilated. - Inferior vena cava: The vessel was normal in size. The   respirophasic diameter changes were in the normal range (>= 50%),   consistent with normal central venous pressure.  Impressions:  - LVEF 60-65%, moderate LVH, severe focal basal septal hypertrophy   (IVSd 1.9 cm), 5 mmHg LVOT rest gradient - SAM noted, valsalva   was not performed, grade 1 DD, indeterminate LV filling pressure,   trivial AI, mild MR, mild LAE, normal IVC.  NST: 03/02/2017  The left ventricular ejection fraction is mildly decreased (45-54%).  Nuclear stress EF: 51%.  Blood pressure demonstrated a hypotensive response to exercise.  There was no ST segment deviation noted during stress.  Defect 1: There is a small defect of moderate severity present in the basal inferolateral and mid inferolateral location.  This is a low risk study.   There is a small non-reversible perfusion defect in the basal and mid inferolateral walls consistent with prior infarct and ischemia.  There is hypokinesis in the  basal and mid inferolateral walls.  Assessment & Plan    1. Unstable Angina/ CAD - he has a known history of CAD, s/p DES to LAD in 2007 with PTCA of D1 and DES to LAD and LCx in 2013 in the setting of an anterior STEMI. - he reports experiencing worsening dyspnea on exertion and fatigue for the past several months with associated dizziness and pain radiating to his neck which is identical to what he experienced in 2013 at the time of his STEMI. He developed chest discomfort upon getting in bed last night, therefore he came to the ED for immediate evaluation. Was started on Heparin and IV NTG with  - recent NST in 02/2017 showed a small non-reversible perfusion defect in the basal and mid inferolateral walls consistent with prior  infarct and ischemia. Overall, the study was low-risk and continued medical management was recommended.  - D-dimer negative. Initial troponin 0.03. EKG shows NSR, HR  88, with RBBB and LAFB.   - overall, his symptoms seem very concerning for unstable angina and a definitive evaluation is warranted. Discussed with Dr. Claiborne Billings. Will plan for a cardiac catheterization later this afternoon. To remain NPO until after the procedure.  - continue ASA and Heparin. Unclear why he is not on BB therapy.   2. HOCM - he appears euvolemic on examination. - continue BP management with Amlodipine and Lisinopril.   3. HTN - BP remains well-controlled.  - continue PTA medication regimen (already given Lisinopril this AM but creatinine remains stable).   4. HLD - Lipid Panel shows total cholesterol 163, HDL 28, LDL 81. - is participating in the CLEAR study.   5. Stage 3 CKD - creatinine is stable at 1.28 (close to baseline). - repeat BMET in AM following his cardiac catheterization.    Signed, Erma Heritage, PA-C 05/16/2017, 8:43 AM Pager: [phone]  Patient seen and examined. Agree with assessment and plan.  Brandon Gonzales is a 66 year old Caucasian male who has known CAD and is status post numerous interventions with DES stenting to his LAD in 2007, PTCA of his first diagonal, DES stent to the LAD and circumflex in 2013 in the setting of an ST segment elevation MI.  He also has hypertrophic cardiomyopathy, stage III chronic kidney disease, hypertension, and apparently has been intolerant to statins with hyperlipidemia.  He was omitted yesterday evening after he was experiencing chest fullness when going to bed.  Early in the day, the patient had attended a funeral in [place] and developed significant exertional shortness of breath with chest pressure while walking up a hill and had a stop half way.  In retrospect, he also admits to several weeks of more exertional dyspnea.  He was scheduled to see Dr.  Martinique tomorrow for anticipated cardiac catheterization.  He now presents with unstable angina.  ECG is nondiagnostic and shows sinus rhythm at 88 with right bundle branch block and repolarization changes as well as left anterior hemiblock.  His initial troponin is negative.  D-dimer is negative.  He has stable renal function with a creatinine of 1.28.  He has noticed some mild residual chest chest pressure.  His blood pressure was initially elevated and is now improved at 146/89.  HEENT is unremarkable.  He did not have carotid bruits.  Lungs were clear.  Rhythm was regular.  There was a 1-6/0 systolic murmur compatible with his hypertrophic cardiomyopathy, no S3 gallop.  Abdomen is soft and nontender.  Pulses are 2+.  There was no clubbing cyanosis or edema.  Neurologic exam was nonfocal.  On his last echo Doppler study of April 2018 EF was 60-65%.  Normal wall motion and grade 1 diastolic dysfunction.  A mean gradient of 11 was noted in the aortic valve and there was no mention that this was subvalvular.  There was evidence for systolic anterior motion of the mitral valve with mild MR.  My impression is that the patient's symptoms worrisome for progressive unstable angina pectoris.  I have recommended definitive cardiac catheterization and will plan to continue IV nitroglycerin, IV heparin, and schedule for cardiac catheterization to be done today as the schedule allows. I have reviewed the risks, indications, and alternatives to cardiac catheterization, possible angioplasty, and stenting with the patient. Risks include but are not limited to bleeding, infection, vascular injury, stroke, myocardial infection, arrhythmia, kidney injury, radiation-related injury in the case of prolonged fluoroscopy use, emergency cardiac surgery, and death. The patient understands the  risks of serious complication is 1-2 in 8315 with diagnostic cardiac cath and 1-2% or less with angioplasty/stenting.    Troy Sine, MD,  [facility] 05/16/2017 10:05 AM

## 2017-05-16 NOTE — Progress Notes (Signed)
Able to perform all ADL's a little short of breath when climbing stairs

## 2017-05-16 NOTE — Progress Notes (Addendum)
ANTICOAGULATION CONSULT NOTE - Initial Consult  Pharmacy Consult for Heparin Indication: chest pain/ACS  Allergies  Allergen Reactions  . Antihistamines, Chlorpheniramine-Type Other (See Comments)    Altered mental status  . Pheniramine Other (See Comments)    Altered mental status  . Statins Other (See Comments)    Leg cramps    . Zetia [Ezetimibe] Other (See Comments)    Leg cramps    Patient Measurements: Height: 5\' 8"  (172.7 cm) Weight: 170 lb (77.1 kg) IBW/kg (Calculated) : 68.4 Heparin Dosing Weight: 77 kg  Vital Signs: Temp: 98 F (36.7 C) (06/13 0004) Temp Source: Oral (06/13 0004) BP: 136/83 (06/13 0230) Pulse Rate: 57 (06/13 0230)  Labs:  Recent Labs  05/16/17 0002  HGB 16.4  HCT 47.9  PLT 185  CREATININE 1.28*    Estimated Creatinine Clearance: 53.4 mL/min (A) (by C-G formula based on SCr of 1.28 mg/dL (H)).   Medical History: Past Medical History:  Diagnosis Date  . Arthritis   . Cancer (HCC)    SKIN CA  SQUAMOUS CELL  . Coronary artery disease    a. 2007 Stent to LAD, PTCA D1;  b. DES to LAD & LCx 10/13 in the setting of STEMI.  [name] Kitchen Dyslipidemia    a. Statin and zetia-intolerant. On niacin.  [name] Kitchen GERD (gastroesophageal reflux disease)   . Heart murmur   . Hypertension   . Hypertrophic cardiomyopathy ([place])    a. 09/2012 Echo: EF 55%, mid to apical anterolateral, posterior, apical anterior, apical HK, Gr1 DD, turbulence across LVOT suggesting degree of obstruction, mild MR, mildly dil LA.    Medications:  See electronic med rec  Assessment: 66 y.o. M presents with CP. To begin heparin for r/o ACS. No AC PTA. CBC ok on admission.  Goal of Therapy:  Heparin level 0.3-0.7 units/ml Monitor platelets by anticoagulation protocol: Yes   Plan:  Heparin IV bolus 4000 units Heparin gtt at 1050 units/hr Will f/u heparin level in 6 hours Daily heparin level and CBC  Sherlon Handing, PharmD, BCPS Clinical pharmacist, pager [phone] 05/16/2017,2:42 AM    Addendum [phone]): **Admitting MD d/c heparin before it was initiated.**  Addendum ([id number]): New orders to restart heparin per pharmacy. Will follow plan above.  Sherlon Handing, PharmD, BCPS Clinical pharmacist, pager [phone] 05/16/2017 6:10 AM

## 2017-05-16 NOTE — Progress Notes (Signed)
Patient states he has to go to bathroom, insisting that he cannot go anywhere except bathroom. NP paged and order received. Patient walked to BR monitored with no assistance needed steady gait. Educated on ringing for assistance before ambulating back, bleeding risk. Instructed on obtaining urine sample

## 2017-05-16 NOTE — Progress Notes (Signed)
@IPLOG         PROGRESS NOTE                                                                                                                                                                                                             Patient Demographics:    Brandon Gonzales, is a 66 y.o. male, DOB - [birth year], EAV:[id number]  Admit date - 05/16/2017   Admitting Physician Ivor Costa, MD  Outpatient Primary MD for the patient is Danford, Berna Spare, NP  LOS - 0  Chief Complaint  Patient presents with  . Chest Pain       Brief Narrative    Brandon Gonzales is a 66 y.o. male with medical history significant of hypertension, hyperlipidemia, GERD, CAD, STEMI, s/p of stent placement,  chronic kidney disease-stage III, hypertrophic cardiomyopathy, who presents with chest pain and shortness of breath.  Patient states that he has been having intermittent chest discomfort and shortness of breath since January. It is associated with lightheadedness. Since last night, patient developed chest pain, which is located in the left side of chest, pressure-like pain, 6 out of 10 in severity, radiating to the left arm and dry. He has exertional SOB. His chest pain is pleuritic, aggravated by deep breath. Denies tenderness in the calf area. No cough, fever or chills. Patient denies nausea, vomiting, diarrhea, abdominal pain. No unilateral weakness   Subjective:    Brandon Gonzales today has, No headache, +ve L sides chest pressure, No abdominal pain - No Nausea, No new weakness tingling or numbness, No Cough - SOB.     Assessment  & Plan :     1. Chest pain in a patient with history of CAD and multiple stents in the past. History concerning for unstable angina, currently on IV nitroglycerin and heparin drip, continue aspirin along with when necessary morphine, he is allergic to statin anxiety, have added Coreg, have called cardio to evaluate, likely will require left heart catheterization.  2. Dyslipidemia.  Allergic to statin and Zetia, defer management to cardiology.  3. Hypertension. On lisinopril have stopped Norvasc and placed him on Coreg instead.  4. CKD3 baseline creatinine of around 1.2-1.3, he is at baseline.    Diet : Diet NPO time specified Except for: Ice Chips, Sips with Meds    Family Communication  : None  Code Status :  Full  Disposition Plan  :  TBD  Consults  :  Cards  Procedures  :    DVT Prophylaxis  :  Heparin   Lab Results  Component Value Date   PLT 185 05/16/2017    Inpatient Medications  Scheduled Meds: . aspirin EC  325 mg Oral Daily  . carvedilol  3.125 mg Oral BID WC  . lisinopril  20 mg Oral Daily  . niacin  1,000 mg Oral QHS  . [START ON 05/17/2017] pneumococcal 23 valent vaccine  0.5 mL Intramuscular Tomorrow-1000   Continuous Infusions: . sodium chloride 100 mL/hr at 05/16/17 0642  . heparin 1,050 Units/hr (05/16/17 0642)  . nitroGLYCERIN 10 mcg/min (05/16/17 0600)   PRN Meds:.acetaminophen, aspirin-acetaminophen-caffeine, calcium carbonate, hydrALAZINE, morphine injection, ondansetron (ZOFRAN) IV, zolpidem  Antibiotics  :    Anti-infectives    None         Objective:   Vitals:   05/16/17 0345 05/16/17 0430 05/16/17 0650 05/16/17 0929  BP: 140/85 (!) 180/97  (!) 146/89  Pulse: (!) 57 63 72   Resp: 11 13 15    Temp:  97.6 F (36.4 C)    TempSrc:  Oral    SpO2: 96% 96% 98%   Weight:      Height:        Wt Readings from Last 3 Encounters:  05/16/17 77.1 kg (170 lb)  04/18/17 78 kg (172 lb)  03/02/17 78 kg (172 lb)    No intake or output data in the 24 hours ending 05/16/17 1111   Physical Exam  Awake Alert, Oriented X 3, No new F.N deficits, Normal affect [place].AT,PERRAL Supple Neck,No JVD, No cervical lymphadenopathy appriciated.  Symmetrical Chest wall movement, Good air movement bilaterally, CTAB RRR,No Gallops,Rubs or new Murmurs, No Parasternal Heave +ve B.Sounds, Abd Soft, No tenderness, No organomegaly  appriciated, No rebound - guarding or rigidity. No Cyanosis, Clubbing or edema, No new Rash or bruise       Data Review:    CBC  Recent Labs Lab 05/16/17 0002  WBC 7.4  HGB 16.4  HCT 47.9  PLT 185  MCV 90.9  MCH 31.1  MCHC 34.2  RDW 12.9    Chemistries   Recent Labs Lab 05/16/17 0002  NA 140  K 4.2  CL 101  CO2 30  GLUCOSE 101*  BUN 15  CREATININE 1.28*  CALCIUM 10.0   ------------------------------------------------------------------------------------------------------------------  Recent Labs  05/16/17 0337  CHOL 163  HDL 28*  LDLCALC 81  TRIG 269*  CHOLHDL 5.8    Lab Results  Component Value Date   HGBA1C 5.0 12/28/2016   ------------------------------------------------------------------------------------------------------------------ No results for input(s): TSH, T4TOTAL, T3FREE, THYROIDAB in the last 72 hours.  Invalid input(s): FREET3 ------------------------------------------------------------------------------------------------------------------ No results for input(s): VITAMINB12, FOLATE, FERRITIN, TIBC, IRON, RETICCTPCT in the last 72 hours.  Coagulation profile No results for input(s): INR, PROTIME in the last 168 hours.   Recent Labs  05/16/17 0337  DDIMER 0.28    Cardiac Enzymes  Recent Labs Lab 05/16/17 0259 05/16/17 0759  TROPONINI 0.03* <0.03   ------------------------------------------------------------------------------------------------------------------    Component Value Date/Time   BNP 50.0 02/19/2017 1911    Micro Results Recent Results (from the past 240 hour(s))  MRSA PCR Screening     Status: None   Collection Time: 05/16/17  5:44 AM  Result Value Ref Range Status   MRSA by PCR NEGATIVE NEGATIVE Final    Comment:        The GeneXpert MRSA Assay (FDA approved for NASAL specimens only), is one component of a comprehensive MRSA colonization surveillance program. It is not intended to diagnose  MRSA infection nor to  guide or monitor treatment for MRSA infections.     Radiology Reports Dg Chest 2 View  Result Date: 05/16/2017 CLINICAL DATA:  Chest pain and shortness of breath EXAM: CHEST  2 VIEW COMPARISON:  02/19/2017 FINDINGS: Minimal atelectasis at the right base. No focal consolidation or effusion. Heart size upper limits of normal. No pneumothorax. IMPRESSION: No active cardiopulmonary disease. Electronically Signed   By: Donavan Foil M.D.   On: 05/16/2017 01:22    Time Spent in minutes  30   Brandon Gonzales M.D on 05/16/2017 at 11:11 AM  Between 7am to 7pm - Pager - [phone] ( page via [email or web address], text pages only, please mention full 10 digit call back number). After 7pm go to [email or web address] - password [email or web address]

## 2017-05-16 NOTE — ED Triage Notes (Signed)
Pt has had ongoing chest pain since January with stress tests and echocardiograms done. Pt reports worsening chest heaviness for the past two weeks. Hx of MI . Reports pain to L side of chest radiating into L arm and jaw.

## 2017-05-16 NOTE — Interval H&P Note (Signed)
History and Physical Interval Note:  05/16/2017 3:39 PM  Kelle Darting Riggle  has presented today for surgery, with the diagnosis of cp - concerning for progressive angina. The various methods of treatment have been discussed with the patient and family. After consideration of risks, benefits and other options for treatment, the patient has consented to  Procedure(s): Left Heart Cath and Coronary Angiography (N/A) with possible Percutaneous Coronary Intervention as a surgical intervention .  The patient's history has been reviewed, patient examined, no change in status, stable for surgery.  I have reviewed the patient's chart and labs.  Questions were answered to the patient's satisfaction.    Cath Lab Visit (complete for each Cath Lab visit)  Clinical Evaluation Leading to the Procedure:   ACS: No.  Non-ACS:    Anginal Classification: CCS III  Anti-ischemic medical therapy: Minimal Therapy (1 class of medications)  Non-Invasive Test Results: No non-invasive testing performed  Prior CABG: No previous CABG   Glenetta Hew

## 2017-05-16 NOTE — Progress Notes (Signed)
Admitted to rm 13 on 4 N denies chest pain at this time.  HR 70's sinus. Moving well in bed. History obtained and assessment as noted. Wife at bedside both are requesting to get some sleep. New 2nd IV started in left arm with good blood return. Flushed.

## 2017-05-16 NOTE — Consult Note (Signed)
Cardiology Consult    Patient ID: Brandon Gonzales MRN: [id number], DOB/AGE: [birth year]   Admit date: 05/16/2017 Date of Consult: 05/16/2017  Primary Physician: [name], NP Reason for Consult: Chest Pain Primary Cardiologist: Dr. Martinique Requesting Provider: Dr. Candiss Norse   History of Present Illness    Brandon Gonzales is a 66 y.o. male with past medical history of CAD (s/p DES to LAD in 2007 with PTCA of D1, DES to LAD and LCx in 2013 in the setting of STEMI), hypertrophic cardiomyopathy, Stage 3 CKD, HTN, and HLD  who is being seen today for the evaluation of chest pain at the request of Dr. Candiss Norse.   He was evaluated in the ED on 02/19/2017 for chest pain. Troponin values were negative and EKG showed no acute changes, therefore he was discharged with close Cardiology follow-up arranged. He was examined by Dr. Martinique on 3/21 and a nuclear stress test was recommended which showed a small non-reversible perfusion defect in the basal and mid inferolateral walls consistent with prior infarct and ischemia. Overall, the study was low-risk and continued medical management was recommended.   He presented to [facility] ED on 05/16/2017 for evaluation of worsening chest pain and dyspnea on exertion for the past few weeks. In talking with the patient, he reports having worsening dyspnea on exertion and fatigue for the past several months with associated dizziness and pain radiating to his neck. Reports his symptoms are identical to what he experienced in 2013 at the time of his STEMI. He had called the office earlier this week and was scheduled as an add-on visit with Dr. Martinique tomorrow afternoon but he developed chest discomfort upon getting in bed last night, therefore he came to the ED for immediate evaluation. He describes the discomfort as a tightness radiating across his chest. Was started on Heparin and IV NTG with resolution of his chest pain.   Initial labs show WBC of 7.4, Hgb 16.4,  platelets 185. Na+ 140, K+ 4.2, creatinine 1.28 (close to baseline). D-dimer negative. Initial troponin 0.03. EKG shows NSR, HR 88, with RBBB and LAFB.    Past Medical History   Past Medical History:  Diagnosis Date  . Arthritis   . Cancer (HCC)    SKIN CA  SQUAMOUS CELL  . Coronary artery disease    a. 2007 Stent to LAD, PTCA D1;  b. DES to LAD & LCx 10/13 in the setting of STEMI.  [name] Kitchen Dyslipidemia    a. Statin and zetia-intolerant. On niacin.  [name] Kitchen GERD (gastroesophageal reflux disease)   . Heart murmur   . Hypertension   . Hypertrophic cardiomyopathy ([place])    a. 09/2012 Echo: EF 55%, mid to apical anterolateral, posterior, apical anterior, apical HK, Gr1 DD, turbulence across LVOT suggesting degree of obstruction, mild MR, mildly dil LA.    Past Surgical History:  Procedure Laterality Date  . CARDIAC CATHETERIZATION  2007   stent to LAD and PTCA of diagonal branch  . CARDIAC CATHETERIZATION  2010  . CORONARY ANGIOPLASTY WITH STENT PLACEMENT  2013   95-99% prox LAD, patent LAD stent distal to this, occluded mid-dist LCx, mild <10% RCA irreg; s/p DES-prox LAD & DES to mid-dist LCx  . [place]   right  . [place]   left  . LEFT HEART CATH Bilateral 09/22/2012   Procedure: LEFT HEART CATH;  Surgeon: Peter M Martinique, MD;  Location: [facility] CATH LAB;  Service: Cardiovascular;  Laterality:  Bilateral;  . PERCUTANEOUS CORONARY STENT INTERVENTION (PCI-S) Right 09/22/2012   Procedure: PERCUTANEOUS CORONARY STENT INTERVENTION (PCI-S);  Surgeon: Peter M Martinique, MD;  Location[facility] CATH LAB;  Service: Cardiovascular;  Laterality: Right;     Allergies  Allergies  Allergen Reactions  . Antihistamines, Chlorpheniramine-Type Other (See Comments)    Altered mental status  . Pheniramine Other (See Comments)    Altered mental status  . Statins Other (See Comments)    Leg cramps    . Zetia [Ezetimibe] Other (See Comments)    Leg cramps    Inpatient Medications    . amLODipine   5 mg Oral Daily  . aspirin EC  325 mg Oral Daily  . lisinopril  20 mg Oral Daily  . niacin  1,000 mg Oral QHS  . [START ON 05/17/2017] pneumococcal 23 valent vaccine  0.5 mL Intramuscular Tomorrow-1000    Family History    Family History  Problem Relation Age of Onset  . Heart attack Mother 71  . Heart disease Mother     Social History    Social History   Social History  . Marital status: Married    Spouse name: N/A  . Number of children: N/A  . Years of education: N/A   Occupational History  . Not on file.   Social History Main Topics  . Smoking status: Never Smoker  . Smokeless tobacco: Never Used  . Alcohol use No  . Drug use: No  . Sexual activity: Not Currently   Other Topics Concern  . Not on file   Social History Narrative  . No narrative on file     Review of Systems    General:  No chills, fever, night sweats or weight changes.  Cardiovascular:  No edema, orthopnea, palpitations, paroxysmal nocturnal dyspnea. Positive for chest pain and dyspnea on exertion.  Dermatological: No rash, lesions/masses Respiratory: No cough, dyspnea Urologic: No hematuria, dysuria Abdominal:   No nausea, vomiting, diarrhea, bright red blood per rectum, melena, or hematemesis Neurologic:  No visual changes, wkns, changes in mental status. All other systems reviewed and are otherwise negative except as noted above.  Physical Exam    Blood pressure (!) 180/97, pulse 72, temperature 97.6 F (36.4 C), temperature source Oral, resp. rate 15, height 5\' 8"  (1.727 m), weight 170 lb (77.1 kg), SpO2 98 %.  General: Pleasant, Caucasian male appearing in NAD Psych: Normal affect. Neuro: Alert and oriented X 3. Moves all extremities spontaneously. HEENT: Normal  Neck: Supple without bruits or JVD. Lungs:  Resp regular and unlabored, CTA without wheezing or rales. Heart: RRR no s3, s4, or murmurs. Abdomen: Soft, non-tender, non-distended, BS + x 4.  Extremities: No clubbing,  cyanosis or edema. DP/PT/Radials 2+ and equal bilaterally.  Labs    Troponin [facility] of Care Test)  Recent Labs  05/16/17 0014  TROPIPOC 0.02    Recent Labs  05/16/17 0259  TROPONINI 0.03*   Lab Results  Component Value Date   WBC 7.4 05/16/2017   HGB 16.4 05/16/2017   HCT 47.9 05/16/2017   MCV 90.9 05/16/2017   PLT 185 05/16/2017    Recent Labs Lab 05/16/17 0002  NA 140  K 4.2  CL 101  CO2 30  BUN 15  CREATININE 1.28*  CALCIUM 10.0  GLUCOSE 101*   Lab Results  Component Value Date   CHOL 163 05/16/2017   HDL 28 (L) 05/16/2017   LDLCALC 81 05/16/2017   TRIG 269 (H) 05/16/2017   Lab  Results  Component Value Date   DDIMER 0.28 05/16/2017     Radiology Studies    Dg Chest 2 View  Result Date: 05/16/2017 CLINICAL DATA:  Chest pain and shortness of breath EXAM: CHEST  2 VIEW COMPARISON:  02/19/2017 FINDINGS: Minimal atelectasis at the right base. No focal consolidation or effusion. Heart size upper limits of normal. No pneumothorax. IMPRESSION: No active cardiopulmonary disease. Electronically Signed   By: Donavan Foil M.D.   On: 05/16/2017 01:22    EKG & Cardiac Imaging    EKG: NSR, HR 88, with RBBB and LAFB. - Personally Reviewed  Echocardiogram: 03/09/2017 Study Conclusions  - Left ventricle: The cavity size was normal. Wall thickness was   increased in a pattern of moderate LVH. There was severe focal   basal hypertrophy of the septum consistent with HOCM. Mean LVOT   gradient at rest is 5 mmHg, valsalva was not performed. Systolic   function was normal. The estimated ejection fraction was in the   range of 60% to 65%. Wall motion was normal; there were no   regional wall motion abnormalities. Doppler parameters are   consistent with abnormal left ventricular relaxation (grade 1   diastolic dysfunction). The E/e&' ratio is between 8-15,   suggesting indeterminate LV filling pressure. - Aortic valve: Trileaflet; mildly calcified leaflets. There  was no   stenosis. There was trivial regurgitation. Mean gradient (S): 11   mm Hg. - Mitral valve: SAM noted. There is mild regurgitation. - Left atrium: The atrium was mildly dilated. - Inferior vena cava: The vessel was normal in size. The   respirophasic diameter changes were in the normal range (>= 50%),   consistent with normal central venous pressure.  Impressions:  - LVEF 60-65%, moderate LVH, severe focal basal septal hypertrophy   (IVSd 1.9 cm), 5 mmHg LVOT rest gradient - SAM noted, valsalva   was not performed, grade 1 DD, indeterminate LV filling pressure,   trivial AI, mild MR, mild LAE, normal IVC.  NST: 03/02/2017  The left ventricular ejection fraction is mildly decreased (45-54%).  Nuclear stress EF: 51%.  Blood pressure demonstrated a hypotensive response to exercise.  There was no ST segment deviation noted during stress.  Defect 1: There is a small defect of moderate severity present in the basal inferolateral and mid inferolateral location.  This is a low risk study.   There is a small non-reversible perfusion defect in the basal and mid inferolateral walls consistent with prior infarct and ischemia.  There is hypokinesis in the  basal and mid inferolateral walls.  Assessment & Plan    1. Unstable Angina/ CAD - he has a known history of CAD, s/p DES to LAD in 2007 with PTCA of D1 and DES to LAD and LCx in 2013 in the setting of an anterior STEMI. - he reports experiencing worsening dyspnea on exertion and fatigue for the past several months with associated dizziness and pain radiating to his neck which is identical to what he experienced in 2013 at the time of his STEMI. He developed chest discomfort upon getting in bed last night, therefore he came to the ED for immediate evaluation. Was started on Heparin and IV NTG with  - recent NST in 02/2017 showed a small non-reversible perfusion defect in the basal and mid inferolateral walls consistent with prior  infarct and ischemia. Overall, the study was low-risk and continued medical management was recommended.  - D-dimer negative. Initial troponin 0.03. EKG shows NSR, HR  88, with RBBB and LAFB.   - overall, his symptoms seem very concerning for unstable angina and a definitive evaluation is warranted. Discussed with Dr. Claiborne Billings. Will plan for a cardiac catheterization later this afternoon. To remain NPO until after the procedure.  - continue ASA and Heparin. Unclear why he is not on BB therapy.   2. HOCM - he appears euvolemic on examination. - continue BP management with Amlodipine and Lisinopril.   3. HTN - BP remains well-controlled.  - continue PTA medication regimen (already given Lisinopril this AM but creatinine remains stable).   4. HLD - Lipid Panel shows total cholesterol 163, HDL 28, LDL 81. - is participating in the CLEAR study.   5. Stage 3 CKD - creatinine is stable at 1.28 (close to baseline). - repeat BMET in AM following his cardiac catheterization.    Signed, Erma Heritage, PA-C 05/16/2017, 8:43 AM Pager: [phone]  Patient seen and examined. Agree with assessment and plan.  Mr. Brandon Gonzales is a 66 year old Caucasian male who has known CAD and is status post numerous interventions with DES stenting to his LAD in 2007, PTCA of his first diagonal, DES stent to the LAD and circumflex in 2013 in the setting of an ST segment elevation MI.  He also has hypertrophic cardiomyopathy, stage III chronic kidney disease, hypertension, and apparently has been intolerant to statins with hyperlipidemia.  He was omitted yesterday evening after he was experiencing chest fullness when going to bed.  Early in the day, the patient had attended a funeral in [place] and developed significant exertional shortness of breath with chest pressure while walking up a hill and had a stop half way.  In retrospect, he also admits to several weeks of more exertional dyspnea.  He was scheduled to see Dr.  Martinique tomorrow for anticipated cardiac catheterization.  He now presents with unstable angina.  ECG is nondiagnostic and shows sinus rhythm at 88 with right bundle branch block and repolarization changes as well as left anterior hemiblock.  His initial troponin is negative.  D-dimer is negative.  He has stable renal function with a creatinine of 1.28.  He has noticed some mild residual chest chest pressure.  His blood pressure was initially elevated and is now improved at 146/89.  HEENT is unremarkable.  He did not have carotid bruits.  Lungs were clear.  Rhythm was regular.  There was a 9-0/2 systolic murmur compatible with his hypertrophic cardiomyopathy, no S3 gallop.  Abdomen is soft and nontender.  Pulses are 2+.  There was no clubbing cyanosis or edema.  Neurologic exam was nonfocal.  On his last echo Doppler study of April 2018 EF was 60-65%.  Normal wall motion and grade 1 diastolic dysfunction.  A mean gradient of 11 was noted in the aortic valve and there was no mention that this was subvalvular.  There was evidence for systolic anterior motion of the mitral valve with mild MR.  My impression is that the patient's symptoms worrisome for progressive unstable angina pectoris.  I have recommended definitive cardiac catheterization and will plan to continue IV nitroglycerin, IV heparin, and schedule for cardiac catheterization to be done today as the schedule allows. I have reviewed the risks, indications, and alternatives to cardiac catheterization, possible angioplasty, and stenting with the patient. Risks include but are not limited to bleeding, infection, vascular injury, stroke, myocardial infection, arrhythmia, kidney injury, radiation-related injury in the case of prolonged fluoroscopy use, emergency cardiac surgery, and death. The patient understands the  risks of serious complication is 1-2 in 8102 with diagnostic cardiac cath and 1-2% or less with angioplasty/stenting.    Troy Sine, MD,  [facility] 05/16/2017 10:05 AM

## 2017-05-16 NOTE — ED Notes (Signed)
Pt reports he recently stopped taking amlodipine this week after talking to his PCP due to bp running low

## 2017-05-16 NOTE — H&P (Addendum)
History and Physical    Brandon Gonzales ZHY:[id number] DOB: 13-[name]-1951 DOA: 05/16/2017  Referring MD/NP/PA:   PCP: [name], NP   Patient coming from:  The patient is coming from home.  At baseline, pt is independent for most of ADL.      Chief Complaint: chest pain and SOB  HPI: Brandon Gonzales is a 66 y.o. male with medical history significant of hypertension, hyperlipidemia, GERD, CAD, STEMI, s/p of stent placement,  chronic kidney disease-stage III, hypertrophic cardiomyopathy, who presents with chest pain and shortness of breath.  Patient states that he has been having intermittent chest discomfort and shortness of breath since January. It is associated with lightheadedness. Since last night, patient developed chest pain, which is located in the left side of chest, pressure-like pain, 6 out of 10 in severity, radiating to the left arm and dry. He has exertional SOB. His chest pain is pleuritic, aggravated by deep breath. Denies tenderness in the calf area. No cough, fever or chills. Patient denies nausea, vomiting, diarrhea, abdominal pain. No unilateral weakness.  ED Course: pt was found to have negative troponin, WBC 7.4, stable renal function, temperature normal, bradycardia, oxygen saturation 97% on room air, negative chest x-ray. Patient is placed on stepdown bed for observation.  Review of Systems:   General: no fevers, chills, no changes in body weight, has fatigue HEENT: no blurry vision, hearing changes or sore throat Respiratory: has dyspnea, no coughing, wheezing CV: has chest pain, no palpitations GI: no nausea, vomiting, abdominal pain, diarrhea, constipation GU: no dysuria, burning on urination, increased urinary frequency, hematuria  Ext: no leg edema Neuro: no unilateral weakness, numbness, or tingling, no vision change or hearing loss Skin: no rash, no skin tear. MSK: No muscle spasm, no deformity, no limitation of range of movement in spin Heme: No easy  bruising.  Travel history: No recent long distant travel.  Allergy:  Allergies  Allergen Reactions  . Antihistamines, Chlorpheniramine-Type Other (See Comments)    Altered mental status  . Pheniramine Other (See Comments)    Altered mental status  . Statins Other (See Comments)    Leg cramps    . Zetia [Ezetimibe] Other (See Comments)    Leg cramps    Past Medical History:  Diagnosis Date  . Arthritis   . Cancer (HCC)    SKIN CA  SQUAMOUS CELL  . Coronary artery disease    a. 2007 Stent to LAD, PTCA D1;  b. DES to LAD & LCx 10/13 in the setting of STEMI.  [name] Kitchen Dyslipidemia    a. Statin and zetia-intolerant. On niacin.  [name] Kitchen GERD (gastroesophageal reflux disease)   . Heart murmur   . Hypertension   . Hypertrophic cardiomyopathy ([place])    a. 09/2012 Echo: EF 55%, mid to apical anterolateral, posterior, apical anterior, apical HK, Gr1 DD, turbulence across LVOT suggesting degree of obstruction, mild MR, mildly dil LA.    Past Surgical History:  Procedure Laterality Date  . CARDIAC CATHETERIZATION  2007   stent to LAD and PTCA of diagonal branch  . CARDIAC CATHETERIZATION  2010  . CORONARY ANGIOPLASTY WITH STENT PLACEMENT  2013   95-99% prox LAD, patent LAD stent distal to this, occluded mid-dist LCx, mild <10% RCA irreg; s/p DES-prox LAD & DES to mid-dist LCx  . [place]   right  . [place]   left  . LEFT HEART CATH Bilateral 09/22/2012   Procedure: LEFT HEART CATH;  Surgeon:  Peter M Martinique, MD;  Location: [facility] CATH LAB;  Service: Cardiovascular;  Laterality: Bilateral;  . PERCUTANEOUS CORONARY STENT INTERVENTION (PCI-S) Right 09/22/2012   Procedure: PERCUTANEOUS CORONARY STENT INTERVENTION (PCI-S);  Surgeon: Peter M Martinique, MD;  Location: [facility] CATH LAB;  Service: Cardiovascular;  Laterality: Right;    Social History:  reports that he has never smoked. He has never used smokeless tobacco. He reports that he does not drink alcohol or use drugs.  Family  History:  Family History  Problem Relation Age of Onset  . Heart attack Mother 24  . Heart disease Mother      Prior to Admission medications   Medication Sig Start Date End Date Taking? Authorizing Provider  AMBULATORY NON FORMULARY MEDICATION Take 180 mg by mouth daily. Medication Name: bempedoic acid vs a placebo CLEAR Research Study drug provided 05/02/17   Lelon Perla, MD  amLODipine (NORVASC) 5 MG tablet Take 1 tablet (5 mg total) by mouth daily. 02/21/17   Martinique, Peter M, MD  aspirin EC 81 MG tablet Take 81 mg by mouth daily.    [provider]  aspirin-acetaminophen-caffeine (EXCEDRIN MIGRAINE) [phone] MG tablet Take 2 tablets by mouth daily as needed for headache.    [provider]  Inositol Niacinate (NIACIN FLUSH FREE) 500 MG CAPS Take 1,000 mg by mouth daily.    [provider]  lisinopril (PRINIVIL,ZESTRIL) 20 MG tablet Take 1 tablet (20 mg total) by mouth daily. 12/27/16   Rogelia Mire, NP  valACYclovir (VALTREX) 500 MG tablet Take 1 tablet (500 mg total) by mouth daily. Patient taking differently: Take 500-1,000 mg by mouth See admin instructions. Take as needed for fever blisters -  Take 2 tablets (1000 mg) by mouth twice daily for 3-4 days, then take 1 tablet (500 mg) twice daily for 7 days, then stop. 12/14/16   Mina Marble D, NP    Physical Exam: Vitals:   05/16/17 0200 05/16/17 0230 05/16/17 0238 05/16/17 0345  BP: 133/82 136/83  140/85  Pulse: (!) 56 (!) 57  (!) 57  Resp: 13 15  11   Temp:      TempSrc:      SpO2: 97% 97%  96%  Weight:   77.1 kg (170 lb)   Height:   5\' 8"  (1.727 m)    General: Not in acute distress HEENT:       Eyes: PERRL, EOMI, no scleral icterus.       ENT: No discharge from the ears and nose, no pharynx injection, no tonsillar enlargement.        Neck: No JVD, no bruit, no mass felt. Heme: No neck lymph node enlargement. Cardiac: S1/S2, RRR, No murmurs, No gallops or rubs. Respiratory:  No  rales, wheezing, rhonchi or rubs. GI: Soft, nondistended, nontender, no rebound pain, no organomegaly, BS present. GU: No hematuria Ext: No pitting leg edema bilaterally. 2+DP/PT pulse bilaterally. Musculoskeletal: No joint deformities, No joint redness or warmth, no limitation of ROM in spin. Skin: No rashes.  Neuro: Alert, oriented X3, cranial nerves II-XII grossly intact, moves all extremities normally. Psych: Patient is not psychotic, no suicidal or hemocidal ideation.  Labs on Admission: I have personally reviewed following labs and imaging studies  CBC:  Recent Labs Lab 05/16/17 0002  WBC 7.4  HGB 16.4  HCT 47.9  MCV 90.9  PLT 097   Basic Metabolic Panel:  Recent Labs Lab 05/16/17 0002  NA 140  K 4.2  CL 101  CO2 30  GLUCOSE 101*  BUN 15  CREATININE 1.28*  CALCIUM 10.0   GFR: Estimated Creatinine Clearance: 53.4 mL/min (A) (by C-G formula based on SCr of 1.28 mg/dL (H)). Liver Function Tests: No results for input(s): AST, ALT, ALKPHOS, BILITOT, PROT, ALBUMIN in the last 168 hours. No results for input(s): LIPASE, AMYLASE in the last 168 hours. No results for input(s): AMMONIA in the last 168 hours. Coagulation Profile: No results for input(s): INR, PROTIME in the last 168 hours. Cardiac Enzymes: No results for input(s): CKTOTAL, CKMB, CKMBINDEX, TROPONINI in the last 168 hours. BNP (last 3 results) No results for input(s): PROBNP in the last 8760 hours. HbA1C: No results for input(s): HGBA1C in the last 72 hours. CBG: No results for input(s): GLUCAP in the last 168 hours. Lipid Profile: No results for input(s): CHOL, HDL, LDLCALC, TRIG, CHOLHDL, LDLDIRECT in the last 72 hours. Thyroid Function Tests: No results for input(s): TSH, T4TOTAL, FREET4, T3FREE, THYROIDAB in the last 72 hours. Anemia Panel: No results for input(s): VITAMINB12, FOLATE, FERRITIN, TIBC, IRON, RETICCTPCT in the last 72 hours. Urine analysis:    Component Value Date/Time    COLORURINE COLORLESS (A) 02/19/2017 2230   APPEARANCEUR CLEAR 02/19/2017 2230   LABSPEC 1.003 (L) 02/19/2017 2230   PHURINE 7.0 02/19/2017 2230   GLUCOSEU NEGATIVE 02/19/2017 2230   HGBUR SMALL (A) 02/19/2017 2230   BILIRUBINUR NEGATIVE 02/19/2017 2230   KETONESUR NEGATIVE 02/19/2017 2230   PROTEINUR NEGATIVE 02/19/2017 2230   NITRITE NEGATIVE 02/19/2017 2230   LEUKOCYTESUR NEGATIVE 02/19/2017 2230   Sepsis Labs: @LABRCNTIP (procalcitonin:4,lacticidven:4) )No results found for this or any previous visit (from the past 240 hour(s)).   Radiological Exams on Admission: Dg Chest 2 View  Result Date: 05/16/2017 CLINICAL DATA:  Chest pain and shortness of breath EXAM: CHEST  2 VIEW COMPARISON:  02/19/2017 FINDINGS: Minimal atelectasis at the right base. No focal consolidation or effusion. Heart size upper limits of normal. No pneumothorax. IMPRESSION: No active cardiopulmonary disease. Electronically Signed   By: Donavan Foil M.D.   On: 05/16/2017 01:22     EKG: Independently reviewed.  Sinus rhythm, QTC 479, bifascicular block, early R-wave progression  Assessment/Plan Principal Problem:   Chest pain Active Problems:   Dyslipidemia   CAD (coronary artery disease)   HTN (hypertension)   CKD (chronic kidney disease) stage 3, GFR 30-59 ml/min   Chest pain and hx of CAD: Chest x-rays negative for pneumonia. Potential differential diagnoses include PE given pleuritic chest pain and SOB, and ACS given significant history of CAD/STEMI with stent placement. Initial troponin negative.  - will place on Tele bed for obs - cycle CE q6 x3 and repeat EKG in the am  - IV Nitroglycerin gtt - prn Morphine, and aspirin - pt is allergic to statin and Zetia - Risk factor stratification: will check FLP, UDS and A1C  - 2d echo - please call Card in AM - Stat D-dimer, if positive, will start IV heparin and get V/Q scan in am.  Addendum: D-dimer is negative. Trop becomes positive 0.03-->concerning  for NSTEMI. -start IV Heparin  HLD: -pt is allergic to statin -Inositol Niacinate  HTN: -continue amlodipine, lisinopril  CKD (chronic kidney disease) stage 3: Stable. Baseline creatinine 1.2-1.28. His creatinine is 1.28 on admission. -Follow-up renal function by BMP   DVT ppx: on IV heparin Code Status: Full code Family Communication:  Yes, patient's wife  at bed side Disposition Plan:  Anticipate discharge back to previous home environment Consults called: none Admission status:  SDU/obs  Date of Service 05/16/2017    Ivor Costa [facility] Pager [phone]  If 7PM-7AM, please contact night-coverage [email or web address] Password [facility] 05/16/2017, 4:35 AM

## 2017-05-17 ENCOUNTER — Encounter (HOSPITAL_COMMUNITY): Payer: Self-pay | Admitting: Cardiology

## 2017-05-17 ENCOUNTER — Ambulatory Visit: Payer: Medicare HMO | Admitting: Cardiology

## 2017-05-17 DIAGNOSIS — E785 Hyperlipidemia, unspecified: Secondary | ICD-10-CM | POA: Diagnosis not present

## 2017-05-17 DIAGNOSIS — I422 Other hypertrophic cardiomyopathy: Secondary | ICD-10-CM | POA: Diagnosis not present

## 2017-05-17 DIAGNOSIS — R079 Chest pain, unspecified: Secondary | ICD-10-CM | POA: Diagnosis not present

## 2017-05-17 DIAGNOSIS — I25118 Atherosclerotic heart disease of native coronary artery with other forms of angina pectoris: Secondary | ICD-10-CM

## 2017-05-17 DIAGNOSIS — I208 Other forms of angina pectoris: Secondary | ICD-10-CM | POA: Diagnosis present

## 2017-05-17 DIAGNOSIS — N183 Chronic kidney disease, stage 3 (moderate): Secondary | ICD-10-CM | POA: Diagnosis not present

## 2017-05-17 DIAGNOSIS — Z9861 Coronary angioplasty status: Secondary | ICD-10-CM

## 2017-05-17 DIAGNOSIS — Z79899 Other long term (current) drug therapy: Secondary | ICD-10-CM | POA: Diagnosis not present

## 2017-05-17 DIAGNOSIS — I129 Hypertensive chronic kidney disease with stage 1 through stage 4 chronic kidney disease, or unspecified chronic kidney disease: Secondary | ICD-10-CM | POA: Diagnosis not present

## 2017-05-17 DIAGNOSIS — I159 Secondary hypertension, unspecified: Secondary | ICD-10-CM | POA: Diagnosis not present

## 2017-05-17 DIAGNOSIS — Z7982 Long term (current) use of aspirin: Secondary | ICD-10-CM | POA: Diagnosis not present

## 2017-05-17 DIAGNOSIS — I25110 Atherosclerotic heart disease of native coronary artery with unstable angina pectoris: Secondary | ICD-10-CM | POA: Diagnosis not present

## 2017-05-17 DIAGNOSIS — T82855A Stenosis of coronary artery stent, initial encounter: Secondary | ICD-10-CM | POA: Diagnosis not present

## 2017-05-17 DIAGNOSIS — I10 Essential (primary) hypertension: Secondary | ICD-10-CM | POA: Diagnosis not present

## 2017-05-17 DIAGNOSIS — Z955 Presence of coronary angioplasty implant and graft: Secondary | ICD-10-CM | POA: Diagnosis not present

## 2017-05-17 DIAGNOSIS — I200 Unstable angina: Secondary | ICD-10-CM | POA: Diagnosis not present

## 2017-05-17 DIAGNOSIS — I2510 Atherosclerotic heart disease of native coronary artery without angina pectoris: Secondary | ICD-10-CM | POA: Diagnosis not present

## 2017-05-17 DIAGNOSIS — I252 Old myocardial infarction: Secondary | ICD-10-CM | POA: Diagnosis not present

## 2017-05-17 LAB — CBC
HEMATOCRIT: 40.9 % (ref 39.0–52.0)
HEMOGLOBIN: 14.2 g/dL (ref 13.0–17.0)
MCH: 31 pg (ref 26.0–34.0)
MCHC: 34.7 g/dL (ref 30.0–36.0)
MCV: 89.3 fL (ref 78.0–100.0)
Platelets: 163 10*3/uL (ref 150–400)
RBC: 4.58 MIL/uL (ref 4.22–5.81)
RDW: 12.9 % (ref 11.5–15.5)
WBC: 6.8 10*3/uL (ref 4.0–10.5)

## 2017-05-17 LAB — BASIC METABOLIC PANEL
Anion gap: 7 (ref 5–15)
BUN: 14 mg/dL (ref 6–20)
CALCIUM: 8.7 mg/dL — AB (ref 8.9–10.3)
CO2: 23 mmol/L (ref 22–32)
CREATININE: 1.22 mg/dL (ref 0.61–1.24)
Chloride: 109 mmol/L (ref 101–111)
GFR calc Af Amer: >60 mL/min (ref >60)
GFR calc non Af Amer: 59 mL/min — ABNORMAL LOW (ref >60)
GLUCOSE: 91 mg/dL (ref 65–99)
Potassium: 4.1 mmol/L (ref 3.5–5.1)
Sodium: 139 mmol/L (ref 135–145)

## 2017-05-17 LAB — HEMOGLOBIN A1C
HEMOGLOBIN A1C: 5 % (ref 4.8–5.6)
Mean Plasma Glucose: 97 mg/dL

## 2017-05-17 MED ORDER — TICAGRELOR 90 MG PO TABS: 90.0000 mg | ORAL_TABLET | Freq: Two times a day (BID) | ORAL | 0 refills | Status: DC

## 2017-05-17 MED ORDER — CARVEDILOL 3.125 MG PO TABS: 3.1250 mg | ORAL_TABLET | Freq: Two times a day (BID) | ORAL | 0 refills | Status: DC

## 2017-05-17 NOTE — Progress Notes (Signed)
Progress Note  Patient Name: Brandon Gonzales Date of Encounter: 05/17/2017  Primary Cardiologist: Martinique  Subjective   Feeling well this morning. No further chest pain, dressed and ready to go.   Inpatient Medications    Scheduled Meds: . aspirin  81 mg Oral Daily  . carvedilol  3.125 mg Oral BID WC  . lisinopril  20 mg Oral Daily  . niacin  1,000 mg Oral QHS  . pneumococcal 23 valent vaccine  0.5 mL Intramuscular Tomorrow-1000  . sodium chloride flush  3 mL Intravenous Q12H  . ticagrelor  90 mg Oral Q12H   Continuous Infusions: . sodium chloride     PRN Meds: sodium chloride, acetaminophen, aspirin-acetaminophen-caffeine, calcium carbonate, hydrALAZINE, morphine injection, ondansetron (ZOFRAN) IV, sodium chloride flush, zolpidem   Vital Signs    Vitals:   05/17/17 0310 05/17/17 0340 05/17/17 0400 05/17/17 0717  BP: 132/76  118/66 (!) 141/84  Pulse: 63     Resp: 16  (!) 5 16  Temp: 97.9 F (36.6 C) 97.9 F (36.6 C)  97.6 F (36.4 C)  TempSrc: Oral Oral  Oral  SpO2: 96%     Weight:      Height:        Intake/Output Summary (Last 24 hours) at 05/17/17 0935 Last data filed at 05/17/17 0300  Gross per 24 hour  Intake           1642.5 ml  Output              250 ml  Net           1392.5 ml   Filed Weights   05/16/17 0238 05/16/17 1347  Weight: 170 lb (77.1 kg) 166 lb 7.2 oz (75.5 kg)    Telemetry    SR - Personally Reviewed  ECG    SR - Personally Reviewed  Physical Exam   General: Well developed, well nourished, male appearing in no acute distress. Head: Normocephalic, atraumatic.  Neck: Supple without bruits, JVD. Lungs:  Resp regular and unlabored, CTA. Heart: RRR, S1, S2, no S3, S4, 3/6 systolic murmur at URSB; no rub. Abdomen: Soft, non-tender, non-distended with normoactive bowel sounds. No hepatomegaly. No rebound/guarding. No obvious abdominal masses. Extremities: No clubbing, cyanosis, edema. Distal pedal pulses are 2+ bilaterally. Right  radial cath site stable.  Neuro: Alert and oriented X 3. Moves all extremities spontaneously. Psych: Normal affect.  Labs    Chemistry Recent Labs Lab 05/16/17 0002 05/17/17 0302  NA 140 139  K 4.2 4.1  CL 101 109  CO2 30 23  GLUCOSE 101* 91  BUN 15 14  CREATININE 1.28* 1.22  CALCIUM 10.0 8.7*  GFRNONAA 56* 59*  GFRAA >60 >60  ANIONGAP 9 7     Hematology Recent Labs Lab 05/16/17 0002 05/17/17 0302  WBC 7.4 6.8  RBC 5.27 4.58  HGB 16.4 14.2  HCT 47.9 40.9  MCV 90.9 89.3  MCH 31.1 31.0  MCHC 34.2 34.7  RDW 12.9 12.9  PLT 185 163    Cardiac Enzymes Recent Labs Lab 05/16/17 0259 05/16/17 0759  TROPONINI 0.03* <0.03    Recent Labs Lab 05/16/17 0014  TROPIPOC 0.02     BNPNo results for input(s): BNP, PROBNP in the last 168 hours.   DDimer  Recent Labs Lab 05/16/17 0337  DDIMER 0.28      Radiology    Dg Chest 2 View  Result Date: 05/16/2017 CLINICAL DATA:  Chest pain and shortness of breath EXAM: CHEST  2 VIEW COMPARISON:  02/19/2017 FINDINGS: Minimal atelectasis at the right base. No focal consolidation or effusion. Heart size upper limits of normal. No pneumothorax. IMPRESSION: No active cardiopulmonary disease. Electronically Signed   By: Donavan Foil M.D.   On: 05/16/2017 01:22    Cardiac Studies   LHC: 05/16/17  Conclusion     Ost RCA lesion, 40 %stenosed. Mid RCA lesion, 65 %stenosed. Borderline significant.  The left ventricular systolic function is normal.  LV end diastolic pressure is mildly elevated.  There is no mitral valve regurgitation.  There is no aortic valve stenosis.  Prox LAD to Mid LAD bare-metal stent, 20 %stenosed.  Mid LAD lesion, 40 %stenosed. Localized segment of previous bare-metal stent-  2nd Mrg DES stent, 0 %stenosed.  Prox Cx to Mid Cx lesion, 40 %stenosed.  Ost LAD lesion, 95 %stenosed.  Post intervention, there is a 10% residual stenosis.   Mr. Nicolls has several potential culprit lesions,  however the most obvious one in the LAD with 95% stenosis. This is in-stent restenosis, therefore treated with balloon angioplasty using the [facility] Cutting Balloon followed by post-dilation with a noncompliant balloon.  The RCA lesion is of borderline significance, but does not appear to be acute in nature. Would defer intervention on this vessel at this time unless she has ongoing or worsening symptoms.   Plan: Overnight monitoring and likely discharge tomorrow Return to nursing unit for ongoing care and TR been removal. Back on dual antiplatelet therapy.- Reduced aspirin 81 mg him and using Brilinta. Further risk factor modification per rounding team.    Patient Profile     66 y.o. male with past medical history of CAD (s/p DES to LAD in 2007 with PTCA of D1, DES to LAD and LCx in 2013 in the setting of STEMI), hypertrophic cardiomyopathy, Stage 3 CKD, HTN, and HLD  who presented with chest pain.  Assessment & Plan    1. Unstable Angina/ CAD - he has a known history of CAD, s/p DES to LAD in 2007 with PTCA of D1 and DES to LAD and LCx in 2013 in the setting of an anterior STEMI. - he reports experiencing worsening dyspnea on exertion and fatigue for the past several months with associated dizziness and pain radiating to his neck which is identical to what he experienced in 2013 at the time of his STEMI. He developed chest discomfort upon getting in bed last night, therefore he came to the ED for immediate evaluation. Was started on Heparin and IV NTG with  - underwent LHC with Dr. Ellyn Hack noted above with balloon angioplasty to the pLAD 2/2 ISR. Plan for DAPT with ASA/Brilinta. Continue BB, ACEi and niacin.  - will arrange follow up appt.   2. HOCM - he appears euvolemic on examination. - continue BP management BB and Acei.   3. HTN - BP remains well-controlled.    4. HLD - Lipid Panel shows total cholesterol 163, HDL 28, LDL 81. - is participating in the CLEAR study. On  Niacin  5. Stage 3 CKD - creatinine is stable at 1.22  Signed, Reino Bellis, NP  05/17/2017, 9:35 AM     Agree with assessment and plan. Cath data reviewed; s/p cutting balloon PCI to high grade LAD in-stent stenosis. No recurrent cp; dc today.   Troy Sine, MD, [facility] 05/17/2017 12:47 PM

## 2017-05-17 NOTE — Care Management Note (Signed)
Case Management Note  Patient Details  Name: Brandon Gonzales MRN: [id number] Date of Birth: [birth year]  Subjective/Objective:    Pt admitted with CP and SOB                Action/Plan:   PTA independent from home.  Pt will discharge home on Brilinta .  CM unable to provide free 30day card because pt states he has already used the one time benefit when he was on drug previously.   CM submitted benefit check - result was $42 a month and doesn't require prior approval.   Pt has PCP and denied barriers to obtaining medications as prescribed.  CM called pharmacy of choice [facility] on [address] can fill prescription in full today   Expected Discharge Date:  05/17/17               Expected Discharge Plan:  Home/Self Care  In-House Referral:     Discharge planning Services  CM Consult, Medication Assistance  Post Acute Care Choice:    Choice offered to:     DME Arranged:    DME Agency:     HH Arranged:    Luis M. Cint[facility] Agency:     Status of Service:  Completed, signed off  If discussed at [employer], dates discussed:    Additional Comments:  Maryclare Labrador, RN 05/17/2017, 10:22 AM

## 2017-05-17 NOTE — Discharge Instructions (Signed)
Follow with Primary MD [name], NP in 7 days   Get CBC, CMP, 2 view Chest X ray checked  by Primary MD or SNF MD in 5-7 days ( we routinely change or add medications that can affect your baseline labs and fluid status, therefore we recommend that you get the mentioned basic workup next visit with your PCP, your PCP may decide not to get them or add new tests based on their clinical decision)  Activity: As tolerated with Full fall precautions use walker/cane & assistance as needed  Disposition Home    Diet:  Heart Healthy    For Heart failure patients - Check your Weight same time everyday, if you gain over 2 pounds, or you develop in leg swelling, experience more shortness of breath or chest pain, call your Primary MD immediately. Follow Cardiac Low Salt Diet and 1.5 lit/day fluid restriction.  On your next visit with your primary care physician please Get Medicines reviewed and adjusted.  Please request your Prim.MD to go over all Hospital Tests and Procedure/Radiological results at the follow up, please get all Hospital records sent to your Prim MD by signing hospital release before you go home.  If you experience worsening of your admission symptoms, develop shortness of breath, life threatening emergency, suicidal or homicidal thoughts you must seek medical attention immediately by calling 911 or calling your MD immediately  if symptoms less severe.  You Must read complete instructions/literature along with all the possible adverse reactions/side effects for all the Medicines you take and that have been prescribed to you. Take any new Medicines after you have completely understood and accpet all the possible adverse reactions/side effects.   Do not drive, operate heavy machinery, perform activities at heights, swimming or participation in water activities or provide baby sitting services if your were admitted for syncope or siezures until you have seen by Primary MD or a Neurologist  and advised to do so again.  Do not drive when taking Pain medications.    Do not take more than prescribed Pain, Sleep and Anxiety Medications  Special Instructions: If you have smoked or chewed Tobacco  in the last 2 yrs please stop smoking, stop any regular Alcohol  and or any Recreational drug use.  Wear Seat belts while driving.   Please note  You were cared for by a hospitalist during your hospital stay. If you have any questions about your discharge medications or the care you received while you were in the hospital after you are discharged, you can call the unit and asked to speak with the hospitalist on call if the hospitalist that took care of you is not available. Once you are discharged, your primary care physician will handle any further medical issues. Please note that NO REFILLS for any discharge medications will be authorized once you are discharged, as it is imperative that you return to your primary care physician (or establish a relationship with a primary care physician if you do not have one) for your aftercare needs so that they can reassess your need for medications and monitor your lab values.

## 2017-05-17 NOTE — Care Management CC44 (Signed)
Condition Code 44 Documentation Completed  Patient Details  Name: Brandon Gonzales MRN: [id number] Date of Birth: Feb 13, 1951   Condition Code 44 given:    Patient signature on Condition Code 44 notice:    Documentation of 2 MD's agreement:    Code 44 added to claim:       Maryclare Labrador, RN 05/17/2017, 10:50 AM

## 2017-05-17 NOTE — Progress Notes (Signed)
CARDIAC REHAB PHASE I   Pt just returned from walking with RN, denies any complaints, dressed and awaiting discharge. Completed PCI education with pt and wife at bedside.  Reviewed risk factors, anti-platelet therapy, activity restrictions, ntg, exercise, heart healthy diet and phase 2 cardiac rehab. Pt verbalized understanding, receptive to education. Pt agrees to phase 2 cardiac rehab referral, will send to [facility] per pt request, he has participated in the program in the past. Case manager to see pt regarding brilinta prior to discharge. Pt up ad lib in room, awaiting discharge.  [id number] Lenna Sciara, RN, BSN 05/17/2017 10:43 AM

## 2017-05-17 NOTE — Progress Notes (Signed)
Pt being discharged home via wheelchair with family. Pt alert and oriented x4. VSS. Pt c/o no pain at this time. No signs of respiratory distress. Education complete and care plans resolved. IVx2 removed with catheter intact and pt tolerated well. No further issues at this time. Pt to follow up with PCP. Malaika Arnall R, RN 

## 2017-05-17 NOTE — Discharge Summary (Signed)
Versie Fleener Gude MWN:[id number] DOB: [birth year] DOA: 05/16/2017  PCP: [name], NP  Admit date: 05/16/2017  Discharge date: 05/17/2017  Admitted From:  Home   Disposition:  Home   Recommendations for Outpatient Follow-up:   Follow up with PCP in 1-2 weeks  PCP Please obtain BMP/CBC, 2 view CXR in 1week,  (see Discharge instructions)   PCP Please follow up on the following pending results: Monitor secondary to his factors for CAD, we are giving him 30 day Brilinta supplied thereafter to be refilled by either cardiology or PCP   Home Health: None   Equipment/Devices: None Consultations: Cardiology Discharge Condition: Stable   CODE STATUS: Full   Diet Recommendation:  Heart Healthy    Chief Complaint  Patient presents with  . Chest Pain     Brief history of present illness from the day of admission and additional interim summary    Demarus Latterell Pinnixis a 66 y.o.malewith medical history significant of hypertension, hyperlipidemia, GERD, CAD, STEMI, s/p of stent placement, chronic kidney disease-stage III, hypertrophic cardiomyopathy, who presents with chest pain andshortness of breath.  Patient states that he has been having intermittent chest discomfort and shortness of breath since January. It is associated with lightheadedness. Since last night, patient developed chest pain, which is located in the left side of chest, pressure-like pain, 6 out of 10 in severity, radiating to the left arm and dry. He has exertional SOB. His chest pain is pleuritic, aggravated by deep breath. Denies tenderness in the calf area. No cough, fever or chills. Patient denies nausea, vomiting, diarrhea, abdominal pain. No unilateral weakness                                                                 Hospital Course    1.  Chest pain in a patient with history of CAD and multiple stents in the past. He was admitted with unstable angina and placed on IV heparin and nitro drip, troponin remained negative for seen by cardiology and underwent left heart cath on 05/16/2017 see report below, basically he had over 90% in-stent stenosis of his LAD requiring angioplasty by Dr. Ellyn Hack, subsequently placed on aspirin and beta Reino Bellis along with beta blocker, now symptom-free cleared by cardiac you for discharge with outpatient follow-up with PCP and his general cardiologist Dr Martinique. We are applying thirty-day Brilinta card thereafter per PCP and cardiology.  2. Dyslipidemia. Allergic to statin and Zetia, defer management to cardiology.  3. Hypertension. On lisinopril have stopped Norvasc and placed him on Coreg instead.  4. CKD3 baseline creatinine of around 1.2-1.3, he is at baseline.   Discharge diagnosis     Principal Problem:   Chest pain Active Problems:   Dyslipidemia   CAD (coronary artery disease)   HTN (hypertension)   CKD (  chronic kidney disease) stage 3, GFR 30-59 ml/min   Unstable angina (HCC)   Hypertrophic cardiomyopathy (HCC)    Discharge instructions    Discharge Instructions    Diet - low sodium heart healthy    Complete by:  As directed    Discharge instructions    Complete by:  As directed    Follow with Primary MD [name], NP in 7 days   Get CBC, CMP, 2 view Chest X ray checked  by Primary MD or SNF MD in 5-7 days ( we routinely change or add medications that can affect your baseline labs and fluid status, therefore we recommend that you get the mentioned basic workup next visit with your PCP, your PCP may decide not to get them or add new tests based on their clinical decision)  Activity: As tolerated with Full fall precautions use walker/cane & assistance as needed  Disposition Home    Diet:  Heart Healthy    For Heart failure patients - Check your Weight same time  everyday, if you gain over 2 pounds, or you develop in leg swelling, experience more shortness of breath or chest pain, call your Primary MD immediately. Follow Cardiac Low Salt Diet and 1.5 lit/day fluid restriction.  On your next visit with your primary care physician please Get Medicines reviewed and adjusted.  Please request your Prim.MD to go over all Hospital Tests and Procedure/Radiological results at the follow up, please get all Hospital records sent to your Prim MD by signing hospital release before you go home.  If you experience worsening of your admission symptoms, develop shortness of breath, life threatening emergency, suicidal or homicidal thoughts you must seek medical attention immediately by calling 911 or calling your MD immediately  if symptoms less severe.  You Must read complete instructions/literature along with all the possible adverse reactions/side effects for all the Medicines you take and that have been prescribed to you. Take any new Medicines after you have completely understood and accpet all the possible adverse reactions/side effects.   Do not drive, operate heavy machinery, perform activities at heights, swimming or participation in water activities or provide baby sitting services if your were admitted for syncope or siezures until you have seen by Primary MD or a Neurologist and advised to do so again.  Do not drive when taking Pain medications.    Do not take more than prescribed Pain, Sleep and Anxiety Medications  Special Instructions: If you have smoked or chewed Tobacco  in the last 2 yrs please stop smoking, stop any regular Alcohol  and or any Recreational drug use.  Wear Seat belts while driving.   Please note  You were cared for by a hospitalist during your hospital stay. If you have any questions about your discharge medications or the care you received while you were in the hospital after you are discharged, you can call the unit and asked to  speak with the hospitalist on call if the hospitalist that took care of you is not available. Once you are discharged, your primary care physician will handle any further medical issues. Please note that NO REFILLS for any discharge medications will be authorized once you are discharged, as it is imperative that you return to your primary care physician (or establish a relationship with a primary care physician if you do not have one) for your aftercare needs so that they can reassess your need for medications and monitor your lab values.   Increase activity  slowly    Complete by:  As directed       Discharge Medications   Allergies as of 05/17/2017      Reactions   Antihistamines, Chlorpheniramine-type Other (See Comments)   Altered mental status   Pheniramine Other (See Comments)   Altered mental status   Statins Other (See Comments)   Leg cramps     Zetia [ezetimibe] Other (See Comments)   Leg cramps      Medication List    STOP taking these medications   amLODipine 5 MG tablet Commonly known as:  NORVASC     TAKE these medications   AMBULATORY NON FORMULARY MEDICATION Take 180 mg by mouth daily. Medication Name: bempedoic acid vs a placebo CLEAR Research Study drug provided   aspirin EC 81 MG tablet Take 81 mg by mouth daily.   aspirin-acetaminophen-caffeine 250-250-65 MG tablet Commonly known as:  EXCEDRIN MIGRAINE Take 2 tablets by mouth daily as needed for headache.   calcium carbonate 750 MG chewable tablet Commonly known as:  TUMS EX Chew 2 tablets by mouth daily as needed for heartburn.   carvedilol 3.125 MG tablet Commonly known as:  COREG Take 1 tablet (3.125 mg total) by mouth 2 (two) times daily with a meal.   lisinopril 20 MG tablet Commonly known as:  PRINIVIL,ZESTRIL Take 1 tablet (20 mg total) by mouth daily.   NIACIN FLUSH FREE 500 MG Caps Generic drug:  Inositol Niacinate Take 1,000 mg by mouth daily.   ticagrelor 90 MG Tabs tablet Commonly  known as:  BRILINTA Take 1 tablet (90 mg total) by mouth every 12 (twelve) hours.   valACYclovir 500 MG tablet Commonly known as:  VALTREX Take 1 tablet (500 mg total) by mouth daily. What changed:  how much to take  when to take this  additional instructions       Follow-up Information    Almyra Deforest, [place] Follow up on 05/28/2017.   Specialties:  Cardiology, Radiology Why:  at 8:30am for your follow up appt.  Contact information: [address] [place] [address] [phone]        [name], NP. Schedule an appointment as soon as possible for a visit in 1 week(s).   Specialty:  Family Medicine Contact information: [place] [address] [phone]           Major procedures and Radiology Reports - PLEASE review detailed and final reports thoroughly  -     Left heart cath done by Dr. Ellyn Hack 05/16/2017   Ost RCA lesion, 40 %stenosed. Mid RCA lesion, 65 %stenosed. Borderline significant.  The left ventricular systolic function is normal.  LV end diastolic pressure is mildly elevated.  There is no mitral valve regurgitation.  There is no aortic valve stenosis.  Prox LAD to Mid LAD bare-metal stent, 20 %stenosed.  Mid LAD lesion, 40 %stenosed. Localized segment of previous bare-metal stent-  2nd Mrg DES stent, 0 %stenosed.  Prox Cx to Mid Cx lesion, 40 %stenosed.  Ost LAD lesion, 95 %stenosed.  Post intervention, there is a 10% residual stenosis.   Mr. Harrison has several potential culprit lesions, however the most obvious one in the LAD with 95% stenosis. This is in-stent restenosis, therefore treated with balloon angioplasty using the [facility] Cutting Balloon followed by post-dilation with a noncompliant balloon.  The RCA lesion is of borderline significance, but does not appear to be acute in nature. Would defer intervention on this vessel at this  time unless she has ongoing or worsening  symptoms.   Plan: Overnight monitoring and likely discharge tomorrow Return to nursing unit for ongoing care and TR been removal. Back on dual antiplatelet therapy.- Reduced aspirin 81 mg him and using Brilinta. Further risk factor modification per rounding team.   He can follow-up with Dr. Martinique, will need to have a follow-up appointment rescheduled as he was post be there tomorrow June 14.   Dg Chest 2 View  Result Date: 05/16/2017 CLINICAL DATA:  Chest pain and shortness of breath EXAM: CHEST  2 VIEW COMPARISON:  02/19/2017 FINDINGS: Minimal atelectasis at the right base. No focal consolidation or effusion. Heart size upper limits of normal. No pneumothorax. IMPRESSION: No active cardiopulmonary disease. Electronically Signed   By: Donavan Foil M.D.   On: 05/16/2017 01:22    Micro Results     Recent Results (from the past 240 hour(s))  MRSA PCR Screening     Status: None   Collection Time: 05/16/17  5:44 AM  Result Value Ref Range Status   MRSA by PCR NEGATIVE NEGATIVE Final    Comment:        The GeneXpert MRSA Assay (FDA approved for NASAL specimens only), is one component of a comprehensive MRSA colonization surveillance program. It is not intended to diagnose MRSA infection nor to guide or monitor treatment for MRSA infections.     Today   Subjective    Ancil Svehla today has no headache,no chest abdominal pain,no new weakness tingling or numbness, feels much better wants to go home today.     Objective   Blood pressure (!) 141/84, pulse 63, temperature 97.6 F (36.4 C), temperature source Oral, resp. rate 16, height 5\' 8"  (1.727 m), weight 75.5 kg (166 lb 7.2 oz), SpO2 96 %.   Intake/Output Summary (Last 24 hours) at 05/17/17 1000 Last data filed at 05/17/17 0300  Gross per 24 hour  Intake             1532 ml  Output              250 ml  Net             1282 ml    Exam Awake Alert, Oriented x 3, No new F.N deficits, Normal  affect [place].AT,PERRAL Supple Neck,No JVD, No cervical lymphadenopathy appriciated.  Symmetrical Chest wall movement, Good air movement bilaterally, CTAB RRR,No Gallops,Rubs or new Murmurs, No Parasternal Heave +ve B.Sounds, Abd Soft, Non tender, No organomegaly appriciated, No rebound -guarding or rigidity. No Cyanosis, Clubbing or edema, No new Rash or bruise   Data Review   CBC w Diff: Lab Results  Component Value Date   WBC 6.8 05/17/2017   HGB 14.2 05/17/2017   HGB 16.8 12/28/2016   HCT 40.9 05/17/2017   HCT 49.1 12/28/2016   PLT 163 05/17/2017   PLT 161 12/28/2016   LYMPHOPCT 27 09/22/2012   MONOPCT 10 09/22/2012   EOSPCT 1 09/22/2012   BASOPCT 0 09/22/2012    CMP: Lab Results  Component Value Date   NA 139 05/17/2017   NA 142 01/11/2017   K 4.1 05/17/2017   CL 109 05/17/2017   CL 106 09/13/2010   CO2 23 05/17/2017   BUN 14 05/17/2017   BUN 20 01/11/2017   CREATININE 1.22 05/17/2017   GLU 82 09/13/2010   PROT 6.0 01/11/2017   ALBUMIN 4.3 01/11/2017   ALBUMIN 4.9 09/13/2010   BILITOT 1.8 (H) 01/11/2017   ALKPHOS 48 01/11/2017  AST 21 01/11/2017   ALT 20 01/11/2017  .   Total Time in preparing paper work, data evaluation and todays exam - 24 minutes  Lala Lund M.D on 05/17/2017 at 10:00 AM  [facility]   Office  [phone]

## 2017-05-21 ENCOUNTER — Telehealth: Payer: Self-pay | Admitting: Cardiology

## 2017-05-21 NOTE — Telephone Encounter (Signed)
S/w pt he states that he is having chest soreness-like a pulled muscle in his chest(34-4/10) since having Left heart cath w/angioplasty. He states that the pain is only w/movement ex.twisting, etc... He states that this muscle pain increases a little 4-5/10 when he lies down-once again this pain is only when turning over in bed, etc. Pt denies any other sx no CP or pressure, nausea SOB, sweating, etc... Pt states that he would like to let Dr Martinique know and see what he thinks. He states that he does not need appt and does not think he needs to go to the ER, he states that he will if he needs to, or Dr Martinique thinks he should.

## 2017-05-21 NOTE — Telephone Encounter (Signed)
Patient calling, states that he had Cath and now he states that "my heart feels sore and my chest is sore, I want to see if this is normal? Please call, thanks.

## 2017-05-21 NOTE — Telephone Encounter (Signed)
It sounds different from his anginal symptoms. More musculoskeletal. Would just take Tylenol and avoid lifting.  Calvary Difranco Martinique MD, [facility]

## 2017-05-22 ENCOUNTER — Ambulatory Visit: Payer: Medicare HMO | Admitting: Cardiology

## 2017-05-22 ENCOUNTER — Telehealth (HOSPITAL_COMMUNITY): Payer: Self-pay

## 2017-05-22 NOTE — Telephone Encounter (Signed)
Patient insurance is active and benefits verified. Patient has [employer] - $45.00 co-payment, no deductible, out of pocket $4500/$773.21 has been met, no co-insurance, no pre-authorization and no limit on visit. Passport/reference [phone].

## 2017-05-23 ENCOUNTER — Telehealth (HOSPITAL_COMMUNITY): Payer: Self-pay

## 2017-05-23 NOTE — Telephone Encounter (Signed)
Patient returning your call, thanks. °

## 2017-05-23 NOTE — Telephone Encounter (Signed)
Called patient, goes to voice mail. Left msg to call.

## 2017-05-23 NOTE — Telephone Encounter (Signed)
I called and spoke to patient about scheduling for cardiac rehab. Patient stated he will call back, he is not ready right now. Patient given office contact information to return call to schedule.

## 2017-05-23 NOTE — Telephone Encounter (Signed)
I returned call and spoke to patient, communicated Dr. Doug Sou recommendations.    He will f/u as scheduled w Isaac Laud on 6/25. Aware to call sooner if new issues or continued concerns.  Pt expressed understanding and thanks for call.

## 2017-05-24 ENCOUNTER — Ambulatory Visit: Payer: Medicare HMO | Admitting: Cardiology

## 2017-05-24 ENCOUNTER — Ambulatory Visit: Payer: Medicare HMO | Admitting: Physician Assistant

## 2017-05-28 ENCOUNTER — Encounter: Payer: Self-pay | Admitting: Physician Assistant

## 2017-05-28 ENCOUNTER — Ambulatory Visit (INDEPENDENT_AMBULATORY_CARE_PROVIDER_SITE_OTHER): Payer: Medicare HMO | Admitting: Physician Assistant

## 2017-05-28 VITALS — BP 146/94 | HR 65 | Ht 68.0 in | Wt 171.0 lb

## 2017-05-28 DIAGNOSIS — E785 Hyperlipidemia, unspecified: Secondary | ICD-10-CM

## 2017-05-28 DIAGNOSIS — R072 Precordial pain: Secondary | ICD-10-CM | POA: Diagnosis not present

## 2017-05-28 DIAGNOSIS — N183 Chronic kidney disease, stage 3 unspecified: Secondary | ICD-10-CM

## 2017-05-28 DIAGNOSIS — I10 Essential (primary) hypertension: Secondary | ICD-10-CM

## 2017-05-28 DIAGNOSIS — I25118 Atherosclerotic heart disease of native coronary artery with other forms of angina pectoris: Secondary | ICD-10-CM | POA: Diagnosis not present

## 2017-05-28 DIAGNOSIS — R011 Cardiac murmur, unspecified: Secondary | ICD-10-CM

## 2017-05-28 NOTE — Progress Notes (Signed)
Cardiology Office Note    Date:  05/28/2017   ID:  Brandon Gonzales, DOB [birth year], MRN [id number]  PCP:  [name], NP  Cardiologist:  Dr. Martinique  Chief Complaint  Patient presents with  . Follow-up    pt denied chest pain but c/o soreness in chest    History of Present Illness:  Brandon Gonzales is a 66 y.o. male with PMH of CAD (s/p DES to LAD in 2007 with PTCA of D1, DES to LAD and LCx in 2013), hypertrophic cardiomyopathy, CKD stage III, HTN, and HLD. He was previously admitted in March with chest pain. Nuclear stress test obtained on 03/02/2017 showed EF 51%, small nonreversible defect of moderate severity present in the basal inferolateral and mid inferolateral location, overall considered low risk study. He was recently admitted on 05/16/2017 with chest pain which was reminiscent of the previous angina. D-dimer was negative. Troponin was negative. His symptom was concerning for angina, therefore he underwent cardiac catheterization which showed borderline 65% mid RCA lesion, 40% ostial RCA lesion, patent OM 2 stent, 40% mid LAD stenosis close to the previously placed bare metal stent, 95% ostial LAD in-stent restenosis treated with balloon angioplasty. Postprocedure, he was placed on aspirin and Brilinta. His LDL is 81 with goal of less than 70, he is currently participating in the CLEAR study.   He presents today for post hospital follow-up. He does have left-sided chest soreness since discharge. This is different from his angina which he described as a substernal chest pressure right in the middle. The chest soreness that is worse with palpation, deep inspiration and body rotation. This is different from his usual angina, I recommended some Tylenol. EKG obtained today showed T wave inversion in V3 through V5 which is unchanged compared to post cath EKG. Otherwise, his dyspnea on exertion has improved. His blood pressure is mildly elevated today, however it is normal usually in the  151 systolic at home. I will hold off on uptitrating his medication. In fact his blood pressure was low prior to cardiac catheterization so his amlodipine have to be discontinued during recent hospitalization. He can follow-up with Dr. Martinique in 2-3 month. Interestingly, despite the fact recent echocardiogram did not reveal significant valvular issues. He has a 2/6 systolic murmur in the apex area. This murmur becomes 4/6 during expiratory phase.    Past Medical History:  Diagnosis Date  . Arthritis   . Cancer (HCC)    SKIN CA  SQUAMOUS CELL  . Coronary artery disease    a. 2007 Stent to LAD, PTCA D1;  b. DES to LAD & LCx 10/13 in the setting of STEMI.  [name] Kitchen Dyslipidemia    a. Statin and zetia-intolerant. On niacin.  [name] Kitchen GERD (gastroesophageal reflux disease)   . Heart murmur   . Hypertension   . Hypertrophic cardiomyopathy ([place])    a. 09/2012 Echo: EF 55%, mid to apical anterolateral, posterior, apical anterior, apical HK, Gr1 DD, turbulence across LVOT suggesting degree of obstruction, mild MR, mildly dil LA.    Past Surgical History:  Procedure Laterality Date  . CARDIAC CATHETERIZATION  2007   stent to LAD and PTCA of diagonal branch  . CARDIAC CATHETERIZATION  2010  . CORONARY ANGIOPLASTY WITH STENT PLACEMENT  2013   95-99% prox LAD, patent LAD stent distal to this, occluded mid-dist LCx, mild <10% RCA irreg; s/p DES-prox LAD & DES to mid-dist LCx  . CORONARY BALLOON ANGIOPLASTY N/A 05/16/2017   Procedure: Coronary Balloon  Angioplasty;  Surgeon: Leonie Man, MD;  Location: [facility];  Service: Cardiovascular;  Laterality: N/A;  . [place]   right  . [place]   left  . LEFT HEART CATH Bilateral 09/22/2012   Procedure: LEFT HEART CATH;  Surgeon: Peter M Martinique, MD;  Location: [facility] CATH LAB;  Service: Cardiovascular;  Laterality: Bilateral;  . LEFT HEART CATH AND CORONARY ANGIOGRAPHY N/A 05/16/2017   Procedure: Left Heart Cath and Coronary Angiography;   Surgeon: Leonie Man, MD;  Location: [facility];  Service: Cardiovascular;  Laterality: N/A;  . PERCUTANEOUS CORONARY STENT INTERVENTION (PCI-S) Right 09/22/2012   Procedure: PERCUTANEOUS CORONARY STENT INTERVENTION (PCI-S);  Surgeon: Peter M Martinique, MD;  Location: [facility] CATH LAB;  Service: Cardiovascular;  Laterality: Right;    Current Medications: Outpatient Medications Prior to Visit  Medication Sig Dispense Refill  . AMBULATORY NON FORMULARY MEDICATION Take 180 mg by mouth daily. Medication Name: bempedoic acid vs a placebo CLEAR Research Study drug provided    . aspirin EC 81 MG tablet Take 81 mg by mouth daily.    [name] Kitchen aspirin-acetaminophen-caffeine (EXCEDRIN MIGRAINE) 250-250-65 MG tablet Take 2 tablets by mouth daily as needed for headache.    . calcium carbonate (TUMS EX) 750 MG chewable tablet Chew 2 tablets by mouth daily as needed for heartburn.    . carvedilol (COREG) 3.125 MG tablet Take 1 tablet (3.125 mg total) by mouth 2 (two) times daily with a meal. 60 tablet 0  . Inositol Niacinate (NIACIN FLUSH FREE) 500 MG CAPS Take 1,000 mg by mouth daily.    [name] Kitchen lisinopril (PRINIVIL,ZESTRIL) 20 MG tablet Take 1 tablet (20 mg total) by mouth daily. 90 tablet 1  . ticagrelor (BRILINTA) 90 MG TABS tablet Take 1 tablet (90 mg total) by mouth every 12 (twelve) hours. 60 tablet 0  . valACYclovir (VALTREX) 500 MG tablet Take 1 tablet (500 mg total) by mouth daily. (Patient taking differently: Take 500-1,000 mg by mouth See admin instructions. Take as needed for fever blisters -  Take 2 tablets (1000 mg) by mouth twice daily for 3-4 days, then take 1 tablet (500 mg) twice daily for 7 days, then stop.) 90 tablet 3   No facility-administered medications prior to visit.      Allergies:   Antihistamines, chlorpheniramine-type; Pheniramine; Statins; and Zetia [ezetimibe]   Social History   Social History  . Marital status: Married    Spouse name: N/A  . Number of children: N/A  . Years of  education: N/A   Social History Main Topics  . Smoking status: Never Smoker  . Smokeless tobacco: Never Used  . Alcohol use No  . Drug use: No  . Sexual activity: Not Currently   Other Topics Concern  . None   Social History Narrative  . None     Family History:  The patient's family history includes Heart attack (age of onset: 93) in his mother; Heart disease in his mother.   ROS:   Please see the history of present illness.    ROS All other systems reviewed and are negative.   PHYSICAL EXAM:   VS:  BP (!) 146/94   Pulse 65   Ht 5\' 8"  (1.727 m)   Wt 171 lb (77.6 kg)   BMI 26.00 kg/m    GEN: Well nourished, well developed, in no acute distress  HEENT: normal  Neck: no JVD, carotid bruits, or masses Cardiac: RRR; no rubs,  or gallops,no edema  2/6 murmur in apex location Respiratory:  clear to auscultation bilaterally, normal work of breathing GI: soft, nontender, nondistended, + BS MS: no deformity or atrophy  Skin: warm and dry, no rash Neuro:  Alert and Oriented x 3, Strength and sensation are intact Psych: euthymic mood, full affect  Wt Readings from Last 3 Encounters:  05/28/17 171 lb (77.6 kg)  05/16/17 166 lb 7.2 oz (75.5 kg)  04/18/17 172 lb (78 kg)      Studies/Labs Reviewed:   EKG:  EKG is ordered today.  The ekg ordered today demonstrates Normal sinus rhythm with T-wave inversion in lead V3 through V5  Recent Labs: 01/11/2017: ALT 20 02/19/2017: B Natriuretic Peptide 50.0 05/17/2017: BUN 14; Creatinine, Ser 1.22; Hemoglobin 14.2; Platelets 163; Potassium 4.1; Sodium 139   Lipid Panel    Component Value Date/Time   CHOL 163 05/16/2017 0337   CHOL 202 (H) 12/28/2016 0843   TRIG 269 (H) 05/16/2017 0337   HDL 28 (L) 05/16/2017 0337   HDL 36 (L) 12/28/2016 0843   CHOLHDL 5.8 05/16/2017 0337   VLDL 54 (H) 05/16/2017 0337   LDLCALC 81 05/16/2017 0337   LDLCALC 146 (H) 12/28/2016 0843   LDLDIRECT 159.5 10/01/2012 0847    Additional studies/ records  that were reviewed today include:   Myoview 03/02/2017 Study Highlights     The left ventricular ejection fraction is mildly decreased (45-54%).  Nuclear stress EF: 51%.  Blood pressure demonstrated a hypotensive response to exercise.  There was no ST segment deviation noted during stress.  Defect 1: There is a small defect of moderate severity present in the basal inferolateral and mid inferolateral location.  This is a low risk study.   There is a small non-reversible perfusion defect in the basal and mid inferolateral walls consistent with prior infarct and ischemia.  There is hypokinesis in the  basal and mid inferolateral walls.      Cath 05/16/2017 Conclusion     Ost RCA lesion, 40 %stenosed. Mid RCA lesion, 65 %stenosed. Borderline significant.  The left ventricular systolic function is normal.  LV end diastolic pressure is mildly elevated.  There is no mitral valve regurgitation.  There is no aortic valve stenosis.  Prox LAD to Mid LAD bare-metal stent, 20 %stenosed.  Mid LAD lesion, 40 %stenosed. Localized segment of previous bare-metal stent-  2nd Mrg DES stent, 0 %stenosed.  Prox Cx to Mid Cx lesion, 40 %stenosed.  Ost LAD lesion, 95 %stenosed.  Post intervention, there is a 10% residual stenosis.   Mr. Macdonnell has several potential culprit lesions, however the most obvious one in the LAD with 95% stenosis. This is in-stent restenosis, therefore treated with balloon angioplasty using the [facility] Cutting Balloon followed by post-dilation with a noncompliant balloon.  The RCA lesion is of borderline significance, but does not appear to be acute in nature. Would defer intervention on this vessel at this time unless she has ongoing or worsening symptoms.   Plan: Overnight monitoring and likely discharge tomorrow Return to nursing unit for ongoing care and TR been removal. Back on dual antiplatelet therapy.- Reduced aspirin 81 mg him and using  Brilinta. Further risk factor modification per rounding team.   He can follow-up with Dr. Martinique, will need to have a follow-up appointment rescheduled as he was post be there tomorrow June 14.     ASSESSMENT:    1. Chest pain, precordial   2. Coronary artery disease involving native coronary artery of  native heart with other form of angina pectoris ([place])   3. CKD (chronic kidney disease), stage III   4. Essential hypertension   5. Hyperlipidemia, unspecified hyperlipidemia type   6. Heart murmur      PLAN:  In order of problems listed above:  1. Precordial chest pain: Described as left-sided chest pain worse with palpation, deep inspiration and the body rotation. Recommended some Tylenol. EKG today showed no significant change compared to post cath EKG. Symptom very atypical. I would not recommend any workup at this time. If his symptoms does worsen, he will need to let us now. This precordial chest pain is different from his usual angina which is a more midline substernal chest discomfort  2. CAD: Status post recent balloon angioplasty to proximal LAD lesion. He still has a 65% mid RCA lesion.   3. Hypertension: Blood pressure mildly elevated in the office today, however he says his systolic blood pressure usually runs in the 120s. In fact the amlodipine was stopped in the hospital because of low blood pressure. We'll keep him on the current blood pressure medication for now.  4. Hyperlipidemia: He is enrolled in CLEAR trial  5. CKD stage III: Renal function stable  6. Heart murmur: Appears to have a mitral murmur in the apex location, this murmur is usually 2 out of 6 in intensity, however progressed to 4 out of 6 with expiratory phase    Medication Adjustments/Labs and Tests Ordered: Current medicines are reviewed at length with the patient today.  Concerns regarding medicines are outlined above.  Medication changes, Labs and Tests ordered today are listed in the Patient  Instructions below. Patient Instructions  Medication Instructions:   No changes You may take Tylenol as needed for the chest discomfort. If the symptoms become worse please let us know.  Labwork:   none  Testing/Procedures:  none  Follow-Up:  2-3 months with Dr. Martinique    If you need a refill on your cardiac medications before your next appointment, please call your pharmacy.      Hilbert Corrigan, [place]  05/28/2017 10:52 AM    [address], [place], [place]  [address] Phone: [phone]; Fax: [phone]

## 2017-05-28 NOTE — Patient Instructions (Signed)
Medication Instructions:   No changes You may take Tylenol as needed for the chest discomfort. If the symptoms become worse please let us know.  Labwork:   none  Testing/Procedures:  none  Follow-Up:  2-3 months with Dr. Martinique    If you need a refill on your cardiac medications before your next appointment, please call your pharmacy.

## 2017-05-30 ENCOUNTER — Encounter (HOSPITAL_COMMUNITY): Payer: Self-pay

## 2017-05-30 NOTE — Progress Notes (Signed)
I have mailed patient letter with information about cardiac rehab

## 2017-06-18 ENCOUNTER — Telehealth: Payer: Self-pay | Admitting: Cardiology

## 2017-06-18 MED ORDER — CARVEDILOL 3.125 MG PO TABS: 3.1250 mg | ORAL_TABLET | Freq: Two times a day (BID) | ORAL | 1 refills | Status: DC

## 2017-06-18 MED ORDER — NITROGLYCERIN 0.4 MG SL SUBL
0.4000 mg | SUBLINGUAL_TABLET | SUBLINGUAL | 3 refills | Status: DC | PRN
Start: 2017-06-18 — End: 2019-01-13

## 2017-06-18 MED ORDER — TICAGRELOR 90 MG PO TABS: 90.0000 mg | ORAL_TABLET | Freq: Two times a day (BID) | ORAL | 1 refills | Status: DC

## 2017-06-18 NOTE — Telephone Encounter (Signed)
New Message  Per pt states he is having issues with the pharmacy getting his prescriptions filled. Pt does not have the names of the medications. Pt states he was prescribed the meds after his procedure. Please call back to discuss

## 2017-06-18 NOTE — Telephone Encounter (Signed)
Spoke to patient, needing rx(s) for carvedilol and brilinta sent to pharmacy for renewal (he initially only had 30 day rx from hospital discharge post-cath). Also reupped his nitro SL as he reported needing to replace expired med. Pt aware to call if further needs.

## 2017-07-02 ENCOUNTER — Encounter: Payer: Self-pay | Admitting: *Deleted

## 2017-07-02 DIAGNOSIS — Z006 Encounter for examination for normal comparison and control in clinical research program: Secondary | ICD-10-CM

## 2017-07-02 NOTE — Progress Notes (Signed)
I saw patient for Clear Study for month 1 visit. Patient is doing well with no chest pain. He does have bruising on arms probably from Aspirin and Brilinta. He was > 80% compliant with study med. I had patient resign Clear consent but none of the changes affected the patient . The changes were admiinstrative changes with incorrect dates on consent. I explained consent to patient. He verbalized understanding of new consent. I gave patient a copy of new consent.

## 2017-07-05 ENCOUNTER — Other Ambulatory Visit: Payer: Self-pay | Admitting: Nurse Practitioner

## 2017-07-05 NOTE — Telephone Encounter (Signed)
This is Dr. Jordan's pt. °

## 2017-08-05 NOTE — Progress Notes (Signed)
Office Visit    Patient Name: Brandon Gonzales Date of Encounter: 08/08/2017  Primary Care Provider:  [name], NP Primary Cardiologist:  [name], MD   Chief Complaint    66 y/o ? with a h/o CAD, HTN, hypertrophic cm, GERD, and HL, who presents for f/u.  Past Medical History    Past Medical History:  Diagnosis Date  . Arthritis   . Cancer (HCC)    SKIN CA  SQUAMOUS CELL  . Coronary artery disease    a. 2007 Stent to LAD, PTCA D1;  b. DES to LAD & LCx 10/13 in the setting of STEMI.  [name] Kitchen Dyslipidemia    a. Statin and zetia-intolerant. On niacin.  [name] Kitchen GERD (gastroesophageal reflux disease)   . Heart murmur   . Hypertension   . Hypertrophic cardiomyopathy ([place])    a. 09/2012 Echo: EF 55%, mid to apical anterolateral, posterior, apical anterior, apical HK, Gr1 DD, turbulence across LVOT suggesting degree of obstruction, mild MR, mildly dil LA.   Past Surgical History:  Procedure Laterality Date  . CARDIAC CATHETERIZATION  2007   stent to LAD and PTCA of diagonal branch  . CARDIAC CATHETERIZATION  2010  . CORONARY ANGIOPLASTY WITH STENT PLACEMENT  2013   95-99% prox LAD, patent LAD stent distal to this, occluded mid-dist LCx, mild <10% RCA irreg; s/p DES-prox LAD & DES to mid-dist LCx  . CORONARY BALLOON ANGIOPLASTY N/A 05/16/2017   Procedure: Coronary Balloon Angioplasty;  Surgeon: Leonie Man, MD;  Location: [place] [facility];  Service: Cardiovascular;  Laterality: N/A;  . [place]   right  . [place]   left  . LEFT HEART CATH Bilateral 09/22/2012   Procedure: LEFT HEART CATH;  Surgeon: Peter M Martinique, MD;  Location: [facility] CATH LAB;  Service: Cardiovascular;  Laterality: Bilateral;  . LEFT HEART CATH AND CORONARY ANGIOGRAPHY N/A 05/16/2017   Procedure: Left Heart Cath and Coronary Angiography;  Surgeon: Leonie Man, MD;  Location: [place] [facility];  Service: Cardiovascular;  Laterality: N/A;  . PERCUTANEOUS CORONARY STENT INTERVENTION  (PCI-S) Right 09/22/2012   Procedure: PERCUTANEOUS CORONARY STENT INTERVENTION (PCI-S);  Surgeon: Peter M Martinique, MD;  Location: [facility] CATH LAB;  Service: Cardiovascular;  Laterality: Right;    Allergies  Allergies  Allergen Reactions  . Antihistamines, Chlorpheniramine-Type Other (See Comments)    Altered mental status  . Pheniramine Other (See Comments)    Altered mental status  . Statins Other (See Comments)    Leg cramps    . Zetia [Ezetimibe] Other (See Comments)    Leg cramps    History of Present Illness    66 y/o ? with the above complex PMH including CAD prior mid LAD stenting in 2007 with  PTCA to D1, followed by lateral STEMI and PCI/DES to the midLCX & proximalLAD in 2013.  He also has a h/o HTN, HL, GERD, and hypertrophic cardiomyopathy.    He was seen in January 2018 and noted to be hypertensive.  He had been checking his BP @ home and it has been variable - 150's to 190's.  His lisinopril was increased to 20 mg daily.  He called on 3/19 with complaints of dyspnea and dizziness that he states were similar to when he had his MI. He felt very flushed in his face. He was referred to the ED.  States dyspnea was steadily getting worse since January. BP recorded by EMS was 210/107. Came  down to 159/87 in ED.  Ecg showed NSR with a RBBB- new in January 2018. No acute change. Delta troponin was normal. UA, BMET, BNP were all normal. He was discharged on amlodipine 5 mg daily.  He subsequently developed hypotension and amlodipine was discontinued.   He was evaluated with Echo in April showing moderate LVH with severe basal septal hypertrophy. No significant outflow gradient. Mild MR. Myoview study showed a small area of scar at the base of the lateral wall. No ischemia. EF 51%. He presented in June 2018 with symptoms of worsening chest pain and dyspnea on exertion. He underwent cardiac cath showing 95% in stent restenosis of the proximal LAD treated with cutting  balloon angioplasty.  There was a 65% mid RCA stenosis and 50% in the mid LCx. He was enrolled in the CLEAR trial for hyperlipidemia.   On follow up today he is feeling very well. He denies any chest pain or SOB. No palpitations. He is walking 1.5 miles/day. Energy level is good. BP has been running high both at home and on his research visits. He does note some easy bruising on Brilinta.   Home Medications    Allergies as of 08/08/2017      Reactions   Antihistamines, Chlorpheniramine-type Other (See Comments)   Altered mental status   Pheniramine Other (See Comments)   Altered mental status   Statins Other (See Comments)   Leg cramps     Zetia [ezetimibe] Other (See Comments)   Leg cramps      Medication List       Accurate as of 08/08/17 10:10 AM. Always use your most recent med list.          AMBULATORY NON FORMULARY MEDICATION Take 180 mg by mouth daily. Medication Name: bempedoic acid vs a placebo CLEAR Research Study drug provided   aspirin EC 81 MG tablet Take 81 mg by mouth daily.   calcium carbonate 750 MG chewable tablet Commonly known as:  TUMS EX Chew 2 tablets by mouth daily as needed for heartburn.   carvedilol 3.125 MG tablet Commonly known as:  COREG Take 1 tablet (3.125 mg total) by mouth 2 (two) times daily with a meal.   lisinopril 20 MG tablet Commonly known as:  PRINIVIL,ZESTRIL Take 2 tablets (40 mg total) by mouth daily.   NIACIN FLUSH FREE 500 MG Caps Generic drug:  Inositol Niacinate Take 1,000 mg by mouth daily.   nitroGLYCERIN 0.4 MG SL tablet Commonly known as:  NITROSTAT Place 1 tablet (0.4 mg total) under the tongue every 5 (five) minutes as needed for chest pain.   ticagrelor 90 MG Tabs tablet Commonly known as:  BRILINTA Take 1 tablet (90 mg total) by mouth every 12 (twelve) hours.   valACYclovir 500 MG tablet Commonly known as:  VALTREX Take 1 tablet (500 mg total) by mouth daily.            Discharge Care Instructions        Start      Ordered   08/08/17 0000  lisinopril (PRINIVIL,ZESTRIL) 20 MG tablet  Daily     08/08/17 1003      Review of Systems    As noted in HPI.  All other systems reviewed and are otherwise negative except as noted above.  Physical Exam    VS:  BP 140/90   Pulse 60   Ht '5\' 8"'  (1.727 m)   Wt 170 lb (77.1 kg)   BMI 25.85 kg/m  ,  BMI Body mass index is 25.85 kg/m. GEN: Well nourished, well developed, in no acute distress.  HEENT: normal.  Neck: Supple, no JVD, carotid bruits, or masses. Cardiac: RRR, 2/6 syst murmur @ apex radiating to RUSB, no rubs, or gallops. No clubbing, cyanosis, edema.  Radials/DP/PT 2+ and equal bilaterally.  Respiratory:  Respirations regular and unlabored, clear to auscultation bilaterally. GI: Soft, nontender, nondistended, BS + x 4. MS: no deformity or atrophy. Skin: warm and dry, no rash. Neuro:  Strength and sensation are intact. Psych: Normal affect.  Accessory Clinical Findings    Lab Results  Component Value Date   WBC 6.8 05/17/2017   HGB 14.2 05/17/2017   HCT 40.9 05/17/2017   PLT 163 05/17/2017   GLUCOSE 91 05/17/2017   CHOL 163 05/16/2017   TRIG 269 (H) 05/16/2017   HDL 28 (L) 05/16/2017   LDLDIRECT 159.5 10/01/2012   LDLCALC 81 05/16/2017   ALT 20 01/11/2017   AST 21 01/11/2017   NA 139 05/17/2017   K 4.1 05/17/2017   CL 109 05/17/2017   CREATININE 1.22 05/17/2017   BUN 14 05/17/2017   CO2 23 05/17/2017   TSH 2.353 09/22/2012   PSA 1.4 09/13/2010   INR 1.06 05/16/2017   HGBA1C 5.0 05/16/2017     Myoview 03/02/17: Study Highlights     The left ventricular ejection fraction is mildly decreased (45-54%).  Nuclear stress EF: 51%.  Blood pressure demonstrated a hypotensive response to exercise.  There was no ST segment deviation noted during stress.  Defect 1: There is a small defect of moderate severity present in the basal inferolateral and mid inferolateral location.  This is a low risk study.   There is a small  non-reversible perfusion defect in the basal and mid inferolateral walls consistent with prior infarct and ischemia.  There is hypokinesis in the  basal and mid inferolateral walls.    Echo: 05/16/17: Study Conclusions  - Left ventricle: The cavity size was normal. There was severe   hypertrophy of the septum with otherwise mild concentric   hypertrophy consistent with hypertrophic cardiomyopathy. Systolic   function was normal. The estimated ejection fraction was in the   range of 60% to 65%. Wall motion was normal; there were no   regional wall motion abnormalities. Doppler parameters are   consistent with abnormal left ventricular relaxation (grade 1   diastolic dysfunction). Doppler parameters are consistent with   indeterminate ventricular filling pressure. - Aortic valve: Transvalvular velocity was within the normal range.   There was no stenosis. There was mild regurgitation. - Mitral valve: Transvalvular velocity was within the normal range.   There was no evidence for stenosis. There was trivial   regurgitation. - Right ventricle: The cavity size was normal. Wall thickness was   normal. Systolic function was normal. - Tricuspid valve: There was trivial regurgitation. - Pulmonary arteries: Systolic pressure was within the normal   range. PA peak pressure: 13 mm Hg (S). - Global longitudinal strain -18.0%.  Cardiac cath 05/16/17: Procedures   Coronary Balloon Angioplasty  Left Heart Cath and Coronary Angiography  Conclusion     Ost RCA lesion, 40 %stenosed. Mid RCA lesion, 65 %stenosed. Borderline significant.  The left ventricular systolic function is normal.  LV end diastolic pressure is mildly elevated.  There is no mitral valve regurgitation.  There is no aortic valve stenosis.  Prox LAD to Mid LAD bare-metal stent, 20 %stenosed.  Mid LAD lesion, 40 %stenosed. Localized segment of previous bare-metal  stent-  2nd Mrg DES stent, 0 %stenosed.  Prox Cx to  Mid Cx lesion, 40 %stenosed.  Ost LAD lesion, 95 %stenosed.  Post intervention, there is a 10% residual stenosis.   Mr. Palecek has several potential culprit lesions, however the most obvious one in the LAD with 95% stenosis. This is in-stent restenosis, therefore treated with balloon angioplasty using the [facility] Cutting Balloon followed by post-dilation with a noncompliant balloon.  The RCA lesion is of borderline significance, but does not appear to be acute in nature. Would defer intervention on this vessel at this time unless she has ongoing or worsening symptoms.   Plan: Overnight monitoring and likely discharge tomorrow Return to nursing unit for ongoing care and TR been removal. Back on dual antiplatelet therapy.- Reduced aspirin 81 mg him and using Brilinta. Further risk factor modification per rounding team.   He can follow-up with Dr. Martinique, will need to have a follow-up appointment rescheduled as he was post be there tomorrow June 14.    Glenetta Hew, M.D., M.S. Interventional Cardiologist   Pager # [phone] Phone # [phone] [address] [place], [place] [address]    Indications   Coronary artery disease involving native coronary artery of native heart with unstable angina pectoris ([place]) [I25.110 (ICD-10-CM)]  Procedural Details/Technique   Technical Details Primary Physician: [name], NP Primary Cardiologist: Dr. Martinique; consulting radiologist, Dr. Shelva Majestic Requesting Provider: Dr. Leo Grosser Chouinard is a 66 y.o. male with past medical history of CAD (s/p DES to LAD in 2007 with PTCA of D1, DES to LAD and LCx in 2013 in the setting of STEMI), hypertrophic cardiomyopathy, Stage 3 CKD, HTN, and HLD who presented to [facility] with signs and symptoms concerning for unstable angina. Apparently he had been undergoing evaluation with Dr. Martinique and has had continued symptoms despite having a low risk nuclear stress  test. The distorted actually planned to wean the patient in for cardiac catheterization. Unfortunately presented to [facility] emergency room with chest discomfort symptoms concerning for unstable angina.  Time Out: Verified patient identification, verified procedure, site/side was marked, verified correct patient position, special equipment/implants available, medications/allergies/relevent history reviewed, required imaging and test results available. Performed. Consent Signed.   Access:  * RIGHT Radial Artery: 6 Fr sheath -- Seldinger technique using Micropuncture Kit * 10 mL radial cocktail IA; 4500 Units IV Heparin  Left Heart Catheterization: 5 Fr Catheters advanced or exchanged over a J-wire under direct fluoroscopic guidance into the ascending aorta; TIG 4.0 Catheter advanced first.  * Left and Right Coronary Artery Cineangiography: TIG 4.0 Catheter  * LV Hemodynamics (LV Gram - hand injection): Angled Pigtail Catheter  Reviewing angiographic images, the clear culprit lesion is the proximal LAD in-stent restenosis. The RCA does have some moderate severe disease, but is not to the level significance of the LAD. Preparations were made for planned PTCA only of the LAD stent and medical management of his other lesions. Brilinta 100 mg by mouth an additional boluses of IV Heparin administered to achieve an ACT > 300 sec  PCI PERFORMED: See FINDINGS  - After completion of angioplasty, the catheter was removed completely out of the body over wire without complication.  Right Radial Sheath(s) removed in the Cath Lab with TR band placed for hemostasis.   TR Band: 1650 Hours; 12 mL air  MEDICATIONS * SQ Lidocaine 5m * Radial Cocktail: 3 mg Verapamil in 10 mL NS * Isovue Contrast: 170 mL *  Heparin: Total 12,000 Units * IC NTG 200 * Brilinta 100 g 1   Estimated blood loss <50 mL.  During this procedure the patient was administered the following to achieve and maintain moderate  conscious sedation: Versed 2 mg, Fentanyl 25 mcg, while the patient's heart rate, blood pressure, and oxygen saturation were continuously monitored. The period of conscious sedation was 62 minutes, of which I was present face-to-face 100% of this time.    Complications   Complications documented before study signed (05/16/2017 6:13 PM EDT)    No complications were associated with this study.  Documented by Leonie Man, MD - 05/16/2017 5:28 PM EDT    Coronary Findings   Dominance: Right  Left Main  Vessel is large.  Left Anterior Descending  Ost LAD lesion, 95% stenosed. The lesion is eccentric and irregular. The lesion was previously treated using a drug eluting stent over 2 years ago.  Angioplasty: Lesion length: 16 mm. Lesion crossed with guidewire using a WIRE MARVEL. Angioplasty alone was performed using a BALLOON [place] 3.00X15. Maximum pressure: 14 atm. Inflation time: 30 sec. BALLOON EMERGE MR 2.5X12 [phone]) - 2 inflations, 12 Atm 20 seconds - [place] BALLOON [place] EMERGE MR 3.25X15 ([id number]) - 2 inflation x 3 Sec & 16 Atm The pre-interventional distal flow is normal (TIMI 3). The post-interventional distal flow is normal (TIMI 3). The intervention was successful . No complications occurred at this lesion.  There is a 10% residual stenosis post intervention.  Prox LAD to Mid LAD lesion, 20% stenosed. The lesion was previously treated using a bare metal stent over 2 years ago.  Mid LAD lesion, 40% stenosed. The lesion is smooth. The lesion was previously treated. Previously placed stent displays restenosis.  First Septal Branch  Vessel is moderate in size. Starts off as a moderate size trunk with several septal perforator branches  Third Diagonal Branch  Vessel is small in size.  Third Septal Branch  Vessel is small in size.  Ramus Intermedius  Vessel is small. Course is more as a diagonal branch and even potentially more as a septal perforator.  Left Circumflex  Prox Cx to  Mid Cx lesion, 40% stenosed. The lesion is tubular.  First Obtuse Marginal Branch  Vessel is moderate in size.  Second Obtuse Marginal Branch  2nd Mrg lesion, 0% stenosed. Previously placed 2nd Mrg drug eluting stent is widely patent.  Lateral Second Obtuse Marginal Branch  Vessel is small in size.  Right Coronary Artery  Vessel is large.  Ost RCA lesion, 40% stenosed. The lesion is eccentric.  Mid RCA lesion, 65% stenosed. The lesion is eccentric and irregular. Looks borderline significant.  Acute Marginal Branch  Vessel is small in size.  Right Posterior Descending Artery  Vessel is moderate in size.  Inferior Septal  Vessel is small in size.  First Right Posterolateral  Vessel is small in size.  Second Right Posterolateral  Vessel is moderate in size.  Third Right Posterolateral  Vessel is small in size.  Wall Motion              Left Heart   Left Ventricle The left ventricular size is normal. The left ventricular systolic function is normal. LV end diastolic pressure is mildly elevated. No regional wall motion abnormalities. There is no evidence of mitral regurgitation.    Aortic Valve There is no aortic valve stenosis. There is normal aortic valve motion.    Coronary Diagrams   Diagnostic Diagram  Post-Intervention Diagram         Assessment & Plan    1.  Hypertensive Heart Disease:  BP is persistently high.  We will increase lisinopril to 40 mg daily. Limited ability to titrate Coreg due to low resting HR. If additional therapy will start amlodipine 2.5 mg daily.  2.  CAD: s/p prior LAD, D1, and LCX PCI with STEMI in 2013. Admitted in June 2018 with in stent restenosis of the proximal LAD. Treated with cutting balloon PCI. Interestedly his Myoview study prior was normal so may not be a reliable study.  Now on DAPT with ASA and Brilinta. On beta blocker with Coreg. Recommend continued DAPT indefinitely. Consider switching to plavix at 6 months.  3.  HL:    intolerant to statins and zetia.  He is not at goal on niacin. On CLEAR trial now.   4. Hypertrophic CM:  euvolemic on exam.  BP mgmt as above.      Peter Martinique, MD,FACC 08/08/2017, 10:10 AM

## 2017-08-08 ENCOUNTER — Encounter: Payer: Self-pay | Admitting: Cardiology

## 2017-08-08 ENCOUNTER — Ambulatory Visit (INDEPENDENT_AMBULATORY_CARE_PROVIDER_SITE_OTHER): Payer: Medicare HMO | Admitting: Cardiology

## 2017-08-08 VITALS — BP 140/90 | HR 60 | Ht 68.0 in | Wt 170.0 lb

## 2017-08-08 DIAGNOSIS — E785 Hyperlipidemia, unspecified: Secondary | ICD-10-CM | POA: Diagnosis not present

## 2017-08-08 DIAGNOSIS — I10 Essential (primary) hypertension: Secondary | ICD-10-CM | POA: Diagnosis not present

## 2017-08-08 DIAGNOSIS — I25118 Atherosclerotic heart disease of native coronary artery with other forms of angina pectoris: Secondary | ICD-10-CM | POA: Diagnosis not present

## 2017-08-08 DIAGNOSIS — I422 Other hypertrophic cardiomyopathy: Secondary | ICD-10-CM | POA: Diagnosis not present

## 2017-08-08 MED ORDER — LISINOPRIL 20 MG PO TABS: 40.0000 mg | ORAL_TABLET | Freq: Every day | ORAL | 1 refills | Status: DC

## 2017-08-08 NOTE — Patient Instructions (Signed)
Increase lisinopril to 40 mg daily  Continue your other therapy  I will see you in 6 months.

## 2017-08-22 DIAGNOSIS — K625 Hemorrhage of anus and rectum: Secondary | ICD-10-CM | POA: Diagnosis not present

## 2017-08-22 DIAGNOSIS — K5730 Diverticulosis of large intestine without perforation or abscess without bleeding: Secondary | ICD-10-CM | POA: Diagnosis not present

## 2017-08-22 DIAGNOSIS — R198 Other specified symptoms and signs involving the digestive system and abdomen: Secondary | ICD-10-CM | POA: Diagnosis not present

## 2017-09-03 ENCOUNTER — Telehealth: Payer: Self-pay | Admitting: *Deleted

## 2017-09-03 ENCOUNTER — Telehealth: Payer: Self-pay

## 2017-09-03 NOTE — Telephone Encounter (Signed)
Received clearance from [place] cleared patient for upcoming colonoscopy.Advised ok to hold Brilinta on 09/20/17 and restart on 09/29/17.Clearance faxed back to fax # [phone].

## 2017-09-03 NOTE — Telephone Encounter (Signed)
Patient left a msg on the refill vm requesting a call from you in regards to the beta blocker. He stated that he has to go home every day about 2:00 as he gets very tired and feels run down. He can be reached at [phone]. Thanks, MI

## 2017-09-03 NOTE — Telephone Encounter (Signed)
Returned call to patient.He stated for the past 3 to 4 weeks he is extremely tired around 2:00 pm every day.Stated he wanted to ask Dr.Jordan if Coreg would be causing.Spoke to [place] he advised you are on a small dose of Coreg.He advised need to have colonoscopy done first to find out why hgb low.Advised to call back after colonoscopy.

## 2017-09-24 DIAGNOSIS — D485 Neoplasm of uncertain behavior of skin: Secondary | ICD-10-CM | POA: Diagnosis not present

## 2017-09-24 DIAGNOSIS — L570 Actinic keratosis: Secondary | ICD-10-CM | POA: Diagnosis not present

## 2017-09-24 DIAGNOSIS — B354 Tinea corporis: Secondary | ICD-10-CM | POA: Diagnosis not present

## 2017-09-24 DIAGNOSIS — D692 Other nonthrombocytopenic purpura: Secondary | ICD-10-CM | POA: Diagnosis not present

## 2017-09-24 DIAGNOSIS — L812 Freckles: Secondary | ICD-10-CM | POA: Diagnosis not present

## 2017-09-24 DIAGNOSIS — D0321 Melanoma in situ of right ear and external auricular canal: Secondary | ICD-10-CM | POA: Diagnosis not present

## 2017-09-24 DIAGNOSIS — L821 Other seborrheic keratosis: Secondary | ICD-10-CM | POA: Diagnosis not present

## 2017-09-24 DIAGNOSIS — Z85828 Personal history of other malignant neoplasm of skin: Secondary | ICD-10-CM | POA: Diagnosis not present

## 2017-09-26 DIAGNOSIS — K648 Other hemorrhoids: Secondary | ICD-10-CM | POA: Diagnosis not present

## 2017-09-26 DIAGNOSIS — K5730 Diverticulosis of large intestine without perforation or abscess without bleeding: Secondary | ICD-10-CM | POA: Diagnosis not present

## 2017-09-26 DIAGNOSIS — K625 Hemorrhage of anus and rectum: Secondary | ICD-10-CM | POA: Diagnosis not present

## 2017-09-26 LAB — HM COLONOSCOPY

## 2017-10-08 DIAGNOSIS — Z85828 Personal history of other malignant neoplasm of skin: Secondary | ICD-10-CM | POA: Diagnosis not present

## 2017-10-08 DIAGNOSIS — D0321 Melanoma in situ of right ear and external auricular canal: Secondary | ICD-10-CM | POA: Diagnosis not present

## 2017-10-09 DIAGNOSIS — L989 Disorder of the skin and subcutaneous tissue, unspecified: Secondary | ICD-10-CM | POA: Diagnosis not present

## 2017-10-09 DIAGNOSIS — D0321 Melanoma in situ of right ear and external auricular canal: Secondary | ICD-10-CM | POA: Diagnosis not present

## 2017-11-02 ENCOUNTER — Encounter: Payer: Self-pay | Admitting: *Deleted

## 2017-11-02 DIAGNOSIS — Z006 Encounter for examination for normal comparison and control in clinical research program: Secondary | ICD-10-CM

## 2017-11-02 NOTE — Progress Notes (Signed)
Late entry:  Subject to Research clinic for visit T3-M3 for the Clear Research Study. He had no new meds or adverse events to report. He has been 99% compliant with his meds and new meds were dispensed to him. Next appointment has been scheduled for 12/05/2017.

## 2017-11-13 ENCOUNTER — Ambulatory Visit: Payer: Medicare HMO | Admitting: Adult Health

## 2017-11-14 ENCOUNTER — Ambulatory Visit: Payer: Medicare HMO | Admitting: Cardiology

## 2017-11-14 DIAGNOSIS — S5002XA Contusion of left elbow, initial encounter: Secondary | ICD-10-CM | POA: Diagnosis not present

## 2017-11-28 DIAGNOSIS — R011 Cardiac murmur, unspecified: Secondary | ICD-10-CM | POA: Insufficient documentation

## 2017-11-28 DIAGNOSIS — Z8589 Personal history of malignant neoplasm of other organs and systems: Secondary | ICD-10-CM | POA: Insufficient documentation

## 2017-11-28 DIAGNOSIS — K219 Gastro-esophageal reflux disease without esophagitis: Secondary | ICD-10-CM | POA: Insufficient documentation

## 2017-11-28 DIAGNOSIS — C801 Malignant (primary) neoplasm, unspecified: Secondary | ICD-10-CM | POA: Insufficient documentation

## 2017-11-28 DIAGNOSIS — I10 Essential (primary) hypertension: Secondary | ICD-10-CM | POA: Insufficient documentation

## 2017-11-28 DIAGNOSIS — I2510 Atherosclerotic heart disease of native coronary artery without angina pectoris: Secondary | ICD-10-CM | POA: Insufficient documentation

## 2017-12-04 DIAGNOSIS — R69 Illness, unspecified: Secondary | ICD-10-CM | POA: Diagnosis not present

## 2017-12-11 ENCOUNTER — Encounter: Payer: Self-pay | Admitting: *Deleted

## 2017-12-11 DIAGNOSIS — Z006 Encounter for examination for normal comparison and control in clinical research program: Secondary | ICD-10-CM

## 2017-12-11 NOTE — Progress Notes (Addendum)
Subject to research clinic for visit T4-M6 in the Clear Research study.  No c/o today.  No saes, however ae of Melanoma excision right ear to report to sponsor.  95% compliant with meds and new drug dispensed.  F/u phone call and next clinic  Appointment scheduled.  Subject re-consented to [place] version 5.1 28Aug2018 Local version [phone]

## 2017-12-18 NOTE — Progress Notes (Signed)
Office Visit    Patient Name: Brandon Gonzales Date of Encounter: 12/19/2017  Primary Care Provider:  [name], NP Primary Cardiologist:  [name], MD   Chief Complaint    67 y/o ? with a h/o CAD, HTN, hypertrophic cm, GERD, and HL, who presents for f/u.  Past Medical History    Past Medical History:  Diagnosis Date  . Arthritis   . Cancer (HCC)    SKIN CA  SQUAMOUS CELL  . Coronary artery disease    a. 2007 Stent to LAD, PTCA D1;  b. DES to LAD & LCx 10/13 in the setting of STEMI.  [name] Kitchen Dyslipidemia    a. Statin and zetia-intolerant. On niacin.  [name] Kitchen GERD (gastroesophageal reflux disease)   . Heart murmur   . Hypertension   . Hypertrophic cardiomyopathy ([place])    a. 09/2012 Echo: EF 55%, mid to apical anterolateral, posterior, apical anterior, apical HK, Gr1 DD, turbulence across LVOT suggesting degree of obstruction, mild MR, mildly dil LA.   Past Surgical History:  Procedure Laterality Date  . CARDIAC CATHETERIZATION  2007   stent to LAD and PTCA of diagonal branch  . CARDIAC CATHETERIZATION  2010  . CORONARY ANGIOPLASTY WITH STENT PLACEMENT  2013   95-99% prox LAD, patent LAD stent distal to this, occluded mid-dist LCx, mild <10% RCA irreg; s/p DES-prox LAD & DES to mid-dist LCx  . CORONARY BALLOON ANGIOPLASTY N/A 05/16/2017   Procedure: Coronary Balloon Angioplasty;  Surgeon: Brandon Man, MD;  Location: [place] CV LAB;  Service: Cardiovascular;  Laterality: N/A;  . [place]   right  . [place]   left  . LEFT HEART CATH Bilateral 09/22/2012   Procedure: LEFT HEART CATH;  Surgeon: Brandon Swinford M Martinique, MD;  Location: [facility] CATH LAB;  Service: Cardiovascular;  Laterality: Bilateral;  . LEFT HEART CATH AND CORONARY ANGIOGRAPHY N/A 05/16/2017   Procedure: Left Heart Cath and Coronary Angiography;  Surgeon: Brandon Man, MD;  Location: [place] CV LAB;  Service: Cardiovascular;  Laterality: N/A;  . PERCUTANEOUS CORONARY STENT INTERVENTION  (PCI-S) Right 09/22/2012   Procedure: PERCUTANEOUS CORONARY STENT INTERVENTION (PCI-S);  Surgeon: Brandon Blincoe M Martinique, MD;  Location: [facility] CATH LAB;  Service: Cardiovascular;  Laterality: Right;    Allergies  Allergies  Allergen Reactions  . Antihistamines, Chlorpheniramine-Type Other (See Comments)    Altered mental status  . Pheniramine Other (See Comments)    Altered mental status  . Statins Other (See Comments)    Leg cramps    . Zetia [Ezetimibe] Other (See Comments)    Leg cramps    History of Present Illness    67 y/o ? with the above complex PMH including CAD prior mid LAD stenting in 2007 with  PTCA to D1, followed by lateral STEMI and PCI/DES to the midLCX & proximalLAD in 2013.  He also has a h/o HTN, HL, GERD, and hypertrophic cardiomyopathy.    He was seen in January 2018 and noted to be hypertensive.  He had been checking his BP @ home and it has been variable - 150's to 190's.  His lisinopril was increased to 20 mg daily.  He called on 3/19 with complaints of dyspnea and dizziness that he states were similar to when he had his MI. He felt very flushed in his face. He was referred to the ED.  BP recorded by EMS was 210/107. Came down to 159/87 in ED.  Ecg showed  NSR with a RBBB- new in January 2018. No acute change. Delta troponin was normal. UA, BMET, BNP were all normal. He was discharged on amlodipine 5 mg daily.  He subsequently developed hypotension and amlodipine was discontinued.   He was evaluated with Echo in April showing moderate LVH with severe basal septal hypertrophy. No significant outflow gradient. Mild MR. Myoview study showed a small area of scar at the base of the lateral wall. No ischemia. EF 51%. He presented in June 2018 with symptoms of worsening chest pain and dyspnea on exertion. He underwent cardiac cath showing 95% in stent restenosis of the proximal LAD treated with cutting  balloon angioplasty. There was a 65% mid RCA stenosis and 50% in the mid LCx. He  was enrolled in the CLEAR trial for hyperlipidemia.   In October he complained of feeling tired in the afternoon. Underwent colonoscopy that showed some diverticuli but was otherwise benign. He also had a melanoma remover from his right ear and reconstructive surgery.  On follow up today he is feeling very well. He denies any chest pain or SOB. No palpitations. He has not been exercising much over the past 2 months.  Energy level is low in the afternoon. He does note some easy bruising on Brilinta. He has noted some vertigo over the last 2 weeks.   Home Medications    Allergies as of 12/19/2017      Reactions   Antihistamines, Chlorpheniramine-type Other (See Comments)   Altered mental status   Pheniramine Other (See Comments)   Altered mental status   Statins Other (See Comments)   Leg cramps     Zetia [ezetimibe] Other (See Comments)   Leg cramps      Medication List        Accurate as of 12/19/17 10:30 AM. Always use your most recent med list.          AMBULATORY NON FORMULARY MEDICATION Take 180 mg by mouth daily. Medication Name: bempedoic acid vs a placebo CLEAR Research Study drug provided   aspirin EC 81 MG tablet Take 81 mg by mouth daily.   calcium carbonate 750 MG chewable tablet Commonly known as:  TUMS EX Chew 2 tablets by mouth daily as needed for heartburn.   lisinopril 20 MG tablet Commonly known as:  PRINIVIL,ZESTRIL Take 2 tablets (40 mg total) by mouth daily.   NIACIN FLUSH FREE 500 MG Caps Generic drug:  Inositol Niacinate Take 1,000 mg by mouth daily.   nitroGLYCERIN 0.4 MG SL tablet Commonly known as:  NITROSTAT Place 1 tablet (0.4 mg total) under the tongue every 5 (five) minutes as needed for chest pain.   ticagrelor 90 MG Tabs tablet Commonly known as:  BRILINTA Take 1 tablet (90 mg total) by mouth every 12 (twelve) hours.   valACYclovir 500 MG tablet Commonly known as:  VALTREX Take 1 tablet (500 mg total) by mouth daily.        Review of Systems    As noted in HPI.  All other systems reviewed and are otherwise negative except as noted above.  Physical Exam    VS:  BP (!) 150/98   Pulse 72   Ht _0  (1.727 m)   Wt 172 lb (78 kg)   BMI 26.15 kg/m  , BMI Body mass index is 26.15 kg/m. GENERAL:  Well appearing HEENT:  PERRL, EOMI, sclera are clear. Oropharynx is clear. NECK:  No jugular venous distention, carotid upstroke brisk and symmetric, no bruits, no  thyromegaly or adenopathy LUNGS:  Clear to auscultation bilaterally CHEST:  Unremarkable HEART:  RRR,  PMI not displaced or sustained,S1 and S2 within normal limits, no S3, no S4: no clicks, no rubs, gr 2/6 systolic murmur at apex. ABD:  Soft, nontender. BS +, no masses or bruits. No hepatomegaly, no splenomegaly EXT:  2 + pulses throughout, no edema, no cyanosis no clubbing SKIN:  Warm and dry.  No rashes NEURO:  Alert and oriented x 3. Cranial nerves II through XII intact. PSYCH:  Cognitively intact    Accessory Clinical Findings    Lab Results  Component Value Date   WBC 6.8 05/17/2017   HGB 14.2 05/17/2017   HCT 40.9 05/17/2017   PLT 163 05/17/2017   GLUCOSE 91 05/17/2017   CHOL 163 05/16/2017   TRIG 269 (H) 05/16/2017   HDL 28 (L) 05/16/2017   LDLDIRECT 159.5 10/01/2012   LDLCALC 81 05/16/2017   ALT 20 01/11/2017   AST 21 01/11/2017   NA 139 05/17/2017   K 4.1 05/17/2017   CL 109 05/17/2017   CREATININE 1.22 05/17/2017   BUN 14 05/17/2017   CO2 23 05/17/2017   TSH 2.353 09/22/2012   PSA 1.4 09/13/2010   INR 1.06 05/16/2017   HGBA1C 5.0 05/16/2017     Myoview 03/02/17: Study Highlights     The left ventricular ejection fraction is mildly decreased (45-54%).  Nuclear stress EF: 51%.  Blood pressure demonstrated a hypotensive response to exercise.  There was no ST segment deviation noted during stress.  Defect 1: There is a small defect of moderate severity present in the basal inferolateral and mid inferolateral  location.  This is a low risk study.   There is a small non-reversible perfusion defect in the basal and mid inferolateral walls consistent with prior infarct and ischemia.  There is hypokinesis in the  basal and mid inferolateral walls.    Echo: 05/16/17: Study Conclusions  - Left ventricle: The cavity size was normal. There was severe   hypertrophy of the septum with otherwise mild concentric   hypertrophy consistent with hypertrophic cardiomyopathy. Systolic   function was normal. The estimated ejection fraction was in the   range of 60% to 65%. Wall motion was normal; there were no   regional wall motion abnormalities. Doppler parameters are   consistent with abnormal left ventricular relaxation (grade 1   diastolic dysfunction). Doppler parameters are consistent with   indeterminate ventricular filling pressure. - Aortic valve: Transvalvular velocity was within the normal range.   There was no stenosis. There was mild regurgitation. - Mitral valve: Transvalvular velocity was within the normal range.   There was no evidence for stenosis. There was trivial   regurgitation. - Right ventricle: The cavity size was normal. Wall thickness was   normal. Systolic function was normal. - Tricuspid valve: There was trivial regurgitation. - Pulmonary arteries: Systolic pressure was within the normal   range. PA peak pressure: 13 mm Hg (S). - Global longitudinal strain -18.0%.  Cardiac cath 05/16/17: Procedures   Coronary Balloon Angioplasty  Left Heart Cath and Coronary Angiography  Conclusion     Ost RCA lesion, 40 %stenosed. Mid RCA lesion, 65 %stenosed. Borderline significant.  The left ventricular systolic function is normal.  LV end diastolic pressure is mildly elevated.  There is no mitral valve regurgitation.  There is no aortic valve stenosis.  Prox LAD to Mid LAD bare-metal stent, 20 %stenosed.  Mid LAD lesion, 40 %stenosed. Localized segment of previous  bare-metal stent-  2nd Mrg DES stent, 0 %stenosed.  Prox Cx to Mid Cx lesion, 40 %stenosed.  Ost LAD lesion, 95 %stenosed.  Post intervention, there is a 10% residual stenosis.   Mr. Krejci has several potential culprit lesions, however the most obvious one in the LAD with 95% stenosis. This is in-stent restenosis, therefore treated with balloon angioplasty using the [facility] Cutting Balloon followed by post-dilation with a noncompliant balloon.  The RCA lesion is of borderline significance, but does not appear to be acute in nature. Would defer intervention on this vessel at this time unless she has ongoing or worsening symptoms.   Plan: Overnight monitoring and likely discharge tomorrow Return to nursing unit for ongoing care and TR been removal. Back on dual antiplatelet therapy.- Reduced aspirin 81 mg him and using Brilinta. Further risk factor modification per rounding team.   He can follow-up with Dr. Martinique, will need to have a follow-up appointment rescheduled as he was post be there tomorrow June 14.    Brandon Gonzales, M.D., M.S. Interventional Cardiologist   Pager # [phone] Phone # [phone] [address] [place], [place] [address]    Indications   Coronary artery disease involving native coronary artery of native heart with unstable angina pectoris ([place]) [I25.110 (ICD-10-CM)]  Procedural Details/Technique   Technical Details Primary Physician: Brandon Grandchild, NP Primary Cardiologist: Dr. Martinique; consulting radiologist, Dr. Shelva Majestic Requesting Provider: Dr. Leo Grosser Penning is a 67 y.o. male with past medical history of CAD (s/p DES to LAD in 2007 with PTCA of D1, DES to LAD and LCx in 2013 in the setting of STEMI), hypertrophic cardiomyopathy, Stage 3 CKD, HTN, and HLD who presented to [facility] with signs and symptoms concerning for unstable angina. Apparently he had been undergoing evaluation with Dr. Martinique and  has had continued symptoms despite having a low risk nuclear stress test. The distorted actually planned to wean the patient in for cardiac catheterization. Unfortunately presented to [facility] emergency room with chest discomfort symptoms concerning for unstable angina.  Time Out: Verified patient identification, verified procedure, site/side was marked, verified correct patient position, special equipment/implants available, medications/allergies/relevent history reviewed, required imaging and test results available. Performed. Consent Signed.   Access:  * RIGHT Radial Artery: 6 Fr sheath -- Seldinger technique using Micropuncture Kit * 10 mL radial cocktail IA; 4500 Units IV Heparin  Left Heart Catheterization: 5 Fr Catheters advanced or exchanged over a J-wire under direct fluoroscopic guidance into the ascending aorta; TIG 4.0 Catheter advanced first.  * Left and Right Coronary Artery Cineangiography: TIG 4.0 Catheter  * LV Hemodynamics (LV Gram - hand injection): Angled Pigtail Catheter  Reviewing angiographic images, the clear culprit lesion is the proximal LAD in-stent restenosis. The RCA does have some moderate severe disease, but is not to the level significance of the LAD. Preparations were made for planned PTCA only of the LAD stent and medical management of his other lesions. Brilinta 100 mg by mouth an additional boluses of IV Heparin administered to achieve an ACT > 300 sec  PCI PERFORMED: See FINDINGS  - After completion of angioplasty, the catheter was removed completely out of the body over wire without complication.  Right Radial Sheath(s) removed in the Cath Lab with TR band placed for hemostasis.   TR Band: 1650 Hours; 12 mL air  MEDICATIONS * SQ Lidocaine 16m * Radial Cocktail: 3 mg Verapamil in 10 mL NS * Isovue Contrast: 170 mL *  Heparin: Total 12,000 Units * IC NTG 200 * Brilinta 100 g 1   Estimated blood loss <50 mL.  During this procedure the patient  was administered the following to achieve and maintain moderate conscious sedation: Versed 2 mg, Fentanyl 25 mcg, while the patient's heart rate, blood pressure, and oxygen saturation were continuously monitored. The period of conscious sedation was 62 minutes, of which I was present face-to-face 100% of this time.    Complications   Complications documented before study signed (05/16/2017 6:13 PM EDT)    No complications were associated with this study.  Documented by Brandon Man, MD - 05/16/2017 5:28 PM EDT    Coronary Findings   Dominance: Right  Left Main  Vessel is large.  Left Anterior Descending  Ost LAD lesion, 95% stenosed. The lesion is eccentric and irregular. The lesion was previously treated using a drug eluting stent over 2 years ago.  Angioplasty: Lesion length: 16 mm. Lesion crossed with guidewire using a WIRE MARVEL. Angioplasty alone was performed using a BALLOON [place] 3.00X15. Maximum pressure: 14 atm. Inflation time: 30 sec. BALLOON EMERGE MR 2.5X12 [phone]) - 2 inflations, 12 Atm 20 seconds - [place] BALLOON [place] EMERGE MR 3.25X15 (01027) - 2 inflation x 3 Sec & 16 Atm The pre-interventional distal flow is normal (TIMI 3). The post-interventional distal flow is normal (TIMI 3). The intervention was successful . No complications occurred at this lesion.  There is a 10% residual stenosis post intervention.  Prox LAD to Mid LAD lesion, 20% stenosed. The lesion was previously treated using a bare metal stent over 2 years ago.  Mid LAD lesion, 40% stenosed. The lesion is smooth. The lesion was previously treated. Previously placed stent displays restenosis.  First Septal Branch  Vessel is moderate in size. Starts off as a moderate size trunk with several septal perforator branches  Third Diagonal Branch  Vessel is small in size.  Third Septal Branch  Vessel is small in size.  Ramus Intermedius  Vessel is small. Course is more as a diagonal branch and even  potentially more as a septal perforator.  Left Circumflex  Prox Cx to Mid Cx lesion, 40% stenosed. The lesion is tubular.  First Obtuse Marginal Branch  Vessel is moderate in size.  Second Obtuse Marginal Branch  2nd Mrg lesion, 0% stenosed. Previously placed 2nd Mrg drug eluting stent is widely patent.  Lateral Second Obtuse Marginal Branch  Vessel is small in size.  Right Coronary Artery  Vessel is large.  Ost RCA lesion, 40% stenosed. The lesion is eccentric.  Mid RCA lesion, 65% stenosed. The lesion is eccentric and irregular. Looks borderline significant.  Acute Marginal Branch  Vessel is small in size.  Right Posterior Descending Artery  Vessel is moderate in size.  Inferior Septal  Vessel is small in size.  First Right Posterolateral  Vessel is small in size.  Second Right Posterolateral  Vessel is moderate in size.  Third Right Posterolateral  Vessel is small in size.  Wall Motion              Left Heart   Left Ventricle The left ventricular size is normal. The left ventricular systolic function is normal. LV end diastolic pressure is mildly elevated. No regional wall motion abnormalities. There is no evidence of mitral regurgitation.    Aortic Valve There is no aortic valve stenosis. There is normal aortic valve motion.    Coronary Diagrams   Diagnostic Diagram  Post-Intervention Diagram         Assessment & Plan    1.  Hypertensive Heart Disease:  BP is persistently high.  We will increase Coreg to 6.25 mg bid. Will monitor HR response.  If he needs  additional therapy will start amlodipine 2.5 mg daily. He has CKD so I would try and avoid a diuretic.  2.  CAD: s/p prior LAD, D1, and LCX PCI with STEMI in 2013. Admitted in June 2018 with in stent restenosis of the proximal LAD. Symptoms predominantly of DOE. Treated with cutting balloon PCI. Interestedly his Myoview study prior was normal so may not be a reliable study.  Now on DAPT with ASA and  Brilinta. On beta blocker with Coreg. Recommend continued DAPT indefinitely. Consider switching to plavix at 6 months.  3.  HL:   intolerant to statins and zetia. On CLEAR trial now.   4. Hypertrophic CM:  euvolemic on exam.  BP mgmt as above.      Jaskiran Pata Martinique, MD,FACC 12/19/2017, 10:30 AM

## 2017-12-19 ENCOUNTER — Ambulatory Visit: Payer: Medicare HMO | Admitting: Cardiology

## 2017-12-19 ENCOUNTER — Encounter: Payer: Self-pay | Admitting: Cardiology

## 2017-12-19 ENCOUNTER — Other Ambulatory Visit: Payer: Self-pay | Admitting: Nurse Practitioner

## 2017-12-19 VITALS — BP 150/98 | HR 72 | Ht 68.0 in | Wt 172.0 lb

## 2017-12-19 DIAGNOSIS — I10 Essential (primary) hypertension: Secondary | ICD-10-CM | POA: Diagnosis not present

## 2017-12-19 DIAGNOSIS — E785 Hyperlipidemia, unspecified: Secondary | ICD-10-CM | POA: Diagnosis not present

## 2017-12-19 DIAGNOSIS — I25118 Atherosclerotic heart disease of native coronary artery with other forms of angina pectoris: Secondary | ICD-10-CM

## 2017-12-19 DIAGNOSIS — N183 Chronic kidney disease, stage 3 unspecified: Secondary | ICD-10-CM

## 2017-12-19 MED ORDER — CARVEDILOL 6.25 MG PO TABS: 6.2500 mg | ORAL_TABLET | Freq: Two times a day (BID) | ORAL | 3 refills | Status: DC

## 2017-12-19 NOTE — Addendum Note (Signed)
Addended by: Kathyrn Lass on: 12/19/2017 10:53 AM   Modules accepted: Orders

## 2017-12-19 NOTE — Telephone Encounter (Signed)
This is Dr. Jordan's pt. °

## 2017-12-19 NOTE — Patient Instructions (Addendum)
Increase Coreg to 6.25 mg twice a day  Continue your other medication.  I will see you in 6 months with lab work

## 2018-01-01 ENCOUNTER — Other Ambulatory Visit: Payer: Self-pay

## 2018-01-01 MED ORDER — CARVEDILOL 6.25 MG PO TABS: 6.2500 mg | ORAL_TABLET | Freq: Two times a day (BID) | ORAL | 3 refills | Status: DC

## 2018-01-01 NOTE — Telephone Encounter (Signed)
Rx(s) sent to pharmacy electronically.  

## 2018-01-02 ENCOUNTER — Other Ambulatory Visit: Payer: Self-pay | Admitting: Cardiology

## 2018-01-21 ENCOUNTER — Telehealth: Payer: Self-pay | Admitting: Cardiology

## 2018-01-21 NOTE — Telephone Encounter (Signed)
Pt c/o BP issue: STAT if pt c/o blurred vision, one-sided weakness or slurred speech  1. What are your last 5 BP readings? 171/95  2. Are you having any other symptoms (ex. Dizziness, headache, blurred vision, passed out)? Headache and vertigo   3. What is your BP issue? High

## 2018-01-21 NOTE — Telephone Encounter (Signed)
Spoke with pt, he reports his bp is consistently going up. He will develop a headache and feels off balance when his bp is this elevated. He is currently taking lisinopril 20 mg twice daily and the carvedilol but does not know the strength of the coreg. As per dr Doug Sou note the patient may need amlodipine to help with bp. The patient reports he has the amlodipine 5 mg tablets at home. Will forward to dr Martinique to advise on amlodipine dosage.

## 2018-01-22 MED ORDER — AMLODIPINE BESYLATE 2.5 MG PO TABS: 2.5000 mg | ORAL_TABLET | Freq: Every day | ORAL | 6 refills | Status: DC

## 2018-01-22 NOTE — Telephone Encounter (Signed)
I would start amlodipine 2.5 mg daily and follow BP  Peter Martinique MD, [facility]

## 2018-01-22 NOTE — Telephone Encounter (Signed)
Returned call to patient no answer.LMTC. 

## 2018-01-22 NOTE — Telephone Encounter (Signed)
Received call from patient Dr.Jordan's recommendations given.Advised to monitor B/P and call back if elevated.

## 2018-01-24 ENCOUNTER — Telehealth: Payer: Self-pay

## 2018-01-24 MED ORDER — CARVEDILOL 6.25 MG PO TABS: 6.2500 mg | ORAL_TABLET | Freq: Two times a day (BID) | ORAL | 3 refills | Status: DC

## 2018-01-24 NOTE — Telephone Encounter (Signed)
Spoke patient.We received a fax from [facility] saying you are taking coreg 6.25 mg 2 tablets twice a day.Patient stated he is taking 3.125 mg 2 tablets twice a day.Advised I will send in 6.25 mg twice a day to pharmacy.

## 2018-01-25 ENCOUNTER — Telehealth: Payer: Self-pay | Admitting: Adult Health

## 2018-01-25 ENCOUNTER — Other Ambulatory Visit: Payer: Self-pay | Admitting: Adult Health

## 2018-01-25 DIAGNOSIS — B001 Herpesviral vesicular dermatitis: Secondary | ICD-10-CM

## 2018-01-25 NOTE — Telephone Encounter (Signed)
Patient called to request Rx refill on:  valACYclovir (VALTREX) 500 MG tablet [[id number]]  Order Details  Dose: 500 mg Route: Oral Frequency: Daily  Dispense Quantity: 90 tablet Refills: 3 Fills remaining: --        Sig: Take 1 tablet (500 mg total) by mouth daily.  Patient taking differently: Take 500-1,000 mg by mouth See admin instructions. Take as needed for fever blisters - Take 2 tablets (1000 mg) by mouth twice daily for 3-4 days, then take 1 tablet (500 mg) twice daily for 7 days, then stop.     ---Advised unsure if OV to be required before refill granted--- forwarding message to medical assistant to f/u with provider & contact pt if OV required.  --glh

## 2018-01-28 NOTE — Telephone Encounter (Signed)
Opened in error. T. Nelson, CMA 

## 2018-01-30 ENCOUNTER — Telehealth: Payer: Self-pay | Admitting: Cardiology

## 2018-01-30 NOTE — Telephone Encounter (Signed)
Returned call to patient of Dr. Martinique who is calling back to follow up on his BP - he is calling for [facility]. He reports his BP is still not regulated, it is all over the place.   161/109 this AM when he woke up  He has had a low grade headache for 4-5 days.   He has his BP readings stored in his BP machine, but this is at home. Recommended that he call back with readings or send to MD via MyChart for review.   Will forward to [place], LPN & Dr. Martinique

## 2018-01-30 NOTE — Telephone Encounter (Signed)
New message ° °Pt verbalized that he is returning call for RN °

## 2018-01-30 NOTE — Progress Notes (Signed)
Subjective:    Patient ID: Brandon Gonzales, male    DOB: [birth year], 67 y.o.   MRN: [id number]  HPI:  Brandon Gonzales originally established [name] 2018, he was found to be sig hypertensive and started on Lisinopril 10mg  and he was then seen by his Cardiologist and dosage increased to 20mg . He was seen by CMA multiple times for BP checks in winter 2018 , however refused to be seen by provider to adjust antihypertensives. 02/19/17 ED for CP: troponin neg, he declined admission for stress test and started on Amlodipine 5mg  QD 02/21/18 Cards referred him to Lipid clinic 03/02/17 Mycocardial Perfusion Study- The left ventricular ejection fraction is mildly decreased (45-54%).  Nuclear stress EF: 51%.  Blood pressure demonstrated a hypotensive response to exercise.  There was no ST segment deviation noted during stress.  Defect 1: There is a small defect of moderate severity present in the  basal inferolateral and mid inferolateral location.  This is a low risk study.  There is a small non-reversible perfusion defect in the basal and mid  inferolateral walls consistent with prior infarct and ischemia.  There is hypokinesis in the basal and mid inferolateral walls.  04/18/17- He enrolled in [facility] program  05/16/17- ED for CP- Chest pain in a patient with history of CAD and multiple stents in the past. He was admitted with unstable angina and placed on IV heparin and nitro drip, troponin remained negative for seen by cardiology and underwent left heart cath on 05/16/2017 see report below, basically he had over 90% in-stent stenosis of his LAD requiring angioplasty by Dr. Ellyn Hack, subsequently placed on aspirin and beta Reino Bellis along with beta blocker, now symptom-free cleared by cardiac you for discharge with outpatient follow-up with PCP and his general cardiologist Dr Martinique. We are applying thirty-day Brilinta card thereafter per PCP and cardiology. 2.Dyslipidemia. Allergic to statin and Zetia,  defer management to cardiology. 3.Hypertension. On lisinopril have stopped Norvasc and placed him on Coreg instead. 4.CKD3 baseline creatinine of around 1.2-1.3, he is at baseline.  08/08/17- Cards; increased Lisinopril to 40mg  QD and mentioned switching to plavix   12/19/17- Cards; lncreasedCoreg to 6.25 mg bid. Will monitor HR response.  If he needs  additional therapy will start amlodipine 2.5 mg daily. He has CKD so I would try and avoid a diuretic. Again considered switching to plavix at 6 months.  01/22/18- pt reported elevated BP with HA/imblance issues.  He was started on Amlodipine 2.5mg  QD  He is here today for f/u-  He reports taking Amlodipine 2.5mg  and 5mg  for total 7.5mg  QD.  He called Dr. Martinique yesterday with home BP reading 161/109 and was instructed to send MD MyChart with readings- he has not.  Today he denies CP/dyspnea/palpitations, however he continues to have frontal HA and dizziness.  Weather permitting he will walk 1.5 miles, 4 days/week He follows DASH diet and continues to avoid tobacco/ETOH.   Patient Care Team    Relationship Specialty Notifications Start End  Mina Marble D, NP PCP - General Family Medicine  12/14/16   Martinique, Peter M, MD PCP - Cardiology Cardiology Admissions 12/14/17   Jarome Matin, MD Consulting Physician Dermatology  12/14/16   Melida Quitter, [place] Physician Otolaryngology  12/14/16     Patient Active Problem List   Diagnosis Date Noted  . Herpes labialis 01/31/2018  . Healthcare maintenance 01/31/2018  . Hypertension   . Heart murmur   . GERD (gastroesophageal reflux disease)   . Coronary artery  disease   . Cancer ([place])   . Angina at rest [facility]) 05/17/2017  . Hypertrophic cardiomyopathy ([place])   . S/P PTCA (percutaneous transluminal coronary angioplasty)   . Chest pain 05/16/2017  . Unstable angina ([place])   . Dyspnea on exertion 02/21/2017  . Neck pain 12/28/2016  . NSVT (nonsustained ventricular tachycardia) ([place])  09/25/2012  . Hypertrophic obstructive cardiomyopathy(425.11) 09/25/2012  . CKD (chronic kidney disease) stage 3, GFR 30-59 ml/min (HCC) 09/25/2012  . ST elevation myocardial infarction (STEMI) of lateral wall ([place]) 09/22/2012  . CAD (coronary artery disease) 01/11/2012  . HTN (hypertension) 01/11/2012  . Dyslipidemia   . H/O gastroesophageal reflux (GERD)   . Arthritis      Past Medical History:  Diagnosis Date  . Arthritis   . Cancer (HCC)    SKIN CA  SQUAMOUS CELL  . Coronary artery disease    a. 2007 Stent to LAD, PTCA D1;  b. DES to LAD & LCx 10/13 in the setting of STEMI.  [name] Kitchen Dyslipidemia    a. Statin and zetia-intolerant. On niacin.  [name] Kitchen GERD (gastroesophageal reflux disease)   . Heart murmur   . Hypertension   . Hypertrophic cardiomyopathy ([place])    a. 09/2012 Echo: EF 55%, mid to apical anterolateral, posterior, apical anterior, apical HK, Gr1 DD, turbulence across LVOT suggesting degree of obstruction, mild MR, mildly dil LA.     Past Surgical History:  Procedure Laterality Date  . CARDIAC CATHETERIZATION  2007   stent to LAD and PTCA of diagonal branch  . CARDIAC CATHETERIZATION  2010  . CORONARY ANGIOPLASTY WITH STENT PLACEMENT  2013   95-99% prox LAD, patent LAD stent distal to this, occluded mid-dist LCx, mild <10% RCA irreg; s/p DES-prox LAD & DES to mid-dist LCx  . CORONARY BALLOON ANGIOPLASTY N/A 05/16/2017   Procedure: Coronary Balloon Angioplasty;  Surgeon: Leonie Man, MD;  Location: [facility] CV LAB;  Service: Cardiovascular;  Laterality: N/A;  . [place]   right  . [place]   left  . LEFT HEART CATH Bilateral 09/22/2012   Procedure: LEFT HEART CATH;  Surgeon: Peter M Martinique, MD;  Location: [facility] CATH LAB;  Service: Cardiovascular;  Laterality: Bilateral;  . LEFT HEART CATH AND CORONARY ANGIOGRAPHY N/A 05/16/2017   Procedure: Left Heart Cath and Coronary Angiography;  Surgeon: Leonie Man, MD;  Location: [facility] CV LAB;   Service: Cardiovascular;  Laterality: N/A;  . PERCUTANEOUS CORONARY STENT INTERVENTION (PCI-S) Right 09/22/2012   Procedure: PERCUTANEOUS CORONARY STENT INTERVENTION (PCI-S);  Surgeon: Peter M Martinique, MD;  Location: [facility] CATH LAB;  Service: Cardiovascular;  Laterality: Right;     Family History  Problem Relation Age of Onset  . Heart attack Mother 38  . Heart disease Mother      Social History   Substance and Sexual Activity  Drug Use No     Social History   Substance and Sexual Activity  Alcohol Use No     Social History   Tobacco Use  Smoking Status Never Smoker  Smokeless Tobacco Never Used     Outpatient Encounter Medications as of 01/31/2018  Medication Sig  . AMBULATORY NON FORMULARY MEDICATION Take 180 mg by mouth daily. Medication Name: bempedoic acid vs a placebo CLEAR Research Study drug provided  . amLODipine (NORVASC) 2.5 MG tablet Take 2.5 mg by mouth daily.  [name] Kitchen aspirin EC 81 MG tablet Take 81 mg by mouth daily.  . calcium carbonate (TUMS EX)  750 MG chewable tablet Chew 2 tablets by mouth daily as needed for heartburn.  . carvedilol (COREG) 6.25 MG tablet Take 1 tablet (6.25 mg total) by mouth 2 (two) times daily.  . Inositol Niacinate (NIACIN FLUSH FREE) 500 MG CAPS Take 1,000 mg by mouth daily.  [name] Kitchen lisinopril (PRINIVIL,ZESTRIL) 20 MG tablet Take 20 mg by mouth 2 (two) times daily.  . ticagrelor (BRILINTA) 90 MG TABS tablet Take 1 tablet (90 mg total) by mouth every 12 (twelve) hours.  . valACYclovir (VALTREX) 500 MG tablet Take 1 tablet (500 mg total) by mouth daily.  . [DISCONTINUED] valACYclovir (VALTREX) 500 MG tablet TAKE 1 TABLET (500 MG TOTAL) BY MOUTH DAILY.  . nitroGLYCERIN (NITROSTAT) 0.4 MG SL tablet Place 1 tablet (0.4 mg total) under the tongue every 5 (five) minutes as needed for chest pain.  . [DISCONTINUED] amLODipine (NORVASC) 2.5 MG tablet Take 1 tablet (2.5 mg total) by mouth daily.  . [DISCONTINUED] amoxicillin-clavulanate (AUGMENTIN)  875-125 MG tablet Take 1 tablet by mouth 2 (two) times daily.  . [DISCONTINUED] lisinopril (PRINIVIL,ZESTRIL) 20 MG tablet Take 2 tablets (40 mg total) by mouth daily.  . [DISCONTINUED] lisinopril (PRINIVIL,ZESTRIL) 20 MG tablet TAKE 1 TABLET BY MOUTH EVERY DAY   No facility-administered encounter medications on file as of 01/31/2018.     Allergies: Antihistamines, chlorpheniramine-type; Pheniramine; Statins; and Zetia [ezetimibe]  Body mass index is 26.33 kg/m.  Blood pressure 130/78, pulse 75, height 5\' 8"  (1.727 m), weight 173 lb 3.2 oz (78.6 kg), SpO2 95 %.   Review of Systems  Constitutional: Positive for fatigue. Negative for activity change, appetite change, chills, diaphoresis, fever and unexpected weight change.  Eyes: Negative for visual disturbance.  Respiratory: Negative for cough, chest tightness, shortness of breath, wheezing and stridor.   Cardiovascular: Negative for chest pain, palpitations and leg swelling.  Endocrine: Negative for cold intolerance, heat intolerance, polydipsia, polyphagia and polyuria.  Skin: Negative for color change, pallor and wound.  Psychiatric/Behavioral: Negative for dysphoric mood, hallucinations, self-injury, sleep disturbance and suicidal ideas. The patient is not nervous/anxious and is not hyperactive.        Objective:   Physical Exam  Constitutional: He is oriented to person, place, and time. He appears well-developed and well-nourished. No distress.  Cardiovascular: Normal rate, regular rhythm and intact distal pulses.  Murmur heard. Pulmonary/Chest: Effort normal. No respiratory distress. He has no wheezes. He has no rales. He exhibits no tenderness.  Neurological: He is alert and oriented to person, place, and time.  Skin: Skin is warm and dry. No rash noted. He is not diaphoretic. No erythema. No pallor.  Psychiatric: He has a normal mood and affect. His behavior is normal. Judgment and thought content normal.           Assessment & Plan:   1. Secondary hypertension   2. Gastroesophageal reflux disease, esophagitis presence not specified   3. Herpes labialis   4. Healthcare maintenance   5. Dyslipidemia     Healthcare maintenance Please take antihypertensives as directed by cardiologist, reviewed at length today, call your cardiologist with any questions. Please call your cardiologist with elevated blood pressure readings or acute cardiac symptoms. Continue regular walking regime and DASH diet. Please schedule complete physical with fasting labs in 4 weeks. Will ask Environmental education officer about checking BP several times week here and sending results to cardiology for management.  Hypertension BP at goal 130/78, HR 75 BP checked in LUE, sitting Home machine reading 148/97, HR 74- however he  checked BP in LUE immediately after CMA checked it with clinic [employer]. Discussed that the second reading was probably erroneously elevated. Continue to check BP/HR daily and call cards with elevated readings or acute cardiac sx's. Continue DASH diet and regular walking.  Herpes labialis Refill on Valtrex provided  Pt was in the office today for 40+ minutes, with over 50% time spent in face to face counseling of various medical concerns and in coordination of care  FOLLOW-UP:  Return in about 4 weeks (around 02/28/2018) for Fasting Labs, CPE.

## 2018-01-31 ENCOUNTER — Encounter: Payer: Self-pay | Admitting: Adult Health

## 2018-01-31 ENCOUNTER — Ambulatory Visit (INDEPENDENT_AMBULATORY_CARE_PROVIDER_SITE_OTHER): Payer: Medicare HMO | Admitting: Adult Health

## 2018-01-31 ENCOUNTER — Other Ambulatory Visit: Payer: Self-pay | Admitting: Adult Health

## 2018-01-31 VITALS — BP 130/78 | HR 75 | Ht 68.0 in | Wt 173.2 lb

## 2018-01-31 DIAGNOSIS — I159 Secondary hypertension, unspecified: Secondary | ICD-10-CM

## 2018-01-31 DIAGNOSIS — E785 Hyperlipidemia, unspecified: Secondary | ICD-10-CM | POA: Diagnosis not present

## 2018-01-31 DIAGNOSIS — B001 Herpesviral vesicular dermatitis: Secondary | ICD-10-CM | POA: Insufficient documentation

## 2018-01-31 DIAGNOSIS — Z0000 Encounter for general adult medical examination without abnormal findings: Secondary | ICD-10-CM | POA: Insufficient documentation

## 2018-01-31 DIAGNOSIS — K219 Gastro-esophageal reflux disease without esophagitis: Secondary | ICD-10-CM | POA: Diagnosis not present

## 2018-01-31 MED ORDER — VALACYCLOVIR HCL 500 MG PO TABS: 500.0000 mg | ORAL_TABLET | Freq: Every day | ORAL | 2 refills | Status: DC

## 2018-01-31 MED ORDER — AMOXICILLIN-POT CLAVULANATE 875-125 MG PO TABS: 1.0000 | ORAL_TABLET | Freq: Two times a day (BID) | ORAL | 0 refills | Status: DC

## 2018-01-31 NOTE — Patient Instructions (Signed)
Hypertension Hypertension, commonly called high blood pressure, is when the force of blood pumping through the arteries is too strong. The arteries are the blood vessels that carry blood from the heart throughout the body. Hypertension forces the heart to work harder to pump blood and may cause arteries to become narrow or stiff. Having untreated or uncontrolled hypertension can cause heart attacks, strokes, kidney disease, and other problems. A blood pressure reading consists of a higher number over a lower number. Ideally, your blood pressure should be below 120/80. The first ("top") number is called the systolic pressure. It is a measure of the pressure in your arteries as your heart beats. The second ("bottom") number is called the diastolic pressure. It is a measure of the pressure in your arteries as the heart relaxes. What are the causes? The cause of this condition is not known. What increases the risk? Some risk factors for high blood pressure are under your control. Others are not. Factors you can change  Smoking.  Having type 2 diabetes mellitus, high cholesterol, or both.  Not getting enough exercise or physical activity.  Being overweight.  Having too much fat, sugar, calories, or salt (sodium) in your diet.  Drinking too much alcohol. Factors that are difficult or impossible to change  Having chronic kidney disease.  Having a family history of high blood pressure.  Age. Risk increases with age.  Race. You may be at higher risk if you are African-American.  Gender. Men are at higher risk than women before age 45. After age 65, women are at higher risk than men.  Having obstructive sleep apnea.  Stress. What are the signs or symptoms? Extremely high blood pressure (hypertensive crisis) may cause:  Headache.  Anxiety.  Shortness of breath.  Nosebleed.  Nausea and vomiting.  Severe chest pain.  Jerky movements you cannot control (seizures).  How is this  diagnosed? This condition is diagnosed by measuring your blood pressure while you are seated, with your arm resting on a surface. The cuff of the blood pressure monitor will be placed directly against the skin of your upper arm at the level of your heart. It should be measured at least twice using the same arm. Certain conditions can cause a difference in blood pressure between your right and left arms. Certain factors can cause blood pressure readings to be lower or higher than normal (elevated) for a short period of time:  When your blood pressure is higher when you are in a health care provider's office than when you are at home, this is called white coat hypertension. Most people with this condition do not need medicines.  When your blood pressure is higher at home than when you are in a health care provider's office, this is called masked hypertension. Most people with this condition may need medicines to control blood pressure.  If you have a high blood pressure reading during one visit or you have normal blood pressure with other risk factors:  You may be asked to return on a different day to have your blood pressure checked again.  You may be asked to monitor your blood pressure at home for 1 week or longer.  If you are diagnosed with hypertension, you may have other blood or imaging tests to help your health care provider understand your overall risk for other conditions. How is this treated? This condition is treated by making healthy lifestyle changes, such as eating healthy foods, exercising more, and reducing your alcohol intake. Your   health care provider may prescribe medicine if lifestyle changes are not enough to get your blood pressure under control, and if:  Your systolic blood pressure is above 130.  Your diastolic blood pressure is above 80.  Your personal target blood pressure may vary depending on your medical conditions, your age, and other factors. Follow these  instructions at home: Eating and drinking  Eat a diet that is high in fiber and potassium, and low in sodium, added sugar, and fat. An example eating plan is called the DASH (Dietary Approaches to Stop Hypertension) diet. To eat this way: ? Eat plenty of fresh fruits and vegetables. Try to fill half of your plate at each meal with fruits and vegetables. ? Eat whole grains, such as whole wheat pasta, brown rice, or whole grain bread. Fill about one quarter of your plate with whole grains. ? Eat or drink low-fat dairy products, such as skim milk or low-fat yogurt. ? Avoid fatty cuts of meat, processed or cured meats, and poultry with skin. Fill about one quarter of your plate with lean proteins, such as fish, chicken without skin, beans, eggs, and tofu. ? Avoid premade and processed foods. These tend to be higher in sodium, added sugar, and fat.  Reduce your daily sodium intake. Most people with hypertension should eat less than 1,500 mg of sodium a day.  Limit alcohol intake to no more than 1 drink a day for nonpregnant women and 2 drinks a day for men. One drink equals 12 oz of beer, 5 oz of wine, or 1 oz of hard liquor. Lifestyle  Work with your health care provider to maintain a healthy body weight or to lose weight. Ask what an ideal weight is for you.  Get at least 30 minutes of exercise that causes your heart to beat faster (aerobic exercise) most days of the week. Activities may include walking, swimming, or biking.  Include exercise to strengthen your muscles (resistance exercise), such as pilates or lifting weights, as part of your weekly exercise routine. Try to do these types of exercises for 30 minutes at least 3 days a week.  Do not use any products that contain nicotine or tobacco, such as cigarettes and e-cigarettes. If you need help quitting, ask your health care provider.  Monitor your blood pressure at home as told by your health care provider.  Keep all follow-up visits as  told by your health care provider. This is important. Medicines  Take over-the-counter and prescription medicines only as told by your health care provider. Follow directions carefully. Blood pressure medicines must be taken as prescribed.  Do not skip doses of blood pressure medicine. Doing this puts you at risk for problems and can make the medicine less effective.  Ask your health care provider about side effects or reactions to medicines that you should watch for. Contact a health care provider if:  You think you are having a reaction to a medicine you are taking.  You have headaches that keep coming back (recurring).  You feel dizzy.  You have swelling in your ankles.  You have trouble with your vision. Get help right away if:  You develop a severe headache or confusion.  You have unusual weakness or numbness.  You feel faint.  You have severe pain in your chest or abdomen.  You vomit repeatedly.  You have trouble breathing. Summary  Hypertension is when the force of blood pumping through your arteries is too strong. If this condition is not   controlled, it may put you at risk for serious complications.  Your personal target blood pressure may vary depending on your medical conditions, your age, and other factors. For most people, a normal blood pressure is less than 120/80.  Hypertension is treated with lifestyle changes, medicines, or a combination of both. Lifestyle changes include weight loss, eating a healthy, low-sodium diet, exercising more, and limiting alcohol. This information is not intended to replace advice given to you by your health care provider. Make sure you discuss any questions you have with your health care provider. Document Released: 11/20/2005 Document Revised: 10/18/2016 Document Reviewed: 10/18/2016 Elsevier Interactive Patient Education  2018 [employer] DASH Eating Plan DASH stands for "Dietary Approaches to Stop Hypertension." The DASH  eating plan is a healthy eating plan that has been shown to reduce high blood pressure (hypertension). It may also reduce your risk for type 2 diabetes, heart disease, and stroke. The DASH eating plan may also help with weight loss. What are tips for following this plan? General guidelines  Avoid eating more than 2,300 mg (milligrams) of salt (sodium) a day. If you have hypertension, you may need to reduce your sodium intake to 1,500 mg a day.  Limit alcohol intake to no more than 1 drink a day for nonpregnant women and 2 drinks a day for men. One drink equals 12 oz of beer, 5 oz of wine, or 1 oz of hard liquor.  Work with your health care provider to maintain a healthy body weight or to lose weight. Ask what an ideal weight is for you.  Get at least 30 minutes of exercise that causes your heart to beat faster (aerobic exercise) most days of the week. Activities may include walking, swimming, or biking.  Work with your health care provider or diet and nutrition specialist (dietitian) to adjust your eating plan to your individual calorie needs. Reading food labels  Check food labels for the amount of sodium per serving. Choose foods with less than 5 percent of the Daily Value of sodium. Generally, foods with less than 300 mg of sodium per serving fit into this eating plan.  To find whole grains, look for the word "whole" as the first word in the ingredient list. Shopping  Buy products labeled as "low-sodium" or "no salt added."  Buy fresh foods. Avoid canned foods and premade or frozen meals. Cooking  Avoid adding salt when cooking. Use salt-free seasonings or herbs instead of table salt or sea salt. Check with your health care provider or pharmacist before using salt substitutes.  Do not fry foods. Cook foods using healthy methods such as baking, boiling, grilling, and broiling instead.  Cook with heart-healthy oils, such as olive, canola, soybean, or sunflower oil. Meal  planning   Eat a balanced diet that includes: ? 5 or more servings of fruits and vegetables each day. At each meal, try to fill half of your plate with fruits and vegetables. ? Up to 6-8 servings of whole grains each day. ? Less than 6 oz of lean meat, poultry, or fish each day. A 3-oz serving of meat is about the same size as a deck of cards. One egg equals 1 oz. ? 2 servings of low-fat dairy each day. ? A serving of nuts, seeds, or beans 5 times each week. ? Heart-healthy fats. Healthy fats called Omega-3 fatty acids are found in foods such as flaxseeds and coldwater fish, like sardines, salmon, and mackerel.  Limit how much you eat of the   following: ? Canned or prepackaged foods. ? Food that is high in trans fat, such as fried foods. ? Food that is high in saturated fat, such as fatty meat. ? Sweets, desserts, sugary drinks, and other foods with added sugar. ? Full-fat dairy products.  Do not salt foods before eating.  Try to eat at least 2 vegetarian meals each week.  Eat more home-cooked food and less restaurant, buffet, and fast food.  When eating at a restaurant, ask that your food be prepared with less salt or no salt, if possible. What foods are recommended? The items listed may not be a complete list. Talk with your dietitian about what dietary choices are best for you. Grains Whole-grain or whole-wheat bread. Whole-grain or whole-wheat pasta. Brown rice. Oatmeal. Quinoa. Bulgur. Whole-grain and low-sodium cereals. Pita bread. Low-fat, low-sodium crackers. Whole-wheat flour tortillas. Vegetables Fresh or frozen vegetables (raw, steamed, roasted, or grilled). Low-sodium or reduced-sodium tomato and vegetable juice. Low-sodium or reduced-sodium tomato sauce and tomato paste. Low-sodium or reduced-sodium canned vegetables. Fruits All fresh, dried, or frozen fruit. Canned fruit in natural juice (without added sugar). Meat and other protein foods Skinless chicken or turkey.  Ground chicken or turkey. Pork with fat trimmed off. Fish and seafood. Egg whites. Dried beans, peas, or lentils. Unsalted nuts, nut butters, and seeds. Unsalted canned beans. Lean cuts of beef with fat trimmed off. Low-sodium, lean deli meat. Dairy Low-fat (1%) or fat-free (skim) milk. Fat-free, low-fat, or reduced-fat cheeses. Nonfat, low-sodium ricotta or cottage cheese. Low-fat or nonfat yogurt. Low-fat, low-sodium cheese. Fats and oils Soft margarine without trans fats. Vegetable oil. Low-fat, reduced-fat, or light mayonnaise and salad dressings (reduced-sodium). Canola, safflower, olive, soybean, and sunflower oils. Avocado. Seasoning and other foods Herbs. Spices. Seasoning mixes without salt. Unsalted popcorn and pretzels. Fat-free sweets. What foods are not recommended? The items listed may not be a complete list. Talk with your dietitian about what dietary choices are best for you. Grains Baked goods made with fat, such as croissants, muffins, or some breads. Dry pasta or rice meal packs. Vegetables Creamed or fried vegetables. Vegetables in a cheese sauce. Regular canned vegetables (not low-sodium or reduced-sodium). Regular canned tomato sauce and paste (not low-sodium or reduced-sodium). Regular tomato and vegetable juice (not low-sodium or reduced-sodium). Pickles. Olives. Fruits Canned fruit in a light or heavy syrup. Fried fruit. Fruit in cream or butter sauce. Meat and other protein foods Fatty cuts of meat. Ribs. Fried meat. Bacon. Sausage. Bologna and other processed lunch meats. Salami. Fatback. Hotdogs. Bratwurst. Salted nuts and seeds. Canned beans with added salt. Canned or smoked fish. Whole eggs or egg yolks. Chicken or turkey with skin. Dairy Whole or 2% milk, cream, and half-and-half. Whole or full-fat cream cheese. Whole-fat or sweetened yogurt. Full-fat cheese. Nondairy creamers. Whipped toppings. Processed cheese and cheese spreads. Fats and oils Butter. Stick  margarine. Lard. Shortening. Ghee. Bacon fat. Tropical oils, such as coconut, palm kernel, or palm oil. Seasoning and other foods Salted popcorn and pretzels. Onion salt, garlic salt, seasoned salt, table salt, and sea salt. Worcestershire sauce. Tartar sauce. Barbecue sauce. Teriyaki sauce. Soy sauce, including reduced-sodium. Steak sauce. Canned and packaged gravies. Fish sauce. Oyster sauce. Cocktail sauce. Horseradish that you find on the shelf. Ketchup. Mustard. Meat flavorings and tenderizers. Bouillon cubes. Hot sauce and Tabasco sauce. Premade or packaged marinades. Premade or packaged taco seasonings. Relishes. Regular salad dressings. Where to find more information:  National Heart, Lung, and Blood Institute: www.nhlbi.nih.gov  American Heart Association: www.heart.org   Summary  The DASH eating plan is a healthy eating plan that has been shown to reduce high blood pressure (hypertension). It may also reduce your risk for type 2 diabetes, heart disease, and stroke.  With the DASH eating plan, you should limit salt (sodium) intake to 2,300 mg a day. If you have hypertension, you may need to reduce your sodium intake to 1,500 mg a day.  When on the DASH eating plan, aim to eat more fresh fruits and vegetables, whole grains, lean proteins, low-fat dairy, and heart-healthy fats.  Work with your health care provider or diet and nutrition specialist (dietitian) to adjust your eating plan to your individual calorie needs. This information is not intended to replace advice given to you by your health care provider. Make sure you discuss any questions you have with your health care provider. Document Released: 11/09/2011 Document Revised: 11/13/2016 Document Reviewed: 11/13/2016 Elsevier Interactive Patient Education  [employer].  Please take antihypertensives as directed by cardiologist, reviewed at length today, call your cardiologist with any questions. Please call your cardiologist  with elevated blood pressure readings or acute cardiac symptoms. Continue regular walking regime and DASH diet. Please schedule complete physical with fasting labs in 4 weeks. Will ask Environmental education officer about checking BP several times week here and sending results to cardiology for management. NICE TO SEE YOU!

## 2018-01-31 NOTE — Assessment & Plan Note (Signed)
Please take antihypertensives as directed by cardiologist, reviewed at length today, call your cardiologist with any questions. Please call your cardiologist with elevated blood pressure readings or acute cardiac symptoms. Continue regular walking regime and DASH diet. Please schedule complete physical with fasting labs in 4 weeks. Will ask Environmental education officer about checking BP several times week here and sending results to cardiology for management.

## 2018-01-31 NOTE — Assessment & Plan Note (Signed)
Refill on Valtrex provided

## 2018-01-31 NOTE — Assessment & Plan Note (Addendum)
BP at goal 130/78, HR 75 BP checked in LUE, sitting Home machine reading 148/97, HR 74- however he checked BP in LUE immediately after CMA checked it with clinic [facility]. Discussed that the second reading was probably erroneously elevated. Continue to check BP/HR daily and call cards with elevated readings or acute cardiac sx's. Continue DASH diet and regular walking.

## 2018-02-01 ENCOUNTER — Encounter: Payer: Self-pay | Admitting: Adult Health

## 2018-02-05 NOTE — Telephone Encounter (Signed)
Returned call to patient.He stated he stopped taking amlodipine and carvedilol 2 days ago.He takes lisinopril 20 mg every am.He feels a lot better,no headache.B/P 130/82 pulse 56.He has stopped eating sugar.He is walking everyday.He is scheduled to have physical with PCP this month and lab work scheduled 02/22/18.Message sent to [place].He wants Dr.Jordan to order additional lab if needed.He does not know what labs are being done.

## 2018-02-05 NOTE — Telephone Encounter (Signed)
Follow up   Pt verbalized that he is trying to reach the nurse to discuss his bp adjustment for his med Pt dropped back to 20 mg of lisinopril stopped taking beta blocker and amlodipine and bp stopped fluctuating running average of 130/82 Pt c/o medication issue:  1. Name of Medication: Amlopipine, beta blocker, and lisinopril  2. How are you currently taking this medication (dosage and times per day)?   3. Are you having a reaction (difficulty breathing--STAT)? no  4. What is your medication issue? Pt wants to let the nurse know that he dropped back to 20 mg of his lisinopril, stopped taking his beta blocker and amlodipine. His bp stopped fluctuating and running average of 130/82

## 2018-02-07 NOTE — Telephone Encounter (Signed)
Labs ordered by primary care look appropriate. Continue to monitor BP.  Peter Martinique MD, [facility]

## 2018-02-07 NOTE — Telephone Encounter (Signed)
Returned call to patient Dr.Jordan's recommendations given.Stated he feels light headed today B/P 138/86 pulse 66.Stated he feels like he did when he had inner ear.Advised to call PCP.

## 2018-02-09 ENCOUNTER — Telehealth: Payer: Self-pay | Admitting: Internal Medicine

## 2018-02-09 NOTE — Telephone Encounter (Signed)
Cardiology Moonlighter Note  Returned call from patient. Concerned about his blood pressure. Took BP today, was 180/110. Feeling "prickly feelings in his face." Took his medication as directed this morning. Took another dose of lisinopril and amlodipine (total lisinopril 20mg  BID and amlodipine 2.5 once). Stopped taking carvedilol not long ago due to lightheadedness and feeling like "a zombie."   I counseled the patient regarding the importance of a beta blocker, not only for blood pressure management but also given his prior acute coronary syndrome and known residual coronary disease. The patient believes that the symptoms he experienced when taking carvedilol did not change much when he discontinued it, thus he is willing to retry. He will start carvedilol 6.25mg  BID tonight in addition to his other antihypertensive medications.   Regarding the "prickly" sensation in the patient's face, he described this as bilateral around the temple area. No other areas of the face or body were affected. He denies numbness, tingling, weakness, visual disturbances, syncope, presyncope. I offered him reassurance about his current symptoms and recommended that he call back if he develops any new symptoms tonight or if his systolic blood pressure remains consistently above 166mmHg.   Marcie Mowers, MD Cardiology Fellow, PGY-5

## 2018-02-13 ENCOUNTER — Telehealth: Payer: Self-pay | Admitting: Cardiology

## 2018-02-13 NOTE — Telephone Encounter (Signed)
Follow up    Patient returning nurse call

## 2018-02-13 NOTE — Telephone Encounter (Signed)
New Message:     Pt says his blood pressure is still running high,last reading today 182/108 and pulse rate 84. He needs something for this,he already taking 2 medicine.

## 2018-02-14 ENCOUNTER — Other Ambulatory Visit: Payer: Self-pay | Admitting: Cardiology

## 2018-02-14 NOTE — Telephone Encounter (Signed)
Returned call to patient he stated he would like appointment to be seen.He continues to feel bad,no energy.Stated he called Dr. On call last Sat 02/09/18 he started him back on medications, lisinopril 20 mg twice a day,amlodipine 2.5 mg daily,coreg 6.25 mg twice a day.B/P still elevated as listed below.Appointment scheduled with Dr.Jordan 3/15 at 4:30 pm.

## 2018-02-14 NOTE — Telephone Encounter (Signed)
Follow up    Pt c/o BP issue:  1. What are your last 5 BP readings? 182/110 2. Are you having any other symptoms (ex. Dizziness, headache, blurred vision, passed out)? Light headed  3. What is your medication issue? None

## 2018-02-14 NOTE — Progress Notes (Signed)
Office Visit    Patient Name: Brandon Gonzales Date of Encounter: 02/14/2018  Primary Care Provider:  [name], NP Primary Cardiologist:  [name], MD   Chief Complaint    67 y/o ? with a h/o CAD, HTN, hypertrophic cm, GERD, and HL, who presents for f/u of HTN.  Past Medical History    Past Medical History:  Diagnosis Date  . Arthritis   . Cancer (HCC)    SKIN CA  SQUAMOUS CELL  . Coronary artery disease    a. 2007 Stent to LAD, PTCA D1;  b. DES to LAD & LCx 10/13 in the setting of STEMI.  [name] Kitchen Dyslipidemia    a. Statin and zetia-intolerant. On niacin.  [name] Kitchen GERD (gastroesophageal reflux disease)   . Heart murmur   . Hypertension   . Hypertrophic cardiomyopathy ([place])    a. 09/2012 Echo: EF 55%, mid to apical anterolateral, posterior, apical anterior, apical HK, Gr1 DD, turbulence across LVOT suggesting degree of obstruction, mild MR, mildly dil LA.   Past Surgical History:  Procedure Laterality Date  . CARDIAC CATHETERIZATION  2007   stent to LAD and PTCA of diagonal branch  . CARDIAC CATHETERIZATION  2010  . CORONARY ANGIOPLASTY WITH STENT PLACEMENT  2013   95-99% prox LAD, patent LAD stent distal to this, occluded mid-dist LCx, mild <10% RCA irreg; s/p DES-prox LAD & DES to mid-dist LCx  . CORONARY BALLOON ANGIOPLASTY N/A 05/16/2017   Procedure: Coronary Balloon Angioplasty;  Surgeon: Leonie Man, MD;  Location: [place] [facility];  Service: Cardiovascular;  Laterality: N/A;  . [place]   right  . [place]   left  . LEFT HEART CATH Bilateral 09/22/2012   Procedure: LEFT HEART CATH;  Surgeon: Phuong Hillary M Martinique, MD;  Location: [facility] CATH LAB;  Service: Cardiovascular;  Laterality: Bilateral;  . LEFT HEART CATH AND CORONARY ANGIOGRAPHY N/A 05/16/2017   Procedure: Left Heart Cath and Coronary Angiography;  Surgeon: Leonie Man, MD;  Location: [place] [facility];  Service: Cardiovascular;  Laterality: N/A;  . PERCUTANEOUS CORONARY STENT  INTERVENTION (PCI-S) Right 09/22/2012   Procedure: PERCUTANEOUS CORONARY STENT INTERVENTION (PCI-S);  Surgeon: Ayonna Speranza M Martinique, MD;  Location: [facility] CATH LAB;  Service: Cardiovascular;  Laterality: Right;    Allergies  Allergies  Allergen Reactions  . Antihistamines, Chlorpheniramine-Type Other (See Comments)    Altered mental status  . Pheniramine Other (See Comments)    Altered mental status  . Statins Other (See Comments)    Leg cramps    . Zetia [Ezetimibe] Other (See Comments)    Leg cramps    History of Present Illness    67 y/o ? with the above complex PMH including CAD prior mid LAD stenting in 2007 with  PTCA to D1, followed by lateral STEMI and PCI/DES to the midLCX & proximalLAD in 2013.  He also has a h/o HTN, HL, GERD, and hypertrophic cardiomyopathy.    He was seen in January 2018 and noted to be hypertensive.  He had been checking his BP @ home and it has been variable - 150's to 190's.  His lisinopril was increased to 20 mg daily.  He called on 3/19 with complaints of dyspnea and dizziness that he states were similar to when he had his MI. He felt very flushed in his face. He was referred to the ED.  BP recorded by EMS was 210/107. Came down to 159/87 in ED.  Ecg showed NSR with a RBBB- new in January 2018. No acute change. Delta troponin was normal. UA, BMET, BNP were all normal. He was discharged on amlodipine 5 mg daily.  He subsequently developed hypotension and amlodipine was discontinued.   He was evaluated with Echo in April 2018 showing moderate LVH with severe basal septal hypertrophy. No significant outflow gradient. Mild MR. Myoview study showed a small area of scar at the base of the lateral wall. No ischemia. EF 51%. He presented in June 2018 with symptoms of worsening chest pain and dyspnea on exertion. He underwent cardiac cath showing 95% in stent restenosis of the proximal LAD treated with cutting  balloon angioplasty. There was a 65% mid RCA stenosis and 50% in  the mid LCx. He was enrolled in the CLEAR trial for hyperlipidemia given history of statin intolerance.   In October he complained of feeling tired in the afternoon. Underwent colonoscopy that showed some diverticuli but was otherwise benign. He also had a melanoma removed from his right ear and reconstructive surgery.  On his last visit we increased his Coreg to 6.25 mg bid for persistent Htn. Later called for continued elevated BP. Started on amlodipine 2.5 mg daily. When seen at end of February noted BP 130/78. Later on March 9 called complaining of high BP. Talked to fellow on call.  Noted he quit taking Coreg and amlodipine on his own due to feeling like a Zombie. complained on numbness in his head and neck and tingling. BP went up to 190/100.  Advised to resume Coreg. Amlodipine also resumed at lower dose of 2.5 mg daily.  On follow up today he is feeling much better. Zombie feeling resolved. Very anxious about his BP and is checking it 4 times a day.  He denies any chest pain or SOB. No palpitations but sometimes on BP monitor he has a skipped beat.   Home Medications    Allergies as of 02/15/2018      Reactions   Antihistamines, Chlorpheniramine-type Other (See Comments)   Altered mental status   Pheniramine Other (See Comments)   Altered mental status   Statins Other (See Comments)   Leg cramps     Zetia [ezetimibe] Other (See Comments)   Leg cramps      Medication List        Accurate as of 02/14/18  4:00 PM. Always use your most recent med list.          AMBULATORY NON FORMULARY MEDICATION Take 180 mg by mouth daily. Medication Name: bempedoic acid vs a placebo CLEAR Research Study drug provided   amLODipine 2.5 MG tablet Commonly known as:  NORVASC Take 2.5 mg by mouth daily.   aspirin EC 81 MG tablet Take 81 mg by mouth daily.   calcium carbonate 750 MG chewable tablet Commonly known as:  TUMS EX Chew 2 tablets by mouth daily as needed for heartburn.     carvedilol 6.25 MG tablet Commonly known as:  COREG Take 1 tablet (6.25 mg total) by mouth 2 (two) times daily.   lisinopril 20 MG tablet Commonly known as:  PRINIVIL,ZESTRIL Take 20 mg by mouth 2 (two) times daily.   NIACIN FLUSH FREE 500 MG Caps Generic drug:  Inositol Niacinate Take 1,000 mg by mouth daily.   nitroGLYCERIN 0.4 MG SL tablet Commonly known as:  NITROSTAT Place 1 tablet (0.4 mg total) under the tongue every 5 (five) minutes as needed for chest pain.   ticagrelor 90 MG Tabs  tablet Commonly known as:  BRILINTA Take 1 tablet (90 mg total) by mouth every 12 (twelve) hours.   valACYclovir 500 MG tablet Commonly known as:  VALTREX Take 1 tablet (500 mg total) by mouth daily.       Review of Systems    As noted in HPI.  All other systems reviewed and are otherwise negative except as noted above.  Physical Exam    VS:  There were no vitals taken for this visit. , BMI There is no height or weight on file to calculate BMI. GENERAL:  Well appearing WM in NAD HEENT:  PERRL, EOMI, sclera are clear. Oropharynx is clear. NECK:  No jugular venous distention, carotid upstroke brisk and symmetric, no bruits, no thyromegaly or adenopathy LUNGS:  Clear to auscultation bilaterally CHEST:  Unremarkable HEART:  RRR,  PMI not displaced or sustained,S1 and S2 within normal limits, no S3, no S4: no clicks, no rubs, no murmurs ABD:  Soft, nontender. BS +, no masses or bruits. No hepatomegaly, no splenomegaly EXT:  2 + pulses throughout, no edema, no cyanosis no clubbing SKIN:  Warm and dry.  No rashes NEURO:  Alert and oriented x 3. Cranial nerves II through XII intact. PSYCH:  Cognitively intact       Accessory Clinical Findings    Lab Results  Component Value Date   WBC 6.8 05/17/2017   HGB 14.2 05/17/2017   HCT 40.9 05/17/2017   PLT 163 05/17/2017   GLUCOSE 91 05/17/2017   CHOL 163 05/16/2017   TRIG 269 (H) 05/16/2017   HDL 28 (L) 05/16/2017   LDLDIRECT 159.5  10/01/2012   LDLCALC 81 05/16/2017   ALT 20 01/11/2017   AST 21 01/11/2017   NA 139 05/17/2017   K 4.1 05/17/2017   CL 109 05/17/2017   CREATININE 1.22 05/17/2017   BUN 14 05/17/2017   CO2 23 05/17/2017   TSH 2.353 09/22/2012   PSA 1.4 09/13/2010   INR 1.06 05/16/2017   HGBA1C 5.0 05/16/2017     Assessment & Plan    1.  Hypertensive Heart Disease:  BP is well controlled today with resumption of his medication.   He has CKD so I would try and avoid a diuretic. He is compulsive about taking his BP. I have recommended he take his BP only once a day. He is instructed not to change his medication without checking with [place] first.   2.  CAD: s/p prior LAD, D1, and LCX PCI with STEMI in 2013. Admitted in June 2018 with in stent restenosis of the proximal LAD. Symptoms predominantly of DOE. Treated with cutting balloon PCI. Interestedly his Myoview study prior was normal so may not be a reliable study.  Now on DAPT with ASA and Brilinta. On beta blocker with Coreg. Recommend continued DAPT indefinitely. Consider switching to plavix at 6 months.  3.  HL:   intolerant to statins and zetia. On CLEAR trial now. Is scheduled for follow up lab work with the study.  4. Hypertrophic CM:  euvolemic on exam.  BP mgmt as above.      Karine Garn Martinique, MD,FACC 02/14/2018, 4:00 PM

## 2018-02-15 ENCOUNTER — Ambulatory Visit: Payer: Medicare HMO | Admitting: Cardiology

## 2018-02-15 ENCOUNTER — Encounter: Payer: Self-pay | Admitting: Cardiology

## 2018-02-15 VITALS — BP 136/84 | HR 79 | Ht 68.5 in | Wt 171.0 lb

## 2018-02-15 DIAGNOSIS — I10 Essential (primary) hypertension: Secondary | ICD-10-CM

## 2018-02-15 DIAGNOSIS — E785 Hyperlipidemia, unspecified: Secondary | ICD-10-CM | POA: Diagnosis not present

## 2018-02-15 DIAGNOSIS — I25118 Atherosclerotic heart disease of native coronary artery with other forms of angina pectoris: Secondary | ICD-10-CM | POA: Diagnosis not present

## 2018-02-15 DIAGNOSIS — N183 Chronic kidney disease, stage 3 unspecified: Secondary | ICD-10-CM

## 2018-02-15 NOTE — Patient Instructions (Addendum)
Continue your current therapy  We will see you in 4 months

## 2018-02-22 ENCOUNTER — Other Ambulatory Visit: Payer: Medicare HMO

## 2018-02-22 DIAGNOSIS — E785 Hyperlipidemia, unspecified: Secondary | ICD-10-CM

## 2018-02-22 DIAGNOSIS — R739 Hyperglycemia, unspecified: Secondary | ICD-10-CM | POA: Diagnosis not present

## 2018-02-22 DIAGNOSIS — I10 Essential (primary) hypertension: Secondary | ICD-10-CM

## 2018-02-22 DIAGNOSIS — Z0000 Encounter for general adult medical examination without abnormal findings: Secondary | ICD-10-CM | POA: Diagnosis not present

## 2018-02-22 DIAGNOSIS — N183 Chronic kidney disease, stage 3 (moderate): Secondary | ICD-10-CM | POA: Diagnosis not present

## 2018-02-22 DIAGNOSIS — I2510 Atherosclerotic heart disease of native coronary artery without angina pectoris: Secondary | ICD-10-CM | POA: Diagnosis not present

## 2018-02-22 DIAGNOSIS — R748 Abnormal levels of other serum enzymes: Secondary | ICD-10-CM | POA: Diagnosis not present

## 2018-02-23 LAB — LIPID PANEL
Chol/HDL Ratio: 5.9 ratio — ABNORMAL HIGH (ref 0.0–5.0)
Cholesterol, Total: 183 mg/dL (ref 100–199)
HDL: 31 mg/dL — ABNORMAL LOW (ref >39)
LDL Calculated: 128 mg/dL — ABNORMAL HIGH (ref 0–99)
Triglycerides: 122 mg/dL (ref 0–149)
VLDL CHOLESTEROL CAL: 24 mg/dL (ref 5–40)

## 2018-02-23 LAB — COMPREHENSIVE METABOLIC PANEL
A/G RATIO: 2.6 — AB (ref 1.2–2.2)
ALBUMIN: 4.4 g/dL (ref 3.6–4.8)
ALK PHOS: 41 IU/L (ref 39–117)
ALT: 21 IU/L (ref 0–44)
AST: 19 IU/L (ref 0–40)
BUN / CREAT RATIO: 15 (ref 10–24)
BUN: 19 mg/dL (ref 8–27)
Bilirubin Total: 2.1 mg/dL — ABNORMAL HIGH (ref 0.0–1.2)
CO2: 23 mmol/L (ref 20–29)
CREATININE: 1.28 mg/dL — AB (ref 0.76–1.27)
Calcium: 9.8 mg/dL (ref 8.6–10.2)
Chloride: 105 mmol/L (ref 96–106)
GFR calc Af Amer: 66 mL/min/{1.73_m2} (ref >59)
GFR calc non Af Amer: 57 mL/min/{1.73_m2} — ABNORMAL LOW (ref >59)
GLOBULIN, TOTAL: 1.7 g/dL (ref 1.5–4.5)
Glucose: 101 mg/dL — ABNORMAL HIGH (ref 65–99)
Potassium: 4.6 mmol/L (ref 3.5–5.2)
SODIUM: 142 mmol/L (ref 134–144)
Total Protein: 6.1 g/dL (ref 6.0–8.5)

## 2018-02-23 LAB — TSH: TSH: 4.42 u[IU]/mL (ref 0.450–4.500)

## 2018-02-23 LAB — CBC WITH DIFFERENTIAL/PLATELET
BASOS: 0 %
Basophils Absolute: 0 10*3/uL (ref 0.0–0.2)
EOS (ABSOLUTE): 0.1 10*3/uL (ref 0.0–0.4)
EOS: 2 %
HEMATOCRIT: 43.5 % (ref 37.5–51.0)
HEMOGLOBIN: 15.9 g/dL (ref 13.0–17.7)
IMMATURE GRANULOCYTES: 0 %
Immature Grans (Abs): 0 10*3/uL (ref 0.0–0.1)
LYMPHS ABS: 1 10*3/uL (ref 0.7–3.1)
Lymphs: 18 %
MCH: 32.4 pg (ref 26.6–33.0)
MCHC: 36.6 g/dL — AB (ref 31.5–35.7)
MCV: 89 fL (ref 79–97)
MONOS ABS: 0.5 10*3/uL (ref 0.1–0.9)
Monocytes: 9 %
NEUTROS ABS: 3.9 10*3/uL (ref 1.4–7.0)
Neutrophils: 71 %
Platelets: 181 10*3/uL (ref 150–379)
RBC: 4.9 x10E6/uL (ref 4.14–5.80)
RDW: 13.1 % (ref 12.3–15.4)
WBC: 5.5 10*3/uL (ref 3.4–10.8)

## 2018-02-23 LAB — HEMOGLOBIN A1C
Est. average glucose Bld gHb Est-mCnc: 97 mg/dL
HEMOGLOBIN A1C: 5 % (ref 4.8–5.6)

## 2018-02-25 NOTE — Progress Notes (Signed)
Subjective:    Patient ID: Brandon Gonzales, male    DOB: 03-24-1951, 67 y.o.   MRN: [id number]  HPI: 01/31/18 OV:  Brandon Gonzales originally established [name] 2018, he was found to be sig hypertensive and started on Lisinopril 10mg  and he was then seen by his Cardiologist and dosage increased to 20mg . He was seen by CMA multiple times for BP checks in winter 2018 , however refused to be seen by provider to adjust antihypertensives. 02/19/17 ED for CP: troponin neg, he declined admission for stress test and started on Amlodipine 5mg  QD 02/21/18 Cards referred him to Lipid clinic 03/02/17 Mycocardial Perfusion Study- The left ventricular ejection fraction is mildly decreased (45-54%).  Nuclear stress EF: 51%.  Blood pressure demonstrated a hypotensive response to exercise.  There was no ST segment deviation noted during stress.  Defect 1: There is a small defect of moderate severity present in the  basal inferolateral and mid inferolateral location.  This is a low risk study.  There is a small non-reversible perfusion defect in the basal and mid  inferolateral walls consistent with prior infarct and ischemia.  There is hypokinesis in the basal and mid inferolateral walls.  04/18/17- He enrolled in [place] program  05/16/17- ED for CP- Chest pain in a patient with history of CAD and multiple stents in the past. He was admitted with unstable angina and placed on IV heparin and nitro drip, troponin remained negative for seen by cardiology and underwent left heart cath on 05/16/2017 see report below, basically he had over 90% in-stent stenosis of his LAD requiring angioplasty by Dr. Ellyn Hack, subsequently placed on aspirin and beta Reino Bellis along with beta blocker, now symptom-free cleared by cardiac you for discharge with outpatient follow-up with PCP and his general cardiologist Dr Martinique. We are applying thirty-day Brilinta card thereafter per PCP and cardiology. 2.Dyslipidemia. Allergic to statin  and Zetia, defer management to cardiology. 3.Hypertension. On lisinopril have stopped Norvasc and placed him on Coreg instead. 4.CKD3 baseline creatinine of around 1.2-1.3, he is at baseline.  08/08/17- Cards; increased Lisinopril to 40mg  QD and mentioned switching to plavix   12/19/17- Cards; lncreasedCoreg to 6.25 mg bid. Will monitor HR response.  If he needs  additional therapy will start amlodipine 2.5 mg daily. He has CKD so I would try and avoid a diuretic. Again considered switching to plavix at 6 months.  01/22/18- pt reported elevated BP with HA/imblance issues.  He was started on Amlodipine 2.5mg  QD  He is here today for f/u-  He reports taking Amlodipine 2.5mg  and 5mg  for total 7.5mg  QD.  He called Dr. Martinique yesterday with home BP reading 161/109 and was instructed to send MD MyChart with readings- he has not.  Today he denies CP/dyspnea/palpitations, however he continues to have frontal HA and dizziness.  Weather permitting he will walk 1.5 miles, 4 days/week He follows DASH diet and continues to avoid tobacco/ETOH.  02/26/18 OV: Brandon Gonzales presents for CPE 02/15/18- Cards visit, no adjustment to his BP meds and he was encouraged only to check BP once daily and call cards with concerns. He denies any acute changes or current complaints today since last OV three weeks ago. Reviewed recent labs at length- Lipid panel much improved- he has been in CLEAR program for 6 months- reports taking daily dose of Bempedoic Acid ? mg CKD stage 3- stable He follows heart healthy diet and walks 1-1.5 miles 4 days/week. He reports BP has stabilized, home readings SBP 120-130,  DBP 70-80s He denies CP/dyspnea/palpitations.  Healthcare Maintenance: Colonoscopy- last completed 10/2017; neg, repeat in 10 years Hep C screening- completed   Patient Care Team    Relationship Specialty Notifications Start End  Mina Marble D, NP PCP - General Family Medicine  12/14/16   Martinique, Peter M, MD  PCP - Cardiology Cardiology Admissions 12/14/17   Jarome Matin, MD Consulting Physician Dermatology  12/14/16   Melida Quitter, [place] Physician Otolaryngology  12/14/16     Patient Active Problem List   Diagnosis Date Noted  . Herpes labialis 01/31/2018  . Healthcare maintenance 01/31/2018  . Hypertension   . Heart murmur   . GERD (gastroesophageal reflux disease)   . Coronary artery disease   . Cancer ([place])   . Angina at rest [facility]) 05/17/2017  . Hypertrophic cardiomyopathy ([place])   . S/P PTCA (percutaneous transluminal coronary angioplasty)   . Chest pain 05/16/2017  . Unstable angina ([place])   . Dyspnea on exertion 02/21/2017  . Neck pain 12/28/2016  . NSVT (nonsustained ventricular tachycardia) ([place]) 09/25/2012  . Hypertrophic obstructive cardiomyopathy(425.[address]) 09/25/2012  . CKD (chronic kidney disease) stage 3, GFR 30-59 ml/min (HCC) 09/25/2012  . ST elevation myocardial infarction (STEMI) of lateral wall ([place]) 09/22/2012  . CAD (coronary artery disease) 01/11/2012  . HTN (hypertension) 01/11/2012  . Dyslipidemia   . H/O gastroesophageal reflux (GERD)   . Arthritis      Past Medical History:  Diagnosis Date  . Arthritis   . Cancer (HCC)    SKIN CA  SQUAMOUS CELL  . Coronary artery disease    a. 2007 Stent to LAD, PTCA D1;  b. DES to LAD & LCx 10/13 in the setting of STEMI.  [name] Kitchen Dyslipidemia    a. Statin and zetia-intolerant. On niacin.  [name] Kitchen GERD (gastroesophageal reflux disease)   . Heart murmur   . Hypertension   . Hypertrophic cardiomyopathy ([place])    a. 09/2012 Echo: EF 55%, mid to apical anterolateral, posterior, apical anterior, apical HK, Gr1 DD, turbulence across LVOT suggesting degree of obstruction, mild MR, mildly dil LA.     Past Surgical History:  Procedure Laterality Date  . CARDIAC CATHETERIZATION  2007   stent to LAD and PTCA of diagonal branch  . CARDIAC CATHETERIZATION  2010  . CORONARY ANGIOPLASTY WITH STENT PLACEMENT  2013   95-99% prox LAD,  patent LAD stent distal to this, occluded mid-dist LCx, mild <10% RCA irreg; s/p DES-prox LAD & DES to mid-dist LCx  . CORONARY BALLOON ANGIOPLASTY N/A 05/16/2017   Procedure: Coronary Balloon Angioplasty;  Surgeon: Leonie Man, MD;  Location: [place] [facility];  Service: Cardiovascular;  Laterality: N/A;  . [place]   right  . [place]   left  . LEFT HEART CATH Bilateral 09/22/2012   Procedure: LEFT HEART CATH;  Surgeon: Peter M Martinique, MD;  Location: [facility] CATH LAB;  Service: Cardiovascular;  Laterality: Bilateral;  . LEFT HEART CATH AND CORONARY ANGIOGRAPHY N/A 05/16/2017   Procedure: Left Heart Cath and Coronary Angiography;  Surgeon: Leonie Man, MD;  Location: [place] [facility];  Service: Cardiovascular;  Laterality: N/A;  . PERCUTANEOUS CORONARY STENT INTERVENTION (PCI-S) Right 09/22/2012   Procedure: PERCUTANEOUS CORONARY STENT INTERVENTION (PCI-S);  Surgeon: Peter M Martinique, MD;  Location: [facility] CATH LAB;  Service: Cardiovascular;  Laterality: Right;     Family History  Problem Relation Age of Onset  . Heart attack Mother 43  . Heart disease Mother  Social History   Substance and Sexual Activity  Drug Use No     Social History   Substance and Sexual Activity  Alcohol Use No     Social History   Tobacco Use  Smoking Status Never Smoker  Smokeless Tobacco Never Used     Outpatient Encounter Medications as of 02/26/2018  Medication Sig  . AMBULATORY NON FORMULARY MEDICATION Take 180 mg by mouth daily. Medication Name: bempedoic acid vs a placebo CLEAR Research Study drug provided  . amLODipine (NORVASC) 2.5 MG tablet Take 2.5 mg by mouth daily.  [name] Kitchen aspirin EC 81 MG tablet Take 81 mg by mouth daily.  [name] Kitchen BRILINTA 90 MG TABS tablet TAKE 1 TABLET (90 MG TOTAL) BY MOUTH EVERY 12 (TWELVE) HOURS.  . calcium carbonate (TUMS EX) 750 MG chewable tablet Chew 2 tablets by mouth daily as needed for heartburn.  . carvedilol (COREG) 6.25 MG tablet  Take 1 tablet (6.25 mg total) by mouth 2 (two) times daily.  . Inositol Niacinate (NIACIN FLUSH FREE) 500 MG CAPS Take 1,000 mg by mouth daily.  [name] Kitchen lisinopril (PRINIVIL,ZESTRIL) 20 MG tablet Take 20 mg by mouth 2 (two) times daily.  . valACYclovir (VALTREX) 500 MG tablet Take 1 tablet (500 mg total) by mouth daily.  . nitroGLYCERIN (NITROSTAT) 0.4 MG SL tablet Place 1 tablet (0.4 mg total) under the tongue every 5 (five) minutes as needed for chest pain.   No facility-administered encounter medications on file as of 02/26/2018.     Allergies: Antihistamines, chlorpheniramine-type; Pheniramine; Statins; and Zetia [ezetimibe]  Body mass index is 25.62 kg/m.  Blood pressure (!) 146/79, pulse 60, height 5' 8.5" (1.74 m), weight 171 lb (77.6 kg), SpO2 100 %.   Review of Systems  Constitutional: Positive for fatigue. Negative for activity change, appetite change, chills, diaphoresis, fever and unexpected weight change.  Eyes: Negative for visual disturbance.  Respiratory: Negative for cough, chest tightness, shortness of breath, wheezing and stridor.   Cardiovascular: Negative for chest pain, palpitations and leg swelling.  Endocrine: Negative for cold intolerance, heat intolerance, polydipsia, polyphagia and polyuria.  Skin: Negative for color change, pallor and wound.  Psychiatric/Behavioral: Negative for dysphoric mood, hallucinations, self-injury, sleep disturbance and suicidal ideas. The patient is not nervous/anxious and is not hyperactive.        Objective:   Physical Exam  Constitutional: He is oriented to person, place, and time. He appears well-developed and well-nourished. No distress.  HENT:  Head: Normocephalic and atraumatic.  Right Ear: External ear normal. Tympanic membrane is not erythematous and not bulging. No decreased hearing is noted.  Left Ear: External ear normal. Tympanic membrane is not erythematous and not bulging. No decreased hearing is noted.  Nose: Nose  normal. Right sinus exhibits no frontal sinus tenderness. Left sinus exhibits no maxillary sinus tenderness and no frontal sinus tenderness.  Mouth/Throat: Uvula is midline, oropharynx is clear and moist and mucous membranes are normal. Abnormal dentition. No oropharyngeal exudate, posterior oropharyngeal edema, posterior oropharyngeal erythema or tonsillar abscesses.  Eyes: Pupils are equal, round, and reactive to light. Conjunctivae are normal.  Neck: Normal range of motion. Neck supple.  Cardiovascular: Normal rate, regular rhythm and intact distal pulses.  Murmur heard. Pulmonary/Chest: Effort normal. No respiratory distress. He has no wheezes. He has no rales. He exhibits no tenderness.  Abdominal: Soft. Bowel sounds are normal. He exhibits no distension and no mass. There is no tenderness. There is no rebound and no guarding.  Genitourinary: Rectal exam shows guaiac  negative stool.  Genitourinary Comments: .Chaperone present during examination.  Lymphadenopathy:    He has no cervical adenopathy.  Neurological: He is alert and oriented to person, place, and time. Coordination normal.  Skin: Skin is warm and dry. No rash noted. He is not diaphoretic. No erythema. No pallor.  Psychiatric: He has a normal mood and affect. His behavior is normal. Judgment and thought content normal.       Assessment & Plan:   1. Healthcare maintenance   2. Essential hypertension   3. Dyslipidemia   4. Cancer ([place])     HTN (hypertension) He follows heart healthy diet and walks 1-1.5 miles 4 days/week. He reports BP has stabilized, home readings SBP 120-130, DBP 70-80s He denies CP/dyspnea/palpitations. BP today 146/79, HR 60  Healthcare maintenance F/u 6 months  Dyslipidemia Lipids 02/22/18 Tot 183 TGs 122 HDL 31 LDL 128 He is intolerant to statins/zetia Currently participating in CLEAR study He reports taking daily dose of Bempedoic Acid ? mg  Cancer ([place]) Followed by derm every 6  months  Pt was in the office today for 25+ minutes, with over 50% time spent in face to face counseling of patient's various medical conditions and in coordination of care FOLLOW-UP:  Return in about 6 months (around 08/29/2018) for Regular Follow Up.

## 2018-02-26 ENCOUNTER — Encounter: Payer: Self-pay | Admitting: Adult Health

## 2018-02-26 ENCOUNTER — Ambulatory Visit (INDEPENDENT_AMBULATORY_CARE_PROVIDER_SITE_OTHER): Payer: Medicare HMO | Admitting: Adult Health

## 2018-02-26 VITALS — BP 146/79 | HR 60 | Ht 68.5 in | Wt 171.0 lb

## 2018-02-26 DIAGNOSIS — Z0000 Encounter for general adult medical examination without abnormal findings: Secondary | ICD-10-CM

## 2018-02-26 DIAGNOSIS — E785 Hyperlipidemia, unspecified: Secondary | ICD-10-CM

## 2018-02-26 DIAGNOSIS — I10 Essential (primary) hypertension: Secondary | ICD-10-CM

## 2018-02-26 DIAGNOSIS — C801 Malignant (primary) neoplasm, unspecified: Secondary | ICD-10-CM

## 2018-02-26 LAB — POC HEMOCCULT BLD/STL (OFFICE/1-CARD/DIAGNOSTIC): FECAL OCCULT BLD: NEGATIVE

## 2018-02-26 NOTE — Assessment & Plan Note (Signed)
Followed by derm every 6 months

## 2018-02-26 NOTE — Assessment & Plan Note (Signed)
He follows heart healthy diet and walks 1-1.5 miles 4 days/week. He reports BP has stabilized, home readings SBP 120-130, DBP 70-80s He denies CP/dyspnea/palpitations. BP today 146/79, HR 60

## 2018-02-26 NOTE — Assessment & Plan Note (Signed)
F/u 6 months

## 2018-02-26 NOTE — Patient Instructions (Signed)

## 2018-02-26 NOTE — Assessment & Plan Note (Signed)
Lipids 02/22/18 Tot 183 TGs 122 HDL 31 LDL 128 He is intolerant to statins/zetia Currently participating in CLEAR study He reports taking daily dose of Bempedoic Acid ? mg

## 2018-03-06 ENCOUNTER — Telehealth: Payer: Self-pay | Admitting: *Deleted

## 2018-03-06 NOTE — Telephone Encounter (Signed)
Spoke with patient and he said everything was fine.  Reviewed medications and confirmed next clinic visit.  No aes or saes to report.

## 2018-04-02 DIAGNOSIS — D692 Other nonthrombocytopenic purpura: Secondary | ICD-10-CM | POA: Diagnosis not present

## 2018-04-02 DIAGNOSIS — Z85828 Personal history of other malignant neoplasm of skin: Secondary | ICD-10-CM | POA: Diagnosis not present

## 2018-04-02 DIAGNOSIS — Z85820 Personal history of malignant melanoma of skin: Secondary | ICD-10-CM | POA: Diagnosis not present

## 2018-04-02 DIAGNOSIS — L820 Inflamed seborrheic keratosis: Secondary | ICD-10-CM | POA: Diagnosis not present

## 2018-04-02 DIAGNOSIS — L821 Other seborrheic keratosis: Secondary | ICD-10-CM | POA: Diagnosis not present

## 2018-04-02 DIAGNOSIS — D485 Neoplasm of uncertain behavior of skin: Secondary | ICD-10-CM | POA: Diagnosis not present

## 2018-04-02 DIAGNOSIS — L812 Freckles: Secondary | ICD-10-CM | POA: Diagnosis not present

## 2018-04-05 DIAGNOSIS — J019 Acute sinusitis, unspecified: Secondary | ICD-10-CM | POA: Diagnosis not present

## 2018-05-28 ENCOUNTER — Encounter: Payer: Self-pay | Admitting: *Deleted

## 2018-05-28 DIAGNOSIS — Z006 Encounter for examination for normal comparison and control in clinical research program: Secondary | ICD-10-CM

## 2018-05-28 NOTE — Progress Notes (Signed)
Subject to research clinic for visit [phone] in the Clear Research study.  No cos, aes or saes to report.  93% compliant with medications and new drug dispensed.  Next follow up phone and clinic visit scheduled.    Subject re-consented to [place] version 6.0.

## 2018-06-03 ENCOUNTER — Encounter: Payer: Self-pay | Admitting: Cardiology

## 2018-06-10 NOTE — Progress Notes (Signed)
Office Visit    Patient Name: Brandon Gonzales Date of Encounter: 06/12/2018  Primary Care Provider:  [name], NP Primary Cardiologist:  [name], MD   Chief Complaint    67 y/o ? with a h/o CAD, HTN, hypertrophic cm, GERD, and HL, who presents for f/u of CAD.  History of Present Illness    67 y/o ? with  PMH including CAD prior mid LAD stenting in 2007 with  PTCA to D1, followed by lateral STEMI and PCI/DES to the midLCX & proximalLAD in 2013. S/p cutting balloon PTCA of the LAD in June 2019 for in stent restenosis.  He also has a h/o HTN, HL, GERD, and hypertrophic cardiomyopathy.    He was seen in January 2018 and noted to be hypertensive.  He had been checking his BP @ home and it has been variable - 150's to 190's.  His lisinopril was increased to 20 mg daily.  He called on 02/19/17 with complaints of dyspnea and dizziness that he states were similar to when he had his MI. He felt very flushed in his face. He was referred to the ED.  BP recorded by EMS was 210/107. Came down to 159/87 in ED.  Ecg showed NSR with a RBBB- new in January 2018. No acute change. Delta troponin was normal. UA, BMET, BNP were all normal. He was discharged on amlodipine 5 mg daily.  He subsequently developed hypotension and amlodipine was discontinued.   He was evaluated with Echo in April 2018 showing moderate LVH with severe basal septal hypertrophy. No significant outflow gradient. Mild MR. Myoview study showed a small area of scar at the base of the lateral wall. No ischemia. EF 51%. He presented in June 2018 with symptoms of worsening chest pain and dyspnea on exertion. He underwent cardiac cath showing 95% in stent restenosis of the proximal LAD treated with cutting  balloon angioplasty. There was a 65% mid RCA stenosis and 50% in the mid LCx. He was enrolled in the CLEAR trial for hyperlipidemia given history of statin intolerance.   In October he complained of feeling tired in the afternoon.  Underwent colonoscopy that showed some diverticuli but was otherwise benign. He also had a melanoma removed from his right ear and reconstructive surgery.  On his last visit we increased his Coreg to 6.25 mg bid for persistent Htn. Later called for continued elevated BP. Started on amlodipine 2.5 mg daily. When seen at end of February noted BP 130/78. Later on March 9 called complaining of high BP. Talked to fellow on call.  Noted he quit taking Coreg and amlodipine on his own due to feeling like a Zombie. complained on numbness in his head and neck and tingling. BP went up to 190/100.  Advised to resume Coreg. Amlodipine also resumed at lower dose of 2.5 mg daily. On subsequent follow up he was feeling much better.   On follow up today he feels great. No chest pain, dyspnea, palpitations, dizziness. Reports good control of BP. Walking 1.5-2 miles daily. Notes bruising on Brilinta.    Past Medical History    Past Medical History:  Diagnosis Date  . Arthritis   . Cancer (HCC)    SKIN CA  SQUAMOUS CELL  . Coronary artery disease    a. 2007 Stent to LAD, PTCA D1;  b. DES to LAD & LCx 10/13 in the setting of STEMI.  [name] Kitchen Dyslipidemia    a. Statin and zetia-intolerant. On niacin.  [name] Kitchen  GERD (gastroesophageal reflux disease)   . Heart murmur   . Hypertension   . Hypertrophic cardiomyopathy ([place])    a. 09/2012 Echo: EF 55%, mid to apical anterolateral, posterior, apical anterior, apical HK, Gr1 DD, turbulence across LVOT suggesting degree of obstruction, mild MR, mildly dil LA.   Past Surgical History:  Procedure Laterality Date  . CARDIAC CATHETERIZATION  2007   stent to LAD and PTCA of diagonal branch  . CARDIAC CATHETERIZATION  2010  . CORONARY ANGIOPLASTY WITH STENT PLACEMENT  2013   95-99% prox LAD, patent LAD stent distal to this, occluded mid-dist LCx, mild <10% RCA irreg; s/p DES-prox LAD & DES to mid-dist LCx  . CORONARY BALLOON ANGIOPLASTY N/A 05/16/2017   Procedure: Coronary Balloon  Angioplasty;  Surgeon: Leonie Man, MD;  Location: [facility];  Service: Cardiovascular;  Laterality: N/A;  . [place]   right  . [place]   left  . LEFT HEART CATH Bilateral 09/22/2012   Procedure: LEFT HEART CATH;  Surgeon: Dasha Kawabata M Martinique, MD;  Location: [facility] CATH LAB;  Service: Cardiovascular;  Laterality: Bilateral;  . LEFT HEART CATH AND CORONARY ANGIOGRAPHY N/A 05/16/2017   Procedure: Left Heart Cath and Coronary Angiography;  Surgeon: Leonie Man, MD;  Location: [place] [facility];  Service: Cardiovascular;  Laterality: N/A;  . PERCUTANEOUS CORONARY STENT INTERVENTION (PCI-S) Right 09/22/2012   Procedure: PERCUTANEOUS CORONARY STENT INTERVENTION (PCI-S);  Surgeon: Kharizma Lesnick M Martinique, MD;  Location[facility] C[facility] LAB;  Service: Cardiovascular;  Laterality: Right;    Allergies  Allergies  Allergen Reactions  . Antihistamines, Chlorpheniramine-Type Other (See Comments)    Altered mental status  . Pheniramine Other (See Comments)    Altered mental status  . Statins Other (See Comments)    Leg cramps    . Zetia [Ezetimibe] Other (See Comments)    Leg cramps     Home Medications    Allergies as of 06/12/2018      Reactions   Antihistamines, Chlorpheniramine-type Other (See Comments)   Altered mental status   Pheniramine Other (See Comments)   Altered mental status   Statins Other (See Comments)   Leg cramps     Zetia [ezetimibe] Other (See Comments)   Leg cramps      Medication List        Accurate as of 06/12/18  8:11 AM. Always use your most recent med list.          AMBULATORY NON FORMULARY MEDICATION Take 180 mg by mouth daily. Medication Name: bempedoic acid vs a placebo CLEAR Research Study drug provided   amLODipine 2.5 MG tablet Commonly known as:  NORVASC Take 2.5 mg by mouth daily.   aspirin EC 81 MG tablet Take 81 mg by mouth daily.   calcium carbonate 750 MG chewable tablet Commonly known as:  TUMS EX Chew 2 tablets by  mouth daily as needed for heartburn.   carvedilol 6.25 MG tablet Commonly known as:  COREG Take 1 tablet (6.25 mg total) by mouth 2 (two) times daily.   lisinopril 20 MG tablet Commonly known as:  PRINIVIL,ZESTRIL Take 20 mg by mouth 2 (two) times daily.   NIACIN FLUSH FREE 500 MG Caps Generic drug:  Inositol Niacinate Take 1,000 mg by mouth daily.   nitroGLYCERIN 0.4 MG SL tablet Commonly known as:  NITROSTAT Place 1 tablet (0.4 mg total) under the tongue every 5 (five) minutes as needed for chest pain.   valACYclovir  500 MG tablet Commonly known as:  VALTREX Take 1 tablet (500 mg total) by mouth daily.       Review of Systems    As noted in HPI.  All other systems reviewed and are otherwise negative except as noted above.  Physical Exam    VS:  BP 115/78   Pulse 63   Ht 5\' 8"  (1.727 m)   Wt 172 lb 9.6 oz (78.3 kg)   BMI 26.24 kg/m  , BMI Body mass index is 26.24 kg/m. GENERAL:  Well appearing WM in NAD HEENT:  PERRL, EOMI, sclera are clear. Oropharynx is clear. NECK:  No jugular venous distention, carotid upstroke brisk and symmetric, no bruits, no thyromegaly or adenopathy LUNGS:  Clear to auscultation bilaterally CHEST:  Unremarkable HEART:  RRR,  Harsh gr 2/6 systolic murmur across precordium.  PMI not displaced or sustained,S1 and S2 within normal limits, no S3, no S4: no clicks ABD:  Soft, nontender. BS +, no masses or bruits. No hepatomegaly, no splenomegaly EXT:  2 + pulses throughout, no edema, no cyanosis no clubbing SKIN:  Warm and dry.  No rashes NEURO:  Alert and oriented x 3. Cranial nerves II through XII intact. PSYCH:  Cognitively intact       Accessory Clinical Findings    Lab Results  Component Value Date   WBC 5.5 02/22/2018   HGB 15.9 02/22/2018   HCT 43.5 02/22/2018   PLT 181 02/22/2018   GLUCOSE 101 (H) 02/22/2018   CHOL 183 02/22/2018   TRIG 122 02/22/2018   HDL 31 (L) 02/22/2018   LDLDIRECT 159.5 10/01/2012   LDLCALC 128 (H)  02/22/2018   ALT 21 02/22/2018   AST 19 02/22/2018   NA 142 02/22/2018   K 4.6 02/22/2018   CL 105 02/22/2018   CREATININE 1.28 (H) 02/22/2018   BUN 19 02/22/2018   CO2 23 02/22/2018   TSH 4.420 02/22/2018   PSA 1.4 09/13/2010   INR 1.06 05/16/2017   HGBA1C 5.0 02/22/2018   Ecg today shows NSR with RBBB, LAFB. No acute change. I have personally reviewed and interpreted this study.   Assessment & Plan    1.  Hypertensive Heart Disease:  BP is under fair control today .   He has CKD so I would try and avoid a diuretic. Will continue current therapy.   2.  CAD: s/p prior LAD, D1, and LCX PCI with STEMI in 2013. Admitted in June 2018 with in stent restenosis of the proximal LAD. Symptoms predominantly of DOE. Treated with cutting balloon PCI. Interestedly his Myoview study prior was normal so may not be a reliable study.  He is now a year out so will stop Brilinta.  On beta blocker with Coreg.   3.  HL:   intolerant to statins and zetia. On CLEAR trial now. Last LDL 128.   4. Hypertrophic CM:  euvolemic on exam.  BP mgmt as above.      Aveyah Greenwood Martinique, MD,FACC 06/12/2018, 8:11 AM

## 2018-06-12 ENCOUNTER — Encounter: Payer: Self-pay | Admitting: Cardiology

## 2018-06-12 ENCOUNTER — Ambulatory Visit: Payer: Medicare HMO | Admitting: Cardiology

## 2018-06-12 VITALS — BP 115/78 | HR 63 | Ht 68.0 in | Wt 172.6 lb

## 2018-06-12 DIAGNOSIS — E782 Mixed hyperlipidemia: Secondary | ICD-10-CM | POA: Diagnosis not present

## 2018-06-12 DIAGNOSIS — I10 Essential (primary) hypertension: Secondary | ICD-10-CM | POA: Diagnosis not present

## 2018-06-12 DIAGNOSIS — I25118 Atherosclerotic heart disease of native coronary artery with other forms of angina pectoris: Secondary | ICD-10-CM | POA: Diagnosis not present

## 2018-06-12 DIAGNOSIS — I422 Other hypertrophic cardiomyopathy: Secondary | ICD-10-CM

## 2018-06-12 NOTE — Patient Instructions (Signed)
Stop taking Brilinta  Continue your other therapy  I will see you in 6 months.

## 2018-06-17 ENCOUNTER — Other Ambulatory Visit: Payer: Self-pay | Admitting: Cardiology

## 2018-07-24 DIAGNOSIS — Z0100 Encounter for examination of eyes and vision without abnormal findings: Secondary | ICD-10-CM | POA: Diagnosis not present

## 2018-08-13 DIAGNOSIS — Z85828 Personal history of other malignant neoplasm of skin: Secondary | ICD-10-CM | POA: Diagnosis not present

## 2018-08-13 DIAGNOSIS — L820 Inflamed seborrheic keratosis: Secondary | ICD-10-CM | POA: Diagnosis not present

## 2018-08-13 DIAGNOSIS — L821 Other seborrheic keratosis: Secondary | ICD-10-CM | POA: Diagnosis not present

## 2018-08-13 DIAGNOSIS — L570 Actinic keratosis: Secondary | ICD-10-CM | POA: Diagnosis not present

## 2018-09-06 ENCOUNTER — Other Ambulatory Visit: Payer: Self-pay | Admitting: Cardiology

## 2018-09-06 NOTE — Telephone Encounter (Signed)
rx sent to pharmacy

## 2018-09-24 DIAGNOSIS — D045 Carcinoma in situ of skin of trunk: Secondary | ICD-10-CM | POA: Diagnosis not present

## 2018-09-24 DIAGNOSIS — D692 Other nonthrombocytopenic purpura: Secondary | ICD-10-CM | POA: Diagnosis not present

## 2018-09-24 DIAGNOSIS — L812 Freckles: Secondary | ICD-10-CM | POA: Diagnosis not present

## 2018-09-24 DIAGNOSIS — L821 Other seborrheic keratosis: Secondary | ICD-10-CM | POA: Diagnosis not present

## 2018-09-24 DIAGNOSIS — Z85828 Personal history of other malignant neoplasm of skin: Secondary | ICD-10-CM | POA: Diagnosis not present

## 2018-09-24 DIAGNOSIS — L570 Actinic keratosis: Secondary | ICD-10-CM | POA: Diagnosis not present

## 2018-09-24 DIAGNOSIS — Z85820 Personal history of malignant melanoma of skin: Secondary | ICD-10-CM | POA: Diagnosis not present

## 2018-09-24 DIAGNOSIS — D485 Neoplasm of uncertain behavior of skin: Secondary | ICD-10-CM | POA: Diagnosis not present

## 2018-10-24 ENCOUNTER — Emergency Department (HOSPITAL_COMMUNITY): Payer: Medicare HMO

## 2018-10-24 ENCOUNTER — Encounter (HOSPITAL_COMMUNITY): Payer: Self-pay | Admitting: Emergency Medicine

## 2018-10-24 ENCOUNTER — Emergency Department (HOSPITAL_COMMUNITY)
Admission: EM | Admit: 2018-10-24 | Discharge: 2018-10-24 | Disposition: A | Discharge status: Home / Self Care | Payer: Medicare HMO | Attending: Emergency Medicine | Admitting: Emergency Medicine

## 2018-10-24 ENCOUNTER — Other Ambulatory Visit: Payer: Self-pay

## 2018-10-24 DIAGNOSIS — I208 Other forms of angina pectoris: Secondary | ICD-10-CM | POA: Diagnosis not present

## 2018-10-24 DIAGNOSIS — I2510 Atherosclerotic heart disease of native coronary artery without angina pectoris: Secondary | ICD-10-CM | POA: Diagnosis not present

## 2018-10-24 DIAGNOSIS — I421 Obstructive hypertrophic cardiomyopathy: Secondary | ICD-10-CM

## 2018-10-24 DIAGNOSIS — I25118 Atherosclerotic heart disease of native coronary artery with other forms of angina pectoris: Secondary | ICD-10-CM | POA: Diagnosis not present

## 2018-10-24 DIAGNOSIS — I252 Old myocardial infarction: Secondary | ICD-10-CM | POA: Diagnosis not present

## 2018-10-24 DIAGNOSIS — R0789 Other chest pain: Secondary | ICD-10-CM

## 2018-10-24 DIAGNOSIS — R079 Chest pain, unspecified: Secondary | ICD-10-CM | POA: Diagnosis not present

## 2018-10-24 DIAGNOSIS — R0602 Shortness of breath: Secondary | ICD-10-CM | POA: Diagnosis not present

## 2018-10-24 DIAGNOSIS — E7800 Pure hypercholesterolemia, unspecified: Secondary | ICD-10-CM | POA: Diagnosis not present

## 2018-10-24 DIAGNOSIS — Z7982 Long term (current) use of aspirin: Secondary | ICD-10-CM | POA: Insufficient documentation

## 2018-10-24 DIAGNOSIS — Z955 Presence of coronary angioplasty implant and graft: Secondary | ICD-10-CM | POA: Diagnosis not present

## 2018-10-24 DIAGNOSIS — Z79899 Other long term (current) drug therapy: Secondary | ICD-10-CM | POA: Diagnosis not present

## 2018-10-24 DIAGNOSIS — I10 Essential (primary) hypertension: Secondary | ICD-10-CM | POA: Diagnosis not present

## 2018-10-24 DIAGNOSIS — N183 Chronic kidney disease, stage 3 (moderate): Secondary | ICD-10-CM | POA: Insufficient documentation

## 2018-10-24 DIAGNOSIS — Z85828 Personal history of other malignant neoplasm of skin: Secondary | ICD-10-CM | POA: Insufficient documentation

## 2018-10-24 DIAGNOSIS — I129 Hypertensive chronic kidney disease with stage 1 through stage 4 chronic kidney disease, or unspecified chronic kidney disease: Secondary | ICD-10-CM | POA: Insufficient documentation

## 2018-10-24 LAB — CBC WITH DIFFERENTIAL/PLATELET
ABS IMMATURE GRANULOCYTES: 0.03 10*3/uL (ref 0.00–0.07)
BASOS ABS: 0 10*3/uL (ref 0.0–0.1)
BASOS PCT: 0 %
Eosinophils Absolute: 0.1 10*3/uL (ref 0.0–0.5)
Eosinophils Relative: 2 %
HCT: 48.9 % (ref 39.0–52.0)
HEMOGLOBIN: 15.9 g/dL (ref 13.0–17.0)
IMMATURE GRANULOCYTES: 0 %
LYMPHS PCT: 19 %
Lymphs Abs: 1.4 10*3/uL (ref 0.7–4.0)
MCH: 29.8 pg (ref 26.0–34.0)
MCHC: 32.5 g/dL (ref 30.0–36.0)
MCV: 91.6 fL (ref 80.0–100.0)
Monocytes Absolute: 0.6 10*3/uL (ref 0.1–1.0)
Monocytes Relative: 9 %
NEUTROS ABS: 5.2 10*3/uL (ref 1.7–7.7)
NEUTROS PCT: 70 %
NRBC: 0 % (ref 0.0–0.2)
PLATELETS: 186 10*3/uL (ref 150–400)
RBC: 5.34 MIL/uL (ref 4.22–5.81)
RDW: 12.7 % (ref 11.5–15.5)
WBC: 7.4 10*3/uL (ref 4.0–10.5)

## 2018-10-24 LAB — I-STAT TROPONIN, ED: Troponin i, poc: 0 ng/mL (ref 0.00–0.08)

## 2018-10-24 LAB — BASIC METABOLIC PANEL
ANION GAP: 9 (ref 5–15)
BUN: 22 mg/dL (ref 8–23)
CHLORIDE: 108 mmol/L (ref 98–111)
CO2: 24 mmol/L (ref 22–32)
Calcium: 9.6 mg/dL (ref 8.9–10.3)
Creatinine, Ser: 1.46 mg/dL — ABNORMAL HIGH (ref 0.61–1.24)
GFR calc non Af Amer: 47 mL/min — ABNORMAL LOW (ref >60)
GFR, EST AFRICAN AMERICAN: 55 mL/min — AB (ref >60)
Glucose, Bld: 125 mg/dL — ABNORMAL HIGH (ref 70–99)
POTASSIUM: 4.4 mmol/L (ref 3.5–5.1)
SODIUM: 141 mmol/L (ref 135–145)

## 2018-10-24 LAB — BRAIN NATRIURETIC PEPTIDE: B NATRIURETIC PEPTIDE 5: 67.8 pg/mL (ref 0.0–100.0)

## 2018-10-24 LAB — URINALYSIS, ROUTINE W REFLEX MICROSCOPIC
BACTERIA UA: NONE SEEN
Bilirubin Urine: NEGATIVE
Glucose, UA: NEGATIVE mg/dL
Ketones, ur: NEGATIVE mg/dL
Leukocytes, UA: NEGATIVE
NITRITE: NEGATIVE
PROTEIN: NEGATIVE mg/dL
Specific Gravity, Urine: 1.005 (ref 1.005–1.030)
pH: 7 (ref 5.0–8.0)

## 2018-10-24 LAB — TROPONIN I

## 2018-10-24 MED ORDER — CARVEDILOL 12.5 MG PO TABS: 12.5000 mg | ORAL_TABLET | Freq: Two times a day (BID) | ORAL | 0 refills | Status: DC

## 2018-10-24 MED ORDER — ASPIRIN 81 MG PO CHEW
162.0000 mg | CHEWABLE_TABLET | Freq: Once | ORAL | Status: AC
  Administered 2018-10-24: 162 mg via ORAL
  Filled 2018-10-24: qty 2

## 2018-10-24 MED ORDER — CARVEDILOL 12.5 MG PO TABS: 12.5000 mg | ORAL_TABLET | Freq: Two times a day (BID) | ORAL | Status: DC

## 2018-10-24 MED ORDER — HEPARIN BOLUS VIA INFUSION
4000.0000 [IU] | Freq: Once | INTRAVENOUS | Status: DC
  Filled 2018-10-24: qty 4000

## 2018-10-24 MED ORDER — ASPIRIN 81 MG PO CHEW: 324.0000 mg | CHEWABLE_TABLET | Freq: Once | ORAL | Status: DC

## 2018-10-24 MED ORDER — HEPARIN (PORCINE) 25000 UT/250ML-% IV SOLN
1050.0000 [IU]/h | INTRAVENOUS | Status: DC
  Filled 2018-10-24: qty 250

## 2018-10-24 NOTE — ED Provider Notes (Signed)
[place] EMERGENCY DEPARTMENT Provider Note   CSN: [id number] Arrival date & time: 10/24/18  1316     History   Chief Complaint Chief Complaint  Patient presents with  . Chest Pain    HPI Brandon Gonzales is a 67 y.o. male who presents with chest pain.  Past medical history significant for coronary artery disease, hypertrophic cardiomyopathy, HTN, HLD.  Patient states that he was outside today and ran up a hill to get a shovel and had an acute onset of severe substernal chest pain radiating to the right jaw.  Feels similar to prior MI and when he had in stent restenosis.  Reports associated shortness of breath, numbness of the right jaw, and lightheadedness with diaphoresis.  The pain lasted about 6 minutes and then lessened over the next hour and a half.  Currently he is pain-free but reports residual pressure.  He decided to come to the hospital to be evaluated due to his history of multiple stents.  No fever, syncope, neck pain, current chest pain or shortness of breath, cough, abdominal pain, leg swelling.  Cardiologist: Dr. Ronnald Ramp  HPI  Past Medical History:  Diagnosis Date  . Arthritis   . Cancer (HCC)    SKIN CA  SQUAMOUS CELL  . Coronary artery disease    a. 2007 Stent to LAD, PTCA D1;  b. DES to LAD & LCx 10/13 in the setting of STEMI.  [name] Kitchen Dyslipidemia    a. Statin and zetia-intolerant. On niacin.  [name] Kitchen GERD (gastroesophageal reflux disease)   . Heart murmur   . Hypertension   . Hypertrophic cardiomyopathy ([place])    a. 09/2012 Echo: EF 55%, mid to apical anterolateral, posterior, apical anterior, apical HK, Gr1 DD, turbulence across LVOT suggesting degree of obstruction, mild MR, mildly dil LA.    Patient Active Problem List   Diagnosis Date Noted  . Herpes labialis 01/31/2018  . Healthcare maintenance 01/31/2018  . Hypertension   . Heart murmur   . GERD (gastroesophageal reflux disease)   . Coronary artery disease   . Cancer ([place])   . Angina at  rest [facility]) 05/17/2017  . Hypertrophic cardiomyopathy ([place])   . S/P PTCA (percutaneous transluminal coronary angioplasty)   . Chest pain 05/16/2017  . Unstable angina ([place])   . Dyspnea on exertion 02/21/2017  . Neck pain 12/28/2016  . NSVT (nonsustained ventricular tachycardia) ([place]) 09/25/2012  . Hypertrophic obstructive cardiomyopathy(425.11) 09/25/2012  . CKD (chronic kidney disease) stage 3, GFR 30-59 ml/min (HCC) 09/25/2012  . ST elevation myocardial infarction (STEMI) of lateral wall ([place]) 09/22/2012  . CAD (coronary artery disease) 01/11/2012  . HTN (hypertension) 01/11/2012  . Dyslipidemia   . H/O gastroesophageal reflux (GERD)   . Arthritis     Past Surgical History:  Procedure Laterality Date  . CARDIAC CATHETERIZATION  2007   stent to LAD and PTCA of diagonal branch  . CARDIAC CATHETERIZATION  2010  . CORONARY ANGIOPLASTY WITH STENT PLACEMENT  2013   95-99% prox LAD, patent LAD stent distal to this, occluded mid-dist LCx, mild <10% RCA irreg; s/p DES-prox LAD & DES to mid-dist LCx  . CORONARY BALLOON ANGIOPLASTY N/A 05/16/2017   Procedure: Coronary Balloon Angioplasty;  Surgeon: Leonie Man, MD;  Location: [place] CV LAB;  Service: Cardiovascular;  Laterality: N/A;  . [place]   right  . [place]   left  . LEFT HEART CATH Bilateral 09/22/2012   Procedure: LEFT HEART CATH;  Surgeon: Peter M Martinique, MD;  Location: [facility] CATH LAB;  Service: Cardiovascular;  Laterality: Bilateral;  . LEFT HEART CATH AND CORONARY ANGIOGRAPHY N/A 05/16/2017   Procedure: Left Heart Cath and Coronary Angiography;  Surgeon: Leonie Man, MD;  Location: [place] [facility];  Service: Cardiovascular;  Laterality: N/A;  . PERCUTANEOUS CORONARY STENT INTERVENTION (PCI-S) Right 09/22/2012   Procedure: PERCUTANEOUS CORONARY STENT INTERVENTION (PCI-S);  Surgeon: Peter M Martinique, MD;  Location: [facility] CATH LAB;  Service: Cardiovascular;  Laterality: Right;        Home  Medications    Prior to Admission medications   Medication Sig Start Date End Date Taking? Authorizing Provider  AMBULATORY NON FORMULARY MEDICATION Take 180 mg by mouth daily. Medication Name: bempedoic acid vs a placebo CLEAR Research Study drug provided 05/02/17   Lelon Perla, MD  amLODipine (NORVASC) 2.5 MG tablet TAKE 1 TABLET BY MOUTH EVERY DAY 09/06/18   Martinique, Peter M, MD  aspirin EC 81 MG tablet Take 81 mg by mouth daily.    [provider]  calcium carbonate (TUMS EX) 750 MG chewable tablet Chew 2 tablets by mouth daily as needed for heartburn.    [provider]  carvedilol (COREG) 6.25 MG tablet Take 1 tablet (6.25 mg total) by mouth 2 (two) times daily. 01/24/18   Martinique, Peter M, MD  Inositol Niacinate (NIACIN FLUSH FREE) 500 MG CAPS Take 1,000 mg by mouth daily.    [provider]  lisinopril (PRINIVIL,ZESTRIL) 20 MG tablet Take 20 mg by mouth 2 (two) times daily.    [provider]  lisinopril (PRINIVIL,ZESTRIL) 20 MG tablet TAKE 2 TABLETS BY MOUTH EVERY DAY 06/17/18   Martinique, Peter M, MD  nitroGLYCERIN (NITROSTAT) 0.4 MG SL tablet Place 1 tablet (0.4 mg total) under the tongue every 5 (five) minutes as needed for chest pain. 06/18/17 12/19/17  Martinique, Peter M, MD  valACYclovir (VALTREX) 500 MG tablet Take 1 tablet (500 mg total) by mouth daily. 01/31/18   [name], NP    Family History Family History  Problem Relation Age of Onset  . Heart attack Mother 35  . Heart disease Mother     Social History Social History   Tobacco Use  . Smoking status: Never Smoker  . Smokeless tobacco: Never Used  Substance Use Topics  . Alcohol use: No  . Drug use: No     Allergies   Antihistamines, chlorpheniramine-type; Pheniramine; Statins; and Zetia [ezetimibe]   Review of Systems Review of Systems  Constitutional: Positive for diaphoresis. Negative for chills, fatigue and fever.  Respiratory: Positive for shortness of breath.  Negative for cough and wheezing.   Cardiovascular: Positive for chest pain. Negative for palpitations and leg swelling.  Gastrointestinal: Negative for abdominal pain, nausea and vomiting.  Neurological: Positive for light-headedness. Negative for syncope.  All other systems reviewed and are negative.    Physical Exam Updated Vital Signs BP (!) 149/100 (BP Location: Right Arm)   Pulse (!) 117   Temp 97.7 F (36.5 C) (Oral)   Resp 17   Ht 5' 8.5" (1.74 m)   Wt 76.2 kg   SpO2 98%   BMI 25.17 kg/m   Physical Exam  Constitutional: He is oriented to person, place, and time. He appears well-developed and well-nourished. No distress.  Calm and cooperative  HENT:  Head: Normocephalic and atraumatic.  Eyes: Pupils are equal, round, and reactive to light. Conjunctivae are normal. Right eye exhibits no discharge. Left eye  exhibits no discharge. No scleral icterus.  Neck: Normal range of motion.  Cardiovascular: Normal rate and regular rhythm.  Murmur heard. Pulmonary/Chest: Effort normal and breath sounds normal. No respiratory distress.  Abdominal: Soft. Bowel sounds are normal. He exhibits no distension. There is no tenderness.  Musculoskeletal:  No peripheral edema  Neurological: He is alert and oriented to person, place, and time.  Skin: Skin is warm and dry.  Psychiatric: He has a normal mood and affect. His behavior is normal.  Nursing note and vitals reviewed.    ED Treatments / Results  Labs (all labs ordered are listed, but only abnormal results are displayed) Labs Reviewed  BASIC METABOLIC PANEL - Abnormal; Notable for the following components:      Result Value   Glucose, Bld 125 (*)    Creatinine, Ser 1.46 (*)    GFR calc non Af Amer 47 (*)    GFR calc Af Amer 55 (*)    All other components within normal limits  URINALYSIS, ROUTINE W REFLEX MICROSCOPIC - Abnormal; Notable for the following components:   Color, Urine STRAW (*)    Hgb urine dipstick SMALL (*)     All other components within normal limits  BRAIN NATRIURETIC PEPTIDE  CBC WITH DIFFERENTIAL/PLATELET  HEPARIN LEVEL (UNFRACTIONATED)  TROPONIN I  TROPONIN I  TROPONIN I  I-STAT TROPONIN, ED    EKG EKG Interpretation  Date/Time:  Thursday October 24 2018 13:24:15 EST Ventricular Rate:  115 PR Interval:    QRS Duration: 134 QT Interval:  347 QTC Calculation: 480 R Axis:   -84 Text Interpretation:  Sinus tachycardia Right bundle branch block Inferior infarct, old old RBBB. no STEMI. rate increased compared to previous Confirmed by Charlesetta Shanks [phone]) on 10/24/2018 1:49:50 PM   Radiology Dg Chest Port 1 View  Result Date: 10/24/2018 CLINICAL DATA:  Central chest pain beginning this morning. History of cardiac stents and myocardial infarction. EXAM: PORTABLE CHEST 1 VIEW COMPARISON:  Chest radiograph May 16, 2017 FINDINGS: Cardiac silhouette is upper limits of normal in size and unchanged. Coronary artery stents noted. Mediastinal silhouette is not suspicious, mildly tortuous aorta associated with chronic hypertension. Mildly calcified aortic arch. No pleural effusion or focal consolidation. No pneumothorax. Trace LEFT apical calcified pleural plaque. LEFT lung base. No pneumothorax. Soft tissue planes and included osseous structures are non suspicious. IMPRESSION: Borderline cardiomegaly.  No acute pulmonary process. Electronically Signed   By: Elon Alas M.D.   On: 10/24/2018 14:15    Procedures Procedures (including critical care time)  Medications Ordered in ED Medications  heparin bolus via infusion 4,000 Units ( Intravenous Not Given 10/24/18 1520)  heparin ADULT infusion 100 units/mL (25000 units/261mL sodium chloride 0.45%) (1,050 Units/hr Intravenous Not Given 10/24/18 1520)  carvedilol (COREG) tablet 12.5 mg (has no administration in time range)  aspirin chewable tablet 162 mg (162 mg Oral Given 10/24/18 1505)     Initial Impression / Assessment and Plan /  ED Course  I have reviewed the triage vital signs and the nursing notes.  Pertinent labs & imaging results that were available during my care of the patient were reviewed by me and considered in my medical decision making (see chart for details).  67 year old male presents with acute onset of chest pain and shortness of breath after running up a hill earlier today.  Blood pressure is elevated but otherwise vital signs are normal.  Heart is regular rate and rhythm.  Lungs are clear to auscultation.  Pain is  essentially resolved but he still has residual pressure.  EKG is sinus rhythm with right bundle branch block.  He has slight elevation of his creatinine but otherwise blood work is normal.  Initial point-of-care troponin is normal.  Chest x-ray is negative.  Shared visit with Dr. Johnney Killian. Initial concern was for unstable angina and a heparin drip was ordered.  Dr. Sallyanne Kuster with cardiology evaluated patient.  He believes that the patient can have a delta troponin and possibly go home if this remains normal since the patient admits to multiple stressors that he think is causing his chest pain. Heparin drip cancelled. Care signed out to Dr. Vanita Panda who will follow delta trop.  Final Clinical Impressions(s) / ED Diagnoses   Final diagnoses:  Nonspecific chest pain    ED Discharge Orders    None       Recardo Evangelist, PA-C 10/24/18 1629    Charlesetta Shanks, MD 10/26/18 [phone]

## 2018-10-24 NOTE — ED Triage Notes (Signed)
Pt reports shortness of breath and chest pain while running. Hx stemi, states this feels different. States "I can just tell something is not right."

## 2018-10-24 NOTE — H&P (Addendum)
History & Physical    Patient ID: Brandon Gonzales MRN: [id number], DOB/AGE: 1951-04-07   Admit date: 10/24/2018  Primary Physician: [name], NP Primary Cardiologist: Dr. Peter Martinique, MD  Patient Profile    Brandon Gonzales is a 67 year old male with a history of CAD with prior mid LAD stenting in 2007 with PTCA to D1, followed bilateral STEMI and PCI/DES to the mid LCx and pLAD in 2013, s/p cutting balloon PTCA of the LAD and 05/2017 for in-stent restenosis. Also with a history of hypertension, hyperlipidemia, GERD, CKD stage II and hypertrophic cardiomyopathy.  Past Medical History   Past Medical History:  Diagnosis Date  . Arthritis   . Cancer (HCC)    SKIN CA  SQUAMOUS CELL  . Coronary artery disease    a. 2007 Stent to LAD, PTCA D1;  b. DES to LAD & LCx 10/13 in the setting of STEMI.  [name] Kitchen Dyslipidemia    a. Statin and zetia-intolerant. On niacin.  [name] Kitchen GERD (gastroesophageal reflux disease)   . Heart murmur   . Hypertension   . Hypertrophic cardiomyopathy ([place])    a. 09/2012 Echo: EF 55%, mid to apical anterolateral, posterior, apical anterior, apical HK, Gr1 DD, turbulence across LVOT suggesting degree of obstruction, mild MR, mildly dil LA.    Past Surgical History:  Procedure Laterality Date  . CARDIAC CATHETERIZATION  2007   stent to LAD and PTCA of diagonal branch  . CARDIAC CATHETERIZATION  2010  . CORONARY ANGIOPLASTY WITH STENT PLACEMENT  2013   95-99% prox LAD, patent LAD stent distal to this, occluded mid-dist LCx, mild <10% RCA irreg; s/p DES-prox LAD & DES to mid-dist LCx  . CORONARY BALLOON ANGIOPLASTY N/A 05/16/2017   Procedure: Coronary Balloon Angioplasty;  Surgeon: Leonie Man, MD;  Location: [place] CV LAB;  Service: Cardiovascular;  Laterality: N/A;  . [place]   right  . [place]   left  . LEFT HEART CATH Bilateral 09/22/2012   Procedure: LEFT HEART CATH;  Surgeon: Peter M Martinique, MD;  Location: [facility] CATH LAB;  Service:  Cardiovascular;  Laterality: Bilateral;  . LEFT HEART CATH AND CORONARY ANGIOGRAPHY N/A 05/16/2017   Procedure: Left Heart Cath and Coronary Angiography;  Surgeon: Leonie Man, MD;  Location: [place] CV LAB;  Service: Cardiovascular;  Laterality: N/A;  . PERCUTANEOUS CORONARY STENT INTERVENTION (PCI-S) Right 09/22/2012   Procedure: PERCUTANEOUS CORONARY STENT INTERVENTION (PCI-S);  Surgeon: Peter M Martinique, MD;  Location: [facility] CATH LAB;  Service: Cardiovascular;  Laterality: Right;    Allergies  Allergies  Allergen Reactions  . Antihistamines, Chlorpheniramine-Type Other (See Comments)    Altered mental status  . Pheniramine Other (See Comments)    Altered mental status  . Statins Other (See Comments)    Leg cramps    . Zetia [Ezetimibe] Other (See Comments)    Leg cramps   History of Present Illness    Brandon Gonzales is a 67 year old male with a history stated above who presented to [facility] on 10/24/2018 with complaints of acute onset of exertional chest pain which began after a brief run up a hill to grab a shovel.  Patient states that prior to this episode, he was in his usual state of health and has not experienced exertional chest pain since his episode in 2013.  He describes the pain as severe and sharp with pressure and tightness with radiation to his right jaw with mild facial numbness and tingling.  He reports he had associated shortness of breath in which he had to sit down for relief for approximately 15 minutes before the his symptoms subsided. Given his history and new onset of symptoms, he reported to the emergency department for further evaluation.  In the ED, EKG showed sinus tachycardia with evidence of old RBBB and no acute ischemic changes.  Initial i-STAT troponin was negative at 0.00.  CXR with borderline cardiomegaly and no acute pulmonary process.  Given patient's past medical history ED physician consulted cardiology and initiated heparin with concern for ACS.  He is  currently chest pain-free and feels like "he can go home ".  He denies shortness of breath, palpitations, dizziness, LE swelling, orthopnea symptoms, PND or syncopal episodes.  He denies recent illness including fever, chills, nausea or vomiting.  He denies diaphoresis or pain radiation.  Reports he was taken off his Brilinta and his last office visit 06/2018.  He continues to take ASA 81 mg.  He denies tobacco, alcohol or illicit drug use.  He is an active guy with no other complaints  Of note, patient was seen 12/2016 and noted to be hypertensive. He had been checking his BP at home it was noted to be variable. He called on 02/19/2017 with complaints of dyspnea and dizziness that he states were similar to when he had his MI.  He was referred to the ED.  BP on presentation was 210/107.  ECG showed NSR with RBBB.  Delta troponin was normal.  He was discharged on amlodipine 5 mg daily and subsequently developed hypotension and therefore amlodipine was discontinued.  He was evaluated with an echocardiogram in 03/2017 which showed moderate LVH with severe basal septal hypertrophy with no significant outflow gradient and mild MR.  Myoview study showed a small area of scar at the base of the lateral wall and no ischemia within EF of 51%.  He presented and 05/2017 with symptoms of worsening chest pain and dyspnea on exertion.  He underwent a cardiac cath which revealed 95% in stent restenosis of the proximal LAD treated with a cutting balloon angioplasty. There was a 65% mid RCA stenosis and a 50% in the mid LCx.  He was enrolled in the CLEAR trial for hyperlipidemia given history of statin intolerance.  On his last office visit 06/12/2018 he was feeling well.  He had no complaints of chest pain, dyspnea, palpitations or dizziness.  He was walking 1.5-2 miles daily.  He was compliant with his Brilinta.  Cardiology has been asked to evaluate given his prior hx with suspicion of ACS.   Home Medications    Prior to  Admission medications   Medication Sig Start Date End Date Taking? Authorizing Provider  AMBULATORY NON FORMULARY MEDICATION Take 180 mg by mouth daily. Medication Name: bempedoic acid vs a placebo CLEAR Research Study drug provided 05/02/17   Lelon Perla, MD  amLODipine (NORVASC) 2.5 MG tablet TAKE 1 TABLET BY MOUTH EVERY DAY 09/06/18   Martinique, Peter M, MD  aspirin EC 81 MG tablet Take 81 mg by mouth daily.    [provider]  calcium carbonate (TUMS EX) 750 MG chewable tablet Chew 2 tablets by mouth daily as needed for heartburn.    [provider]  carvedilol (COREG) 6.25 MG tablet Take 1 tablet (6.25 mg total) by mouth 2 (two) times daily. 01/24/18   Martinique, Peter M, MD  Inositol Niacinate (NIACIN FLUSH FREE) 500 MG CAPS Take 1,000 mg by mouth daily.  [provider]  lisinopril (PRINIVIL,ZESTRIL) 20 MG tablet Take 20 mg by mouth 2 (two) times daily.    [provider]  lisinopril (PRINIVIL,ZESTRIL) 20 MG tablet TAKE 2 TABLETS BY MOUTH EVERY DAY 06/17/18   Martinique, Peter M, MD  nitroGLYCERIN (NITROSTAT) 0.4 MG SL tablet Place 1 tablet (0.4 mg total) under the tongue every 5 (five) minutes as needed for chest pain. 06/18/17 12/19/17  Martinique, Peter M, MD  valACYclovir (VALTREX) 500 MG tablet Take 1 tablet (500 mg total) by mouth daily. 01/31/18   [name], NP    Family History    Family History  Problem Relation Age of Onset  . Heart attack Mother 73  . Heart disease Mother     Social History    Social History   Socioeconomic History  . Marital status: Married    Spouse name: Not on file  . Number of children: Not on file  . Years of education: Not on file  . Highest education level: Not on file  Occupational History  . Not on file  Social Needs  . Financial resource strain: Not on file  . Food insecurity:    Worry: Not on file    Inability: Not on file  . Transportation needs:    Medical: Not on file    Non-medical: Not on file    Tobacco Use  . Smoking status: Never Smoker  . Smokeless tobacco: Never Used  Substance and Sexual Activity  . Alcohol use: No  . Drug use: No  . Sexual activity: Not Currently  Lifestyle  . Physical activity:    Days per week: 4 days    Minutes per session: Not on file  . Stress: Not on file  Relationships  . Social connections:    Talks on phone: Not on file    Gets together: Not on file    Attends religious service: Not on file    Active member of club or organization: Not on file    Attends meetings of clubs or organizations: Not on file    Relationship status: Not on file  . Intimate partner violence:    Fear of current or ex partner: Not on file    Emotionally abused: Not on file    Physically abused: Not on file    Forced sexual activity: Not on file  Other Topics Concern  . Not on file  Social History Narrative  . Not on file    Review of Systems   See HPI All other systems reviewed and are otherwise negative except as noted above.  Physical Exam    Blood pressure (!) 149/100, pulse (!) 117, temperature 97.7 F (36.5 C), temperature source Oral, resp. rate 17, height 5' 8.5" (1.74 m), weight 76.2 kg, SpO2 98 %.   General: Well developed, well nourished, NAD Skin: Warm, dry, intact  Head: Normocephalic, atraumatic, clear, moist mucus membranes. Neck: Negative for carotid bruits. No JVD Lungs:Clear to ausculation bilaterally. No wheezes, rales, or rhonchi. Breathing is unlabored. Cardiovascular: RRR with S1 S2. + murmur. No rubs, gallops, or LV heave appreciated. Abdomen: Soft, non-tender, non-distended with normoactive bowel sounds. No obvious abdominal masses. MSK: Strength and tone appear normal for age. 5/5 in all extremities Extremities: No edema. No clubbing or cyanosis. DP/PT pulses 2+ bilaterally Neuro: Alert and oriented. No focal deficits. No facial asymmetry. MAE spontaneously. Psych: Responds to questions appropriately with normal affect.     Labs    Troponin [facility]  Test) No results for input(s): TROPIPOC in the last 72 hours. No results for input(s): CKTOTAL, CKMB, TROPONINI in the last 72 hours. Lab Results  Component Value Date   WBC 5.5 02/22/2018   HGB 15.9 02/22/2018   HCT 43.5 02/22/2018   MCV 89 02/22/2018   PLT 181 02/22/2018   No results for input(s): NA, K, CL, CO2, BUN, CREATININE, CALCIUM, PROT, BILITOT, ALKPHOS, ALT, AST, GLUCOSE in the last 168 hours.  Invalid input(s): LABALBU Lab Results  Component Value Date   CHOL 183 02/22/2018   HDL 31 (L) 02/22/2018   LDLCALC 128 (H) 02/22/2018   TRIG 122 02/22/2018   Lab Results  Component Value Date   DDIMER 0.28 05/16/2017   Radiology Studies    No results found.  ECG & Cardiac Imaging    EKG: 10/24/2018: Sinus tachycardia with old RBBB and no acute ischemic changes  Cardiac catheterization 05/16/2017:  Ost RCA lesion, 40 %stenosed. Mid RCA lesion, 65 %stenosed. Borderline significant.  The left ventricular systolic function is normal.  LV end diastolic pressure is mildly elevated.  There is no mitral valve regurgitation.  There is no aortic valve stenosis.  Prox LAD to Mid LAD bare-metal stent, 20 %stenosed.  Mid LAD lesion, 40 %stenosed. Localized segment of previous bare-metal stent-  2nd Mrg DES stent, 0 %stenosed.  Prox Cx to Mid Cx lesion, 40 %stenosed.  Ost LAD lesion, 95 %stenosed.  Post intervention, there is a 10% residual stenosis.   Brandon Gonzales has several potential culprit lesions, however the most obvious one in the LAD with 95% stenosis. This is in-stent restenosis, therefore treated with balloon angioplasty using the [facility] Cutting Balloon followed by post-dilation with a noncompliant balloon.  The RCA lesion is of borderline significance, but does not appear to be acute in nature. Would defer intervention on this vessel at this time unless she has ongoing or worsening symptoms.   Plan: Overnight  monitoring and likely discharge tomorrow Return to nursing unit for ongoing care and TR been removal. Back on dual antiplatelet therapy.- Reduced aspirin 81 mg him and using Brilinta. Further risk factor modification per rounding team.  He can follow-up with Dr. Martinique, will need to have a follow-up appointment rescheduled as he was post be there tomorrow June 14.   Echocardiogram 05/16/2017: Study Conclusions  - Left ventricle: The cavity size was normal. There was severe   hypertrophy of the septum with otherwise mild concentric   hypertrophy consistent with hypertrophic cardiomyopathy. Systolic   function was normal. The estimated ejection fraction was in the   range of 60% to 65%. Wall motion was normal; there were no   regional wall motion abnormalities. Doppler parameters are   consistent with abnormal left ventricular relaxation (grade 1   diastolic dysfunction). Doppler parameters are consistent with   indeterminate ventricular filling pressure. - Aortic valve: Transvalvular velocity was within the normal range.   There was no stenosis. There was mild regurgitation. - Mitral valve: Transvalvular velocity was within the normal range.   There was no evidence for stenosis. There was trivial   regurgitation. - Right ventricle: The cavity size was normal. Wall thickness was   normal. Systolic function was normal. - Tricuspid valve: There was trivial regurgitation. - Pulmonary arteries: Systolic pressure was within the normal   range. PA peak pressure: 13 mm Hg (S). - Global longitudinal strain -18.0%.  Myoview stress test 03/02/2017:  Notes recorded by Peter M Martinique, MD on 03/02/2017 at 3:03 PM EDT  Myoview study shows a small area of scar at base of lateral wall. No ischemia. EF good at 51%. Overall looks very good.  Assessment & Plan    1.  Exertional chest pain with history of CAD s/p tubal stent placement: -s/p prior LAD, D1, and LCX PCI with STEMI in 2013. Admitted  in June 2018 with in stent restenosis of the proximal LAD 2018. Symptoms at that time predominantly of DOE>> treated with cutting balloon PCI.  -Myoview study (several months prior) was normal so may not be a reliable study.   -Brilinta stopped at last office visit 06/2018  -Patient presented with an acute episode of exertional chest pain with radiation to his right jaw with facial numbness and tingling which began earlier today while running up a short hill.  He reports the pain was constant for approximately 15 to 20 minutes and slowly subsided on its own.  Since that time, he reports he has not felt "quite" and given his prior cardiac history proceeded to the emergency room for further evaluation. -EKG with known RBBB and sinus tachycardia with no acute ischemic changes -Initial i-STAT troponin, 0.00>> continue to trend, if second trop elevated will admit for further evaluation. If negative, can go home and will set him up for follow up in two weeks with APP  -CXR with no acute cardiopulmonary changes -Currently chest pain-free -Participant in CLEAR trial -Heparin gtt per pharmacy, per ED physician with concern for ACS -Continue ASA 81, beta-blocker -Will increase carvedilol to 12.5mg  twice daily   2.  Hypertensive heart disease: -BP elevated, 149/100 -Continue carvedilol 6.25 mg twice daily>>increase to 12.5mg   -Also on amlodipine 2.5 mg daily with consideration for dose titration  3.  HLD: -Known intolerance to statin, Zetia -Current participant in CLEAR trial -Last LDL, 128  4.  Hypertrophic cardiomyopathy: -Stable, last echocardiogram 05/2017 with LVEF of 60 to 65% with severe hypertrophy of the septum Consistent with hypertrophic cardiomyopathy.  There was evidence of G1 DD -Appears euvolemic on exam today  5. CKD stage II: -Creatinine, 1.46 today with baseline of 1.2-1.4 -Continue to monitor closely and avoid nephrotoxic medications.   Signed, Kathyrn Drown  NP-C [facility] Pager: [phone] 10/24/2018, @NOW   I have seen and examined the patient along with Kathyrn Drown NP.  I have reviewed the chart, notes and new data.  I agree with NP's note.  Key new complaints:   Currently asymptomatic. Had angina for roughly 15 minutes, triggered by  a burst of intense physical activity (running up hill). Otherwise has been doing well physically. Has been under emotional stress due to his adult son's substance abuse problems, losing custody of his grandson. He is 17 months s/p last PCI (plain angioplasty for in-stent restenosis), beyond typical restenosis window. Key examination changes: he has persistently elevated DBP (90s-100), even when relaxed. Synamic, harsh systolic ejection murmur is heard louder with Valsalva, almost disappears with standing, c/w outflow tract obstruction (documented on previous echo) Key new findings / data: first troponin normal. ECG low risk (sinus tachycardia, old inferior MI, RBBB, but no repolarization changes)  PLAN: Suspect he has exertional (demand-related) angina caused by intense physical activity on background of HTN, HOCM-like pathophysiology and emotional state. For all these reasons, should do better with a higher dose of beta blocker. Avoid dehydration, use of diuretics. Check another troponin. If normal, DC home on carvedilol 12.5 mg twice daily, continue amlodipine for now. In time, maximize beta blocker use rather than vasodilators. Will arrange early follow up  in clinic. If the repeat cardiac enzyme level is abnormal, admit for cardiac catheterization in AM.  Sanda Klein, MD, [place] [phone] 10/24/2018, 3:43 PM

## 2018-10-24 NOTE — Discharge Instructions (Signed)
Please be sure to follow-up with your cardiology team, and take your new regimen of prescriptions.

## 2018-10-24 NOTE — Progress Notes (Signed)
ANTICOAGULATION CONSULT NOTE - Initial Consult  Pharmacy Consult for heparin Indication: chest pain/ACS  Allergies  Allergen Reactions  . Antihistamines, Chlorpheniramine-Type Other (See Comments)    Altered mental status  . Pheniramine Other (See Comments)    Altered mental status  . Statins Other (See Comments)    Leg cramps    . Zetia [Ezetimibe] Other (See Comments)    Leg cramps    Patient Measurements: Height: 5' 8.5" (174 cm) Weight: 168 lb (76.2 kg) IBW/kg (Calculated) : 69.55 Heparin Dosing Weight: 76kg  Vital Signs: Temp: 97.7 F (36.5 C) (11/21 1324) Temp Source: Oral (11/21 1324) BP: 149/100 (11/21 1324) Pulse Rate: 117 (11/21 1324)  Labs: No results for input(s): HGB, HCT, PLT, APTT, LABPROT, INR, HEPARINUNFRC, HEPRLOWMOCWT, CREATININE, CKTOTAL, CKMB, TROPONINI in the last 72 hours.  CrCl cannot be calculated (Patient's most recent lab result is older than the maximum 21 days allowed.).   Medical History: Past Medical History:  Diagnosis Date  . Arthritis   . Cancer (HCC)    SKIN CA  SQUAMOUS CELL  . Coronary artery disease    a. 2007 Stent to LAD, PTCA D1;  b. DES to LAD & LCx 10/13 in the setting of STEMI.  [name] Kitchen Dyslipidemia    a. Statin and zetia-intolerant. On niacin.  [name] Kitchen GERD (gastroesophageal reflux disease)   . Heart murmur   . Hypertension   . Hypertrophic cardiomyopathy ([place])    a. 09/2012 Echo: EF 55%, mid to apical anterolateral, posterior, apical anterior, apical HK, Gr1 DD, turbulence across LVOT suggesting degree of obstruction, mild MR, mildly dil LA.    Medications:  Infusions:  . heparin      Assessment: 35 yom presented to the ED with SOB and CP. To start IV heparin for r/o ACS. Current labs are pending but CBC has been normal in the past. He is not on anticoagulation PTA.   Goal of Therapy:  Heparin level 0.3-0.7 units/ml Monitor platelets by anticoagulation protocol: Yes   Plan:  Heparin bolus 4000 units IV x 1 Heparin  gtt 1050 units/hr Check a 6 hr heparin level Daily heparin level and CBC  Nguyen Todorov, Rande Lawman 10/24/2018,2:08 PM

## 2018-10-24 NOTE — ED Provider Notes (Signed)
Patient in no distress, awake, alert, no additional pain second troponin normal he has been seen by our cardiology colleagues, occasions have been adjusted, and he is deemed appropriate for close outpatient follow-up.   Brandon Muskrat, MD 10/24/18 [phone]

## 2018-10-24 NOTE — ED Notes (Signed)
Patient left at this time with all belongings. 

## 2018-10-24 NOTE — ED Provider Notes (Signed)
Medical screening examination/treatment/procedure(s) were conducted as a shared visit with non-physician practitioner(s) and myself.  I personally evaluated the patient during the encounter.  EKG Interpretation  Date/Time:  Thursday October 24 2018 13:24:15 EST Ventricular Rate:  115 PR Interval:    QRS Duration: 134 QT Interval:  347 QTC Calculation: 480 R Axis:   -84 Text Interpretation:  Sinus tachycardia Right bundle branch block Inferior infarct, old old RBBB. no STEMI. rate increased compared to previous Confirmed by Charlesetta Shanks [phone]) on 10/24/2018 1:49:50 PM Patient has extensive prior history of coronary artery disease with previous stents.  He reports that he had run up a hill to grab a shovel and got severe chest pain.  It was a pressure and tightness.  He reports he got very short of breath.  He had to sit down for about 15 minutes for the symptoms to start the past.  He reports he has had similar symptoms with heart attack in the past.  Patient is alert and appropriate.  No respiratory distress at rest.  Heart is regular 3\6 systolic ejection murmur heard best at mitral valve.  Lungs are clear no wheeze rhonchi or rale.  Abdomen soft and nontender.  No peripheral edema.  Calves soft and nontender.  Patient's mental status is clear.  He is alert with normal speech coordinated and purposeful movements and normal cognitive function.  Skin is warm and dry.  EKG has old right right bundle branch block with tachycardia.  Troponin pending.  Patient history is very suspicious for cardiac ischemic presentation.  Murmur is present.  Prior echo reviewed without evidence of significant mitral valve pathology.  Patient reports he had a murmur since childhood.  He does not show signs of immediate decompensating heart failure.  Cardiology has been consulted.  Heparin initiated.  Patient took 81 mg aspirin today.  162 mg added.     Charlesetta Shanks, MD 10/24/18 [phone]

## 2018-11-05 IMAGING — CR DG CHEST 2V
2 series · 2 of 2 positions shown · non-contrast
Comparison: 09/22/2012

CLINICAL DATA: Shortness of breath

EXAM:
CHEST  2 VIEW

[chest pa]
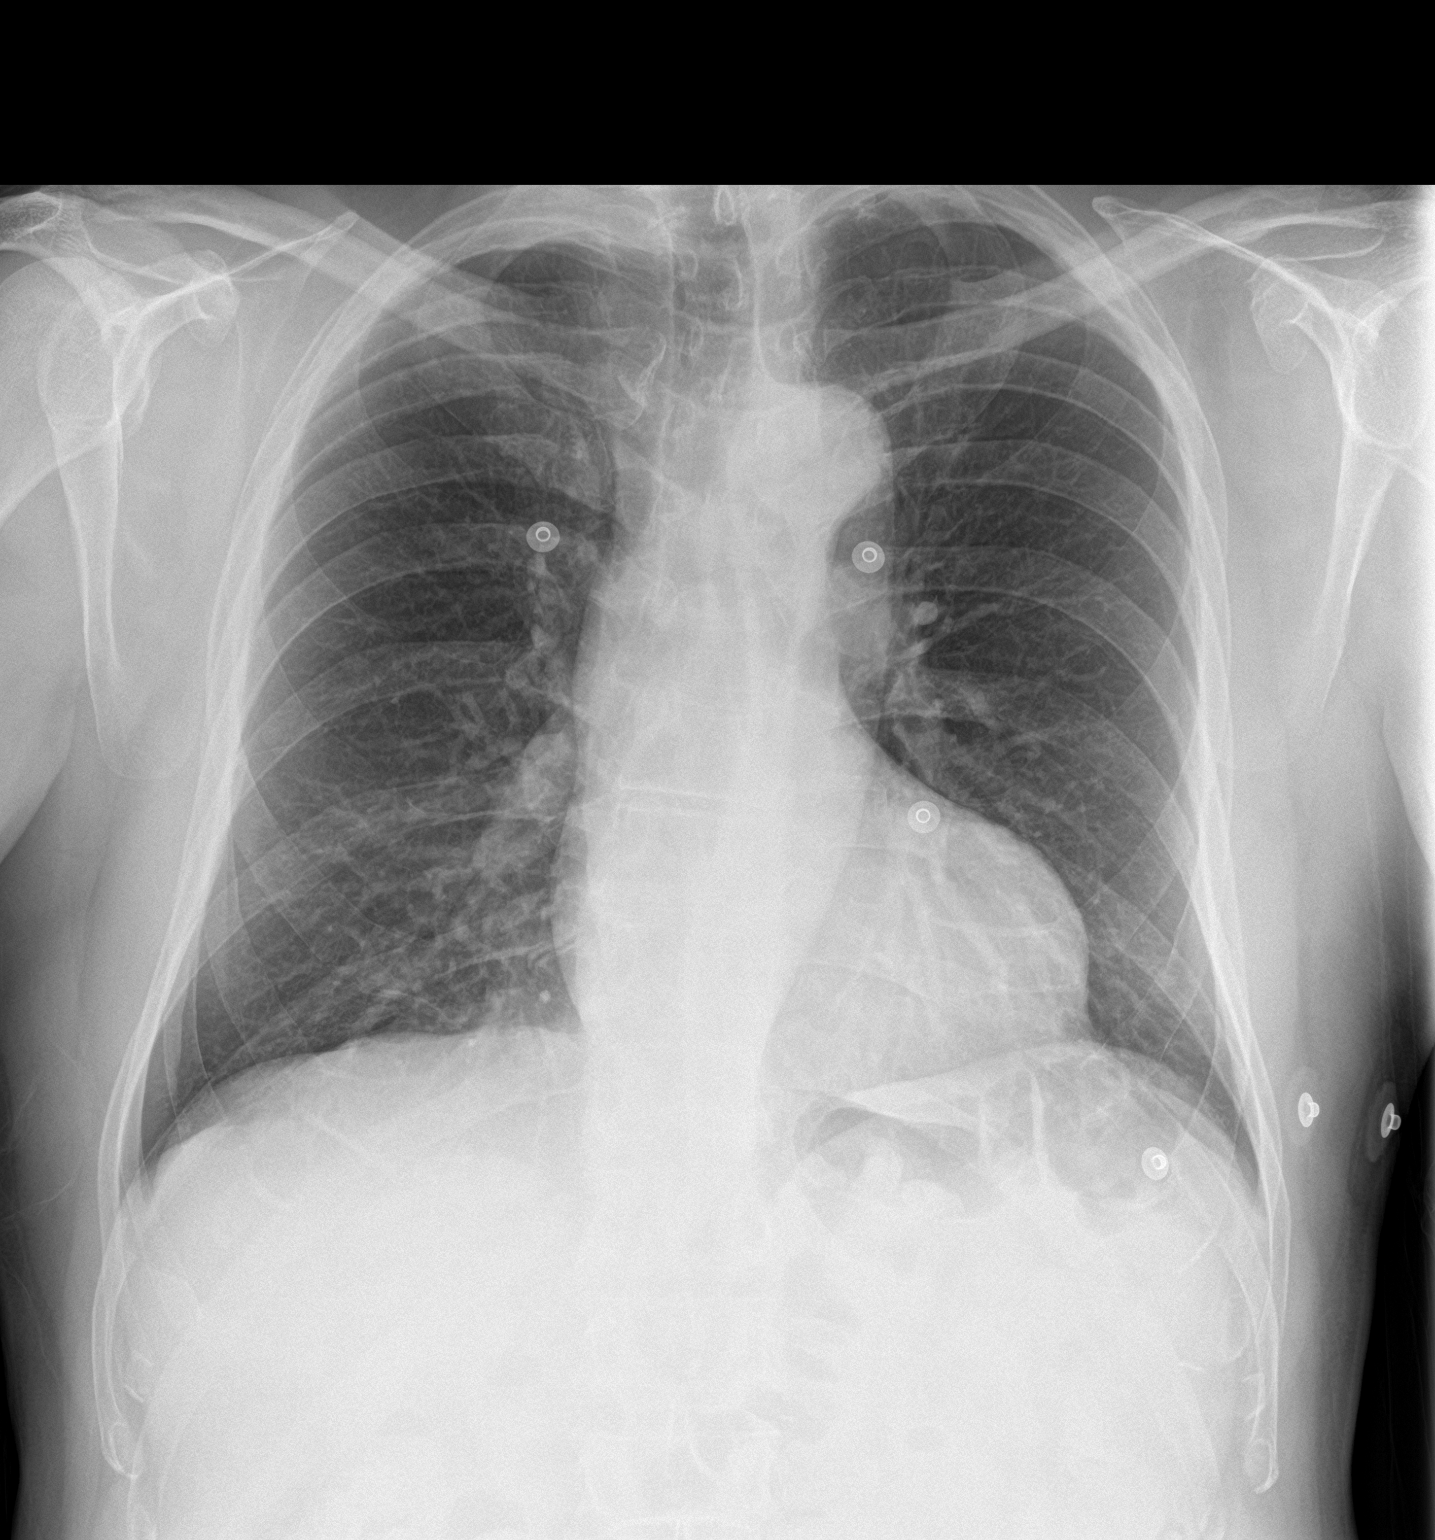

[chest lat]
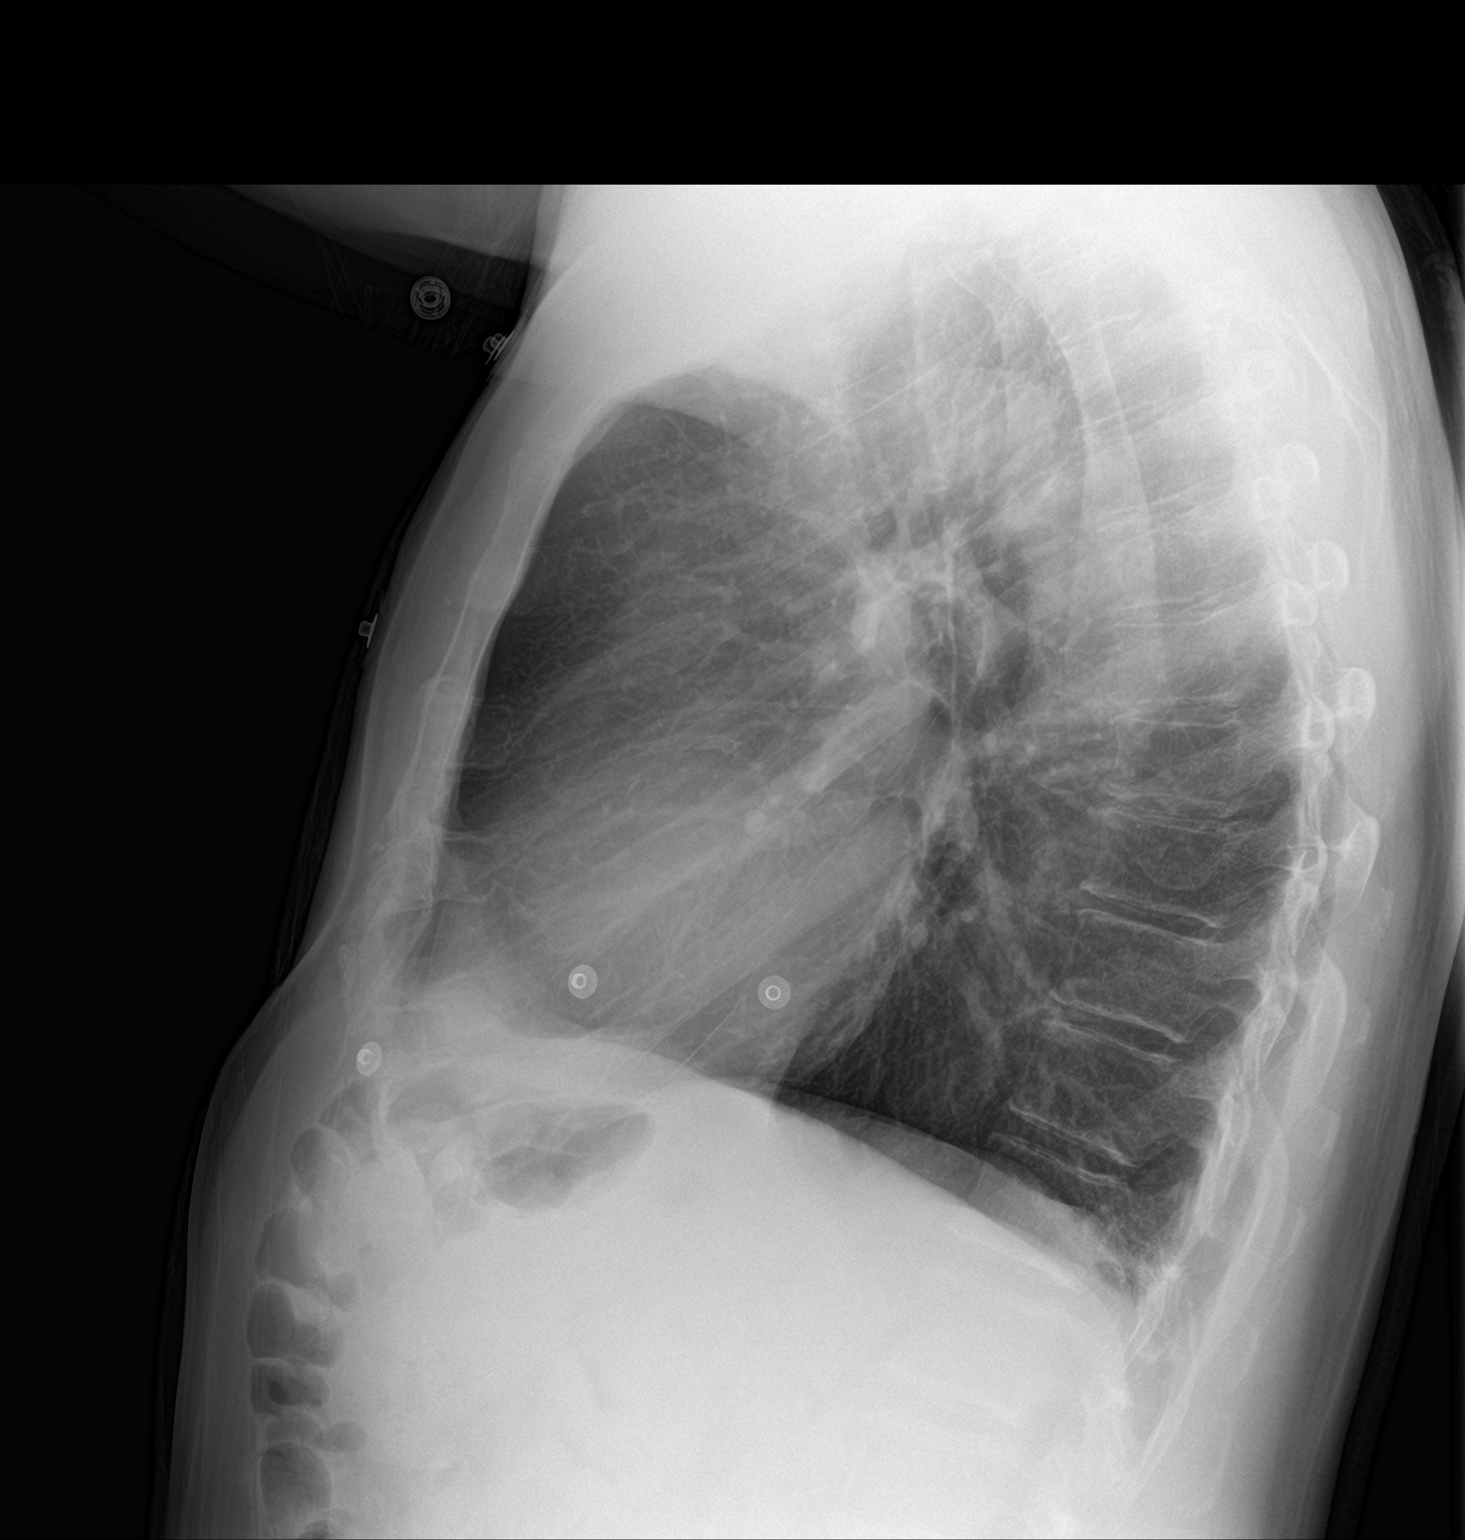

[2 of 2 positions shown; findings below may reference images not displayed]

FINDINGS: No focal pulmonary infiltrate, consolidation or effusion.
Cardiomediastinal silhouette within normal limits. No pneumothorax.
IMPRESSION: No active cardiopulmonary disease.

## 2018-11-21 ENCOUNTER — Encounter: Payer: Medicare HMO | Admitting: *Deleted

## 2018-11-22 NOTE — Research (Unsigned)
Late entry: November 21, 2018  Subject to research clinic for visit [id number] in the Clear Research study.  No cos or saes to report.  AE of ED visit for chest pain reported to sponsor. Subject 97% compliant with meds and new drug dispensed.  Next phone call and clinic visit scheduled.

## 2019-01-07 NOTE — Progress Notes (Signed)
Office Visit    Patient Name: ERMA JOUBERT Date of Encounter: 01/13/2019  Primary Care Provider:  [name], NP Primary Cardiologist:  [name], MD   Chief Complaint    68 y/o ? with a h/o CAD, HTN, hypertrophic cm, GERD, and HL, who presents for f/u of CAD.  History of Present Illness    69 y/o ? with  PMH including CAD prior mid LAD stenting in 2007 with  PTCA to D1, followed by lateral STEMI and PCI/DES to the midLCX & proximalLAD in 2013. S/p cutting balloon PTCA of the LAD in June 2019 for in stent restenosis.  He also has a h/o HTN, HL, GERD, and hypertrophic cardiomyopathy.    He was seen in January 2018 and noted to be hypertensive.  He had been checking his BP @ home and it has been variable - 150's to 190's.  His lisinopril was increased to 20 mg daily.  He called on 02/19/17 with complaints of dyspnea and dizziness that he states were similar to when he had his MI. He felt very flushed in his face. He was referred to the ED.  BP recorded by EMS was 210/107. Came down to 159/87 in ED.  Ecg showed NSR with a RBBB- new in January 2018. No acute change. Delta troponin was normal. UA, BMET, BNP were all normal. He was discharged on amlodipine 5 mg daily.  He subsequently developed hypotension and amlodipine was discontinued.   He was evaluated with Echo in April 2018 showing moderate LVH with severe basal septal hypertrophy. No significant outflow gradient. Mild MR. Myoview study showed a small area of scar at the base of the lateral wall. No ischemia. EF 51%. He presented in June 2018 with symptoms of worsening chest pain and dyspnea on exertion. He underwent cardiac cath showing 95% in stent restenosis of the proximal LAD treated with cutting  balloon angioplasty. There was a 65% mid RCA stenosis and 50% in the mid LCx. He was enrolled in the CLEAR trial for hyperlipidemia given history of statin intolerance.   In October 2019 he complained of feeling tired in the  afternoon. Underwent colonoscopy that showed some diverticuli but was otherwise benign. He also had a melanoma removed from his right ear and reconstructive surgery.  He was seen in the ED on November 2019 with a 15 minute episode of chest pain after running up a hill. Ecg was unchanged. troponins were negative. He was DC home with  increase in Coreg.   On follow up today he feels great. No chest pain, dyspnea, palpitations, dizziness. Reports good control of BP. Typically 120/78. Hasn't been walking as much in the cold weather. Planning to resume exercise and try and lose weight.   Past Medical History    Past Medical History:  Diagnosis Date  . Arthritis   . Cancer (HCC)    SKIN CA  SQUAMOUS CELL  . Coronary artery disease    a. 2007 Stent to LAD, PTCA D1;  b. DES to LAD & LCx 10/13 in the setting of STEMI.  [name] Kitchen Dyslipidemia    a. Statin and zetia-intolerant. On niacin.  [name] Kitchen GERD (gastroesophageal reflux disease)   . Heart murmur   . Hypertension   . Hypertrophic cardiomyopathy ([place])    a. 09/2012 Echo: EF 55%, mid to apical anterolateral, posterior, apical anterior, apical HK, Gr1 DD, turbulence across LVOT suggesting degree of obstruction, mild MR, mildly dil LA.   Past Surgical History:  Procedure Laterality Date  . CARDIAC CATHETERIZATION  2007   stent to LAD and PTCA of diagonal branch  . CARDIAC CATHETERIZATION  2010  . CORONARY ANGIOPLASTY WITH STENT PLACEMENT  2013   95-99% prox LAD, patent LAD stent distal to this, occluded mid-dist LCx, mild <10% RCA irreg; s/p DES-prox LAD & DES to mid-dist LCx  . CORONARY BALLOON ANGIOPLASTY N/A 05/16/2017   Procedure: Coronary Balloon Angioplasty;  Surgeon: Leonie Man, MD;  Location: [facility];  Service: Cardiovascular;  Laterality: N/A;  . [place]   right  . [place]   left  . LEFT HEART CATH Bilateral 09/22/2012   Procedure: LEFT HEART CATH;  Surgeon: Kiandra Sanguinetti M Martinique, MD;  Location: [facility] CATH LAB;   Service: Cardiovascular;  Laterality: Bilateral;  . LEFT HEART CATH AND CORONARY ANGIOGRAPHY N/A 05/16/2017   Procedure: Left Heart Cath and Coronary Angiography;  Surgeon: Leonie Man, MD;  Location: [place] [facility];  Service: Cardiovascular;  Laterality: N/A;  . PERCUTANEOUS CORONARY STENT INTERVENTION (PCI-S) Right 09/22/2012   Procedure: PERCUTANEOUS CORONARY STENT INTERVENTION (PCI-S);  Surgeon: Pernell Dikes M Martinique, MD;  Location: [facility] CATH LAB;  Service: Cardiovascular;  Laterality: Right;    Allergies  Allergies  Allergen Reactions  . Antihistamines, Chlorpheniramine-Type Other (See Comments)    Altered mental status  . Pheniramine Other (See Comments)    Altered mental status  . Statins Other (See Comments)    Leg cramps    . Zetia [Ezetimibe] Other (See Comments)    Leg cramps     Home Medications    Allergies as of 01/13/2019      Reactions   Antihistamines, Chlorpheniramine-type Other (See Comments)   Altered mental status   Pheniramine Other (See Comments)   Altered mental status   Statins Other (See Comments)   Leg cramps     Zetia [ezetimibe] Other (See Comments)   Leg cramps      Medication List       Accurate as of January 13, 2019  2:02 PM. Always use your most recent med list.        AMBULATORY NON FORMULARY MEDICATION Take 180 mg by mouth daily. Medication Name: bempedoic acid vs a placebo CLEAR Research Study drug provided   amLODipine 2.5 MG tablet Commonly known as:  NORVASC TAKE 1 TABLET BY MOUTH EVERY DAY   aspirin EC 81 MG tablet Take 81 mg by mouth daily.   calcium carbonate 750 MG chewable tablet Commonly known as:  TUMS EX Chew 2 tablets by mouth daily as needed for heartburn.   carvedilol 12.5 MG tablet Commonly known as:  COREG Take 1 tablet (12.5 mg total) by mouth 2 (two) times daily.   lisinopril 20 MG tablet Commonly known as:  PRINIVIL,ZESTRIL Take 20 mg by mouth 2 (two) times daily.   NIACIN FLUSH FREE 500 MG  Caps Generic drug:  Inositol Niacinate Take 1,000 mg by mouth daily.   nitroGLYCERIN 0.4 MG SL tablet Commonly known as:  NITROSTAT Place 1 tablet (0.4 mg total) under the tongue every 5 (five) minutes as needed for chest pain.   valACYclovir 500 MG tablet Commonly known as:  VALTREX Take 1 tablet (500 mg total) by mouth daily.       Review of Systems    As noted in HPI.  All other systems reviewed and are otherwise negative except as noted above.  Physical Exam    VS:  BP 140/80  Pulse 77   Ht 5' 8.5" (1.74 m)   Wt 178 lb 6.4 oz (80.9 kg)   SpO2 98%   BMI 26.73 kg/m  , BMI Body mass index is 26.73 kg/m. GENERAL:  Well appearing WM in NAD HEENT:  PERRL, EOMI, sclera are clear. Oropharynx is clear. NECK:  No jugular venous distention, carotid upstroke brisk and symmetric, no bruits, no thyromegaly or adenopathy LUNGS:  Clear to auscultation bilaterally CHEST:  Unremarkable HEART:  RRR,  PMI not displaced or sustained,S1 and S2 within normal limits, no S3, no S4: no clicks, no rubs, harsh gr 3/6 systolic murmur loudest at the apex across the precordium. ABD:  Soft, nontender. BS +, no masses or bruits. No hepatomegaly, no splenomegaly EXT:  2 + pulses throughout, no edema, no cyanosis no clubbing SKIN:  Warm and dry.  No rashes NEURO:  Alert and oriented x 3. Cranial nerves II through XII intact. PSYCH:  Cognitively intact  Accessory Clinical Findings    Lab Results  Component Value Date   WBC 7.4 10/24/2018   HGB 15.9 10/24/2018   HCT 48.9 10/24/2018   PLT 186 10/24/2018   GLUCOSE 125 (H) 10/24/2018   CHOL 183 02/22/2018   TRIG 122 02/22/2018   HDL 31 (L) 02/22/2018   LDLDIRECT 159.5 10/01/2012   LDLCALC 128 (H) 02/22/2018   ALT 21 02/22/2018   AST 19 02/22/2018   NA 141 10/24/2018   K 4.4 10/24/2018   CL 108 10/24/2018   CREATININE 1.46 (H) 10/24/2018   BUN 22 10/24/2018   CO2 24 10/24/2018   TSH 4.420 02/22/2018   PSA 1.4 09/13/2010   INR 1.06  05/16/2017   HGBA1C 5.0 02/22/2018     Assessment & Plan    1.  Hypertension:  BP is under good control today .   we will continue current therapy.   2.  CAD: s/p prior LAD, D1, and LCX PCI with STEMI in 2013. Admitted in June 2018 with in stent restenosis of the proximal LAD. Symptoms predominantly of DOE. Treated with cutting balloon PCI. Interestedly his Myoview study prior was normal so may not be a reliable study.  Continue ASA and Coreg. He is asymptomatic.   3.  HL:   intolerant to statins and zetia. On CLEAR trial now. Last LDL 128. Will repeat fasting lab in 6 months.  4. Hypertrophic CM:  euvolemic on exam.  Loud murmur noted due to [facility] and LVOT gradient. Will update Echo in 6 months. Discussed screening of family members.      Sarrah Fiorenza Martinique, MD,FACC 01/13/2019, 2:02 PM

## 2019-01-13 ENCOUNTER — Other Ambulatory Visit: Payer: Self-pay

## 2019-01-13 ENCOUNTER — Encounter: Payer: Self-pay | Admitting: Cardiology

## 2019-01-13 ENCOUNTER — Ambulatory Visit: Payer: Medicare HMO | Admitting: Cardiology

## 2019-01-13 VITALS — BP 140/80 | HR 77 | Ht 68.5 in | Wt 178.4 lb

## 2019-01-13 DIAGNOSIS — I10 Essential (primary) hypertension: Secondary | ICD-10-CM

## 2019-01-13 DIAGNOSIS — I422 Other hypertrophic cardiomyopathy: Secondary | ICD-10-CM | POA: Diagnosis not present

## 2019-01-13 DIAGNOSIS — I25118 Atherosclerotic heart disease of native coronary artery with other forms of angina pectoris: Secondary | ICD-10-CM | POA: Diagnosis not present

## 2019-01-13 DIAGNOSIS — R0609 Other forms of dyspnea: Secondary | ICD-10-CM

## 2019-01-13 DIAGNOSIS — R0600 Dyspnea, unspecified: Secondary | ICD-10-CM

## 2019-01-13 DIAGNOSIS — E782 Mixed hyperlipidemia: Secondary | ICD-10-CM | POA: Diagnosis not present

## 2019-01-13 MED ORDER — NITROGLYCERIN 0.4 MG SL SUBL: 0.4000 mg | SUBLINGUAL_TABLET | SUBLINGUAL | 3 refills | Status: DC | PRN

## 2019-01-19 ENCOUNTER — Other Ambulatory Visit: Payer: Self-pay | Admitting: Cardiology

## 2019-01-28 ENCOUNTER — Other Ambulatory Visit: Payer: Self-pay | Admitting: Cardiology

## 2019-03-12 ENCOUNTER — Telehealth: Payer: Self-pay | Admitting: *Deleted

## 2019-03-12 NOTE — Telephone Encounter (Signed)
Spoke with patient for visit [id number] in the Clear research study.  No cos, aes or saes to report.   Confirmed next appt in June and talked about plans to ship meds to patient if still under quarantine and reduced clinic visits.  Office number provided for subject in case needs to reach office.

## 2019-04-01 DIAGNOSIS — L821 Other seborrheic keratosis: Secondary | ICD-10-CM | POA: Diagnosis not present

## 2019-04-01 DIAGNOSIS — D045 Carcinoma in situ of skin of trunk: Secondary | ICD-10-CM | POA: Diagnosis not present

## 2019-04-01 DIAGNOSIS — L812 Freckles: Secondary | ICD-10-CM | POA: Diagnosis not present

## 2019-04-01 DIAGNOSIS — D485 Neoplasm of uncertain behavior of skin: Secondary | ICD-10-CM | POA: Diagnosis not present

## 2019-04-01 DIAGNOSIS — Z85820 Personal history of malignant melanoma of skin: Secondary | ICD-10-CM | POA: Diagnosis not present

## 2019-04-01 DIAGNOSIS — Z85828 Personal history of other malignant neoplasm of skin: Secondary | ICD-10-CM | POA: Diagnosis not present

## 2019-04-01 DIAGNOSIS — L570 Actinic keratosis: Secondary | ICD-10-CM | POA: Diagnosis not present

## 2019-05-20 ENCOUNTER — Telehealth: Payer: Self-pay | Admitting: *Deleted

## 2019-05-20 NOTE — Telephone Encounter (Signed)
Spoke with patient for visit [phone] in the clear research study.  Received verbal consent to fedex drug to patient's home.  No cos, aes or saes to report.  Next phone call and clinic visit confirmed.

## 2019-05-27 DIAGNOSIS — Z85828 Personal history of other malignant neoplasm of skin: Secondary | ICD-10-CM | POA: Diagnosis not present

## 2019-05-27 DIAGNOSIS — L570 Actinic keratosis: Secondary | ICD-10-CM | POA: Diagnosis not present

## 2019-06-08 ENCOUNTER — Other Ambulatory Visit: Payer: Self-pay | Admitting: Cardiology

## 2019-07-14 ENCOUNTER — Ambulatory Visit (HOSPITAL_COMMUNITY): Payer: Medicare HMO | Attending: Cardiology

## 2019-07-14 ENCOUNTER — Other Ambulatory Visit: Payer: Self-pay

## 2019-07-14 DIAGNOSIS — R0600 Dyspnea, unspecified: Secondary | ICD-10-CM

## 2019-07-14 DIAGNOSIS — I422 Other hypertrophic cardiomyopathy: Secondary | ICD-10-CM

## 2019-07-14 DIAGNOSIS — R0609 Other forms of dyspnea: Secondary | ICD-10-CM

## 2019-07-14 DIAGNOSIS — I25118 Atherosclerotic heart disease of native coronary artery with other forms of angina pectoris: Secondary | ICD-10-CM

## 2019-07-17 ENCOUNTER — Other Ambulatory Visit: Payer: Self-pay | Admitting: Cardiology

## 2019-07-25 DIAGNOSIS — M722 Plantar fascial fibromatosis: Secondary | ICD-10-CM | POA: Insufficient documentation

## 2019-08-30 ENCOUNTER — Ambulatory Visit
Admission: EM | Admit: 2019-08-30 | Discharge: 2019-08-30 | Disposition: A | Discharge status: Home / Self Care | Payer: Medicare HMO | Attending: Emergency Medicine | Admitting: Emergency Medicine

## 2019-08-30 ENCOUNTER — Encounter: Payer: Self-pay | Admitting: Emergency Medicine

## 2019-08-30 ENCOUNTER — Other Ambulatory Visit: Payer: Self-pay

## 2019-08-30 DIAGNOSIS — B001 Herpesviral vesicular dermatitis: Secondary | ICD-10-CM | POA: Diagnosis not present

## 2019-08-30 DIAGNOSIS — B002 Herpesviral gingivostomatitis and pharyngotonsillitis: Secondary | ICD-10-CM

## 2019-08-30 MED ORDER — VALACYCLOVIR HCL 500 MG PO TABS: 500.0000 mg | ORAL_TABLET | Freq: Every day | ORAL | 0 refills | Status: DC

## 2019-08-30 NOTE — ED Triage Notes (Signed)
Pt hx of fever blisters and out of his Valtrex, needs refill. Primary is closed today.

## 2019-08-30 NOTE — Discharge Instructions (Addendum)
Take valtrex: 1 tab 2 times a day x 3 days and then resume normal daily dose.

## 2019-08-30 NOTE — ED Provider Notes (Signed)
[facility]    CSN: [id number] Arrival date & time: 08/30/19  1159      History   Chief Complaint No chief complaint on file.   HPI Brandon Gonzales is a 68 y.o. male with history of herpes labialis presenting for herpetic outbreak since Friday on left side of lower lip.  States that he is on chronic valacyclovir, though ran out a few months ago.  Was tolerating daily dose well prior to this.  Denies fever, cough, odynophagia, choking.  Denies purulent discharge, trauma to the area.  Been using OTC analgesia with moderate relief of symptoms.   Past Medical History:  Diagnosis Date  . Arthritis   . Cancer (HCC)    SKIN CA  SQUAMOUS CELL  . Coronary artery disease    a. 2007 Stent to LAD, PTCA D1;  b. DES to LAD & LCx 10/13 in the setting of STEMI.  [name] Kitchen Dyslipidemia    a. Statin and zetia-intolerant. On niacin.  [name] Kitchen GERD (gastroesophageal reflux disease)   . Heart murmur   . Hypertension   . Hypertrophic cardiomyopathy ([place])    a. 09/2012 Echo: EF 55%, mid to apical anterolateral, posterior, apical anterior, apical HK, Gr1 DD, turbulence across LVOT suggesting degree of obstruction, mild MR, mildly dil LA.    Patient Active Problem List   Diagnosis Date Noted  . Herpes labialis 01/31/2018  . Healthcare maintenance 01/31/2018  . Hypertension   . Heart murmur   . GERD (gastroesophageal reflux disease)   . Coronary artery disease   . Cancer ([place])   . Angina at rest [facility]) 05/17/2017  . Hypertrophic cardiomyopathy ([place])   . S/P PTCA (percutaneous transluminal coronary angioplasty)   . Chest pain 05/16/2017  . Unstable angina ([place])   . Dyspnea on exertion 02/21/2017  . Neck pain 12/28/2016  . NSVT (nonsustained ventricular tachycardia) ([place]) 09/25/2012  . Hypertrophic obstructive cardiomyopathy(425.11) 09/25/2012  . CKD (chronic kidney disease) stage 3, GFR 30-59 ml/min (HCC) 09/25/2012  . ST elevation myocardial infarction (STEMI) of lateral wall ([place]) 09/22/2012   . CAD (coronary artery disease) 01/11/2012  . HTN (hypertension) 01/11/2012  . Dyslipidemia   . H/O gastroesophageal reflux (GERD)   . Arthritis     Past Surgical History:  Procedure Laterality Date  . CARDIAC CATHETERIZATION  2007   stent to LAD and PTCA of diagonal branch  . CARDIAC CATHETERIZATION  2010  . CORONARY ANGIOPLASTY WITH STENT PLACEMENT  2013   95-99% prox LAD, patent LAD stent distal to this, occluded mid-dist LCx, mild <10% RCA irreg; s/p DES-prox LAD & DES to mid-dist LCx  . CORONARY BALLOON ANGIOPLASTY N/A 05/16/2017   Procedure: Coronary Balloon Angioplasty;  Surgeon: Leonie Man, MD;  Location: [place] [facility];  Service: Cardiovascular;  Laterality: N/A;  . [place]   right  . [place]   left  . LEFT HEART CATH Bilateral 09/22/2012   Procedure: LEFT HEART CATH;  Surgeon: Peter M Martinique, MD;  Location: [facility] CATH LAB;  Service: Cardiovascular;  Laterality: Bilateral;  . LEFT HEART CATH AND CORONARY ANGIOGRAPHY N/A 05/16/2017   Procedure: Left Heart Cath and Coronary Angiography;  Surgeon: Leonie Man, MD;  Location: [place] [facility];  Service: Cardiovascular;  Laterality: N/A;  . PERCUTANEOUS CORONARY STENT INTERVENTION (PCI-S) Right 09/22/2012   Procedure: PERCUTANEOUS CORONARY STENT INTERVENTION (PCI-S);  Surgeon: Peter M Martinique, MD;  Location: [facility] CATH LAB;  Service: Cardiovascular;  Laterality: Right;  Home Medications    Prior to Admission medications   Medication Sig Start Date End Date Taking? Authorizing Provider  AMBULATORY NON FORMULARY MEDICATION Take 180 mg by mouth daily. Medication Name: bempedoic acid vs a placebo CLEAR Research Study drug provided 05/02/17   Lelon Perla, MD  amLODipine (NORVASC) 2.5 MG tablet TAKE 1 TABLET BY MOUTH EVERY DAY 06/09/19   Martinique, Peter M, MD  aspirin EC 81 MG tablet Take 81 mg by mouth daily.    [provider]  calcium carbonate (TUMS EX) 750 MG chewable tablet  Chew 2 tablets by mouth daily as needed for heartburn.    [provider]  carvedilol (COREG) 12.5 MG tablet Take 1 tablet (12.5 mg total) by mouth 2 (two) times daily. 10/24/18   Carmin Muskrat, MD  carvedilol (COREG) 12.5 MG tablet Take 0.5 tablets (6.25 mg total) by mouth 2 (two) times daily. 01/29/19   Martinique, Peter M, MD  Inositol Niacinate (NIACIN FLUSH FREE) 500 MG CAPS Take 1,000 mg by mouth daily.    [provider]  lisinopril (ZESTRIL) 20 MG tablet TAKE 2 TABLETS BY MOUTH EVERY DAY 07/17/19   Martinique, Peter M, MD  nitroGLYCERIN (NITROSTAT) 0.4 MG SL tablet Place 1 tablet (0.4 mg total) under the tongue every 5 (five) minutes as needed for chest pain. 01/13/19 04/13/19  Martinique, Peter M, MD  valACYclovir (VALTREX) 500 MG tablet Take 1 tablet (500 mg total) by mouth daily. 08/30/19   Hall-Potvin, [place], PA-C    Family History Family History  Problem Relation Age of Onset  . Heart attack Mother 25  . Heart disease Mother     Social History Social History   Tobacco Use  . Smoking status: Never Smoker  . Smokeless tobacco: Never Used  Substance Use Topics  . Alcohol use: No  . Drug use: No     Allergies   Antihistamines, chlorpheniramine-type; Pheniramine; Statins; and Zetia [ezetimibe]   Review of Systems Review of Systems  Constitutional: Negative for fatigue and fever.  Respiratory: Negative for cough and shortness of breath.   Cardiovascular: Negative for chest pain and palpitations.  Gastrointestinal: Negative for abdominal pain, diarrhea and vomiting.  Musculoskeletal: Negative for arthralgias and myalgias.  Skin: Positive for rash. Negative for wound.  Neurological: Negative for speech difficulty and headaches.  All other systems reviewed and are negative.    Physical Exam Triage Vital Signs ED Triage Vitals  Enc Vitals Group     BP 08/30/19 1212 134/90     Pulse Rate 08/30/19 1212 69     Resp 08/30/19 1212 16     Temp 08/30/19 1212  97.8 F (36.6 C)     Temp Source 08/30/19 1212 Oral     SpO2 08/30/19 1212 96 %     Weight --      Height --      Head Circumference --      Peak Flow --      Pain Score 08/30/19 1216 1     Pain Loc --      Pain Edu? --      Excl. in [place]? --    No data found.  Updated Vital Signs BP 134/90 (BP Location: Right Arm)   Pulse 69   Temp 97.8 F (36.6 C) (Oral)   Resp 16   SpO2 96%   Visual Acuity Right Eye Distance:   Left Eye Distance:   Bilateral Distance:    Right Eye Near:   Left  Eye Near:    Bilateral Near:     Physical Exam Constitutional:      General: He is not in acute distress. HENT:     Head: Normocephalic and atraumatic.  Eyes:     General: No scleral icterus.    Pupils: Pupils are equal, round, and reactive to light.  Cardiovascular:     Rate and Rhythm: Normal rate.  Pulmonary:     Effort: Pulmonary effort is normal.  Skin:    Coloration: Skin is not jaundiced or pale.     Comments: Left side of lower lip with erythema, slight raising of skin that is tender to palpation.  No opening or after discharge.  Neurological:     Mental Status: He is alert and oriented to person, place, and time.      UC Treatments / Results  Labs (all labs ordered are listed, but only abnormal results are displayed) Labs Reviewed - No data to display  EKG   Radiology No results found.  Procedures Procedures (including critical care time)  Medications Ordered in UC Medications - No data to display  Initial Impression / Assessment and Plan / UC Course  I have reviewed the triage vital signs and the nursing notes.  Pertinent labs & imaging results that were available during my care of the patient were reviewed by me and considered in my medical decision making (see chart for details).     1.  Recurrent oral herpes simplex Valtrex prescription refilled: Advised due to acute flare he should take twice daily x3 days, then resume normal daily dose.  Patient to  follow-up with PCP for further refills/management.  Return precautions discussed, patient verbalized understanding and is agreeable to plan. Final Clinical Impressions(s) / UC Diagnoses   Final diagnoses:  Recurrent oral herpes simplex     Discharge Instructions     Take valtrex: 1 tab 2 times a day x 3 days and then resume normal daily dose.   ED Prescriptions    Medication Sig Dispense Auth. Provider   valACYclovir (VALTREX) 500 MG tablet Take 1 tablet (500 mg total) by mouth daily. 90 tablet Hall-Potvin, [place], PA-C     PDMP not reviewed this encounter.   Hall-Potvin, [place], [place] 08/30/19 1234

## 2019-08-31 ENCOUNTER — Telehealth: Payer: Self-pay

## 2019-08-31 NOTE — Telephone Encounter (Signed)
Left message for patient and encouraged him to return call with any continuing questions or concerns.    Roberts Gaudy, RN

## 2019-09-10 DIAGNOSIS — I422 Other hypertrophic cardiomyopathy: Secondary | ICD-10-CM | POA: Diagnosis not present

## 2019-09-10 DIAGNOSIS — I10 Essential (primary) hypertension: Secondary | ICD-10-CM | POA: Diagnosis not present

## 2019-09-10 DIAGNOSIS — E782 Mixed hyperlipidemia: Secondary | ICD-10-CM | POA: Diagnosis not present

## 2019-09-10 DIAGNOSIS — I25118 Atherosclerotic heart disease of native coronary artery with other forms of angina pectoris: Secondary | ICD-10-CM | POA: Diagnosis not present

## 2019-09-10 LAB — BASIC METABOLIC PANEL
BUN/Creatinine Ratio: 14 (ref 10–24)
BUN: 19 mg/dL (ref 8–27)
CO2: 25 mmol/L (ref 20–29)
Calcium: 9.4 mg/dL (ref 8.6–10.2)
Chloride: 105 mmol/L (ref 96–106)
Creatinine, Ser: 1.33 mg/dL — ABNORMAL HIGH (ref 0.76–1.27)
GFR calc Af Amer: 62 mL/min/{1.73_m2} (ref >59)
GFR calc non Af Amer: 54 mL/min/{1.73_m2} — ABNORMAL LOW (ref >59)
Glucose: 89 mg/dL (ref 65–99)
Potassium: 4.7 mmol/L (ref 3.5–5.2)
Sodium: 143 mmol/L (ref 134–144)

## 2019-09-10 LAB — LIPID PANEL
Chol/HDL Ratio: 6.3 ratio — ABNORMAL HIGH (ref 0.0–5.0)
Cholesterol, Total: 195 mg/dL (ref 100–199)
HDL: 31 mg/dL — ABNORMAL LOW (ref >39)
LDL Chol Calc (NIH): 138 mg/dL — ABNORMAL HIGH (ref 0–99)
Triglycerides: 145 mg/dL (ref 0–149)
VLDL Cholesterol Cal: 26 mg/dL (ref 5–40)

## 2019-09-10 LAB — HEPATIC FUNCTION PANEL
ALT: 24 IU/L (ref 0–44)
AST: 25 IU/L (ref 0–40)
Albumin: 4.4 g/dL (ref 3.8–4.8)
Alkaline Phosphatase: 63 IU/L (ref 39–117)
Bilirubin Total: 1.6 mg/dL — ABNORMAL HIGH (ref 0.0–1.2)
Bilirubin, Direct: 0.28 mg/dL (ref 0.00–0.40)
Total Protein: 6.1 g/dL (ref 6.0–8.5)

## 2019-09-15 NOTE — Progress Notes (Signed)
Office Visit    Patient Name: Brandon Gonzales Date of Encounter: 09/17/2019  Primary Care Provider:  [name], NP Primary Cardiologist:  [name], MD   Chief Complaint    68 y/o ? with a h/o CAD, HTN, hypertrophic cm, GERD, and HL, who presents for f/u of CAD.  History of Present Illness    68 y/o ? with  PMH including CAD prior mid LAD stenting in 2007 with  PTCA to D1, followed by lateral STEMI and PCI/DES to the midLCX & proximalLAD in 2013. S/p cutting balloon PTCA of the LAD in June 2019 for in stent restenosis.  He also has a h/o HTN, HL, GERD, and hypertrophic cardiomyopathy.    He called on 02/19/17 with complaints of dyspnea and dizziness that he states were similar to when he had his MI. He felt very flushed in his face. He was referred to the ED.  BP recorded by EMS was 210/107. Came down to 159/87 in ED.  Ecg showed NSR with a RBBB- new in January 2018. No acute change. Delta troponin was normal. UA, BMET, BNP were all normal. He was discharged on amlodipine 5 mg daily.   He was evaluated with Echo in April 2018 showing moderate LVH with severe basal septal hypertrophy. No significant outflow gradient. Mild MR. Myoview study showed a small area of scar at the base of the lateral wall. No ischemia. EF 51%. He presented in June 2018 with symptoms of worsening chest pain and dyspnea on exertion. He underwent cardiac cath showing 95% in stent restenosis of the proximal LAD treated with cutting  balloon angioplasty. There was a 65% mid RCA stenosis and 50% in the mid LCx. He was enrolled in the CLEAR trial for hyperlipidemia given history of statin and Zetia intolerance.   He was seen in the ED on November 2019 with a 15 minute episode of chest pain after running up a hill. Ecg was unchanged. troponins were negative. He was DC home with  increase in Coreg.   On follow up today he feels great. No chest pain, dyspnea, palpitations, dizziness. Notes he ran out of his amlodipine  over a week ago. Hasn't been checking BP at home.  Notes difficulty exercising due to plantar fasciitis. Is active around the house.   Past Medical History    Past Medical History:  Diagnosis Date  . Arthritis   . Cancer (HCC)    SKIN CA  SQUAMOUS CELL  . Coronary artery disease    a. 2007 Stent to LAD, PTCA D1;  b. DES to LAD & LCx 10/13 in the setting of STEMI.  [name] Kitchen Dyslipidemia    a. Statin and zetia-intolerant. On niacin.  [name] Kitchen GERD (gastroesophageal reflux disease)   . Heart murmur   . Hypertension   . Hypertrophic cardiomyopathy ([place])    a. 09/2012 Echo: EF 55%, mid to apical anterolateral, posterior, apical anterior, apical HK, Gr1 DD, turbulence across LVOT suggesting degree of obstruction, mild MR, mildly dil LA.   Past Surgical History:  Procedure Laterality Date  . CARDIAC CATHETERIZATION  2007   stent to LAD and PTCA of diagonal branch  . CARDIAC CATHETERIZATION  2010  . CORONARY ANGIOPLASTY WITH STENT PLACEMENT  2013   95-99% prox LAD, patent LAD stent distal to this, occluded mid-dist LCx, mild <10% RCA irreg; s/p DES-prox LAD & DES to mid-dist LCx  . CORONARY BALLOON ANGIOPLASTY N/A 05/16/2017   Procedure: Coronary Balloon Angioplasty;  Surgeon: Ellyn Hack,  Leonie Green, MD;  Location: [place] [facility];  Service: Cardiovascular;  Laterality: N/A;  . [place]   right  . [place]   left  . LEFT HEART CATH Bilateral 09/22/2012   Procedure: LEFT HEART CATH;  Surgeon:  M Martinique, MD;  Location: [facility] CATH LAB;  Service: Cardiovascular;  Laterality: Bilateral;  . LEFT HEART CATH AND CORONARY ANGIOGRAPHY N/A 05/16/2017   Procedure: Left Heart Cath and Coronary Angiography;  Surgeon: Leonie Man, MD;  Location: [place] [facility];  Service: Cardiovascular;  Laterality: N/A;  . PERCUTANEOUS CORONARY STENT INTERVENTION (PCI-S) Right 09/22/2012   Procedure: PERCUTANEOUS CORONARY STENT INTERVENTION (PCI-S);  Surgeon:  M Martinique, MD;  Location: [facility] CATH LAB;   Service: Cardiovascular;  Laterality: Right;    Allergies  Allergies  Allergen Reactions  . Antihistamines, Chlorpheniramine-Type Other (See Comments)    Altered mental status  . Pheniramine Other (See Comments)    Altered mental status  . Statins Other (See Comments)    Leg cramps    . Zetia [Ezetimibe] Other (See Comments)    Leg cramps     Home Medications    Allergies as of 09/17/2019      Reactions   Antihistamines, Chlorpheniramine-type Other (See Comments)   Altered mental status   Pheniramine Other (See Comments)   Altered mental status   Statins Other (See Comments)   Leg cramps     Zetia [ezetimibe] Other (See Comments)   Leg cramps      Medication List       Accurate as of September 17, 2019 11:14 AM. If you have any questions, ask your nurse or doctor.        AMBULATORY NON FORMULARY MEDICATION Take 180 mg by mouth daily. Medication Name: bempedoic acid vs a placebo CLEAR Research Study drug provided   amLODipine 2.5 MG tablet Commonly known as: NORVASC TAKE 1 TABLET BY MOUTH EVERY DAY   aspirin EC 81 MG tablet Take 81 mg by mouth daily.   calcium carbonate 750 MG chewable tablet Commonly known as: TUMS EX Chew 2 tablets by mouth daily as needed for heartburn.   carvedilol 12.5 MG tablet Commonly known as: COREG Take 0.5 tablets (6.25 mg total) by mouth 2 (two) times daily. What changed: Another medication with the same name was removed. Continue taking this medication, and follow the directions you see here. Changed by:  Martinique, MD   lisinopril 20 MG tablet Commonly known as: ZESTRIL Take 2 tablets (40 mg total) by mouth daily.   Niacin Flush Free 500 MG Caps Generic drug: Inositol Niacinate Take 1,000 mg by mouth daily.   nitroGLYCERIN 0.4 MG SL tablet Commonly known as: NITROSTAT Place 1 tablet (0.4 mg total) under the tongue every 5 (five) minutes as needed for chest pain.   valACYclovir 500 MG tablet Commonly known as:  VALTREX Take 1 tablet (500 mg total) by mouth daily.       Review of Systems    As noted in HPI.  All other systems reviewed and are otherwise negative except as noted above.  Physical Exam    VS:  BP (!) 156/90   Pulse 64   Temp (!) 97.3 F (36.3 C) (Temporal)   Ht 5' 8.5" (1.74 m)   Wt 178 lb (80.7 kg)   SpO2 98%   BMI 26.67 kg/m  , BMI Body mass index is 26.67 kg/m. GENERAL:  Well appearing WM in NAD HEENT:  PERRL, EOMI, sclera are clear. Oropharynx is clear. NECK:  No jugular venous distention, carotid upstroke brisk and symmetric, no bruits, no thyromegaly or adenopathy LUNGS:  Clear to auscultation bilaterally CHEST:  Unremarkable HEART:  RRR,  PMI not displaced or sustained,S1 and S2 within normal limits, no S3, no S4: no clicks, no rubs, harsh gr 3/6 systolic murmur loudest at the apex across the precordium. ABD:  Soft, nontender. BS +, no masses or bruits. No hepatomegaly, no splenomegaly EXT:  2 + pulses throughout, no edema, no cyanosis no clubbing SKIN:  Warm and dry.  No rashes NEURO:  Alert and oriented x 3. Cranial nerves II through XII intact. PSYCH:  Cognitively intact  Accessory Clinical Findings    Lab Results  Component Value Date   WBC 7.4 10/24/2018   HGB 15.9 10/24/2018   HCT 48.9 10/24/2018   PLT 186 10/24/2018   GLUCOSE 89 09/10/2019   CHOL 195 09/10/2019   TRIG 145 09/10/2019   HDL 31 (L) 09/10/2019   LDLDIRECT 159.5 10/01/2012   LDLCALC 138 (H) 09/10/2019   ALT 24 09/10/2019   AST 25 09/10/2019   NA 143 09/10/2019   K 4.7 09/10/2019   CL 105 09/10/2019   CREATININE 1.33 (H) 09/10/2019   BUN 19 09/10/2019   CO2 25 09/10/2019   TSH 4.420 02/22/2018   PSA 1.4 09/13/2010   INR 1.06 05/16/2017   HGBA1C 5.0 02/22/2018    Ecg today shows NSR rate 55, LAD, RBBB. Cannot rule out inferior infarct. No change. I have personally reviewed and interpreted this study.   Echo 07/14/19: IMPRESSIONS    1. The left ventricle has normal  systolic function with an ejection fraction of 60-65%. The cavity size was normal. Severe basal septal hypertrophy and mild concentric hypertrophy. Left ventricular diastolic Doppler parameters are consistent with  pseudonormalization. Elevated left ventricular end-diastolic pressure.  2. The average left ventricular global longitudinal strain is -16.0 %.  3. The right ventricle has normal systolic function. The cavity was normal. There is no increase in right ventricular wall thickness.  4. Left atrial size was mildly dilated.  5. The aortic valve is tricuspid. Moderate sclerosis of the aortic valve. Aortic valve regurgitation is mild by color flow Doppler.  6. The aorta is normal in size and structure.  Assessment & Plan    1.  Hypertension:  BP is high today . Probably related to missing his amlodipine. Will resume today and continue other medication.   2.  CAD: s/p prior LAD, D1, and LCX PCI with STEMI in 2013. Admitted in June 2018 with in stent restenosis of the proximal LAD. Symptoms predominantly of DOE. Treated with cutting balloon PCI. Interestedly his Myoview study prior was normal so may not be a reliable study.  Continue ASA and Coreg. He is asymptomatic.   3.  HL:   intolerant to statins and zetia. On CLEAR trial now. Last LDL 138. Will check with research. ? If we can add a PCSK9 inhibitor.   4. Hypertrophic CM:  euvolemic on exam.  Most recent Echo in August showed no significant LVOT gradient. Discussed screening of family members.       Martinique, MD,FACC 09/17/2019, 11:14 AM

## 2019-09-17 ENCOUNTER — Encounter: Payer: Self-pay | Admitting: Cardiology

## 2019-09-17 ENCOUNTER — Ambulatory Visit: Payer: Medicare HMO | Admitting: Cardiology

## 2019-09-17 ENCOUNTER — Other Ambulatory Visit: Payer: Self-pay

## 2019-09-17 VITALS — BP 156/90 | HR 64 | Temp 97.3°F | Ht 68.5 in | Wt 178.0 lb

## 2019-09-17 DIAGNOSIS — G720 Drug-induced myopathy: Secondary | ICD-10-CM

## 2019-09-17 DIAGNOSIS — Z23 Encounter for immunization: Secondary | ICD-10-CM | POA: Diagnosis not present

## 2019-09-17 DIAGNOSIS — T466X5A Adverse effect of antihyperlipidemic and antiarteriosclerotic drugs, initial encounter: Secondary | ICD-10-CM | POA: Diagnosis not present

## 2019-09-17 DIAGNOSIS — E782 Mixed hyperlipidemia: Secondary | ICD-10-CM | POA: Diagnosis not present

## 2019-09-17 DIAGNOSIS — I25118 Atherosclerotic heart disease of native coronary artery with other forms of angina pectoris: Secondary | ICD-10-CM

## 2019-09-17 DIAGNOSIS — I10 Essential (primary) hypertension: Secondary | ICD-10-CM

## 2019-09-17 DIAGNOSIS — I422 Other hypertrophic cardiomyopathy: Secondary | ICD-10-CM | POA: Diagnosis not present

## 2019-09-17 MED ORDER — CARVEDILOL 12.5 MG PO TABS: 6.2500 mg | ORAL_TABLET | Freq: Two times a day (BID) | ORAL | 3 refills | Status: DC

## 2019-09-17 MED ORDER — LISINOPRIL 20 MG PO TABS: 40.0000 mg | ORAL_TABLET | Freq: Every day | ORAL | 3 refills | Status: DC

## 2019-09-23 ENCOUNTER — Telehealth: Payer: Self-pay | Admitting: *Deleted

## 2019-09-23 DIAGNOSIS — Z006 Encounter for examination for normal comparison and control in clinical research program: Secondary | ICD-10-CM

## 2019-09-23 NOTE — Telephone Encounter (Signed)
Phone visit [id number] in the clear research study.  No aes or saes to report.  Next clinic visit scheduled.

## 2019-09-29 ENCOUNTER — Ambulatory Visit
Admission: EM | Admit: 2019-09-29 | Discharge: 2019-09-29 | Disposition: A | Discharge status: Home / Self Care | Payer: Medicare HMO | Attending: Emergency Medicine | Admitting: Emergency Medicine

## 2019-09-29 DIAGNOSIS — Z20828 Contact with and (suspected) exposure to other viral communicable diseases: Secondary | ICD-10-CM | POA: Diagnosis not present

## 2019-09-29 DIAGNOSIS — R059 Cough, unspecified: Secondary | ICD-10-CM

## 2019-09-29 DIAGNOSIS — J069 Acute upper respiratory infection, unspecified: Secondary | ICD-10-CM

## 2019-09-29 DIAGNOSIS — R05 Cough: Secondary | ICD-10-CM | POA: Diagnosis not present

## 2019-09-29 DIAGNOSIS — Z20822 Contact with and (suspected) exposure to covid-19: Secondary | ICD-10-CM

## 2019-09-29 MED ORDER — BENZONATATE 200 MG PO CAPS: 200.0000 mg | ORAL_CAPSULE | Freq: Three times a day (TID) | ORAL | 0 refills | Status: AC | PRN

## 2019-09-29 NOTE — ED Triage Notes (Signed)
Pt c/o cough since yesterday. States his wife was positive for COVID

## 2019-09-29 NOTE — Discharge Instructions (Signed)
Person Under Monitoring Name: Brandon Gonzales  Location: [place] [id number]   Infection Prevention Recommendations for Individuals Confirmed to have, or Being Evaluated for, 2019 Novel Coronavirus (COVID-19) Infection Who Receive Care at Home  Individuals who are confirmed to have, or are being evaluated for, COVID-19 should follow the prevention steps below until a healthcare provider or local or state health department says they can return to normal activities.  Stay home except to get medical care You should restrict activities outside your home, except for getting medical care. Do not go to work, school, or public areas, and do not use public transportation or taxis.  Call ahead before visiting your doctor Before your medical appointment, call the healthcare provider and tell them that you have, or are being evaluated for, COVID-19 infection. This will help the healthcare providers office take steps to keep other people from getting infected. Ask your healthcare provider to call the local or state health department.  Monitor your symptoms Seek prompt medical attention if your illness is worsening (e.g., difficulty breathing). Before going to your medical appointment, call the healthcare provider and tell them that you have, or are being evaluated for, COVID-19 infection. Ask your healthcare provider to call the local or state health department.  Wear a facemask You should wear a facemask that covers your nose and mouth when you are in the same room with other people and when you visit a healthcare provider. People who live with or visit you should also wear a facemask while they are in the same room with you.  Separate yourself from other people in your home As much as possible, you should stay in a different room from other people in your home. Also, you should use a separate bathroom, if available.  Avoid sharing household items You should not share  dishes, drinking glasses, cups, eating utensils, towels, bedding, or other items with other people in your home. After using these items, you should wash them thoroughly with soap and water.  Cover your coughs and sneezes Cover your mouth and nose with a tissue when you cough or sneeze, or you can cough or sneeze into your sleeve. Throw used tissues in a lined trash can, and immediately wash your hands with soap and water for at least 20 seconds or use an alcohol-based hand rub.  Wash your [facility] your hands often and thoroughly with soap and water for at least 20 seconds. You can use an alcohol-based hand sanitizer if soap and water are not available and if your hands are not visibly dirty. Avoid touching your eyes, nose, and mouth with unwashed hands.   Prevention Steps for Caregivers and Household Members of Individuals Confirmed to have, or Being Evaluated for, COVID-19 Infection Being Cared for in the Home  If you live with, or provide care at home for, a person confirmed to have, or being evaluated for, COVID-19 infection please follow these guidelines to prevent infection:  Follow healthcare providers instructions Make sure that you understand and can help the patient follow any healthcare provider instructions for all care.  Provide for the patients basic needs You should help the patient with basic needs in the home and provide support for getting groceries, prescriptions, and other personal needs.  Monitor the patients symptoms If they are getting sicker, call his or her medical provider and tell them that the patient has, or is being evaluated for, COVID-19 infection. This will help the healthcare providers office  take steps to keep other people from getting infected. °Ask the healthcare provider to call the local or state health department. ° °Limit the number of people who have contact with the patient °If possible, have only one caregiver for the patient. °Other  household members should stay in another home or place of residence. If this is not possible, they should stay °in another room, or be separated from the patient as much as possible. Use a separate bathroom, if available. °Restrict visitors who do not have an essential need to be in the home. ° °Keep older adults, very young children, and other sick people away from the patient °Keep older adults, very young children, and those who have compromised immune systems or chronic health conditions away from the patient. This includes people with chronic heart, lung, or kidney conditions, diabetes, and cancer. ° °Ensure good ventilation °Make sure that shared spaces in the home have good air flow, such as from an air conditioner or an opened window, °weather permitting. ° °Wash your hands often °Wash your hands often and thoroughly with soap and water for at least 20 seconds. You can use an alcohol based hand sanitizer if soap and water are not available and if your hands are not visibly dirty. °Avoid touching your eyes, nose, and mouth with unwashed hands. °Use disposable paper towels to dry your hands. If not available, use dedicated cloth towels and replace them when they become wet. ° °Wear a facemask and gloves °Wear a disposable facemask at all times in the room and gloves when you touch or have contact with the patient’s blood, body fluids, and/or secretions or excretions, such as sweat, saliva, sputum, nasal mucus, vomit, urine, or feces.  Ensure the mask fits over your nose and mouth tightly, and do not touch it during use. °Throw out disposable facemasks and gloves after using them. Do not reuse. °Wash your hands immediately after removing your facemask and gloves. °If your personal clothing becomes contaminated, carefully remove clothing and launder. Wash your hands after handling contaminated clothing. °Place all used disposable facemasks, gloves, and other waste in a lined container before disposing them with  other household waste. °Remove gloves and wash your hands immediately after handling these items. ° °Do not share dishes, glasses, or other household items with the patient °Avoid sharing household items. You should not share dishes, drinking glasses, cups, eating utensils, towels, bedding, or other items with a patient who is confirmed to have, or being evaluated for, COVID-19 infection. °After the person uses these items, you should wash them thoroughly with soap and water. ° °Wash laundry thoroughly °Immediately remove and wash clothes or bedding that have blood, body fluids, and/or secretions or excretions, such as sweat, saliva, sputum, nasal mucus, vomit, urine, or feces, on them. °Wear gloves when handling laundry from the patient. °Read and follow directions on labels of laundry or clothing items and detergent. In general, wash and dry with the warmest temperatures recommended on the label. ° °Clean all areas the individual has used often °Clean all touchable surfaces, such as counters, tabletops, doorknobs, bathroom fixtures, toilets, phones, keyboards, tablets, and bedside tables, every day. Also, clean any surfaces that may have blood, body fluids, and/or secretions or excretions on them. °Wear gloves when cleaning surfaces the patient has come in contact with. °Use a diluted bleach solution (e.g., dilute bleach with 1 part bleach and 10 parts water) or a household disinfectant with a label that says EPA-registered for coronaviruses. To make a bleach   solution at home, add 1 tablespoon of bleach to 1 quart (4 cups) of water. For a larger supply, add  cup of bleach to 1 gallon (16 cups) of water. Read labels of cleaning products and follow recommendations provided on product labels. Labels contain instructions for safe and effective use of the cleaning product including precautions you should take when applying the product, such as wearing gloves or eye protection and making sure you have good ventilation  during use of the product. Remove gloves and wash hands immediately after cleaning.  Monitor yourself for signs and symptoms of illness Caregivers and household members are considered close contacts, should monitor their health, and will be asked to limit movement outside of the home to the extent possible. Follow the monitoring steps for close contacts listed on the symptom monitoring form.   ? If you have additional questions, contact your local health department or call the epidemiologist on call at [phone] (available 24/7). ? This guidance is subject to change. For the most up-to-date guidance from [facility], please refer to their website: [email or web address]

## 2019-09-29 NOTE — ED Provider Notes (Signed)
[facility]    CSN: [id number] Arrival date & time: 09/29/19  1038      History   Chief Complaint Chief Complaint  Patient presents with  . Cough    HPI Brandon Gonzales is a 68 y.o. male history of hypertension, dyslipidemia/CAD, CKD, presenting today for evaluation of cough and Covid exposure.  Patient states that his wife recently tested positive for Covid.  He began to develop a cough over the past 24 hours.  He denies any associated congestion or sore throat.  Denies any known fevers.  Has felt slightly weak and fatigued, but otherwise normal. Denies SOB and chest pain. Denies underlying lung history.   HPI  Past Medical History:  Diagnosis Date  . Arthritis   . Cancer (HCC)    SKIN CA  SQUAMOUS CELL  . Coronary artery disease    a. 2007 Stent to LAD, PTCA D1;  b. DES to LAD & LCx 10/13 in the setting of STEMI.  [name] Kitchen Dyslipidemia    a. Statin and zetia-intolerant. On niacin.  [name] Kitchen GERD (gastroesophageal reflux disease)   . Heart murmur   . Hypertension   . Hypertrophic cardiomyopathy ([place])    a. 09/2012 Echo: EF 55%, mid to apical anterolateral, posterior, apical anterior, apical HK, Gr1 DD, turbulence across LVOT suggesting degree of obstruction, mild MR, mildly dil LA.    Patient Active Problem List   Diagnosis Date Noted  . Herpes labialis 01/31/2018  . Healthcare maintenance 01/31/2018  . Hypertension   . Heart murmur   . GERD (gastroesophageal reflux disease)   . Coronary artery disease   . Cancer ([place])   . Angina at rest [facility]) 05/17/2017  . Hypertrophic cardiomyopathy ([place])   . S/P PTCA (percutaneous transluminal coronary angioplasty)   . Chest pain 05/16/2017  . Unstable angina ([place])   . Dyspnea on exertion 02/21/2017  . Neck pain 12/28/2016  . NSVT (nonsustained ventricular tachycardia) ([place]) 09/25/2012  . Hypertrophic obstructive cardiomyopathy(425.11) 09/25/2012  . CKD (chronic kidney disease) stage 3, GFR 30-59 ml/min 09/25/2012  . ST  elevation myocardial infarction (STEMI) of lateral wall ([place]) 09/22/2012  . CAD (coronary artery disease) 01/11/2012  . HTN (hypertension) 01/11/2012  . Dyslipidemia   . H/O gastroesophageal reflux (GERD)   . Arthritis     Past Surgical History:  Procedure Laterality Date  . CARDIAC CATHETERIZATION  2007   stent to LAD and PTCA of diagonal branch  . CARDIAC CATHETERIZATION  2010  . CORONARY ANGIOPLASTY WITH STENT PLACEMENT  2013   95-99% prox LAD, patent LAD stent distal to this, occluded mid-dist LCx, mild <10% RCA irreg; s/p DES-prox LAD & DES to mid-dist LCx  . CORONARY BALLOON ANGIOPLASTY N/A 05/16/2017   Procedure: Coronary Balloon Angioplasty;  Surgeon: Leonie Man, MD;  Location: [place] [facility];  Service: Cardiovascular;  Laterality: N/A;  . [place]   right  . [place]   left  . LEFT HEART CATH Bilateral 09/22/2012   Procedure: LEFT HEART CATH;  Surgeon: Peter M Martinique, MD;  Location[facility] C[facility] LAB;  Service: Cardiovascular;  Laterality: Bilateral;  . LEFT HEART CATH AND CORONARY ANGIOGRAPHY N/A 05/16/2017   Procedure: Left Heart Cath and Coronary Angiography;  Surgeon: Leonie Man, MD;  Location: [place] [facility];  Service: Cardiovascular;  Laterality: N/A;  . PERCUTANEOUS CORONARY STENT INTERVENTION (PCI-S) Right 09/22/2012   Procedure: PERCUTANEOUS CORONARY STENT INTERVENTION (PCI-S);  Surgeon: Peter M Martinique, MD;  Location: [place] CATH LAB;  Service: Cardiovascular;  Laterality: Right;       Home Medications    Prior to Admission medications   Medication Sig Start Date End Date Taking? Authorizing Provider  AMBULATORY NON FORMULARY MEDICATION Take 180 mg by mouth daily. Medication Name: bempedoic acid vs a placebo CLEAR Research Study drug provided 05/02/17   Lelon Perla, MD  amLODipine (NORVASC) 2.5 MG tablet TAKE 1 TABLET BY MOUTH EVERY DAY 06/09/19   Martinique, Peter M, MD  aspirin EC 81 MG tablet Take 81 mg by mouth daily.     [provider]  benzonatate (TESSALON) 200 MG capsule Take 1 capsule (200 mg total) by mouth 3 (three) times daily as needed for up to 7 days for cough. 09/29/19 10/06/19  Alonie Gazzola C, PA-C  calcium carbonate (TUMS EX) 750 MG chewable tablet Chew 2 tablets by mouth daily as needed for heartburn.    [provider]  carvedilol (COREG) 12.5 MG tablet Take 0.5 tablets (6.25 mg total) by mouth 2 (two) times daily. 09/17/19   Martinique, Peter M, MD  Inositol Niacinate (NIACIN FLUSH FREE) 500 MG CAPS Take 1,000 mg by mouth daily.    [provider]  lisinopril (ZESTRIL) 20 MG tablet Take 2 tablets (40 mg total) by mouth daily. 09/17/19   Martinique, Peter M, MD  nitroGLYCERIN (NITROSTAT) 0.4 MG SL tablet Place 1 tablet (0.4 mg total) under the tongue every 5 (five) minutes as needed for chest pain. 01/13/19 04/13/19  Martinique, Peter M, MD  valACYclovir (VALTREX) 500 MG tablet Take 1 tablet (500 mg total) by mouth daily. 08/30/19   Hall-Potvin, [place], PA-C    Family History Family History  Problem Relation Age of Onset  . Heart attack Mother 46  . Heart disease Mother     Social History Social History   Tobacco Use  . Smoking status: Never Smoker  . Smokeless tobacco: Never Used  Substance Use Topics  . Alcohol use: No  . Drug use: No     Allergies   Antihistamines, chlorpheniramine-type; Pheniramine; Statins; and Zetia [ezetimibe]   Review of Systems Review of Systems  Constitutional: Positive for fatigue. Negative for activity change, appetite change, chills and fever.  HENT: Negative for congestion, ear pain, rhinorrhea, sinus pressure, sore throat and trouble swallowing.   Eyes: Negative for discharge and redness.  Respiratory: Positive for cough. Negative for chest tightness and shortness of breath.   Cardiovascular: Negative for chest pain.  Gastrointestinal: Negative for abdominal pain, diarrhea, nausea and vomiting.  Musculoskeletal: Negative for  myalgias.  Skin: Negative for rash.  Neurological: Negative for dizziness, light-headedness and headaches.     Physical Exam Triage Vital Signs ED Triage Vitals [09/29/19 1113]  Enc Vitals Group     BP (!) 170/97     Pulse Rate 65     Resp 18     Temp 98 F (36.7 C)     Temp Source Oral     SpO2 97 %     Weight      Height      Head Circumference      Peak Flow      Pain Score      Pain Loc      Pain Edu?      Excl. in [place]?    No data found.  Updated Vital Signs BP (!) 170/97 (BP Location: Left Arm)   Pulse 65   Temp 98 F (36.7 C) (Oral)  Resp 18   SpO2 97%   Visual Acuity Right Eye Distance:   Left Eye Distance:   Bilateral Distance:    Right Eye Near:   Left Eye Near:    Bilateral Near:     Physical Exam Vitals signs and nursing note reviewed.  Constitutional:      Appearance: He is well-developed.  HENT:     Head: Normocephalic and atraumatic.     Ears:     Comments: Bilateral ears without tenderness to palpation of external auricle, tragus and mastoid, EAC's without erythema or swelling, TM's with good bony landmarks and cone of light. Non erythematous.     Mouth/Throat:     Comments: Oral mucosa pink and moist, no tonsillar enlargement or exudate. Posterior pharynx patent and nonerythematous, no uvula deviation or swelling. Normal phonation.  Eyes:     Conjunctiva/sclera: Conjunctivae normal.  Neck:     Musculoskeletal: Neck supple.  Cardiovascular:     Rate and Rhythm: Normal rate and regular rhythm.     Heart sounds: No murmur.  Pulmonary:     Effort: Pulmonary effort is normal. No respiratory distress.     Breath sounds: Normal breath sounds.     Comments: Breathing comfortably at rest, CTABL, no wheezing, rales or other adventitious sounds auscultated Abdominal:     Palpations: Abdomen is soft.     Tenderness: There is no abdominal tenderness.  Skin:    General: Skin is warm and dry.  Neurological:     Mental Status: He is alert.       UC Treatments / Results  Labs (all labs ordered are listed, but only abnormal results are displayed) Labs Reviewed  NOVEL CORONAVIRUS, NAA    EKG   Radiology No results found.  Procedures Procedures (including critical care time)  Medications Ordered in UC Medications - No data to display  Initial Impression / Assessment and Plan / UC Course  I have reviewed the triage vital signs and the nursing notes.  Pertinent labs & imaging results that were available during my care of the patient were reviewed by me and considered in my medical decision making (see chart for details).     Cough x1 day, close exposure to Covid at home, likely Covid positive as well.  Covid swab obtained.  Discussed symptomatic and supportive care.  Blood pressure elevated today, patient states it has been elevated recently, primary care for follow up of this.   Discussed strict return precautions. Patient verbalized understanding and is agreeable with plan.  Final Clinical Impressions(s) / UC Diagnoses   Final diagnoses:  Cough  Viral URI with cough  Exposure to COVID-19 virus     Discharge Instructions        Person Under Monitoring Name: Brandon Gonzales  Location: [place] [address]   Infection Prevention Recommendations for Individuals Confirmed to have, or Being Evaluated for, 2019 Novel Coronavirus (COVID-19) Infection Who Receive Care at Home  Individuals who are confirmed to have, or are being evaluated for, COVID-19 should follow the prevention steps below until a healthcare provider or local or state health department says they can return to normal activities.  Stay home except to get medical care You should restrict activities outside your home, except for getting medical care. Do not go to work, school, or public areas, and do not use public transportation or taxis.  Call ahead before visiting your doctor Before your medical appointment, call the  healthcare provider and tell them  that you have, or are being evaluated for, COVID-19 infection. This will help the healthcare provider's office take steps to keep other people from getting infected. Ask your healthcare provider to call the local or state health department.  Monitor your symptoms Seek prompt medical attention if your illness is worsening (e.g., difficulty breathing). Before going to your medical appointment, call the healthcare provider and tell them that you have, or are being evaluated for, COVID-19 infection. Ask your healthcare provider to call the local or state health department.  Wear a facemask You should wear a facemask that covers your nose and mouth when you are in the same room with other people and when you visit a healthcare provider. People who live with or visit you should also wear a facemask while they are in the same room with you.  Separate yourself from other people in your home As much as possible, you should stay in a different room from other people in your home. Also, you should use a separate bathroom, if available.  Avoid sharing household items You should not share dishes, drinking glasses, cups, eating utensils, towels, bedding, or other items with other people in your home. After using these items, you should wash them thoroughly with soap and water.  Cover your coughs and sneezes Cover your mouth and nose with a tissue when you cough or sneeze, or you can cough or sneeze into your sleeve. Throw used tissues in a lined trash can, and immediately wash your hands with soap and water for at least 20 seconds or use an alcohol-based hand rub.  Wash your [facility] your hands often and thoroughly with soap and water for at least 20 seconds. You can use an alcohol-based hand sanitizer if soap and water are not available and if your hands are not visibly dirty. Avoid touching your eyes, nose, and mouth with unwashed hands.   Prevention Steps for  Caregivers and Household Members of Individuals Confirmed to have, or Being Evaluated for, COVID-19 Infection Being Cared for in the Home  If you live with, or provide care at home for, a person confirmed to have, or being evaluated for, COVID-19 infection please follow these guidelines to prevent infection:  Follow healthcare provider's instructions Make sure that you understand and can help the patient follow any healthcare provider instructions for all care.  Provide for the patient's basic needs You should help the patient with basic needs in the home and provide support for getting groceries, prescriptions, and other personal needs.  Monitor the patient's symptoms If they are getting sicker, call his or her medical provider and tell them that the patient has, or is being evaluated for, COVID-19 infection. This will help the healthcare provider's office take steps to keep other people from getting infected. Ask the healthcare provider to call the local or state health department.  Limit the number of people who have contact with the patient  If possible, have only one caregiver for the patient.  Other household members should stay in another home or place of residence. If this is not possible, they should stay  in another room, or be separated from the patient as much as possible. Use a separate bathroom, if available.  Restrict visitors who do not have an essential need to be in the home.  Keep older adults, very young children, and other sick people away from the patient Keep older adults, very young children, and those who have compromised immune systems or chronic health conditions away  from the patient. This includes people with chronic heart, lung, or kidney conditions, diabetes, and cancer.  Ensure good ventilation Make sure that shared spaces in the home have good air flow, such as from an air conditioner or an opened window, weather permitting.  Wash your hands often   Wash your hands often and thoroughly with soap and water for at least 20 seconds. You can use an alcohol based hand sanitizer if soap and water are not available and if your hands are not visibly dirty.  Avoid touching your eyes, nose, and mouth with unwashed hands.  Use disposable paper towels to dry your hands. If not available, use dedicated cloth towels and replace them when they become wet.  Wear a facemask and gloves  Wear a disposable facemask at all times in the room and gloves when you touch or have contact with the patient's blood, body fluids, and/or secretions or excretions, such as sweat, saliva, sputum, nasal mucus, vomit, urine, or feces.  Ensure the mask fits over your nose and mouth tightly, and do not touch it during use.  Throw out disposable facemasks and gloves after using them. Do not reuse.  Wash your hands immediately after removing your facemask and gloves.  If your personal clothing becomes contaminated, carefully remove clothing and launder. Wash your hands after handling contaminated clothing.  Place all used disposable facemasks, gloves, and other waste in a lined container before disposing them with other household waste.  Remove gloves and wash your hands immediately after handling these items.  Do not share dishes, glasses, or other household items with the patient  Avoid sharing household items. You should not share dishes, drinking glasses, cups, eating utensils, towels, bedding, or other items with a patient who is confirmed to have, or being evaluated for, COVID-19 infection.  After the person uses these items, you should wash them thoroughly with soap and water.  Wash laundry thoroughly  Immediately remove and wash clothes or bedding that have blood, body fluids, and/or secretions or excretions, such as sweat, saliva, sputum, nasal mucus, vomit, urine, or feces, on them.  Wear gloves when handling laundry from the patient.  Read and follow  directions on labels of laundry or clothing items and detergent. In general, wash and dry with the warmest temperatures recommended on the label.  Clean all areas the individual has used often  Clean all touchable surfaces, such as counters, tabletops, doorknobs, bathroom fixtures, toilets, phones, keyboards, tablets, and bedside tables, every day. Also, clean any surfaces that may have blood, body fluids, and/or secretions or excretions on them.  Wear gloves when cleaning surfaces the patient has come in contact with.  Use a diluted bleach solution (e.g., dilute bleach with 1 part bleach and 10 parts water) or a household disinfectant with a label that says EPA-registered for coronaviruses. To make a bleach solution at home, add 1 tablespoon of bleach to 1 quart (4 cups) of water. For a larger supply, add  cup of bleach to 1 gallon (16 cups) of water.  Read labels of cleaning products and follow recommendations provided on product labels. Labels contain instructions for safe and effective use of the cleaning product including precautions you should take when applying the product, such as wearing gloves or eye protection and making sure you have good ventilation during use of the product.  Remove gloves and wash hands immediately after cleaning.  Monitor yourself for signs and symptoms of illness Caregivers and household members are considered close contacts, should  monitor their health, and will be asked to limit movement outside of the home to the extent possible. Follow the monitoring steps for close contacts listed on the symptom monitoring form.   ? If you have additional questions, contact your local health department or call the epidemiologist on call at [phone] (available 24/7). ? This guidance is subject to change. For the most up-to-date guidance from [facility], please refer to their website: YouBlogs.pl    ED  Prescriptions    Medication Sig Dispense Auth. Provider   benzonatate (TESSALON) 200 MG capsule Take 1 capsule (200 mg total) by mouth 3 (three) times daily as needed for up to 7 days for cough. 28 capsule Dexton Zwilling, [place] C, PA-C     PDMP not reviewed this encounter.   Dewan Emond, [place] C, PA-C 09/29/19 1350

## 2019-10-01 LAB — NOVEL CORONAVIRUS, NAA: SARS-CoV-2, NAA: DETECTED — AB

## 2019-10-02 ENCOUNTER — Telehealth (HOSPITAL_COMMUNITY): Payer: Self-pay | Admitting: Emergency Medicine

## 2019-10-02 NOTE — Telephone Encounter (Signed)
Positive covid, contacted patient to make him aware, he had seen his results in mychart. Pt had no questions, gave him my number if he needs anything from [place].

## 2019-11-04 ENCOUNTER — Telehealth: Payer: Self-pay | Admitting: Adult Health

## 2019-11-04 NOTE — Progress Notes (Signed)
Virtual Visit via Telephone Note  I connected with Brandon Gonzales on 11/05/2019 at  3:15 PM EST by telephone and verified that I am speaking with the correct person using two identifiers.  Location: Patient:Home Provider:In Clinic   I discussed the limitations, risks, security and privacy concerns of performing an evaluation and management service by telephone and the availability of in person appointments. I also discussed with the patient that there may be a patient responsible charge related to this service. The patient expressed understanding and agreed to proceed.   History of Present Illness: 11/05/2019 OV: Mr. Dubow calls in for refill for Valtrex ED Visit 09/02/2019 for Herpes Labialis outbreak Treated with Valtrex 500mg  BID x 3 days, then resume suppressive therapy of 500mg  QD 09/10/2019 Hepatic Fx Panel-normal CMP- GFR 54 He has not been seen in clinic since 02/2018- was instructed to f/u 08/2018    09/29/2019 SARS-CoV-2- Dectected-Quarantine completed 10/09/2019 Only lingering sx is fatigue.  He was seen by Cardiology 09/17/2019- no changes to medication. Most recent Echo in August showed no significant LVOT gradient. F/u 03/17/2020  Patient Care Team    Relationship Specialty Notifications Start End  Mina Marble D, NP PCP - General Family Medicine  12/14/16   Martinique, Peter M, MD PCP - Cardiology Cardiology Admissions 12/14/17   Jarome Matin, MD Consulting Physician Dermatology  12/14/16   Melida Quitter, Enoch Physician Otolaryngology  12/14/16     Patient Active Problem List   Diagnosis Date Noted  . Herpes labialis 01/31/2018  . Healthcare maintenance 01/31/2018  . Hypertension   . Heart murmur   . GERD (gastroesophageal reflux disease)   . Coronary artery disease   . Cancer ([place])   . Angina at rest [facility]) 05/17/2017  . Hypertrophic cardiomyopathy ([place])   . S/P PTCA (percutaneous transluminal coronary angioplasty)   . Chest pain 05/16/2017  . Unstable angina  ([place])   . Dyspnea on exertion 02/21/2017  . Neck pain 12/28/2016  . NSVT (nonsustained ventricular tachycardia) ([place]) 09/25/2012  . Hypertrophic obstructive cardiomyopathy(425.11) 09/25/2012  . CKD (chronic kidney disease) stage 3, GFR 30-59 ml/min 09/25/2012  . ST elevation myocardial infarction (STEMI) of lateral wall ([place]) 09/22/2012  . CAD (coronary artery disease) 01/11/2012  . HTN (hypertension) 01/11/2012  . Dyslipidemia   . H/O gastroesophageal reflux (GERD)   . Arthritis      Past Medical History:  Diagnosis Date  . Arthritis   . Cancer ([facility])    SKIN CA  SQUAMOUS CELL  . Coronary artery disease    a. 2007 Stent to LAD, PTCA D1;  b. DES to LAD & LCx 10/13 in the setting of STEMI.  [name] Kitchen Dyslipidemia    a. Statin and zetia-intolerant. On niacin.  [name] Kitchen GERD (gastroesophageal reflux disease)   . Heart murmur   . Hypertension   . Hypertrophic cardiomyopathy ([place])    a. 09/2012 Echo: EF 55%, mid to apical anterolateral, posterior, apical anterior, apical HK, Gr1 DD, turbulence across LVOT suggesting degree of obstruction, mild MR, mildly dil LA.     Past Surgical History:  Procedure Laterality Date  . CARDIAC CATHETERIZATION  2007   stent to LAD and PTCA of diagonal branch  . CARDIAC CATHETERIZATION  2010  . CORONARY ANGIOPLASTY WITH STENT PLACEMENT  2013   95-99% prox LAD, patent LAD stent distal to this, occluded mid-dist LCx, mild <10% RCA irreg; s/p DES-prox LAD & DES to mid-dist LCx  . CORONARY BALLOON ANGIOPLASTY N/A 05/16/2017   Procedure: Coronary  Balloon Angioplasty;  Surgeon: Leonie Man, MD;  Location: [place] CV LAB;  Service: Cardiovascular;  Laterality: N/A;  . Bernie   right  . Bernie   left  . LEFT HEART CATH Bilateral 09/22/2012   Procedure: LEFT HEART CATH;  Surgeon: Peter M Martinique, MD;  Location: [facility] CATH LAB;  Service: Cardiovascular;  Laterality: Bilateral;  . LEFT HEART CATH AND CORONARY ANGIOGRAPHY N/A 05/16/2017    Procedure: Left Heart Cath and Coronary Angiography;  Surgeon: Leonie Man, MD;  Location: [place] CV LAB;  Service: Cardiovascular;  Laterality: N/A;  . PERCUTANEOUS CORONARY STENT INTERVENTION (PCI-S) Right 09/22/2012   Procedure: PERCUTANEOUS CORONARY STENT INTERVENTION (PCI-S);  Surgeon: Peter M Martinique, MD;  Location: [facility] CATH LAB;  Service: Cardiovascular;  Laterality: Right;     Family History  Problem Relation Age of Onset  . Heart attack Mother 45  . Heart disease Mother      Social History   Substance and Sexual Activity  Drug Use No     Social History   Substance and Sexual Activity  Alcohol Use No     Social History   Tobacco Use  Smoking Status Never Smoker  Smokeless Tobacco Never Used     Outpatient Encounter Medications as of 11/05/2019  Medication Sig  . AMBULATORY NON FORMULARY MEDICATION Take 180 mg by mouth daily. Medication Name: bempedoic acid vs a placebo CLEAR Research Study drug provided  . amLODipine (NORVASC) 2.5 MG tablet TAKE 1 TABLET BY MOUTH EVERY DAY  . aspirin EC 81 MG tablet Take 81 mg by mouth daily.  . calcium carbonate (TUMS EX) 750 MG chewable tablet Chew 2 tablets by mouth daily as needed for heartburn.  . carvedilol (COREG) 12.5 MG tablet Take 0.5 tablets (6.25 mg total) by mouth 2 (two) times daily.  . Inositol Niacinate (NIACIN FLUSH FREE) 500 MG CAPS Take 1,000 mg by mouth daily.  [name] Kitchen lisinopril (ZESTRIL) 20 MG tablet Take 2 tablets (40 mg total) by mouth daily.  . nitroGLYCERIN (NITROSTAT) 0.4 MG SL tablet Place 1 tablet (0.4 mg total) under the tongue every 5 (five) minutes as needed for chest pain.  . valACYclovir (VALTREX) 500 MG tablet Take 1 tablet (500 mg total) by mouth daily.   No facility-administered encounter medications on file as of 11/05/2019.     Allergies: Antihistamines, chlorpheniramine-type; Pheniramine; Statins; and Zetia [ezetimibe]  There is no height or weight on file to calculate BMI.  There  were no vitals taken for this visit. Review of Systems: General:   Denies fever, chills, unexplained weight loss.  Optho/Auditory:   Denies visual changes, blurred vision/LOV Respiratory:   Denies SOB, DOE more than baseline levels.  Cardiovascular:   Denies chest pain, palpitations, new onset peripheral edema  Gastrointestinal:   Denies nausea, vomiting, diarrhea.  Genitourinary: Denies dysuria, freq/ urgency, flank pain or discharge from genitals.  Endocrine:     Denies hot or cold intolerance, polyuria, polydipsia. Musculoskeletal:   Denies unexplained myalgias, joint swelling, unexplained arthralgias, gait problems.  Skin:  Denies rash, suspicious lesions Neurological:     Denies dizziness, unexplained weakness, numbness  Psychiatric/Behavioral:   Denies mood changes, suicidal or homicidal ideations, hallucinations  Observations/Objective: No acute distress noted during the telephone conversation.  Assessment and Plan: 09/10/2019 Hepatic Fx Panel-normal CMP- GFR 54 Valtrex 500mg  QD, 90 count with one refill. Heart healthy diet. Continue with Cardiology as directed. Continue to social distance and wear a mask  when in public.  Follow Up Instructions: CPE 3 months   I discussed the assessment and treatment plan with the patient. The patient was provided an opportunity to ask questions and all were answered. The patient agreed with the plan and demonstrated an understanding of the instructions.   The patient was advised to call back or seek an in-person evaluation if the symptoms worsen or if the condition fails to improve as anticipated.  I provided 12 minutes of non-face-to-face time during this encounter.   [name], NP

## 2019-11-05 ENCOUNTER — Encounter: Payer: Self-pay | Admitting: Adult Health

## 2019-11-05 ENCOUNTER — Other Ambulatory Visit: Payer: Self-pay

## 2019-11-05 ENCOUNTER — Telehealth: Payer: Self-pay | Admitting: Adult Health

## 2019-11-05 ENCOUNTER — Ambulatory Visit (INDEPENDENT_AMBULATORY_CARE_PROVIDER_SITE_OTHER): Payer: Medicare HMO | Admitting: Adult Health

## 2019-11-05 DIAGNOSIS — E785 Hyperlipidemia, unspecified: Secondary | ICD-10-CM

## 2019-11-05 DIAGNOSIS — N183 Chronic kidney disease, stage 3 unspecified: Secondary | ICD-10-CM

## 2019-11-05 DIAGNOSIS — Z0000 Encounter for general adult medical examination without abnormal findings: Secondary | ICD-10-CM

## 2019-11-05 DIAGNOSIS — B001 Herpesviral vesicular dermatitis: Secondary | ICD-10-CM | POA: Diagnosis not present

## 2019-11-05 DIAGNOSIS — I10 Essential (primary) hypertension: Secondary | ICD-10-CM

## 2019-11-05 MED ORDER — VALACYCLOVIR HCL 500 MG PO TABS: 500.0000 mg | ORAL_TABLET | Freq: Every day | ORAL | 1 refills | Status: DC

## 2019-11-05 NOTE — Telephone Encounter (Signed)
Please call pt to schedule lab appt prior to CPE.  Orders entered.  Charyl Bigger, CMA

## 2019-11-05 NOTE — Telephone Encounter (Signed)
Forwarding note to medical asst that pt's 12/2 AVS states CPE in 3 month, noticed there are no lab orders-- Do we need to schedule a LAB appt ??  Please advise.  --glh

## 2019-11-05 NOTE — Assessment & Plan Note (Signed)
Assessment and Plan: 09/10/2019 Hepatic Fx Panel-normal CMP- GFR 54 Valtrex 500mg  QD, 90 count with one refill. Heart healthy diet. Continue with Cardiology as directed. Continue to social distance and wear a mask when in public.  Follow Up Instructions: CPE 3 months   I discussed the assessment and treatment plan with the patient. The patient was provided an opportunity to ask questions and all were answered. The patient agreed with the plan and demonstrated an understanding of the instructions.   The patient was advised to call back or seek an in-person evaluation if the symptoms worsen or if the condition fails to improve as anticipated.

## 2019-11-13 DIAGNOSIS — G720 Drug-induced myopathy: Secondary | ICD-10-CM | POA: Insufficient documentation

## 2019-11-13 DIAGNOSIS — T466X5A Adverse effect of antihyperlipidemic and antiarteriosclerotic drugs, initial encounter: Secondary | ICD-10-CM | POA: Insufficient documentation

## 2019-11-17 ENCOUNTER — Encounter: Payer: Medicare HMO | Admitting: *Deleted

## 2019-11-17 ENCOUNTER — Other Ambulatory Visit: Payer: Self-pay

## 2019-11-17 VITALS — BP 147/85 | HR 60 | Wt 175.6 lb

## 2019-11-17 DIAGNOSIS — Z006 Encounter for examination for normal comparison and control in clinical research program: Secondary | ICD-10-CM

## 2019-11-17 NOTE — Research (Signed)
Subject to research clinic for visit T12M30 in the Clear Research study.  subject was in the ER for COVID-19 on 29-Sep-2019 and started taking Benzonatate 200 mg TID, PRN. Subject was 100% compliant with medications and new drug dispensed.  Next follow up phone and clinic visit scheduled. Re-consented to [place] version 7.0 1Mar2020, local 15Apr2020.   Clear Informed consent   Subject Name: Brandon Gonzales  Subject met inclusion and exclusion criteria.  The informed consent form, study requirements and expectations were reviewed with the subject and questions and concerns were addressed prior to the signing of the consent form.  The subject verbalized understanding of the trial requirements.  The subject agreed to participate in the Clear trial and signed the informed consent at [facility] on12/14/20.  The informed consent was obtained prior to performance of any protocol-specific procedures for the subject.  A copy of the signed informed consent was given to the subject and a copy was placed in the subject's medical record.   [name]

## 2019-11-18 ENCOUNTER — Other Ambulatory Visit: Payer: Self-pay | Admitting: Cardiology

## 2019-11-18 DIAGNOSIS — Z85828 Personal history of other malignant neoplasm of skin: Secondary | ICD-10-CM | POA: Diagnosis not present

## 2019-11-18 DIAGNOSIS — L570 Actinic keratosis: Secondary | ICD-10-CM | POA: Diagnosis not present

## 2019-11-18 DIAGNOSIS — D485 Neoplasm of uncertain behavior of skin: Secondary | ICD-10-CM | POA: Diagnosis not present

## 2019-11-18 DIAGNOSIS — L821 Other seborrheic keratosis: Secondary | ICD-10-CM | POA: Diagnosis not present

## 2019-11-18 DIAGNOSIS — Z85820 Personal history of malignant melanoma of skin: Secondary | ICD-10-CM | POA: Diagnosis not present

## 2019-11-18 DIAGNOSIS — L430 Hypertrophic lichen planus: Secondary | ICD-10-CM | POA: Diagnosis not present

## 2019-11-18 DIAGNOSIS — L812 Freckles: Secondary | ICD-10-CM | POA: Diagnosis not present

## 2019-11-18 DIAGNOSIS — L820 Inflamed seborrheic keratosis: Secondary | ICD-10-CM | POA: Diagnosis not present

## 2019-12-12 ENCOUNTER — Telehealth: Payer: Self-pay | Admitting: Cardiology

## 2019-12-12 NOTE — Telephone Encounter (Signed)
I think it is fine to wait and be seen by Lurena Joiner next week.  Keelyn Fjelstad Martinique MD, [facility]

## 2019-12-12 NOTE — Telephone Encounter (Signed)
Spoke to patient Dr.Jordan's advice given.Advised to go to ED if symptoms become worse.

## 2019-12-12 NOTE — Telephone Encounter (Signed)
Patient c/o Palpitations:  High priority if patient c/o lightheadedness, shortness of breath, or chest pain  1) How long have you had palpitations/irregular HR/ Afib? Are you having the symptoms now? About a week, Yes  2) Are you currently experiencing lightheadedness, SOB or CP? Chest soreness  3) Do you have a history of afib (atrial fibrillation) or irregular heart rhythm? Yes  4) Have you checked your BP or HR? (document readings if available): No  5) Are you experiencing any other symptoms? Numbness in left arm and hand

## 2019-12-12 NOTE — Telephone Encounter (Signed)
Spoke with pt and complaining of chest soreness for about a week and on occasion notes lightheadedness and low HR  Also notes significant chest discomfort when laying down and if does anything strenuous B/P 158/96 HR 66 from today Per pt had different pain prior to having heart cath in 2018 Discussed with Dr Gwenlyn Found and he suggests pt to go to ED Pt declines this at this time as pt feels this is different than when had previous heart cath (2018) Appt made for Tuesday with Kerin Ransom PA for 9:30 am Will forward to Dr Martinique for review and recommendations./cy

## 2019-12-16 ENCOUNTER — Ambulatory Visit: Payer: Medicare HMO | Admitting: Cardiology

## 2019-12-17 ENCOUNTER — Ambulatory Visit: Payer: Medicare HMO | Admitting: Cardiology

## 2019-12-17 ENCOUNTER — Other Ambulatory Visit: Payer: Self-pay

## 2019-12-17 ENCOUNTER — Encounter: Payer: Self-pay | Admitting: *Deleted

## 2019-12-17 VITALS — BP 170/92 | HR 56 | Temp 97.3°F | Ht 68.5 in | Wt 174.6 lb

## 2019-12-17 DIAGNOSIS — I422 Other hypertrophic cardiomyopathy: Secondary | ICD-10-CM | POA: Diagnosis not present

## 2019-12-17 DIAGNOSIS — I2510 Atherosclerotic heart disease of native coronary artery without angina pectoris: Secondary | ICD-10-CM | POA: Diagnosis not present

## 2019-12-17 DIAGNOSIS — E785 Hyperlipidemia, unspecified: Secondary | ICD-10-CM | POA: Diagnosis not present

## 2019-12-17 DIAGNOSIS — Z9861 Coronary angioplasty status: Secondary | ICD-10-CM | POA: Diagnosis not present

## 2019-12-17 DIAGNOSIS — R55 Syncope and collapse: Secondary | ICD-10-CM | POA: Diagnosis not present

## 2019-12-17 DIAGNOSIS — I10 Essential (primary) hypertension: Secondary | ICD-10-CM | POA: Diagnosis not present

## 2019-12-17 NOTE — Assessment & Plan Note (Signed)
a. 2007 Stent to LAD, PTCA D1;   b. DES to LAD & LCx 10/13 in the setting of STEMI. C. HSRA to LAD for ISR June 2019

## 2019-12-17 NOTE — Progress Notes (Signed)
Cardiology Office Note:    Date:  12/17/2019   ID:  Brandon Gonzales, DOB 1951/01/18, MRN [id number]  PCP:  Brandon Grandchild, NP  Cardiologist:  Peter Martinique, MD  Electrophysiologist:  None   Referring MD: Brandon Grandchild, NP   CC: near syncope  History of Present Illness:    Brandon Gonzales is a 69 y.o. male with a hx of coronary disease.  He had an LAD PCI with DES and a first diagonal p.o. BA in 2007 in the setting of an MI.  He had another MI and 2013 and had a mid circumflex and proximal LAD DES placed.  His last intervention was in June 2019 when he had high-speed rotational atherectomy for LAD in-stent restenosis.  He is done well from a cardiac standpoint since then.  He does have a history of hypertrophic cardiomyopathy, his last echocardiogram in August 2020 showed no LV outflow tract gradient.  Other medical issues include hypertension and dyslipidemia.  He was enrolled in the "Clear Study" trial Dec 2020.  Patient is in the office today after an episode of near syncope a couple weeks ago.  The patient tells me he was with his family on New Year's Day.  They had just eaten and he was in his car with his daughter who is a paramedic driving a plate of food over to his brother's house.  While driving he became weak and near syncopal.  Denies any diaphoresis, shortness of breath, or nausea and vomiting.  His daughter took his pulse and thought he might be in atrial fibrillation but if it was present it did not last long.  Symptoms resolved quickly.  He did have a recurrent episode the following 2 days but has not had one since.  He otherwise denies any unusual chest pain or unusual dyspnea.  He did have COVID back in October and says he has not completely recovered from that, he still feels generally fatigued.  Past Medical History:  Diagnosis Date  . Arthritis   . Cancer (HCC)    SKIN CA  SQUAMOUS CELL  . Coronary artery disease    a. 2007 Stent to LAD, PTCA D1;  b. DES to LAD & LCx  10/13 in the setting of STEMI.  [name] Kitchen Dyslipidemia    a. Statin and zetia-intolerant. On niacin.  [name] Kitchen GERD (gastroesophageal reflux disease)   . Heart murmur   . Hypertension   . Hypertrophic cardiomyopathy ([place])    a. 09/2012 Echo: EF 55%, mid to apical anterolateral, posterior, apical anterior, apical HK, Gr1 DD, turbulence across LVOT suggesting degree of obstruction, mild MR, mildly dil LA.    Past Surgical History:  Procedure Laterality Date  . CARDIAC CATHETERIZATION  2007   stent to LAD and PTCA of diagonal branch  . CARDIAC CATHETERIZATION  2010  . CORONARY ANGIOPLASTY WITH STENT PLACEMENT  2013   95-99% prox LAD, patent LAD stent distal to this, occluded mid-dist LCx, mild <10% RCA irreg; s/p DES-prox LAD & DES to mid-dist LCx  . CORONARY BALLOON ANGIOPLASTY N/A 05/16/2017   Procedure: Coronary Balloon Angioplasty;  Surgeon: Leonie Man, MD;  Location: [facility];  Service: Cardiovascular;  Laterality: N/A;  . [place]   right  . [place]   left  . LEFT HEART CATH Bilateral 09/22/2012   Procedure: LEFT HEART CATH;  Surgeon: Peter M Martinique, MD;  Location: [facility] CATH LAB;  Service: Cardiovascular;  Laterality: Bilateral;  .  LEFT HEART CATH AND CORONARY ANGIOGRAPHY N/A 05/16/2017   Procedure: Left Heart Cath and Coronary Angiography;  Surgeon: Leonie Man, MD;  Location: [place] [facility];  Service: Cardiovascular;  Laterality: N/A;  . PERCUTANEOUS CORONARY STENT INTERVENTION (PCI-S) Right 09/22/2012   Procedure: PERCUTANEOUS CORONARY STENT INTERVENTION (PCI-S);  Surgeon: Peter M Martinique, MD;  Location: [facility] CATH LAB;  Service: Cardiovascular;  Laterality: Right;    Current Medications: Current Meds  Medication Sig  . AMBULATORY NON FORMULARY MEDICATION Take 180 mg by mouth daily. Medication Name: bempedoic acid vs a placebo CLEAR Research Study drug provided  . amLODipine (NORVASC) 2.5 MG tablet TAKE 1 TABLET BY MOUTH EVERY DAY  . aspirin EC 81 MG  tablet Take 81 mg by mouth daily.  . calcium carbonate (TUMS EX) 750 MG chewable tablet Chew 2 tablets by mouth daily as needed for heartburn.  . carvedilol (COREG) 12.5 MG tablet Take 0.5 tablets (6.25 mg total) by mouth 2 (two) times daily.  . Inositol Niacinate (NIACIN FLUSH FREE) 500 MG CAPS Take 1,000 mg by mouth daily.  [name] Kitchen lisinopril (ZESTRIL) 20 MG tablet Take 2 tablets (40 mg total) by mouth daily.  . valACYclovir (VALTREX) 500 MG tablet Take 1 tablet (500 mg total) by mouth daily.     Allergies:   Antihistamines, chlorpheniramine-type; Pheniramine; Statins; and Zetia [ezetimibe]   Social History   Socioeconomic History  . Marital status: Married    Spouse name: Not on file  . Number of children: Not on file  . Years of education: Not on file  . Highest education level: Not on file  Occupational History  . Not on file  Tobacco Use  . Smoking status: Never Smoker  . Smokeless tobacco: Never Used  Substance and Sexual Activity  . Alcohol use: No  . Drug use: No  . Sexual activity: Not Currently  Other Topics Concern  . Not on file  Social History Narrative  . Not on file   Social Determinants of Health   Financial Resource Strain:   . Difficulty of Paying Living Expenses: Not on file  Food Insecurity:   . Worried About Charity fundraiser in the Last Year: Not on file  . Ran Out of Food in the Last Year: Not on file  Transportation Needs:   . Lack of Transportation (Medical): Not on file  . Lack of Transportation (Non-Medical): Not on file  Physical Activity:   . Days of Exercise per Week: Not on file  . Minutes of Exercise per Session: Not on file  Stress:   . Feeling of Stress : Not on file  Social Connections:   . Frequency of Communication with Friends and Family: Not on file  . Frequency of Social Gatherings with Friends and Family: Not on file  . Attends Religious Services: Not on file  . Active Member of Clubs or Organizations: Not on file  . Attends  Archivist Meetings: Not on file  . Marital Status: Not on file     Family History: The patient's family history includes Heart attack (age of onset: 63) in his mother; Heart disease in his mother.  ROS:   Please see the history of present illness.    He is currently on a diet- one meal a day with protein bars in between.  This will last another week or so.  He is staying hydrated.   All other systems reviewed and are negative.  EKGs/Labs/Other Studies Reviewed:  The following studies were reviewed today: Echo Aug 2020  EKG:  EKG is ordered today.  The ekg ordered today demonstrates NSR, LAD, HR 56, Qs 3- AVF  Recent Labs: 09/10/2019: ALT 24; BUN 19; Creatinine, Ser 1.33; Potassium 4.7; Sodium 143  Recent Lipid Panel    Component Value Date/Time   CHOL 195 09/10/2019 1021   TRIG 145 09/10/2019 1021   HDL 31 (L) 09/10/2019 1021   CHOLHDL 6.3 (H) 09/10/2019 1021   CHOLHDL 5.8 05/16/2017 0337   VLDL 54 (H) 05/16/2017 0337   LDLCALC 138 (H) 09/10/2019 1021   LDLDIRECT 159.5 10/01/2012 0847    Physical Exam:    VS:  BP (!) 170/92   Pulse (!) 56   Temp (!) 97.3 F (36.3 C)   Ht 5' 8.5" (1.74 m)   Wt 174 lb 9.6 oz (79.2 kg)   SpO2 99%   BMI 26.16 kg/m     Wt Readings from Last 3 Encounters:  12/17/19 174 lb 9.6 oz (79.2 kg)  11/17/19 175 lb 9.6 oz (79.7 kg)  11/05/19 169 lb 9.6 oz (76.9 kg)     GEN:  Well nourished, well developed in no acute distress HEENT: Normal NECK: No JVD; No carotid bruits LYMPHATICS: No lymphadenopathy CARDIAC: RRR, 2/6 mid systolic murmur Lt sternal border,no rubs, gallops RESPIRATORY:  Clear to auscultation without rales, wheezing or rhonchi  ABDOMEN: Soft, non-tender, non-distended MUSCULOSKELETAL:  No edema; No deformity  SKIN: Warm and dry NEUROLOGIC:  Alert and oriented x 3 PSYCHIATRIC:  Normal affect   ASSESSMENT:    Near syncope Seen today with complaints of a near syncopal spell- brief- 3 weeks ago. R/O  arrhythmia   CAD S/P percutaneous coronary angioplasty a. 2007 Stent to LAD, PTCA D1;   b. DES to LAD & LCx 10/13 in the setting of STEMI. C. HSRA to LAD for ISR June 2019  Hypertrophic cardiomyopathy ([place]) No LVOT gradient on echo Aug 2020  Essential hypertension Repeat by me 142/82  Dyslipidemia Statin intolerance he is in the "Clear Study'  PLAN:    Check 7 day ZIO.  Virtual f/u after this.    Medication Adjustments/Labs and Tests Ordered: Current medicines are reviewed at length with the patient today.  Concerns regarding medicines are outlined above.  Orders Placed This Encounter  Procedures  . LONG TERM MONITOR (3-14 DAYS)  . EKG 12-Lead   No orders of the defined types were placed in this encounter.   Patient Instructions  Medication Instructions:  Your physician recommends that you continue on your current medications as directed. Please refer to the Current Medication list given to you today. *If you need a refill on your cardiac medications before your next appointment, please call your pharmacy*  Lab Work: NONE  If you have labs (blood work) drawn today and your tests are completely normal, you will receive your results only by: [name] Kitchen MyChart Message (if you have MyChart) OR . A paper copy in the mail If you have any lab test that is abnormal or we need to change your treatment, we will call you to review the results.  Testing/Procedures: Bryn Gulling- Long Term Monitor Instructions   Your physician has requested you wear your ZIO patch monitor 7 days.   This is a single patch monitor.  [employer] supplies one patch monitor per enrollment.  Additional stickers are not available.   Please do not apply patch if you will be having a Nuclear Stress Test, Echocardiogram, Cardiac CT, MRI, or  Chest Xray during the time frame you would be wearing the monitor. The patch cannot be worn during these tests.  You cannot remove and re-apply the ZIO XT patch monitor.   Your ZIO  patch monitor will be sent USPS Priority mail from [facility] directly to your home address. The monitor may also be mailed to a PO BOX if home delivery is not available.   It may take 3-5 days to receive your monitor after you have been enrolled.   Once you have received you monitor, please review enclosed instructions.  Your monitor has already been registered assigning a specific monitor serial # to you.   Applying the monitor   Shave hair from upper left chest.   Hold abrader disc by orange tab.  Rub abrader in 40 strokes over left upper chest as indicated in your monitor instructions.   Clean area with 4 enclosed alcohol pads .  Use all pads to assure are is cleaned thoroughly.  Let dry.   Apply patch as indicated in monitor instructions.  Patch will be place under collarbone on left side of chest with arrow pointing upward.   Rub patch adhesive wings for 2 minutes.Remove white label marked "1".  Remove white label marked "2".  Rub patch adhesive wings for 2 additional minutes.   While looking in a mirror, press and release button in center of patch.  A small green light will flash 3-4 times .  This will be your only indicator the monitor has been turned on.     Do not shower for the first 24 hours.  You may shower after the first 24 hours.   Press button if you feel a symptom. You will hear a small click.  Record Date, Time and Symptom in the Patient Log Book.   When you are ready to remove patch, follow instructions on last 2 pages of Patient Log Book.  Stick patch monitor onto last page of Patient Log Book.   Place Patient Log Book in [place] box.  Use locking tab on box and tape box closed securely.  The [employer] and [employer] has [employer] on it.  Please place in mailbox as soon as possible.  Your physician should have your test results approximately 7 days after the monitor has been mailed back to [facility].   Call [place] at [phone] if  you have questions regarding your ZIO XT patch monitor.  Call them immediately if you see an orange light blinking on your monitor.   If your monitor falls off in less than 4 days contact our Monitor department at [phone].  If your monitor becomes loose or falls off after 4 days call [employer] at [phone] for suggestions on securing your monitor.   This will be mailed to you, please expect 7-10 days to receive.        Follow-Up: At [facility], you and your health needs are our priority.  As part of our continuing mission to provide you with exceptional heart care, we have created designated Provider Care Teams.  These Care Teams include your primary Cardiologist (physician) and Advanced Practice Providers (APPs -  Physician Assistants and Nurse Practitioners) who all work together to provide you with the care you need, when you need it.  Your next appointment:   4 week(s)  The format for your next appointment:   Virtual Visit   Provider:   Kerin Ransom, PA-C    Your next appointment:   2  months   The format for your next appointment:   Office Visit   Provider:   Peter Martinique, MD  Other Instructions     Signed, Kerin Ransom, PA-C  12/17/2019 2:46 PM    [place]

## 2019-12-17 NOTE — Assessment & Plan Note (Signed)
Statin intolerance he is in the "Clear Study'

## 2019-12-17 NOTE — Assessment & Plan Note (Signed)
Repeat by me 142/82

## 2019-12-17 NOTE — Assessment & Plan Note (Signed)
Seen today with complaints of a near syncopal spell- brief- 3 weeks ago. R/O arrhythmia

## 2019-12-17 NOTE — Patient Instructions (Addendum)
Medication Instructions:  Your physician recommends that you continue on your current medications as directed. Please refer to the Current Medication list given to you today. *If you need a refill on your cardiac medications before your next appointment, please call your pharmacy*  Lab Work: NONE  If you have labs (blood work) drawn today and your tests are completely normal, you will receive your results only by: [name] Kitchen MyChart Message (if you have MyChart) OR . A paper copy in the mail If you have any lab test that is abnormal or we need to change your treatment, we will call you to review the results.  Testing/Procedures: Bryn Gulling- Long Term Monitor Instructions   Your physician has requested you wear your ZIO patch monitor 7 days.   This is a single patch monitor.  [employer] supplies one patch monitor per enrollment.  Additional stickers are not available.   Please do not apply patch if you will be having a Nuclear Stress Test, Echocardiogram, Cardiac CT, MRI, or Chest Xray during the time frame you would be wearing the monitor. The patch cannot be worn during these tests.  You cannot remove and re-apply the ZIO XT patch monitor.   Your ZIO patch monitor will be sent USPS Priority mail from [facility] directly to your home address. The monitor may also be mailed to a PO BOX if home delivery is not available.   It may take 3-5 days to receive your monitor after you have been enrolled.   Once you have received you monitor, please review enclosed instructions.  Your monitor has already been registered assigning a specific monitor serial # to you.   Applying the monitor   Shave hair from upper left chest.   Hold abrader disc by orange tab.  Rub abrader in 40 strokes over left upper chest as indicated in your monitor instructions.   Clean area with 4 enclosed alcohol pads .  Use all pads to assure are is cleaned thoroughly.  Let dry.   Apply patch as indicated in monitor instructions.   Patch will be place under collarbone on left side of chest with arrow pointing upward.   Rub patch adhesive wings for 2 minutes.Remove white label marked "1".  Remove white label marked "2".  Rub patch adhesive wings for 2 additional minutes.   While looking in a mirror, press and release button in center of patch.  A small green light will flash 3-4 times .  This will be your only indicator the monitor has been turned on.     Do not shower for the first 24 hours.  You may shower after the first 24 hours.   Press button if you feel a symptom. You will hear a small click.  Record Date, Time and Symptom in the Patient Log Book.   When you are ready to remove patch, follow instructions on last 2 pages of Patient Log Book.  Stick patch monitor onto last page of Patient Log Book.   Place Patient Log Book in [place] box.  Use locking tab on box and tape box closed securely.  The [employer] has [employer] on it.  Please place in mailbox as soon as possible.  Your physician should have your test results approximately 7 days after the monitor has been mailed back to [facility].   Call [place] at [phone] if you have questions regarding your ZIO XT patch monitor.  Call them immediately if you see an orange  light blinking on your monitor.   If your monitor falls off in less than 4 days contact our Monitor department at [phone].  If your monitor becomes loose or falls off after 4 days call [employer] at [phone] for suggestions on securing your monitor.   This will be mailed to you, please expect 7-10 days to receive.        Follow-Up: At [facility], you and your health needs are our priority.  As part of our continuing mission to provide you with exceptional heart care, we have created designated Provider Care Teams.  These Care Teams include your primary Cardiologist (physician) and Advanced Practice Providers (APPs -  Physician Assistants and  Nurse Practitioners) who all work together to provide you with the care you need, when you need it.  Your next appointment:   4 week(s)  The format for your next appointment:   Virtual Visit   Provider:   Kerin Ransom, PA-C    Your next appointment:   2 months   The format for your next appointment:   Office Visit   Provider:   Peter Martinique, MD  Other Instructions

## 2019-12-17 NOTE — Progress Notes (Signed)
Patient ID: Brandon Gonzales, male   DOB: [name] 24, 1951, 69 y.o.   MRN: [id number] 7 day ZIO XT long term holter monitor to be mailed to the patients home.

## 2019-12-17 NOTE — Assessment & Plan Note (Signed)
No LVOT gradient on echo Aug 2020

## 2019-12-20 ENCOUNTER — Ambulatory Visit (INDEPENDENT_AMBULATORY_CARE_PROVIDER_SITE_OTHER): Payer: Medicare HMO

## 2019-12-20 DIAGNOSIS — R55 Syncope and collapse: Secondary | ICD-10-CM

## 2019-12-24 ENCOUNTER — Other Ambulatory Visit: Payer: Self-pay | Admitting: Cardiology

## 2020-01-07 ENCOUNTER — Encounter: Payer: Self-pay | Admitting: Cardiology

## 2020-01-07 ENCOUNTER — Telehealth: Payer: Self-pay

## 2020-01-07 ENCOUNTER — Telehealth (INDEPENDENT_AMBULATORY_CARE_PROVIDER_SITE_OTHER): Payer: Medicare HMO | Admitting: Cardiology

## 2020-01-07 VITALS — BP 143/89 | HR 58 | Ht 68.5 in | Wt 165.0 lb

## 2020-01-07 DIAGNOSIS — I2510 Atherosclerotic heart disease of native coronary artery without angina pectoris: Secondary | ICD-10-CM

## 2020-01-07 DIAGNOSIS — Z9861 Coronary angioplasty status: Secondary | ICD-10-CM

## 2020-01-07 DIAGNOSIS — R55 Syncope and collapse: Secondary | ICD-10-CM | POA: Diagnosis not present

## 2020-01-07 DIAGNOSIS — I422 Other hypertrophic cardiomyopathy: Secondary | ICD-10-CM

## 2020-01-07 DIAGNOSIS — I4729 Other ventricular tachycardia: Secondary | ICD-10-CM

## 2020-01-07 DIAGNOSIS — E785 Hyperlipidemia, unspecified: Secondary | ICD-10-CM

## 2020-01-07 DIAGNOSIS — I472 Ventricular tachycardia: Secondary | ICD-10-CM

## 2020-01-07 NOTE — Progress Notes (Signed)
Virtual Visit via Telephone Note   This visit type was conducted due to national recommendations for restrictions regarding the COVID-19 Pandemic (e.g. social distancing) in an effort to limit this patient's exposure and mitigate transmission in our community.  Due to his co-morbid illnesses, this patient is at least at moderate risk for complications without adequate follow up.  This format is felt to be most appropriate for this patient at this time.  The patient did not have access to video technology/had technical difficulties with video requiring transitioning to audio format only (telephone).  All issues noted in this document were discussed and addressed.  No physical exam could be performed with this format.  Please refer to the patient's chart for his  consent to telehealth for [facility].   Date:  01/07/2020   ID:  Brandon Gonzales, DOB Nov 28, 1951, MRN [id number]  Patient Location: Home Provider Location: Home  PCP:  [name], NP  Cardiologist:  Peter Martinique, MD  Electrophysiologist:  None   Evaluation Performed:  Follow-Up Visit  Chief Complaint:  none  History of Present Illness:    Brandon Gonzales is a 69 y.o. male with a hx of CAD.  He had an LAD PCI with DES and a Dx1 POBA in 2007 in the setting of an MI.  He had another MI and 2013 and had a mid circumflex and proximal LAD DES placed.  His last intervention was in June 2019 when he had high-speed rotational atherectomy for LAD in-stent restenosis.  He is done well from a cardiac standpoint since then.  He does have a history of hypertrophic cardiomyopathy, his last echocardiogram in August 2020 showed no LV outflow tract gradient and normal LVF.  Other medical issues include hypertension and dyslipidemia.  He was enrolled in the "Clear Study" trial Dec 2020.  The patient was seen in the office 12/17/2019 after an episode of near syncope a couple on New Year's Day.  They had just eaten and he was in his car with his  daughter (who is a paramedic) driving a plate of food over to his brother's house.  While driving he became weak and near syncopal.  he denied any diaphoresis, shortness of breath, or nausea and vomiting.  His daughter took his pulse and thought he might be in atrial fibrillation but if it was present it did not last long.  His symptoms resolved quickly.  He did have a recurrent episode the following 2 days but none since.  He otherwise denies any unusual chest pain or unusual dyspnea.  He did have COVID back in October and says he has not completely recovered from that, he still feels generally fatigued.  I ordered a 7 day ZIO and he was contacted today for follow up.  His monitor showed episodic bradycardia down to the 40"s- usually early am.  He also had frequent PVCs and short runs of PSVT.  In addition he had runs of NSWCT that appear to be NSVT. The longest was 14 beats.  He has had no further episodes of near syncope or symptomatic tachycardia.  He denies chest pain or unusual dyspnea.   The patient does not have symptoms concerning for COVID-19 infection (fever, chills, cough, or new shortness of breath).    Past Medical History:  Diagnosis Date  . Arthritis   . Cancer (HCC)    SKIN CA  SQUAMOUS CELL  . Coronary artery disease    a. 2007 Stent to LAD, PTCA D1;  b. DES to LAD & LCx 10/13 in the setting of STEMI.  [name] Kitchen Dyslipidemia    a. Statin and zetia-intolerant. On niacin.  [name] Kitchen GERD (gastroesophageal reflux disease)   . Heart murmur   . Hypertension   . Hypertrophic cardiomyopathy ([place])    a. 09/2012 Echo: EF 55%, mid to apical anterolateral, posterior, apical anterior, apical HK, Gr1 DD, turbulence across LVOT suggesting degree of obstruction, mild MR, mildly dil LA.   Past Surgical History:  Procedure Laterality Date  . CARDIAC CATHETERIZATION  2007   stent to LAD and PTCA of diagonal branch  . CARDIAC CATHETERIZATION  2010  . CORONARY ANGIOPLASTY WITH STENT PLACEMENT  2013    95-99% prox LAD, patent LAD stent distal to this, occluded mid-dist LCx, mild <10% RCA irreg; s/p DES-prox LAD & DES to mid-dist LCx  . CORONARY BALLOON ANGIOPLASTY N/A 05/16/2017   Procedure: Coronary Balloon Angioplasty;  Surgeon: Leonie Man, MD;  Location: [place] CV [facility];  Service: Cardiovascular;  Laterality: N/A;  . [place]   right  . [place]   left  . LEFT HEART CATH Bilateral 09/22/2012   Procedure: LEFT HEART CATH;  Surgeon: Peter M Martinique, MD;  Location: [facility] CATH LAB;  Service: Cardiovascular;  Laterality: Bilateral;  . LEFT HEART CATH AND CORONARY ANGIOGRAPHY N/A 05/16/2017   Procedure: Left Heart Cath and Coronary Angiography;  Surgeon: Leonie Man, MD;  Location: [place] CV LAB;  Service: Cardiovascular;  Laterality: N/A;  . PERCUTANEOUS CORONARY STENT INTERVENTION (PCI-S) Right 09/22/2012   Procedure: PERCUTANEOUS CORONARY STENT INTERVENTION (PCI-S);  Surgeon: Peter M Martinique, MD;  Location: [facility] CATH LAB;  Service: Cardiovascular;  Laterality: Right;     Current Meds  Medication Sig  . AMBULATORY NON FORMULARY MEDICATION Take 180 mg by mouth daily. Medication Name: bempedoic acid vs a placebo CLEAR Research Study drug provided  . amLODipine (NORVASC) 2.5 MG tablet TAKE 1 TABLET BY MOUTH EVERY DAY  . aspirin EC 81 MG tablet Take 81 mg by mouth daily.  . calcium carbonate (TUMS EX) 750 MG chewable tablet Chew 2 tablets by mouth daily as needed for heartburn.  . carvedilol (COREG) 12.5 MG tablet TAKE 0.5 TABLETS (6.25 MG TOTAL) BY MOUTH 2 (TWO) TIMES DAILY.  [name] Kitchen Inositol Niacinate (NIACIN FLUSH FREE) 500 MG CAPS Take 1,000 mg by mouth daily.  [name] Kitchen lisinopril (ZESTRIL) 20 MG tablet Take 2 tablets (40 mg total) by mouth daily.  . nitroGLYCERIN (NITROSTAT) 0.4 MG SL tablet Place 1 tablet (0.4 mg total) under the tongue every 5 (five) minutes as needed for chest pain.  . valACYclovir (VALTREX) 500 MG tablet Take 1 tablet (500 mg total) by mouth daily.      Allergies:   Antihistamines, chlorpheniramine-type; Pheniramine; Statins; and Zetia [ezetimibe]   Social History   Tobacco Use  . Smoking status: Never Smoker  . Smokeless tobacco: Never Used  Substance Use Topics  . Alcohol use: No  . Drug use: No     Family Hx: The patient's family history includes Heart attack (age of onset: 75) in his mother; Heart disease in his mother.  ROS:   Please see the history of present illness.    All other systems reviewed and are negative.   Prior CV studies:   The following studies were reviewed today: Echo 07/14/2019 Monitor 01/07/2020  Labs/Other Tests and Data Reviewed:    EKG:  An ECG dated 12/17/2019 was personally reviewed today and demonstrated:  NSR, SB-56, IVCD, LAD, inferior Qs  Recent Labs: 09/10/2019: ALT 24; BUN 19; Creatinine, Ser 1.33; Potassium 4.7; Sodium 143   Recent Lipid Panel Lab Results  Component Value Date/Time   CHOL 195 09/10/2019 10:21 AM   TRIG 145 09/10/2019 10:21 AM   HDL 31 (L) 09/10/2019 10:21 AM   CHOLHDL 6.3 (H) 09/10/2019 10:21 AM   CHOLHDL 5.8 05/16/2017 03:37 AM   LDLCALC 138 (H) 09/10/2019 10:21 AM   LDLDIRECT 159.5 10/01/2012 08:47 AM    Wt Readings from Last 3 Encounters:  01/07/20 165 lb (74.8 kg)  12/17/19 174 lb 9.6 oz (79.2 kg)  11/17/19 175 lb 9.6 oz (79.7 kg)     Objective:    Vital Signs:  BP (!) 143/89   Pulse (!) 58   Ht 5' 8.5" (1.74 m)   Wt 165 lb (74.8 kg)   BMI 24.72 kg/m    VITAL SIGNS:  reviewed  ASSESSMENT & PLAN:    Near syncope 7 day revealed episodic bradycardia, ectopy, short PSVT runs, and NSWCT-14 beats  CAD S/P percutaneous coronary angioplasty a. 2007 Stent to LAD, PTCA D1;   b. DES to LAD & LCx 10/13 in the setting of STEMI. C. HSRA to LAD for ISR June 2019  Hypertrophic cardiomyopathy ([place]) NL LVF, no LVOT gradient on echo Aug 2020  Essential hypertension stable  Dyslipidemia Statin intolerance he is in the "Clear Study'  Plan: The  patient is on Coreg and will continue current dose. I will ask Dr Martinique to review the monitor results.  The patient has a f/u with Dr Martinique in March and will keep this.  I did order an echo to be done prior to that visit to r/o post COVID cardiomyopathy. He knows to contact us if he has recurrent near syncope or tachycardia.   COVID-19 Education: The signs and symptoms of COVID-19 were discussed with the patient and how to seek care for testing (follow up with PCP or arrange E-visit).  The importance of social distancing was discussed today.  Time:   Today, I have spent 15 minutes with the patient with telehealth technology discussing the above problems.     Medication Adjustments/Labs and Tests Ordered: Current medicines are reviewed at length with the patient today.  Concerns regarding medicines are outlined above.   Tests Ordered: No orders of the defined types were placed in this encounter.   Medication Changes: No orders of the defined types were placed in this encounter.   Follow Up:  In Person keep f/u with Dr Martinique as scheduled.   Signed, Kerin Ransom, PA-C  01/07/2020 2:52 PM    [facility]

## 2020-01-07 NOTE — Progress Notes (Signed)
I think rather than repeating Echo which he had last August I would get an MRI. This would further define degree of HOCM and whether he has late gadolinium enhancement which would put him at greater risk of sudden death.   Thanks  Collier Salina

## 2020-01-07 NOTE — Telephone Encounter (Signed)
Contacted patient to discuss AVS Instructions. Gave patient Luke's recommendations from today's virtual office visit. Follow up appointment has been scheduled. Patient voiced understanding; AVS printed and mailed to patient.

## 2020-01-07 NOTE — Telephone Encounter (Signed)

## 2020-01-07 NOTE — Patient Instructions (Signed)
Medication Instructions:  Your physician recommends that you continue on your current medications as directed. Please refer to the Current Medication list given to you today. *If you need a refill on your cardiac medications before your next appointment, please call your pharmacy*  Lab Work: None  If you have labs (blood work) drawn today and your tests are completely normal, you will receive your results only by: [name] Kitchen MyChart Message (if you have MyChart) OR . A paper copy in the mail If you have any lab test that is abnormal or we need to change your treatment, we will call you to review the results.  Testing/Procedures: Your physician has requested that you have an echocardiogram. Echocardiography is a painless test that uses sound waves to create images of your heart. It provides your doctor with information about the size and shape of your heart and how well your heart's chambers and valves are working. This procedure takes approximately one hour. There are no restrictions for this procedure. THIS TEST WILL BE COMPLETED AT [place] [address]  Follow-Up: At [facility], you and your health needs are our priority.  As part of our continuing mission to provide you with exceptional heart care, we have created designated Provider Care Teams.  These Care Teams include your primary Cardiologist (physician) and Advanced Practice Providers (APPs -  Physician Assistants and Nurse Practitioners) who all work together to provide you with the care you need, when you need it.  Your next appointment:   Follow up in March as scheduled  The format for your next appointment:   In Person  Provider:   Peter Martinique, MD  Other Instructions

## 2020-01-12 ENCOUNTER — Other Ambulatory Visit: Payer: Self-pay

## 2020-01-12 DIAGNOSIS — R55 Syncope and collapse: Secondary | ICD-10-CM

## 2020-01-12 DIAGNOSIS — I422 Other hypertrophic cardiomyopathy: Secondary | ICD-10-CM

## 2020-01-13 ENCOUNTER — Telehealth: Payer: Self-pay

## 2020-01-13 NOTE — Progress Notes (Signed)
Tee please cancel the echo I ordered and order a cardiac MRI -see Dr Doug Sou note.  The patient has a f/u with dr Martinique in March and should keep this.  Kerin Ransom PA-C 01/13/2020 8:10 AM

## 2020-01-13 NOTE — Telephone Encounter (Signed)
Called patient to give Brandon Gonzales recommendations; he voiced understanding and stated Dr Doug Sou nurse gave him a call yesterday. And he was aware of the cardiac mri.

## 2020-01-15 ENCOUNTER — Telehealth: Payer: Self-pay

## 2020-01-15 ENCOUNTER — Other Ambulatory Visit: Payer: Self-pay

## 2020-01-15 DIAGNOSIS — Z01812 Encounter for preprocedural laboratory examination: Secondary | ICD-10-CM

## 2020-01-15 DIAGNOSIS — I422 Other hypertrophic cardiomyopathy: Secondary | ICD-10-CM

## 2020-01-15 NOTE — Telephone Encounter (Signed)
Left message on patient's personal voice mail cardiac mri has to be approved with your insurance before it is scheduled.You will need to come to our office lab and have a bmet.Advised ok to have done tomorrow or first of next week.Scheduler will be calling back with mri appt once approved with your insurance.

## 2020-01-19 ENCOUNTER — Telehealth: Payer: Self-pay | Admitting: Cardiology

## 2020-01-19 ENCOUNTER — Other Ambulatory Visit (HOSPITAL_COMMUNITY): Payer: Medicare HMO

## 2020-01-19 NOTE — Telephone Encounter (Signed)
Called to speak with patient regarding the Cardiac MRI that was ordered by Dr. Martinique.  Patient states he is extremely claustrophobic and cannot have this done.  [facility] does not have an open MRI--please advise.

## 2020-01-21 ENCOUNTER — Telehealth: Payer: Self-pay

## 2020-01-21 MED ORDER — DIAZEPAM 10 MG PO TABS: ORAL_TABLET | ORAL | 0 refills | Status: DC

## 2020-01-21 NOTE — Telephone Encounter (Signed)
Spoke to patient advised I spoke to cardiac mri tech.She stated you are able to tilt your head back and look out while having cardiac mri.Dr.Jordan advised to take valium 10 mg 30 min to 1 hour before having done.Stated he will try.Advised scheduler will call back to schedule.

## 2020-01-26 NOTE — Telephone Encounter (Signed)
See 01/21/20 telephone note.

## 2020-01-31 ENCOUNTER — Other Ambulatory Visit: Payer: Self-pay | Admitting: Cardiology

## 2020-02-03 ENCOUNTER — Telehealth: Payer: Self-pay | Admitting: Cardiology

## 2020-02-03 ENCOUNTER — Ambulatory Visit: Payer: Medicare HMO | Admitting: Cardiology

## 2020-02-03 ENCOUNTER — Encounter: Payer: Medicare HMO | Admitting: Adult Health

## 2020-02-03 ENCOUNTER — Encounter: Payer: Self-pay | Admitting: Cardiology

## 2020-02-03 MED ORDER — LISINOPRIL 40 MG PO TABS: 40.0000 mg | ORAL_TABLET | Freq: Every day | ORAL | 3 refills | Status: DC

## 2020-02-03 NOTE — Telephone Encounter (Signed)
Spoke with patient regarding appointment for cardiac mri scheduled 02/25/20 at 3:00pm---arrival time is 2:15pm 1st floor radiology.  Will mail information to pateint and it is also on My Chart.

## 2020-02-05 ENCOUNTER — Ambulatory Visit: Payer: Medicare HMO | Admitting: Cardiology

## 2020-02-12 DIAGNOSIS — D485 Neoplasm of uncertain behavior of skin: Secondary | ICD-10-CM | POA: Diagnosis not present

## 2020-02-12 DIAGNOSIS — L820 Inflamed seborrheic keratosis: Secondary | ICD-10-CM | POA: Diagnosis not present

## 2020-02-12 DIAGNOSIS — Z85828 Personal history of other malignant neoplasm of skin: Secondary | ICD-10-CM | POA: Diagnosis not present

## 2020-02-24 ENCOUNTER — Telehealth (HOSPITAL_COMMUNITY): Payer: Self-pay | Admitting: Emergency Medicine

## 2020-02-24 ENCOUNTER — Encounter (HOSPITAL_COMMUNITY): Payer: Self-pay

## 2020-02-24 NOTE — Telephone Encounter (Signed)
Reaching out to patient to offer assistance regarding upcoming cardiac imaging study; pt verbalizes understanding of appt date/time, parking situation and where to check in, and verified current allergies; name and call back number provided for further questions should they arise Marchia Bond RN Navigator Cardiac Imaging [place] [facility] [phone] office [phone] cell    Pt aware of his rx for valium but decided he didn't want to take or even pick up from pharm. Stated "I will just suffer through it". I explained that he will be in the machine up for 45 mins and with an IV for contrast. Pt verbalized understanding. appreciated the explaination.  Denies implants; has SEVERE CLAUSTRO, denies diabetes supplies

## 2020-02-25 ENCOUNTER — Ambulatory Visit (HOSPITAL_COMMUNITY)
Admission: RE | Admit: 2020-02-25 | Discharge: 2020-02-25 | Disposition: A | Discharge status: Home / Self Care | Payer: Medicare HMO | Source: Ambulatory Visit | Attending: Cardiology | Admitting: Cardiology

## 2020-02-25 ENCOUNTER — Other Ambulatory Visit: Payer: Self-pay

## 2020-02-25 DIAGNOSIS — I422 Other hypertrophic cardiomyopathy: Secondary | ICD-10-CM | POA: Insufficient documentation

## 2020-02-25 DIAGNOSIS — R55 Syncope and collapse: Secondary | ICD-10-CM | POA: Diagnosis not present

## 2020-02-25 LAB — CREATININE, SERUM
Creatinine, Ser: 1.27 mg/dL — ABNORMAL HIGH (ref 0.61–1.24)
GFR calc Af Amer: >60 mL/min (ref >60)
GFR calc non Af Amer: 57 mL/min — ABNORMAL LOW (ref >60)

## 2020-02-25 MED ORDER — GADOBUTROL 1 MMOL/ML IV SOLN
10.0000 mL | Freq: Once | INTRAVENOUS | Status: AC | PRN
  Administered 2020-02-25: 10 mL via INTRAVENOUS

## 2020-03-01 ENCOUNTER — Other Ambulatory Visit: Payer: Self-pay

## 2020-03-01 DIAGNOSIS — I422 Other hypertrophic cardiomyopathy: Secondary | ICD-10-CM

## 2020-03-01 DIAGNOSIS — R55 Syncope and collapse: Secondary | ICD-10-CM

## 2020-03-11 ENCOUNTER — Other Ambulatory Visit: Payer: Self-pay | Admitting: Cardiology

## 2020-03-23 ENCOUNTER — Other Ambulatory Visit: Payer: Self-pay

## 2020-03-23 ENCOUNTER — Encounter: Payer: Self-pay | Admitting: Internal Medicine

## 2020-03-23 ENCOUNTER — Ambulatory Visit: Payer: Medicare HMO | Admitting: Internal Medicine

## 2020-03-23 VITALS — BP 156/84 | HR 62 | Ht 68.5 in | Wt 172.2 lb

## 2020-03-23 DIAGNOSIS — I422 Other hypertrophic cardiomyopathy: Secondary | ICD-10-CM

## 2020-03-23 LAB — CBC WITH DIFFERENTIAL/PLATELET
Basophils Absolute: 0 10*3/uL (ref 0.0–0.2)
Basos: 0 %
EOS (ABSOLUTE): 0.1 10*3/uL (ref 0.0–0.4)
Eos: 2 %
Hematocrit: 44.8 % (ref 37.5–51.0)
Hemoglobin: 15.9 g/dL (ref 13.0–17.7)
Immature Grans (Abs): 0 10*3/uL (ref 0.0–0.1)
Immature Granulocytes: 0 %
Lymphocytes Absolute: 1 10*3/uL (ref 0.7–3.1)
Lymphs: 16 %
MCH: 30.8 pg (ref 26.6–33.0)
MCHC: 35.5 g/dL (ref 31.5–35.7)
MCV: 87 fL (ref 79–97)
Monocytes Absolute: 0.6 10*3/uL (ref 0.1–0.9)
Monocytes: 9 %
Neutrophils Absolute: 4.6 10*3/uL (ref 1.4–7.0)
Neutrophils: 73 %
Platelets: 153 10*3/uL (ref 150–450)
RBC: 5.16 x10E6/uL (ref 4.14–5.80)
RDW: 12.2 % (ref 11.6–15.4)
WBC: 6.4 10*3/uL (ref 3.4–10.8)

## 2020-03-23 LAB — BASIC METABOLIC PANEL
BUN/Creatinine Ratio: 24 (ref 10–24)
BUN: 23 mg/dL (ref 8–27)
CO2: 24 mmol/L (ref 20–29)
Calcium: 9.7 mg/dL (ref 8.6–10.2)
Chloride: 104 mmol/L (ref 96–106)
Creatinine, Ser: 0.96 mg/dL (ref 0.76–1.27)
GFR calc Af Amer: 92 mL/min/{1.73_m2} (ref >59)
GFR calc non Af Amer: 80 mL/min/{1.73_m2} (ref >59)
Glucose: 105 mg/dL — ABNORMAL HIGH (ref 65–99)
Potassium: 4.5 mmol/L (ref 3.5–5.2)
Sodium: 141 mmol/L (ref 134–144)

## 2020-03-23 NOTE — Progress Notes (Signed)
HPI Brandon Gonzales is referred today by Dr. Martinique to consider ICD insertion. He is a pleasant 69 yo man with CAD, s/p MI, s/p multiple PCI's. He has HTN. He has sinus node dysfunction and was diagnosed with HCM with obstruction by cardiac MRI. His maximal dimension was 18 mm. He has documented sinus node dysfunction with daytime HR's in the 40's. The degree of Gadolinium uptake and the MRI with regard to his HCM could not be quantified due to prior SEMI scar being present. Allergies  Allergen Reactions  . Antihistamines, Chlorpheniramine-Type Other (See Comments)    Altered mental status  . Pheniramine Other (See Comments)    Altered mental status  . Statins Other (See Comments)    Leg cramps    . Zetia [Ezetimibe] Other (See Comments)    Leg cramps     Current Outpatient Medications  Medication Sig Dispense Refill  . AMBULATORY NON FORMULARY MEDICATION Take 180 mg by mouth daily. Medication Name: bempedoic acid vs a placebo CLEAR Research Study drug provided    . amLODipine (NORVASC) 2.5 MG tablet TAKE 1 TABLET BY MOUTH EVERY DAY 90 tablet 2  . aspirin EC 81 MG tablet Take 81 mg by mouth daily.    . calcium carbonate (TUMS EX) 750 MG chewable tablet Chew 2 tablets by mouth daily as needed for heartburn.    . carvedilol (COREG) 12.5 MG tablet TAKE 0.5 TABLETS (6.25 MG TOTAL) BY MOUTH 2 (TWO) TIMES DAILY. 90 tablet 3  . Inositol Niacinate (NIACIN FLUSH FREE) 500 MG CAPS Take 1,000 mg by mouth daily.    [name] Kitchen lisinopril (ZESTRIL) 40 MG tablet Take 1 tablet (40 mg total) by mouth daily. 90 tablet 3  . valACYclovir (VALTREX) 500 MG tablet Take 1 tablet (500 mg total) by mouth daily. 90 tablet 1  . nitroGLYCERIN (NITROSTAT) 0.4 MG SL tablet Place 1 tablet (0.4 mg total) under the tongue every 5 (five) minutes as needed for chest pain. 25 tablet 3   No current facility-administered medications for this visit.     Past Medical History:  Diagnosis Date  . Arthritis   . Cancer (HCC)    SKIN CA  SQUAMOUS CELL  . Coronary artery disease    a. 2007 Stent to LAD, PTCA D1;  b. DES to LAD & LCx 10/13 in the setting of STEMI.  [name] Kitchen Dyslipidemia    a. Statin and zetia-intolerant. On niacin.  [name] Kitchen GERD (gastroesophageal reflux disease)   . Heart murmur   . Hypertension   . Hypertrophic cardiomyopathy ([place])    a. 09/2012 Echo: EF 55%, mid to apical anterolateral, posterior, apical anterior, apical HK, Gr1 DD, turbulence across LVOT suggesting degree of obstruction, mild MR, mildly dil LA.    ROS:   All systems reviewed and negative except as noted in the HPI.   Past Surgical History:  Procedure Laterality Date  . CARDIAC CATHETERIZATION  2007   stent to LAD and PTCA of diagonal branch  . CARDIAC CATHETERIZATION  2010  . CORONARY ANGIOPLASTY WITH STENT PLACEMENT  2013   95-99% prox LAD, patent LAD stent distal to this, occluded mid-dist LCx, mild <10% RCA irreg; s/p DES-prox LAD & DES to mid-dist LCx  . CORONARY BALLOON ANGIOPLASTY N/A 05/16/2017   Procedure: Coronary Balloon Angioplasty;  Surgeon: Leonie Man, MD;  Location: [place] CV LAB;  Service: Cardiovascular;  Laterality: N/A;  . [place]   right  . [place]  left  . LEFT HEART CATH Bilateral 09/22/2012   Procedure: LEFT HEART CATH;  Surgeon: Peter M Martinique, MD;  Location: [facility] CATH LAB;  Service: Cardiovascular;  Laterality: Bilateral;  . LEFT HEART CATH AND CORONARY ANGIOGRAPHY N/A 05/16/2017   Procedure: Left Heart Cath and Coronary Angiography;  Surgeon: Leonie Man, MD;  Location: [place] CV LAB;  Service: Cardiovascular;  Laterality: N/A;  . PERCUTANEOUS CORONARY STENT INTERVENTION (PCI-S) Right 09/22/2012   Procedure: PERCUTANEOUS CORONARY STENT INTERVENTION (PCI-S);  Surgeon: Peter M Martinique, MD;  Location[facility] CATH LAB;  Service: Cardiovascular;  Laterality: Right;     Family History  Problem Relation Age of Onset  . Heart attack Mother 23  . Heart disease Mother       Social History   Socioeconomic History  . Marital status: Married    Spouse name: Not on file  . Number of children: Not on file  . Years of education: Not on file  . Highest education level: Not on file  Occupational History  . Not on file  Tobacco Use  . Smoking status: Never Smoker  . Smokeless tobacco: Never Used  Substance and Sexual Activity  . Alcohol use: No  . Drug use: No  . Sexual activity: Not Currently  Other Topics Concern  . Not on file  Social History Narrative  . Not on file   Social Determinants of Health   Financial Resource Strain:   . Difficulty of Paying Living Expenses:   Food Insecurity:   . Worried About Charity fundraiser in the Last Year:   . Arboriculturist in the Last Year:   Transportation Needs:   . Film/video editor (Medical):   [name] Kitchen Lack of Transportation (Non-Medical):   Physical Activity:   . Days of Exercise per Week:   . Minutes of Exercise per Session:   Stress:   . Feeling of Stress :   Social Connections:   . Frequency of Communication with Friends and Family:   . Frequency of Social Gatherings with Friends and Family:   . Attends Religious Services:   . Active Member of Clubs or Organizations:   . Attends Archivist Meetings:   [name] Kitchen Marital Status:   Intimate Partner Violence:   . Fear of Current or Ex-Partner:   . Emotionally Abused:   [name] Kitchen Physically Abused:   . Sexually Abused:      BP (!) 156/84   Pulse 62   Ht 5' 8.5" (1.74 m)   Wt 172 lb 3.2 oz (78.1 kg)   SpO2 98%   BMI 25.80 kg/m   Physical Exam:  Well appearing NAD HEENT: Unremarkable Neck:  No JVD, no thyromegally Lymphatics:  No adenopathy Back:  No CVA tenderness Lungs:  Clear with no wheezes HEART:  Regular rate rhythm, no murmurs, no rubs, no clicks Abd:  soft, positive bowel sounds, no organomegally, no rebound, no guarding Ext:  2 plus pulses, no edema, no cyanosis, no clubbing Skin:  No rashes no nodules Neuro:  CN II  through XII intact, motor grossly intact   Assess/Plan: 1. HCM - he has NSVT and gadolinium uptake and has had near syncope. I have discussed the treatment options with the patient and recommended ICD insertion.  2. CAD - he is s/p MI and is s/p stenting. He denies anginal symptoms. I would like to uptitrate his beta blockers. 3. Sinus node dysfunction - his cardiac monitor demonstrates sinus brady during the daytime in  the 40's. He will benefit from an atrial lead. 4. VT - he has symptomatic NSVT. I would like to uptitrate his beta blocker but cannot do so due to the sinus node dysfunction. We plan to increase the beta blocker after an atrial lead has been placed. Ultimately he may need amiodarone.  Mikle Bosworth.D.

## 2020-03-23 NOTE — Progress Notes (Signed)
Cardiology Office Note   Date:  03/31/2020   ID:  Brandon Gonzales, DOB 1951/10/15, MRN [id number]  PCP:  [name], NP  Cardiologist:   Lynnzie Blackson Martinique, MD   Chief Complaint  Patient presents with  . Coronary Artery Disease      History of Present Illness: Brandon Gonzales is a 69 y.o. male who presents for follow up CAD and HCM. He has a PMH including CAD prior mid LAD stenting in 2007 with  PTCA to D1, followed by lateral STEMI and PCI/DES to the midLCX & proximalLAD in 2013. S/p cutting balloon PTCA of the LAD in June 2019 for in stent restenosis.  He also has a h/o HTN, HL, GERD, and hypertrophic cardiomyopathy.    He called on 02/19/17 with complaints of dyspnea and dizziness that he states were similar to when he had his MI. He felt very flushed in his face. He was referred to the ED.  BP recorded by EMS was 210/107. Came down to 159/87 in ED.  Ecg showed NSR with a RBBB- new in January 2018. No acute change. Delta troponin was normal. UA, BMET, BNP were all normal. He was discharged on amlodipine 5 mg daily.   He was evaluated with Echo in April 2018 showing moderate LVH with severe basal septal hypertrophy. No significant outflow gradient. Mild MR. Myoview study showed a small area of scar at the base of the lateral wall. No ischemia. EF 51%. He presented in June 2018 with symptoms of worsening chest pain and dyspnea on exertion. He underwent cardiac cath showing 95% in stent restenosis of the proximal LAD treated with cutting  balloon angioplasty. There was a 65% mid RCA stenosis and 50% in the mid LCx. He was enrolled in the CLEAR trial for hyperlipidemia given history of statin and Zetia intolerance.   The patient was seen in the office 12/17/2019 after an episode of near syncope a couple on New Year's Day. They had just eaten and he was in his car with his daughter (who is a paramedic) driving a plate of food over to his brother's house. While driving he became weak and near  syncopal. he denied any diaphoresis, shortness of breath, or nausea and vomiting. His daughter took his pulse and thought he might be in atrial fibrillation but if it was present it did not last long. His symptoms resolved quickly. He did have a recurrent episode the following 2 days but none since. He otherwise denies any unusual chest pain or unusual dyspnea. He did have COVIDback in October and says he has not completely recovered from that, he still feels generally fatigued.  A 7 day ZIO monitor was done and showed episodic bradycardia down to the 40"s- usually early am.  He also had frequent PVCs and short runs of PSVT.  In addition he had runs of NSWCT that appear to be NSVT. The longest was 14 beats.  He has had no further episodes of near syncope or symptomatic tachycardia.  He denies chest pain or unusual dyspnea. Cardiac MRI was ordered with results noted below. Given syncopal episode, wide complex tachycardia noted on monitor, HCM with late gadolinium uptake on MRI and relatively low EF 52% I recommended EP evaluation to see if ICD indicated. Seen by Dr Lovena Le on 03/23/20. ICD recommended.  On follow up today patient feels great. No chest pain, dyspnea, palpitations or dizziness. No further syncope. Has decided he doesn't want to proceed with ICD placement at  this time.   Past Medical History:  Diagnosis Date  . Arthritis   . Cancer (HCC)    SKIN CA  SQUAMOUS CELL  . Coronary artery disease    a. 2007 Stent to LAD, PTCA D1;  b. DES to LAD & LCx 10/13 in the setting of STEMI.  [name] Kitchen Dyslipidemia    a. Statin and zetia-intolerant. On niacin.  [name] Kitchen GERD (gastroesophageal reflux disease)   . Heart murmur   . Hypertension   . Hypertrophic cardiomyopathy ([place])    a. 09/2012 Echo: EF 55%, mid to apical anterolateral, posterior, apical anterior, apical HK, Gr1 DD, turbulence across LVOT suggesting degree of obstruction, mild MR, mildly dil LA.    Past Surgical History:  Procedure Laterality  Date  . CARDIAC CATHETERIZATION  2007   stent to LAD and PTCA of diagonal branch  . CARDIAC CATHETERIZATION  2010  . CORONARY ANGIOPLASTY WITH STENT PLACEMENT  2013   95-99% prox LAD, patent LAD stent distal to this, occluded mid-dist LCx, mild <10% RCA irreg; s/p DES-prox LAD & DES to mid-dist LCx  . CORONARY BALLOON ANGIOPLASTY N/A 05/16/2017   Procedure: Coronary Balloon Angioplasty;  Surgeon: Leonie Man, MD;  Location: [facility] CV [facility];  Service: Cardiovascular;  Laterality: N/A;  . [place]   right  . [place]   left  . LEFT HEART CATH Bilateral 09/22/2012   Procedure: LEFT HEART CATH;  Surgeon: Edeline Greening M Martinique, MD;  Location: [facility] CATH LAB;  Service: Cardiovascular;  Laterality: Bilateral;  . LEFT HEART CATH AND CORONARY ANGIOGRAPHY N/A 05/16/2017   Procedure: Left Heart Cath and Coronary Angiography;  Surgeon: Leonie Man, MD;  Location: [facility] CV LAB;  Service: Cardiovascular;  Laterality: N/A;  . PERCUTANEOUS CORONARY STENT INTERVENTION (PCI-S) Right 09/22/2012   Procedure: PERCUTANEOUS CORONARY STENT INTERVENTION (PCI-S);  Surgeon: Ishia Tenorio M Martinique, MD;  Location[facility] C[facility] LAB;  Service: Cardiovascular;  Laterality: Right;     Current Outpatient Medications  Medication Sig Dispense Refill  . AMBULATORY NON FORMULARY MEDICATION Take 180 mg by mouth daily. Medication Name: bempedoic acid vs a placebo CLEAR Research Study drug provided    . amLODipine (NORVASC) 2.5 MG tablet TAKE 1 TABLET BY MOUTH EVERY DAY (Patient taking differently: Take 2.5 mg by mouth daily. ) 90 tablet 2  . aspirin EC 81 MG tablet Take 81 mg by mouth daily.    . calcium carbonate (TUMS EX) 750 MG chewable tablet Chew 2 tablets by mouth daily as needed for heartburn.    . carvedilol (COREG) 12.5 MG tablet TAKE 0.5 TABLETS (6.25 MG TOTAL) BY MOUTH 2 (TWO) TIMES DAILY. 90 tablet 3  . esomeprazole (NEXIUM) 20 MG capsule Take 20 mg by mouth every morning.    . Inositol Niacinate  (NIACIN FLUSH FREE) 500 MG CAPS Take 1,000 mg by mouth daily.    [name] Kitchen lisinopril (ZESTRIL) 40 MG tablet Take 1 tablet (40 mg total) by mouth daily. 90 tablet 3  . valACYclovir (VALTREX) 500 MG tablet Take 1 tablet (500 mg total) by mouth daily. (Patient taking differently: Take 500 mg by mouth daily as needed (fever blisters). ) 90 tablet 1  . nitroGLYCERIN (NITROSTAT) 0.4 MG SL tablet Place 1 tablet (0.4 mg total) under the tongue every 5 (five) minutes as needed for chest pain. 25 tablet 3   No current facility-administered medications for this visit.    Allergies:   Antihistamines, chlorpheniramine-type; Pheniramine; Statins; and Zetia [ezetimibe]  Social History:  The patient  reports that he has never smoked. He has never used smokeless tobacco. He reports that he does not drink alcohol or use drugs.   Family History:  The patient's family history includes Heart attack (age of onset: 49) in his mother; Heart disease in his mother.    ROS:  Please see the history of present illness.   Otherwise, review of systems are positive for none.   All other systems are reviewed and negative.    PHYSICAL EXAM: VS:  BP 128/80   Pulse 76   Temp (!) 97.3 F (36.3 C)   Ht 5' 8.5" (1.74 m)   Wt 173 lb 12.8 oz (78.8 kg)   SpO2 96%   BMI 26.04 kg/m  , BMI Body mass index is 26.04 kg/m. GEN: Well nourished, well developed, in no acute distress  HEENT: normal  Neck: no JVD, carotid bruits, or masses Cardiac: RRR;  rubs, or gallops,no edema. Harsh gr 3/6 systolic murmur LSB and apex. Respiratory:  clear to auscultation bilaterally, normal work of breathing GI: soft, nontender, nondistended, + BS MS: no deformity or atrophy  Skin: warm and dry, no rash Neuro:  Strength and sensation are intact Psych: euthymic mood, full affect   EKG:  EKG is not ordered today. The ekg ordered today demonstrates N/A   Recent Labs: 09/10/2019: ALT 24 03/23/2020: BUN 23; Creatinine, Ser 0.96; Hemoglobin 15.9;  Platelets 153; Potassium 4.5; Sodium 141    Lipid Panel    Component Value Date/Time   CHOL 195 09/10/2019 1021   TRIG 145 09/10/2019 1021   HDL 31 (L) 09/10/2019 1021   CHOLHDL 6.3 (H) 09/10/2019 1021   CHOLHDL 5.8 05/16/2017 0337   VLDL 54 (H) 05/16/2017 0337   LDLCALC 138 (H) 09/10/2019 1021   LDLDIRECT 159.5 10/01/2012 0847      Wt Readings from Last 3 Encounters:  03/31/20 173 lb 12.8 oz (78.8 kg)  03/23/20 172 lb 3.2 oz (78.1 kg)  01/07/20 165 lb (74.8 kg)      Other studies Reviewed: Additional studies/ records that were reviewed today include:   Cardiac cath 05/16/17:  Coronary Balloon Angioplasty  Left Heart Cath and Coronary Angiography  Conclusion    Ost RCA lesion, 40 %stenosed. Mid RCA lesion, 65 %stenosed. Borderline significant.  The left ventricular systolic function is normal.  LV end diastolic pressure is mildly elevated.  There is no mitral valve regurgitation.  There is no aortic valve stenosis.  Prox LAD to Mid LAD bare-metal stent, 20 %stenosed.  Mid LAD lesion, 40 %stenosed. Localized segment of previous bare-metal stent-  2nd Mrg DES stent, 0 %stenosed.  Prox Cx to Mid Cx lesion, 40 %stenosed.  Ost LAD lesion, 95 %stenosed.  Post intervention, there is a 10% residual stenosis.   Mr. Gieser has several potential culprit lesions, however the most obvious one in the LAD with 95% stenosis. This is in-stent restenosis, therefore treated with balloon angioplasty using the [facility] Cutting Balloon followed by post-dilation with a noncompliant balloon.  The RCA lesion is of borderline significance, but does not appear to be acute in nature. Would defer intervention on this vessel at this time unless she has ongoing or worsening symptoms.   Plan: Overnight monitoring and likely discharge tomorrow Return to nursing unit for ongoing care and TR been removal. Back on dual antiplatelet therapy.- Reduced aspirin 81 mg him and using  Brilinta. Further risk factor modification per rounding team.   He can follow-up with  Dr. Martinique, will need to have a follow-up appointment rescheduled as he was post be there tomorrow June 14.    Brandon Gonzales, M.D., M.S. Interventional Cardiologist      Echo 07/14/19: IMPRESSIONS    1. The left ventricle has normal systolic function with an ejection  fraction of 60-65%. The cavity size was normal. Severe basal septal  hypertrophy and mild concentric hypertrophy. Left ventricular diastolic  Doppler parameters are consistent with  pseudonormalization. Elevated left ventricular end-diastolic pressure.  2. The average left ventricular global longitudinal strain is -16.0 %.  3. The right ventricle has normal systolic function. The cavity was  normal. There is no increase in right ventricular wall thickness.  4. Left atrial size was mildly dilated.  5. The aortic valve is tricuspid. Moderate sclerosis of the aortic valve.  Aortic valve regurgitation is mild by color flow Doppler.  6. The aorta is normal in size and structure.   Cardiac MRI 02/25/20: CLINICAL DATA:  Hypertrophic cardiomyopathy suspected, further testing  COMPARISON: Echocardiogram 07/14/2019  EXAM: CARDIAC MRI  TECHNIQUE: The patient was scanned on a 1.5 Tesla GE magnet. A dedicated cardiac coil was used. Functional imaging was done using Fiesta sequences. 2,3, and 4 chamber views were done to assess for RWMA's. Modified Simpson's rule using a short axis stack was used to calculate an ejection fraction on a dedicated work Conservation officer, nature. The patient received 63mL GADAVIST GADOBUTROL 1 MMOL/ML IV SOLN. After 10 minutes inversion recovery sequences were used to assess for infiltration and scar tissue.  CONTRAST:  29mL GADAVIST GADOBUTROL 1 MMOL/ML IV SOLN  FINDINGS: LEFT VENTRICLE:  Normal LV chamber size.  Wall thickness is increased in the basal septum as described  below. Wall thinning in the anterior wall and inferolateral wall. The lateral wall demonstrates scar and is significantly thinned.  Akinesis of the inferolateral wall from base to mid ventricle, and hypokinesis of lateral apex. Hypokinesis of the anterior wall from base to apex.  LVEF = 52%  Maximal wall thickness: 18 mm  Location: Basal anteroseptum  There is systolic anterior motion of the mitral valve. Flow dephasing suggests left ventricular outflow tract obstruction. Mitral regurgitation is seen, posteriorly directed, eccentric and qualitatively mild-moderate.  No evidence of left ventricular apical aneurysm.  Findings are consistent with hypertrophic cardiomyopathy with obstruction. Morphologic subtype: Sigmoid.  There is late gadolinium enhancement in the left ventricular myocardium.  LGE noted subendocardium of the inferolateral and the inferior portion of the anterolateral wall from base to mid ventricle, including a mildly aneurysmal appearing inferolateral mid ventricular segment. Delayed myocardial enhancement is <50% of the thickness of the lateral wall, suggesting viability in this coronary distribution.  There is also LGE in the subendocardium of the anterior wall from base to mid ventricle. Delayed myocardial enhancement is <50% of the thickness of the anterior wall, suggesting viability in this coronary distribution.  Mild delayed myocardial enhancement noted at the RV insertion points. This represents <1% of the myocardial mass. Quantitation of LGE not performed in the setting of prominent delayed gadolinium enhancement from ischemic heart disease.  Normal T1 myocardial nulling kinetics suggests against a diagnosis of cardiac amyloidosis.  ECV = 22%, normal.  RIGHT VENTRICLE:  Normal right ventricular size, thickness and systolic function (RVEF 0000000). There are no regional wall motion abnormalities.  ATRIA:  Mild left atrial  enlargement.  Normal right atrial chamber size.  VALVES:  Mild-moderate mitral valve regurgitation. Due to systolic anterior motion by flow dephasing.  Trivial  aortic valve regurgitation by flow dephasing.  PERICARDIUM:  Normal pericardium.  Trace circumferential pericardial effusion.  OTHER: No significant extracardiac findings noted.  MEASUREMENTS:  LVEDV: 174 mL  LVESV: 83 mL  SV: 91 mL  CO: 5.5 L/min  Myocardial mass: 160 g  RVEDV: 121 mL  RVEDS: 49 mL  RVSV: 72 mL  IMPRESSION: 1.  Normal left ventricular size, LVEF 52%.  2. Akinesis of the basal-mid inferolateral wall with scar and wall thinning. Hypokinesis of the basal-apical anterior wall. Subendocardial delayed enhancement that is <50% thickness of the myocardium, suggestive of ischemic heart disease with viability in these regions.  3. Hypertrophic cardiomyopathy, sigmoid subtype. 18 mm basal anteroseptum.  4. Left ventricular outflow tract obstruction with systolic anterior motion of the mitral valve and qualitatively mild-moderate mitral valve regurgitation.  5. Mild myocardial enhancement in the RV insertion points at the base. No significant delayed myocardial enhancement associated with area of myocardial thickening.  6. No evidence of cardiac amyloidosis. Normal extracellular volume, 22%.  7.  Normal right ventricular size and function, RVEF 60%  8.  Trace pericardial effusion.   Electronically Signed   By: Cherlynn Kaiser   On: 02/29/2020 18:43    ASSESSMENT AND PLAN:   1.Hypertrophic CM:  euvolemic on exam.  Most recent Echo in August showed no significant LVOT gradient. Discussed screening of family members. MRI showed HCM with sigmoid septum 18 mm. Late gadolinium enhancement. He has NSVT noted on monitor. Referred to EP for consideration of ICD. Patient has deferred at this time but would consider if he had problems later.   2.  CAD: s/p prior  LAD, D1, and LCX PCI with STEMI in 2013. Admitted in June 2018 with in stent restenosis of the proximal LAD. Symptoms predominantly of DOE. Treated with cutting balloon PCI. Interestedly his Myoview study prior was normal so may not be a reliable study.  Continue ASA and Coreg. He is asymptomatic.   3.  HL:   intolerant to statins and zetia. On CLEAR trial now. Last LDL 138.   4.  Hypertension:  BP is well controlled.   .     Current medicines are reviewed at length with the patient today.  The patient does not have concerns regarding medicines.  The following changes have been made:  no change  Labs/ tests ordered today include:  No orders of the defined types were placed in this encounter.    Disposition:   FU with me in 6 months  Signed, Eloni Darius Martinique, MD  03/31/2020 12:09 PM    [facility] [address] [place], [place], [address] Phone [phone], Fax [phone]

## 2020-03-23 NOTE — Patient Instructions (Addendum)
Medication Instructions:  Your physician recommends that you continue on your current medications as directed. Please refer to the Current Medication list given to you today.  Labwork: You will get lab work today:  BMP and CBC  Testing/Procedures: Your physician has recommended that you have a defibrillator inserted. An implantable cardioverter defibrillator (ICD) is a small device that is placed in your chest or, in rare cases, your abdomen. This device uses electrical pulses or shocks to help control life-threatening, irregular heartbeats that could lead the heart to suddenly stop beating (sudden cardiac arrest). Leads are attached to the ICD that goes into your heart. This is done in the hospital and usually requires an overnight stay. Please see the instruction sheet given to you today for more information.  Follow-Up:  SEE INSTRUCTION LETTER  Any Other Special Instructions Will Be Listed Below (If Applicable).  If you need a refill on your cardiac medications before your next appointment, please call your pharmacy.    Cardioverter Defibrillator Implantation  An implantable cardioverter defibrillator (ICD) is a small device that is placed under the skin in the chest or abdomen. An ICD consists of a battery, a small computer (pulse generator), and wires (leads) that go into the heart. An ICD is used to detect and correct two types of dangerous irregular heartbeats (arrhythmias):  A rapid heart rhythm (tachycardia).  An arrhythmia in which the lower chambers of the heart (ventricles) contract in an uncoordinated way (fibrillation). When an ICD detects tachycardia, it sends a low-energy shock to the heart to restore the heartbeat to normal (cardioversion). This signal is usually painless. If cardioversion does not work or if the ICD detects fibrillation, it delivers a high-energy shock to the heart (defibrillation) to restart the heart. This shock may feel like a strong jolt in the  chest. Your health care provider may prescribe an ICD if:  You have had an arrhythmia that originated in the ventricles.  Your heart has been damaged by a disease or heart condition. Sometimes, ICDs are programmed to act as a device called a pacemaker. Pacemakers can be used to treat a slow heartbeat (bradycardia) or tachycardia by taking over the heart rate with electrical impulses. Tell a health care provider about:  Any allergies you have.  All medicines you are taking, including vitamins, herbs, eye drops, creams, and over-the-counter medicines.  Any problems you or family members have had with anesthetic medicines.  Any blood disorders you have.  Any surgeries you have had.  Any medical conditions you have.  Whether you are pregnant or may be pregnant. What are the risks? Generally, this is a safe procedure. However, problems may occur, including:  Swelling, bleeding, or bruising.  Infection.  Blood clots.  Damage to other structures or organs, such as nerves, blood vessels, or the heart.  Allergic reactions to medicines used during the procedure. What happens before the procedure? Staying hydrated Follow instructions from your health care provider about hydration, which may include:  Up to 2 hours before the procedure - you may continue to drink clear liquids, such as water, clear fruit juice, black coffee, and plain tea. Eating and drinking restrictions Follow instructions from your health care provider about eating and drinking, which may include:  8 hours before the procedure - stop eating heavy meals or foods such as meat, fried foods, or fatty foods.  6 hours before the procedure - stop eating light meals or foods, such as toast or cereal.  6 hours before the procedure -  stop drinking milk or drinks that contain milk.  2 hours before the procedure - stop drinking clear liquids. Medicine Ask your health care provider about:  Changing or stopping your  normal medicines. This is important if you take diabetes medicines or blood thinners.  Taking medicines such as aspirin and ibuprofen. These medicines can thin your blood. Do not take these medicines before your procedure if your doctor tells you not to. Tests  You may have blood tests.  You may have a test to check the electrical signals in your heart (electrocardiogram, ECG).  You may have imaging tests, such as a chest X-ray. General instructions  For 24 hours before the procedure, stop using products that contain nicotine or tobacco, such as cigarettes and e-cigarettes. If you need help quitting, ask your health care provider.  Plan to have someone take you home from the hospital or clinic.  You may be asked to shower with a germ-killing soap. What happens during the procedure?  To reduce your risk of infection: ? Your health care team will wash or sanitize their hands. ? Your skin will be washed with soap. ? Hair may be removed from the surgical area.  Small monitors will be put on your body. They will be used to check your heart, blood pressure, and oxygen level.  An IV tube will be inserted into one of your veins.  You will be given one or more of the following: ? A medicine to help you relax (sedative). ? A medicine to numb the area (local anesthetic). ? A medicine to make you fall asleep (general anesthetic).  Leads will be guided through a blood vessel into your heart and attached to your heart muscles. Depending on the ICD, the leads may go into one ventricle or they may go into both ventricles and into an upper chamber of the heart. An X-ray machine (fluoroscope) will be usedto help guide the leads.  A small incision will be made to create a deep pocket under your skin.  The pulse generator will be placed into the pocket.  The ICD will be tested.  The incision will be closed with stitches (sutures), skin glue, or staples.  A bandage (dressing) will be placed  over the incision. This procedure may vary among health care providers and hospitals. What happens after the procedure?  Your blood pressure, heart rate, breathing rate, and blood oxygen level will be monitored often until the medicines you were given have worn off.  A chest X-ray will be taken to check that the ICD is in the right place.  You will need to stay in the hospital for 1-2 days so your health care provider can make sure your ICD is working.  Do not drive for 24 hours if you received a sedative. Ask your health care provider when it is safe for you to drive.  You may be given an identification card explaining that you have an ICD. Summary  An implantable cardioverter defibrillator (ICD) is a small device that is placed under the skin in the chest or abdomen. It is used to detect and correct dangerous irregular heartbeats (arrhythmias).  An ICD consists of a battery, a small computer (pulse generator), and wires (leads) that go into the heart.  When an ICD detects rapid heart rhythm (tachycardia), it sends a low-energy shock to the heart to restore the heartbeat to normal (cardioversion). If cardioversion does not work or if the ICD detects uncoordinated heart contractions (fibrillation), it delivers a  high-energy shock to the heart (defibrillation) to restart the heart.  You will need to stay in the hospital for 1-2 days to make sure your ICD is working. This information is not intended to replace advice given to you by your health care provider. Make sure you discuss any questions you have with your health care provider. Document Revised: 11/02/2017 Document Reviewed: 11/29/2016 Elsevier Patient Education  2020 [employer].

## 2020-03-31 ENCOUNTER — Other Ambulatory Visit: Payer: Self-pay

## 2020-03-31 ENCOUNTER — Encounter: Payer: Self-pay | Admitting: Cardiology

## 2020-03-31 ENCOUNTER — Ambulatory Visit: Payer: Medicare HMO | Admitting: Cardiology

## 2020-03-31 VITALS — BP 128/80 | HR 76 | Temp 97.3°F | Ht 68.5 in | Wt 173.8 lb

## 2020-03-31 DIAGNOSIS — I422 Other hypertrophic cardiomyopathy: Secondary | ICD-10-CM

## 2020-03-31 DIAGNOSIS — I10 Essential (primary) hypertension: Secondary | ICD-10-CM

## 2020-03-31 DIAGNOSIS — I472 Ventricular tachycardia: Secondary | ICD-10-CM

## 2020-03-31 DIAGNOSIS — E785 Hyperlipidemia, unspecified: Secondary | ICD-10-CM

## 2020-03-31 DIAGNOSIS — I2510 Atherosclerotic heart disease of native coronary artery without angina pectoris: Secondary | ICD-10-CM | POA: Diagnosis not present

## 2020-03-31 DIAGNOSIS — Z9861 Coronary angioplasty status: Secondary | ICD-10-CM

## 2020-03-31 DIAGNOSIS — G720 Drug-induced myopathy: Secondary | ICD-10-CM

## 2020-03-31 DIAGNOSIS — T466X5A Adverse effect of antihyperlipidemic and antiarteriosclerotic drugs, initial encounter: Secondary | ICD-10-CM

## 2020-03-31 DIAGNOSIS — I4729 Other ventricular tachycardia: Secondary | ICD-10-CM

## 2020-04-05 ENCOUNTER — Other Ambulatory Visit (HOSPITAL_COMMUNITY): Payer: Medicare HMO

## 2020-04-07 ENCOUNTER — Ambulatory Visit (HOSPITAL_COMMUNITY): Admission: RE | Admit: 2020-04-07 | Payer: Medicare HMO | Source: Home / Self Care | Admitting: Internal Medicine

## 2020-04-07 ENCOUNTER — Encounter (HOSPITAL_COMMUNITY): Admission: RE | Payer: Self-pay | Source: Home / Self Care

## 2020-04-07 SURGERY — ICD IMPLANT

## 2020-04-07 NOTE — Progress Notes (Signed)
Patient hasn't arrived for procedure today.  Called patient he states he recently had a visit with Dr Martinique and they decided against the ICD.  Will notify Dr Lovena Le.

## 2020-04-08 ENCOUNTER — Other Ambulatory Visit: Payer: Self-pay | Admitting: Cardiology

## 2020-04-20 ENCOUNTER — Ambulatory Visit: Payer: Medicare HMO

## 2020-05-18 ENCOUNTER — Other Ambulatory Visit: Payer: Self-pay

## 2020-05-18 ENCOUNTER — Encounter: Payer: Medicare HMO | Admitting: *Deleted

## 2020-05-18 VITALS — BP 153/83 | HR 64 | Wt 170.0 lb

## 2020-05-18 DIAGNOSIS — Z006 Encounter for examination for normal comparison and control in clinical research program: Secondary | ICD-10-CM

## 2020-05-18 NOTE — Research (Signed)
Subject to research clinic for visit Clear [id number].  No cos,ae,or saes to report. Pt only brought one bottle back and stated that he thinks he threw the empty bottle in the trash.  Subject 96% compliant with meds and new drug dispensed.  Next phone call and clinic visit scheduled.

## 2020-05-25 DIAGNOSIS — D692 Other nonthrombocytopenic purpura: Secondary | ICD-10-CM | POA: Diagnosis not present

## 2020-05-25 DIAGNOSIS — L821 Other seborrheic keratosis: Secondary | ICD-10-CM | POA: Diagnosis not present

## 2020-05-25 DIAGNOSIS — Z85820 Personal history of malignant melanoma of skin: Secondary | ICD-10-CM | POA: Diagnosis not present

## 2020-05-25 DIAGNOSIS — L570 Actinic keratosis: Secondary | ICD-10-CM | POA: Diagnosis not present

## 2020-05-25 DIAGNOSIS — Z85828 Personal history of other malignant neoplasm of skin: Secondary | ICD-10-CM | POA: Diagnosis not present

## 2020-05-25 DIAGNOSIS — L812 Freckles: Secondary | ICD-10-CM | POA: Diagnosis not present

## 2020-07-20 ENCOUNTER — Ambulatory Visit: Payer: Medicare HMO | Admitting: Internal Medicine

## 2020-07-20 ENCOUNTER — Telehealth: Payer: Self-pay | Admitting: Cardiology

## 2020-07-20 NOTE — Telephone Encounter (Signed)
Left detailed message for patient appointment will be cancelled.

## 2020-07-20 NOTE — Telephone Encounter (Signed)
Spoke to patient Dr.Jordan's advice given.Appointment with Dr.Taylor cancelled.Follow up appointment scheduled with Dr.Jordan 10/11 at 2:00 pm.

## 2020-07-20 NOTE — Telephone Encounter (Signed)
No he can cancel that appointment. If something changes and we need him to see Dr Lovena Le in the future we could reschedule.  Vikkie Goeden Martinique MD, [facility]

## 2020-07-20 NOTE — Telephone Encounter (Signed)
    Pt wanted to ask Dr. Martinique if he still need to continue to see Dr. Lovena Le since he cancelled his procedure

## 2020-07-26 ENCOUNTER — Encounter: Payer: Self-pay | Admitting: Emergency Medicine

## 2020-07-26 ENCOUNTER — Ambulatory Visit
Admission: EM | Admit: 2020-07-26 | Discharge: 2020-07-26 | Disposition: A | Discharge status: Home / Self Care | Payer: Medicare HMO | Attending: Family Medicine | Admitting: Family Medicine

## 2020-07-26 ENCOUNTER — Other Ambulatory Visit: Payer: Self-pay

## 2020-07-26 DIAGNOSIS — Z760 Encounter for issue of repeat prescription: Secondary | ICD-10-CM

## 2020-07-26 DIAGNOSIS — B001 Herpesviral vesicular dermatitis: Secondary | ICD-10-CM

## 2020-07-26 MED ORDER — VALACYCLOVIR HCL 500 MG PO TABS: 500.0000 mg | ORAL_TABLET | Freq: Every day | ORAL | 1 refills | Status: DC | PRN

## 2020-07-26 MED ORDER — VALACYCLOVIR HCL 500 MG PO TABS: 500.0000 mg | ORAL_TABLET | Freq: Every day | ORAL | 0 refills | Status: DC | PRN

## 2020-07-26 NOTE — ED Triage Notes (Signed)
Pt here for valtrex for fever blister with hx of same

## 2020-07-27 NOTE — ED Provider Notes (Signed)
[facility]    CSN: [id number] Arrival date & time: 07/26/20  1304      History   Chief Complaint Chief Complaint  Patient presents with  . Medication Refill    HPI Brandon Gonzales is a 69 y.o. male.   Patient is a 69 year old male that presents today for medication refill he needs refill on the Valtrex for HSV 1.  Has chronic fever blisters.     Past Medical History:  Diagnosis Date  . Arthritis   . Cancer (HCC)    SKIN CA  SQUAMOUS CELL  . Coronary artery disease    a. 2007 Stent to LAD, PTCA D1;  b. DES to LAD & LCx 10/13 in the setting of STEMI.  [name] Kitchen Dyslipidemia    a. Statin and zetia-intolerant. On niacin.  [name] Kitchen GERD (gastroesophageal reflux disease)   . Heart murmur   . Hypertension   . Hypertrophic cardiomyopathy ([place])    a. 09/2012 Echo: EF 55%, mid to apical anterolateral, posterior, apical anterior, apical HK, Gr1 DD, turbulence across LVOT suggesting degree of obstruction, mild MR, mildly dil LA.    Patient Active Problem List   Diagnosis Date Noted  . Near syncope 12/17/2019  . Statin myopathy 11/13/2019  . Herpes labialis 01/31/2018  . Healthcare maintenance 01/31/2018  . Essential hypertension   . Heart murmur   . GERD (gastroesophageal reflux disease)   . CAD S/P percutaneous coronary angioplasty   . Cancer ([place])   . Angina at rest [facility]) 05/17/2017  . Hypertrophic cardiomyopathy ([place])   . S/P PTCA (percutaneous transluminal coronary angioplasty)   . Chest pain 05/16/2017  . Unstable angina ([place])   . Dyspnea on exertion 02/21/2017  . Neck pain 12/28/2016  . NSVT (nonsustained ventricular tachycardia) ([place]) 09/25/2012  . Hypertrophic obstructive cardiomyopathy(425.11) 09/25/2012  . CKD (chronic kidney disease) stage 3, GFR 30-59 ml/min 09/25/2012  . ST elevation myocardial infarction (STEMI) of lateral wall ([place]) 09/22/2012  . CAD (coronary artery disease) 01/11/2012  . Dyslipidemia   . H/O gastroesophageal reflux (GERD)   .  Arthritis     Past Surgical History:  Procedure Laterality Date  . CARDIAC CATHETERIZATION  2007   stent to LAD and PTCA of diagonal branch  . CARDIAC CATHETERIZATION  2010  . CORONARY ANGIOPLASTY WITH STENT PLACEMENT  2013   95-99% prox LAD, patent LAD stent distal to this, occluded mid-dist LCx, mild <10% RCA irreg; s/p DES-prox LAD & DES to mid-dist LCx  . CORONARY BALLOON ANGIOPLASTY N/A 05/16/2017   Procedure: Coronary Balloon Angioplasty;  Surgeon: Leonie Man, MD;  Location: [facility];  Service: Cardiovascular;  Laterality: N/A;  . [place]   right  . [place]   left  . LEFT HEART CATH Bilateral 09/22/2012   Procedure: LEFT HEART CATH;  Surgeon: Peter M Martinique, MD;  Location: [facility] CATH LAB;  Service: Cardiovascular;  Laterality: Bilateral;  . LEFT HEART CATH AND CORONARY ANGIOGRAPHY N/A 05/16/2017   Procedure: Left Heart Cath and Coronary Angiography;  Surgeon: Leonie Man, MD;  Location: [facility];  Service: Cardiovascular;  Laterality: N/A;  . PERCUTANEOUS CORONARY STENT INTERVENTION (PCI-S) Right 09/22/2012   Procedure: PERCUTANEOUS CORONARY STENT INTERVENTION (PCI-S);  Surgeon: Peter M Martinique, MD;  Location: [facility] CATH LAB;  Service: Cardiovascular;  Laterality: Right;       Home Medications    Prior to Admission medications   Medication Sig Start Date End Date Taking?  Authorizing Provider  AMBULATORY NON FORMULARY MEDICATION Take 180 mg by mouth daily. Medication Name: bempedoic acid vs a placebo CLEAR Research Study drug provided 05/02/17   Lelon Perla, MD  amLODipine (NORVASC) 2.5 MG tablet TAKE 1 TABLET BY MOUTH EVERY DAY Patient taking differently: Take 2.5 mg by mouth daily.  03/15/20   Martinique, Peter M, MD  aspirin EC 81 MG tablet Take 81 mg by mouth daily.    [provider]  calcium carbonate (TUMS EX) 750 MG chewable tablet Chew 2 tablets by mouth daily as needed for heartburn.    [provider]    carvedilol (COREG) 12.5 MG tablet TAKE 0.5 TABLETS (6.25 MG TOTAL) BY MOUTH 2 (TWO) TIMES DAILY. 12/24/19   Martinique, Peter M, MD  esomeprazole (NEXIUM) 20 MG capsule Take 20 mg by mouth every morning.    [provider]  Inositol Niacinate (NIACIN FLUSH FREE) 500 MG CAPS Take 1,000 mg by mouth daily.    [provider]  lisinopril (ZESTRIL) 40 MG tablet Take 1 tablet (40 mg total) by mouth daily. 02/03/20   Martinique, Peter M, MD  nitroGLYCERIN (NITROSTAT) 0.4 MG SL tablet Place 1 tablet (0.4 mg total) under the tongue every 5 (five) minutes as needed for chest pain. 01/13/19 01/07/20  Martinique, Peter M, MD  valACYclovir (VALTREX) 500 MG tablet Take 1 tablet (500 mg total) by mouth daily as needed (fever blisters). 07/26/20   Orvan July, NP    Family History Family History  Problem Relation Age of Onset  . Heart attack Mother 64  . Heart disease Mother     Social History Social History   Tobacco Use  . Smoking status: Never Smoker  . Smokeless tobacco: Never Used  Vaping Use  . Vaping Use: Never used  Substance Use Topics  . Alcohol use: No  . Drug use: No     Allergies   Antihistamines, chlorpheniramine-type; Pheniramine; Statins; and Zetia [ezetimibe]   Review of Systems Review of Systems   Physical Exam Triage Vital Signs ED Triage Vitals  Enc Vitals Group     BP 07/26/20 1314 (!) 175/84     Pulse Rate 07/26/20 1314 63     Resp 07/26/20 1314 18     Temp 07/26/20 1314 98.1 F (36.7 C)     Temp Source 07/26/20 1314 Oral     SpO2 07/26/20 1314 98 %     Weight --      Height --      Head Circumference --      Peak Flow --      Pain Score 07/26/20 1315 0     Pain Loc --      Pain Edu? --      Excl. in [place]? --    No data found.  Updated Vital Signs BP (!) 175/84 (BP Location: Right Arm)   Pulse 63   Temp 98.1 F (36.7 C) (Oral)   Resp 18   SpO2 98%   Visual Acuity Right Eye Distance:   Left Eye Distance:   Bilateral Distance:    Right Eye  Near:   Left Eye Near:    Bilateral Near:     Physical Exam Vitals and nursing note reviewed.  Constitutional:      Appearance: Normal appearance.  HENT:     Head: Normocephalic and atraumatic.     Mouth/Throat:   Eyes:     Conjunctiva/sclera: Conjunctivae normal.  Pulmonary:     Effort:  Pulmonary effort is normal.  Musculoskeletal:        General: Normal range of motion.     Cervical back: Normal range of motion.  Skin:    General: Skin is warm and dry.  Neurological:     Mental Status: He is alert.  Psychiatric:        Mood and Affect: Mood normal.      UC Treatments / Results  Labs (all labs ordered are listed, but only abnormal results are displayed) Labs Reviewed - No data to display  EKG   Radiology No results found.  Procedures Procedures (including critical care time)  Medications Ordered in UC Medications - No data to display  Initial Impression / Assessment and Plan / UC Course  I have reviewed the triage vital signs and the nursing notes.  Pertinent labs & imaging results that were available during my care of the patient were reviewed by me and considered in my medical decision making (see chart for details).     Cold sore Refill Valtrex Follow-up with primary care as needed Final Clinical Impressions(s) / UC Diagnoses   Final diagnoses:  Cold sore  Encounter for medication refill   Discharge Instructions   None    ED Prescriptions    Medication Sig Dispense Auth. Provider   valACYclovir (VALTREX) 500 MG tablet  (Status: Discontinued) Take 1 tablet (500 mg total) by mouth daily as needed (fever blisters). 10 tablet Laakea Pereira A, NP   valACYclovir (VALTREX) 500 MG tablet Take 1 tablet (500 mg total) by mouth daily as needed (fever blisters). 28 tablet Toma Arts A, NP     PDMP not reviewed this encounter.   Orvan July, NP 07/27/20 1028

## 2020-08-25 ENCOUNTER — Telehealth: Payer: Self-pay | Admitting: *Deleted

## 2020-08-25 NOTE — Telephone Encounter (Signed)
Talked with Brandon Gonzales today for his Treatment 36, Month 61 phone call for the CLEAR research study. All concomitant medications have been reviewed and updated. All AE's/SAE's have been reviewed and reported to sponsor. Subject did voice concerns for believing that they on placebo due to MD checking his lipids. Subject stated that they feel like they need to do research and discuss this further with their doctors to determine if they would like to continue on study drug. I did talk with him about how we really encourage patients to stay in the study even if they believe they're on placebo and how if their doctor believes they need to be started on another medication that targets lowering cholesterol that it is ok but staying on IP still helps [place] with our research on the IP medication for data. I did however tell the patient that it's ultimately up to them to decide wether they would like to stay on IP or not. Subject said they will let me know what they're thoughts are after going to cardiologist appointment coming up. Currently subject is still taking study medication.  Next appointment is scheduled for December 15 th, 2021 at 10:00.

## 2020-09-09 NOTE — Progress Notes (Signed)
Cardiology Office Note   Date:  09/13/2020   ID:  AADIN GAUT, DOB 01/20/1951, MRN [id number]  PCP:  [name], NP  Cardiologist:   Kenyata Napier Martinique, MD   Chief Complaint  Patient presents with  . Coronary Artery Disease  . Cardiomyopathy      History of Present Illness: Brandon Gonzales is a 69 y.o. male who presents for follow up CAD and HCM. He has a PMH including CAD prior mid LAD stenting in 2007 with  PTCA to D1, followed by lateral STEMI and PCI/DES to the midLCX & proximalLAD in 2013. S/p cutting balloon PTCA of the LAD in June 2019 for in stent restenosis.  He also has a h/o HTN, HL, GERD, and hypertrophic cardiomyopathy.    He called on 02/19/17 with complaints of dyspnea and dizziness that he states were similar to when he had his MI. He felt very flushed in his face. He was referred to the ED.  BP recorded by EMS was 210/107. Came down to 159/87 in ED.  Ecg showed NSR with a RBBB- new in January 2018. No acute change. Delta troponin was normal. UA, BMET, BNP were all normal. He was discharged on amlodipine 5 mg daily.   He was evaluated with Echo in April 2018 showing moderate LVH with severe basal septal hypertrophy. No significant outflow gradient. Mild MR. Myoview study showed a small area of scar at the base of the lateral wall. No ischemia. EF 51%. He presented in June 2018 with symptoms of worsening chest pain and dyspnea on exertion. He underwent cardiac cath showing 95% in stent restenosis of the proximal LAD treated with cutting  balloon angioplasty. There was a 65% mid RCA stenosis and 50% in the mid LCx. He was enrolled in the CLEAR trial for hyperlipidemia given history of statin and Zetia intolerance.   The patient was seen in the office 12/17/2019 after an episode of near syncope a couple on New Year's Day. They had just eaten and he was in his car with his daughter (who is a paramedic) driving a plate of food over to his brother's house. While driving he  became weak and near syncopal. he denied any diaphoresis, shortness of breath, or nausea and vomiting. His daughter took his pulse and thought he might be in atrial fibrillation but if it was present it did not last long. His symptoms resolved quickly. He did have a recurrent episode the following 2 days but none since.  He did have COVIDback in October.  A 7 day ZIO monitor was done and showed episodic bradycardia down to the 40"s- usually early am.  He also had frequent PVCs and short runs of PSVT.  In addition he had runs of NSWCT that appear to be NSVT. The longest was 14 beats.  He has had no further episodes of near syncope or symptomatic tachycardia.  He denies chest pain or unusual dyspnea. Cardiac MRI was ordered with results noted below. Given syncopal episode, wide complex tachycardia noted on monitor, HCM with late gadolinium uptake on MRI and relatively low EF 52% I recommended EP evaluation to see if ICD indicated. Seen by Dr Lovena Le on 03/23/20. ICD recommended patient deferred.   On follow up today patient feels great. No chest pain, dyspnea, palpitations or dizziness. No further syncope. He wants to have his cholesterol rechecked. Has been in the CLEAR study but thinks he is getting placebo and is interested in possibly going on a PSCK  9 inhibitor.  Past Medical History:  Diagnosis Date  . Arthritis   . Cancer (HCC)    SKIN CA  SQUAMOUS CELL  . Coronary artery disease    a. 2007 Stent to LAD, PTCA D1;  b. DES to LAD & LCx 10/13 in the setting of STEMI.  [name] Kitchen Dyslipidemia    a. Statin and zetia-intolerant. On niacin.  [name] Kitchen GERD (gastroesophageal reflux disease)   . Heart murmur   . Hypertension   . Hypertrophic cardiomyopathy ([place])    a. 09/2012 Echo: EF 55%, mid to apical anterolateral, posterior, apical anterior, apical HK, Gr1 DD, turbulence across LVOT suggesting degree of obstruction, mild MR, mildly dil LA.    Past Surgical History:  Procedure Laterality Date  . CARDIAC  CATHETERIZATION  2007   stent to LAD and PTCA of diagonal branch  . CARDIAC CATHETERIZATION  2010  . CORONARY ANGIOPLASTY WITH STENT PLACEMENT  2013   95-99% prox LAD, patent LAD stent distal to this, occluded mid-dist LCx, mild <10% RCA irreg; s/p DES-prox LAD & DES to mid-dist LCx  . CORONARY BALLOON ANGIOPLASTY N/A 05/16/2017   Procedure: Coronary Balloon Angioplasty;  Surgeon: Leonie Man, MD;  Location: [place] [facility];  Service: Cardiovascular;  Laterality: N/A;  . [place]   right  . [place]   left  . LEFT HEART CATH Bilateral 09/22/2012   Procedure: LEFT HEART CATH;  Surgeon: Jahel Wavra M Martinique, MD;  Location: [facility] CATH LAB;  Service: Cardiovascular;  Laterality: Bilateral;  . LEFT HEART CATH AND CORONARY ANGIOGRAPHY N/A 05/16/2017   Procedure: Left Heart Cath and Coronary Angiography;  Surgeon: Leonie Man, MD;  Location: [place] [facility];  Service: Cardiovascular;  Laterality: N/A;  . PERCUTANEOUS CORONARY STENT INTERVENTION (PCI-S) Right 09/22/2012   Procedure: PERCUTANEOUS CORONARY STENT INTERVENTION (PCI-S);  Surgeon: Milind Raether M Martinique, MD;  Location: [facility] CATH LAB;  Service: Cardiovascular;  Laterality: Right;     Current Outpatient Medications  Medication Sig Dispense Refill  . AMBULATORY NON FORMULARY MEDICATION Take 180 mg by mouth daily. Medication Name: bempedoic acid vs a placebo CLEAR Research Study drug provided    . amLODipine (NORVASC) 2.5 MG tablet TAKE 1 TABLET BY MOUTH EVERY DAY (Patient taking differently: Take 2.5 mg by mouth daily. ) 90 tablet 2  . aspirin EC 81 MG tablet Take 81 mg by mouth daily.    . calcium carbonate (TUMS EX) 750 MG chewable tablet Chew 2 tablets by mouth daily as needed for heartburn.    . carvedilol (COREG) 12.5 MG tablet TAKE 0.5 TABLETS (6.25 MG TOTAL) BY MOUTH 2 (TWO) TIMES DAILY. 90 tablet 3  . esomeprazole (NEXIUM) 20 MG capsule Take 20 mg by mouth every morning.    . Inositol Niacinate (NIACIN FLUSH FREE)  500 MG CAPS Take 1,000 mg by mouth daily.    [name] Kitchen lisinopril (ZESTRIL) 40 MG tablet Take 1 tablet (40 mg total) by mouth daily. 90 tablet 3  . nitroGLYCERIN (NITROSTAT) 0.4 MG SL tablet Place 1 tablet (0.4 mg total) under the tongue every 5 (five) minutes as needed for chest pain. 25 tablet 3  . valACYclovir (VALTREX) 500 MG tablet Take 1 tablet (500 mg total) by mouth daily as needed (fever blisters). 28 tablet 1   No current facility-administered medications for this visit.    Allergies:   Antihistamines, chlorpheniramine-type; Pheniramine; Statins; and Zetia [ezetimibe]    Social History:  The patient  reports that he has  never smoked. He has never used smokeless tobacco. He reports that he does not drink alcohol and does not use drugs.   Family History:  The patient's family history includes Heart attack (age of onset: 48) in his mother; Heart disease in his mother.    ROS:  Please see the history of present illness.   Otherwise, review of systems are positive for none.   All other systems are reviewed and negative.    PHYSICAL EXAM: VS:  BP 138/82   Pulse 70   Ht 5' 8.5" (1.74 m)   Wt 172 lb 9.6 oz (78.3 kg)   SpO2 98%   BMI 25.86 kg/m  , BMI Body mass index is 25.86 kg/m. GEN: Well nourished, well developed, in no acute distress  HEENT: normal  Neck: no JVD, carotid bruits, or masses Cardiac: RRR;  rubs, or gallops,no edema. Harsh gr 6-4/3 systolic murmur LSB and apex. Respiratory:  clear to auscultation bilaterally, normal work of breathing GI: soft, nontender, nondistended, + BS MS: no deformity or atrophy  Skin: warm and dry, no rash Neuro:  Strength and sensation are intact Psych: euthymic mood, full affect   EKG:  EKG is not ordered today. The ekg ordered today demonstrates N/A   Recent Labs: 03/23/2020: BUN 23; Creatinine, Ser 0.96; Hemoglobin 15.9; Platelets 153; Potassium 4.5; Sodium 141    Lipid Panel    Component Value Date/Time   CHOL 195 09/10/2019  1021   TRIG 145 09/10/2019 1021   HDL 31 (L) 09/10/2019 1021   CHOLHDL 6.3 (H) 09/10/2019 1021   CHOLHDL 5.8 05/16/2017 0337   VLDL 54 (H) 05/16/2017 0337   LDLCALC 138 (H) 09/10/2019 1021   LDLDIRECT 159.5 10/01/2012 0847      Wt Readings from Last 3 Encounters:  09/13/20 172 lb 9.6 oz (78.3 kg)  05/18/20 170 lb (77.1 kg)  03/31/20 173 lb 12.8 oz (78.8 kg)      Other studies Reviewed: Additional studies/ records that were reviewed today include:   Cardiac cath 05/16/17:  Coronary Balloon Angioplasty  Left Heart Cath and Coronary Angiography  Conclusion    Ost RCA lesion, 40 %stenosed. Mid RCA lesion, 65 %stenosed. Borderline significant.  The left ventricular systolic function is normal.  LV end diastolic pressure is mildly elevated.  There is no mitral valve regurgitation.  There is no aortic valve stenosis.  Prox LAD to Mid LAD bare-metal stent, 20 %stenosed.  Mid LAD lesion, 40 %stenosed. Localized segment of previous bare-metal stent-  2nd Mrg DES stent, 0 %stenosed.  Prox Cx to Mid Cx lesion, 40 %stenosed.  Ost LAD lesion, 95 %stenosed.  Post intervention, there is a 10% residual stenosis.   Mr. Kirn has several potential culprit lesions, however the most obvious one in the LAD with 95% stenosis. This is in-stent restenosis, therefore treated with balloon angioplasty using the [facility] Cutting Balloon followed by post-dilation with a noncompliant balloon.  The RCA lesion is of borderline significance, but does not appear to be acute in nature. Would defer intervention on this vessel at this time unless she has ongoing or worsening symptoms.   Plan: Overnight monitoring and likely discharge tomorrow Return to nursing unit for ongoing care and TR been removal. Back on dual antiplatelet therapy.- Reduced aspirin 81 mg him and using Brilinta. Further risk factor modification per rounding team.   He can follow-up with Dr. Martinique, will need to have  a follow-up appointment rescheduled as he was post be there tomorrow June 14.  Glenetta Hew, M.D., M.S. Interventional Cardiologist      Echo 07/14/19: IMPRESSIONS    1. The left ventricle has normal systolic function with an ejection  fraction of 60-65%. The cavity size was normal. Severe basal septal  hypertrophy and mild concentric hypertrophy. Left ventricular diastolic  Doppler parameters are consistent with  pseudonormalization. Elevated left ventricular end-diastolic pressure.  2. The average left ventricular global longitudinal strain is -16.0 %.  3. The right ventricle has normal systolic function. The cavity was  normal. There is no increase in right ventricular wall thickness.  4. Left atrial size was mildly dilated.  5. The aortic valve is tricuspid. Moderate sclerosis of the aortic valve.  Aortic valve regurgitation is mild by color flow Doppler.  6. The aorta is normal in size and structure.   Cardiac MRI 02/25/20: CLINICAL DATA:  Hypertrophic cardiomyopathy suspected, further testing  COMPARISON: Echocardiogram 07/14/2019  EXAM: CARDIAC MRI  TECHNIQUE: The patient was scanned on a 1.5 Tesla GE magnet. A dedicated cardiac coil was used. Functional imaging was done using Fiesta sequences. 2,3, and 4 chamber views were done to assess for RWMA's. Modified Simpson's rule using a short axis stack was used to calculate an ejection fraction on a dedicated work Conservation officer, nature. The patient received 22mL GADAVIST GADOBUTROL 1 MMOL/ML IV SOLN. After 10 minutes inversion recovery sequences were used to assess for infiltration and scar tissue.  CONTRAST:  84mL GADAVIST GADOBUTROL 1 MMOL/ML IV SOLN  FINDINGS: LEFT VENTRICLE:  Normal LV chamber size.  Wall thickness is increased in the basal septum as described below. Wall thinning in the anterior wall and inferolateral wall. The lateral wall demonstrates scar and is significantly  thinned.  Akinesis of the inferolateral wall from base to mid ventricle, and hypokinesis of lateral apex. Hypokinesis of the anterior wall from base to apex.  LVEF = 52%  Maximal wall thickness: 18 mm  Location: Basal anteroseptum  There is systolic anterior motion of the mitral valve. Flow dephasing suggests left ventricular outflow tract obstruction. Mitral regurgitation is seen, posteriorly directed, eccentric and qualitatively mild-moderate.  No evidence of left ventricular apical aneurysm.  Findings are consistent with hypertrophic cardiomyopathy with obstruction. Morphologic subtype: Sigmoid.  There is late gadolinium enhancement in the left ventricular myocardium.  LGE noted subendocardium of the inferolateral and the inferior portion of the anterolateral wall from base to mid ventricle, including a mildly aneurysmal appearing inferolateral mid ventricular segment. Delayed myocardial enhancement is <50% of the thickness of the lateral wall, suggesting viability in this coronary distribution.  There is also LGE in the subendocardium of the anterior wall from base to mid ventricle. Delayed myocardial enhancement is <50% of the thickness of the anterior wall, suggesting viability in this coronary distribution.  Mild delayed myocardial enhancement noted at the RV insertion points. This represents <1% of the myocardial mass. Quantitation of LGE not performed in the setting of prominent delayed gadolinium enhancement from ischemic heart disease.  Normal T1 myocardial nulling kinetics suggests against a diagnosis of cardiac amyloidosis.  ECV = 22%, normal.  RIGHT VENTRICLE:  Normal right ventricular size, thickness and systolic function (RVEF =73%). There are no regional wall motion abnormalities.  ATRIA:  Mild left atrial enlargement.  Normal right atrial chamber size.  VALVES:  Mild-moderate mitral valve regurgitation. Due to systolic  anterior motion by flow dephasing.  Trivial aortic valve regurgitation by flow dephasing.  PERICARDIUM:  Normal pericardium.  Trace circumferential pericardial effusion.  OTHER: No significant extracardiac findings  noted.  MEASUREMENTS:  LVEDV: 174 mL  LVESV: 83 mL  SV: 91 mL  CO: 5.5 L/min  Myocardial mass: 160 g  RVEDV: 121 mL  RVEDS: 49 mL  RVSV: 72 mL  IMPRESSION: 1.  Normal left ventricular size, LVEF 52%.  2. Akinesis of the basal-mid inferolateral wall with scar and wall thinning. Hypokinesis of the basal-apical anterior wall. Subendocardial delayed enhancement that is <50% thickness of the myocardium, suggestive of ischemic heart disease with viability in these regions.  3. Hypertrophic cardiomyopathy, sigmoid subtype. 18 mm basal anteroseptum.  4. Left ventricular outflow tract obstruction with systolic anterior motion of the mitral valve and qualitatively mild-moderate mitral valve regurgitation.  5. Mild myocardial enhancement in the RV insertion points at the base. No significant delayed myocardial enhancement associated with area of myocardial thickening.  6. No evidence of cardiac amyloidosis. Normal extracellular volume, 22%.  7.  Normal right ventricular size and function, RVEF 60%  8.  Trace pericardial effusion.   Electronically Signed   By: Cherlynn Kaiser   On: 02/29/2020 18:43    ASSESSMENT AND PLAN:   1.Hypertrophic CM:  euvolemic on exam.  Most recent Echo in August 2020 showed no significant LVOT gradient.  Late gadolinium enhancement. He has NSVT noted on monitor. Referred to EP for consideration of ICD. Patient has deferred at this time but would consider if he had problems later.   2.  CAD: s/p prior LAD, D1, and LCX PCI with STEMI in 2013. Admitted in June 2018 with in stent restenosis of the proximal LAD. Symptoms predominantly of DOE. Treated with cutting balloon PCI. Interestedly his Myoview  study prior was normal so may not be a reliable study.  Continue ASA and Coreg. He is asymptomatic.   3.  HL:   intolerant to statins and zetia. On CLEAR trial now. Last LDL 138. Will update labs. If LDL till elevated would recommend starting a PCSK 9 inhibitor.   4.  Hypertension:  BP is well controlled.   .     Current medicines are reviewed at length with the patient today.  The patient does not have concerns regarding medicines.  The following changes have been made:  no change  Labs/ tests ordered today include:  No orders of the defined types were placed in this encounter.    Disposition:   FU with me in 6 months  Signed, Jacquette Canales Martinique, MD  09/13/2020 2:08 PM    [facility] [address], [place], [place], [address] Phone [phone], Fax [phone]

## 2020-09-13 ENCOUNTER — Encounter: Payer: Self-pay | Admitting: Cardiology

## 2020-09-13 ENCOUNTER — Ambulatory Visit: Payer: Medicare HMO | Admitting: Cardiology

## 2020-09-13 ENCOUNTER — Other Ambulatory Visit: Payer: Self-pay

## 2020-09-13 VITALS — BP 138/82 | HR 70 | Ht 68.5 in | Wt 172.6 lb

## 2020-09-13 DIAGNOSIS — I422 Other hypertrophic cardiomyopathy: Secondary | ICD-10-CM | POA: Diagnosis not present

## 2020-09-13 DIAGNOSIS — I472 Ventricular tachycardia: Secondary | ICD-10-CM

## 2020-09-13 DIAGNOSIS — Z9861 Coronary angioplasty status: Secondary | ICD-10-CM

## 2020-09-13 DIAGNOSIS — I10 Essential (primary) hypertension: Secondary | ICD-10-CM

## 2020-09-13 DIAGNOSIS — E782 Mixed hyperlipidemia: Secondary | ICD-10-CM

## 2020-09-13 DIAGNOSIS — I2510 Atherosclerotic heart disease of native coronary artery without angina pectoris: Secondary | ICD-10-CM | POA: Diagnosis not present

## 2020-09-13 DIAGNOSIS — R55 Syncope and collapse: Secondary | ICD-10-CM | POA: Diagnosis not present

## 2020-09-13 DIAGNOSIS — I4729 Other ventricular tachycardia: Secondary | ICD-10-CM

## 2020-09-13 MED ORDER — NITROGLYCERIN 0.4 MG SL SUBL: 0.4000 mg | SUBLINGUAL_TABLET | SUBLINGUAL | 3 refills | Status: DC | PRN

## 2020-09-15 ENCOUNTER — Encounter (HOSPITAL_BASED_OUTPATIENT_CLINIC_OR_DEPARTMENT_OTHER): Payer: Self-pay | Admitting: Radiology

## 2020-09-15 ENCOUNTER — Other Ambulatory Visit: Payer: Self-pay

## 2020-09-15 ENCOUNTER — Observation Stay (HOSPITAL_BASED_OUTPATIENT_CLINIC_OR_DEPARTMENT_OTHER)
Admission: EM | Admit: 2020-09-15 | Discharge: 2020-09-16 | Disposition: A | Discharge status: Home / Self Care | Payer: Medicare HMO | Attending: Family Medicine | Admitting: Family Medicine

## 2020-09-15 ENCOUNTER — Ambulatory Visit
Admission: EM | Admit: 2020-09-15 | Discharge: 2020-09-15 | Disposition: A | Discharge status: Home / Self Care | Payer: Medicare HMO

## 2020-09-15 ENCOUNTER — Emergency Department (HOSPITAL_BASED_OUTPATIENT_CLINIC_OR_DEPARTMENT_OTHER): Payer: Medicare HMO

## 2020-09-15 ENCOUNTER — Inpatient Hospital Stay (HOSPITAL_COMMUNITY): Payer: Medicare HMO

## 2020-09-15 DIAGNOSIS — I639 Cerebral infarction, unspecified: Principal | ICD-10-CM | POA: Diagnosis present

## 2020-09-15 DIAGNOSIS — R202 Paresthesia of skin: Secondary | ICD-10-CM | POA: Diagnosis present

## 2020-09-15 DIAGNOSIS — Z9861 Coronary angioplasty status: Secondary | ICD-10-CM

## 2020-09-15 DIAGNOSIS — Z85828 Personal history of other malignant neoplasm of skin: Secondary | ICD-10-CM | POA: Diagnosis not present

## 2020-09-15 DIAGNOSIS — I422 Other hypertrophic cardiomyopathy: Secondary | ICD-10-CM

## 2020-09-15 DIAGNOSIS — Z7982 Long term (current) use of aspirin: Secondary | ICD-10-CM | POA: Insufficient documentation

## 2020-09-15 DIAGNOSIS — R29898 Other symptoms and signs involving the musculoskeletal system: Secondary | ICD-10-CM | POA: Diagnosis not present

## 2020-09-15 DIAGNOSIS — Z79899 Other long term (current) drug therapy: Secondary | ICD-10-CM | POA: Diagnosis not present

## 2020-09-15 DIAGNOSIS — E785 Hyperlipidemia, unspecified: Secondary | ICD-10-CM | POA: Diagnosis present

## 2020-09-15 DIAGNOSIS — I2510 Atherosclerotic heart disease of native coronary artery without angina pectoris: Secondary | ICD-10-CM

## 2020-09-15 DIAGNOSIS — R200 Anesthesia of skin: Secondary | ICD-10-CM | POA: Diagnosis not present

## 2020-09-15 DIAGNOSIS — I10 Essential (primary) hypertension: Secondary | ICD-10-CM | POA: Diagnosis not present

## 2020-09-15 DIAGNOSIS — Z20822 Contact with and (suspected) exposure to covid-19: Secondary | ICD-10-CM | POA: Insufficient documentation

## 2020-09-15 DIAGNOSIS — R531 Weakness: Secondary | ICD-10-CM | POA: Diagnosis not present

## 2020-09-15 LAB — RAPID URINE DRUG SCREEN, HOSP PERFORMED
Amphetamines: NOT DETECTED
Barbiturates: NOT DETECTED
Benzodiazepines: NOT DETECTED
Cocaine: NOT DETECTED
Opiates: NOT DETECTED
Tetrahydrocannabinol: NOT DETECTED

## 2020-09-15 LAB — DIFFERENTIAL
Abs Immature Granulocytes: 0.03 10*3/uL (ref 0.00–0.07)
Basophils Absolute: 0 10*3/uL (ref 0.0–0.1)
Basophils Relative: 0 %
Eosinophils Absolute: 0.2 10*3/uL (ref 0.0–0.5)
Eosinophils Relative: 3 %
Immature Granulocytes: 0 %
Lymphocytes Relative: 15 %
Lymphs Abs: 1 10*3/uL (ref 0.7–4.0)
Monocytes Absolute: 0.5 10*3/uL (ref 0.1–1.0)
Monocytes Relative: 7 %
Neutro Abs: 5 10*3/uL (ref 1.7–7.7)
Neutrophils Relative %: 75 %

## 2020-09-15 LAB — CBC
HCT: 49.1 % (ref 39.0–52.0)
Hemoglobin: 16.7 g/dL (ref 13.0–17.0)
MCH: 30.2 pg (ref 26.0–34.0)
MCHC: 34 g/dL (ref 30.0–36.0)
MCV: 88.8 fL (ref 80.0–100.0)
Platelets: 172 10*3/uL (ref 150–400)
RBC: 5.53 MIL/uL (ref 4.22–5.81)
RDW: 12.8 % (ref 11.5–15.5)
WBC: 6.7 10*3/uL (ref 4.0–10.5)
nRBC: 0 % (ref 0.0–0.2)

## 2020-09-15 LAB — APTT: aPTT: 32 seconds (ref 24–36)

## 2020-09-15 LAB — COMPREHENSIVE METABOLIC PANEL
ALT: 25 U/L (ref 0–44)
AST: 25 U/L (ref 15–41)
Albumin: 4.5 g/dL (ref 3.5–5.0)
Alkaline Phosphatase: 52 U/L (ref 38–126)
Anion gap: 9 (ref 5–15)
BUN: 14 mg/dL (ref 8–23)
CO2: 27 mmol/L (ref 22–32)
Calcium: 9.3 mg/dL (ref 8.9–10.3)
Chloride: 105 mmol/L (ref 98–111)
Creatinine, Ser: 1.2 mg/dL (ref 0.61–1.24)
GFR, Estimated: >60 mL/min (ref >60)
Glucose, Bld: 98 mg/dL (ref 70–99)
Potassium: 4.3 mmol/L (ref 3.5–5.1)
Sodium: 141 mmol/L (ref 135–145)
Total Bilirubin: 1.9 mg/dL — ABNORMAL HIGH (ref 0.3–1.2)
Total Protein: 6.9 g/dL (ref 6.5–8.1)

## 2020-09-15 LAB — URINALYSIS, ROUTINE W REFLEX MICROSCOPIC
Bilirubin Urine: NEGATIVE
Glucose, UA: NEGATIVE mg/dL
Ketones, ur: NEGATIVE mg/dL
Leukocytes,Ua: NEGATIVE
Nitrite: NEGATIVE
Protein, ur: NEGATIVE mg/dL
Specific Gravity, Urine: <1.005 — ABNORMAL LOW (ref 1.005–1.030)
pH: 6.5 (ref 5.0–8.0)

## 2020-09-15 LAB — CBG MONITORING, ED: Glucose-Capillary: 94 mg/dL (ref 70–99)

## 2020-09-15 LAB — URINALYSIS, MICROSCOPIC (REFLEX): WBC, UA: NONE SEEN WBC/hpf (ref 0–5)

## 2020-09-15 LAB — RESPIRATORY PANEL BY RT PCR (FLU A&B, COVID)
Influenza A by PCR: NEGATIVE
Influenza B by PCR: NEGATIVE
SARS Coronavirus 2 by RT PCR: NEGATIVE

## 2020-09-15 LAB — PROTIME-INR
INR: 1.1 (ref 0.8–1.2)
Prothrombin Time: 14.2 seconds (ref 11.4–15.2)

## 2020-09-15 LAB — ETHANOL: Alcohol, Ethyl (B): <10 mg/dL (ref <10)

## 2020-09-15 MED ORDER — ENOXAPARIN SODIUM 40 MG/0.4ML ~~LOC~~ SOLN
40.0000 mg | SUBCUTANEOUS | Status: DC
  Administered 2020-09-15: 40 mg via SUBCUTANEOUS
  Filled 2020-09-15: qty 0.4

## 2020-09-15 MED ORDER — CLOPIDOGREL BISULFATE 75 MG PO TABS
75.0000 mg | ORAL_TABLET | Freq: Every day | ORAL | Status: DC
  Administered 2020-09-15 – 2020-09-16 (×2): 75 mg via ORAL
  Filled 2020-09-15 (×2): qty 1

## 2020-09-15 MED ORDER — ASPIRIN 81 MG PO CHEW
81.0000 mg | CHEWABLE_TABLET | Freq: Once | ORAL | Status: DC
  Filled 2020-09-15: qty 1

## 2020-09-15 MED ORDER — ACETAMINOPHEN 650 MG RE SUPP: 650.0000 mg | RECTAL | Status: DC | PRN

## 2020-09-15 MED ORDER — STROKE: EARLY STAGES OF RECOVERY BOOK
Freq: Once | Status: AC
  Filled 2020-09-15: qty 1

## 2020-09-15 MED ORDER — ACETAMINOPHEN 325 MG PO TABS: 650.0000 mg | ORAL_TABLET | ORAL | Status: DC | PRN

## 2020-09-15 MED ORDER — CARVEDILOL 6.25 MG PO TABS
6.2500 mg | ORAL_TABLET | Freq: Two times a day (BID) | ORAL | Status: DC
  Administered 2020-09-16: 6.25 mg via ORAL
  Filled 2020-09-15 (×2): qty 1

## 2020-09-15 MED ORDER — ACETAMINOPHEN 160 MG/5ML PO SOLN: 650.0000 mg | ORAL | Status: DC | PRN

## 2020-09-15 MED ORDER — IOHEXOL 350 MG/ML SOLN
100.0000 mL | Freq: Once | INTRAVENOUS | Status: AC | PRN
  Administered 2020-09-15: 100 mL via INTRAVENOUS

## 2020-09-15 NOTE — Progress Notes (Signed)
Patient with h/o hypertrophic cardiomyopathy; HTN; HLD; and CAD s/p stent presenting to [facility] with R arm numbness.  Likely small stroke with improving symptoms.  Normal at bedtime, this AM with weak grip and decreased sensation with mild pronator drift.  CT with abnormalities with ?thrombogenic carotid.  Neurology consulted and agree with need for admission.  Will admit to telemetry for now.   Carlyon Shadow, M.D.

## 2020-09-15 NOTE — ED Triage Notes (Signed)
Pt c/o rt arm numbness with rt pinky drawing forward since 9am. States can squeeze his hand but could hold the soap. No facial droop note, no arm drift noted. Pt states has had a headache for past few day.

## 2020-09-15 NOTE — H&P (Signed)
History and Physical    Brandon Gonzales Bertram FAO:[id number] DOB: Jun 29, 1951 DOA: 09/15/2020  PCP: Brandon Grandchild, NP  Patient coming from: Home  I have personally briefly reviewed patient's old medical records in [place]  Chief Complaint: R hand weakness/numbness  HPI: ERVEN Gonzales is a 69 y.o. male with medical history significant of CAD s/p PCI and stents, HOCM EF 55% AICD recd in April 21 but deferred by pt for now, HLD with severe statin intolerance, HTN.  Patient presents to the ED at [facility] with c/o R arm numbness and R 4th and 5th finger weakness.  Symptoms onset at 845am while in the shower.  No associated facial droop.  Symptoms are persistent and unchanged since onset, nothing makes better or worse.  No CP, no palpitations, no lightheadedness, dizziness, syncope, weakness.  ED Course: CTA head and neck showed advanced atherosclerotic dz at bilateral carotid bifurcations, 30% stenosis ICA both sides but pronounced plaque irregularity that could serve as source of embolic dz.  No large vessel intracranial occlusion.  No acute brain CT finding.  Pt transferred to [facility] for stroke work up.   Review of Systems: As per HPI, otherwise all review of systems negative.  Past Medical History:  Diagnosis Date  . Arthritis   . Cancer (HCC)    SKIN CA  SQUAMOUS CELL  . Coronary artery disease    a. 2007 Stent to LAD, PTCA D1;  b. DES to LAD & LCx 10/13 in the setting of STEMI.  [name] Gonzales Dyslipidemia    a. Statin and zetia-intolerant. On niacin.  [name] Gonzales GERD (gastroesophageal reflux disease)   . Heart murmur   . Hypertension   . Hypertrophic cardiomyopathy ([place])    a. 09/2012 Echo: EF 55%, mid to apical anterolateral, posterior, apical anterior, apical HK, Gr1 DD, turbulence across LVOT suggesting degree of obstruction, mild MR, mildly dil LA.    Past Surgical History:  Procedure Laterality Date  . CARDIAC CATHETERIZATION  2007   stent to LAD and PTCA of diagonal branch  . CARDIAC  CATHETERIZATION  2010  . CORONARY ANGIOPLASTY WITH STENT PLACEMENT  2013   95-99% prox LAD, patent LAD stent distal to this, occluded mid-dist LCx, mild <10% RCA irreg; s/p DES-prox LAD & DES to mid-dist LCx  . CORONARY BALLOON ANGIOPLASTY N/A 05/16/2017   Procedure: Coronary Balloon Angioplasty;  Surgeon: Brandon Man, MD;  Location: [place] CV LAB;  Service: Cardiovascular;  Laterality: N/A;  . [place]   right  . [place]   left  . LEFT HEART CATH Bilateral 09/22/2012   Procedure: LEFT HEART CATH;  Surgeon: Brandon M Martinique, MD;  Location: [facility] CATH LAB;  Service: Cardiovascular;  Laterality: Bilateral;  . LEFT HEART CATH AND CORONARY ANGIOGRAPHY N/A 05/16/2017   Procedure: Left Heart Cath and Coronary Angiography;  Surgeon: Brandon Man, MD;  Location: [place] CV LAB;  Service: Cardiovascular;  Laterality: N/A;  . PERCUTANEOUS CORONARY STENT INTERVENTION (PCI-S) Right 09/22/2012   Procedure: PERCUTANEOUS CORONARY STENT INTERVENTION (PCI-S);  Surgeon: Brandon M Martinique, MD;  Location: [facility] CATH LAB;  Service: Cardiovascular;  Laterality: Right;     reports that he has never smoked. He has never used smokeless tobacco. He reports that he does not drink alcohol and does not use drugs.  Allergies  Allergen Reactions  . Antihistamines, Chlorpheniramine-Type Other (See Comments)    Altered mental status  . Pheniramine Other (See Comments)    Altered mental  status  . Statins Other (See Comments)    Leg cramps    . Zetia [Ezetimibe] Other (See Comments)    Leg cramps    Family History  Problem Relation Age of Onset  . Heart attack Mother 66  . Heart disease Mother      Prior to Admission medications   Medication Sig Start Date End Date Taking? Authorizing Provider  amLODipine (NORVASC) 2.5 MG tablet TAKE 1 TABLET BY MOUTH EVERY DAY Patient taking differently: Take 2.5 mg by mouth daily.  03/15/20  Yes Gonzales, Brandon M, MD  aspirin EC 81 MG tablet Take 81  mg by mouth daily.   Yes [provider]  calcium carbonate (TUMS EX) 750 MG chewable tablet Chew 2 tablets by mouth daily as needed for heartburn.   Yes [provider]  carvedilol (COREG) 12.5 MG tablet TAKE 0.5 TABLETS (6.25 MG TOTAL) BY MOUTH 2 (TWO) TIMES DAILY. 12/24/19  Yes Gonzales, Brandon M, MD  esomeprazole (NEXIUM) 20 MG capsule Take 20 mg by mouth every morning.   Yes [provider]  Inositol Niacinate (NIACIN FLUSH FREE) 500 MG CAPS Take 1,000 mg by mouth daily.   Yes [provider]  lisinopril (ZESTRIL) 40 MG tablet Take 1 tablet (40 mg total) by mouth daily. 02/03/20  Yes Gonzales, Brandon M, MD  nitroGLYCERIN (NITROSTAT) 0.4 MG SL tablet Place 1 tablet (0.4 mg total) under the tongue every 5 (five) minutes as needed for chest pain. 09/13/20 12/12/20 Yes Gonzales, Brandon M, MD  valACYclovir (VALTREX) 500 MG tablet Take 1 tablet (500 mg total) by mouth daily as needed (fever blisters). 07/26/20  Yes Gonzales, Brandon A, NP  AMBULATORY NON FORMULARY MEDICATION Take 180 mg by mouth daily. Medication Name: bempedoic acid vs a placebo CLEAR Research Study drug provided 05/02/17   Gonzales Perla, MD    Physical Exam: Vitals:   09/15/20 1635 09/15/20 1743 09/15/20 1819 09/15/20 1923  BP: (!) 148/101 (!) 145/90 (!) 175/98 (!) 162/82  Pulse: 78 76 67 61  Resp: 19 16 16 18   Temp: 98.4 F (36.9 C) 98.2 F (36.8 C) 97.7 F (36.5 C) 98.1 F (36.7 C)  TempSrc: Oral Oral Oral Oral  SpO2: 100% 98% 97% 99%    Constitutional: NAD, calm, comfortable Eyes: PERRL, lids and conjunctivae normal ENMT: Mucous membranes are moist. Posterior pharynx clear of any exudate or lesions.Normal dentition.  Neck: normal, supple, no masses, no thyromegaly Respiratory: clear to auscultation bilaterally, no wheezing, no crackles. Normal respiratory effort. No accessory muscle use.  Cardiovascular: Regular rate and rhythm, no murmurs / rubs / gallops. No extremity edema. 2+ pedal pulses. No  carotid bruits.  Abdomen: no tenderness, no masses palpated. No hepatosplenomegaly. Bowel sounds positive.  Musculoskeletal: no clubbing / cyanosis. No joint deformity upper and lower extremities. Good ROM, no contractures. Normal muscle tone.  Skin: no rashes, lesions, ulcers. No induration Neurologic: R arm weak grip strength, mild pronator drift, decreased sensation. Psychiatric: Normal judgment and insight. Alert and oriented x 3. Normal mood.    Labs on Admission: I have personally reviewed following labs and imaging studies  CBC: Recent Labs  Lab 09/15/20 1131  WBC 6.7  NEUTROABS 5.0  HGB 16.7  HCT 49.1  MCV 88.8  PLT 240   Basic Metabolic Panel: Recent Labs  Lab 09/15/20 1131  NA 141  K 4.3  CL 105  CO2 27  GLUCOSE 98  BUN 14  CREATININE 1.20  CALCIUM 9.3   GFR:  Estimated Creatinine Clearance: 55.6 mL/min (by C-G formula based on SCr of 1.2 mg/dL). Liver Function Tests: Recent Labs  Lab 09/15/20 1131  AST 25  ALT 25  ALKPHOS 52  BILITOT 1.9*  PROT 6.9  ALBUMIN 4.5   No results for input(s): LIPASE, AMYLASE in the last 168 hours. No results for input(s): AMMONIA in the last 168 hours. Coagulation Profile: Recent Labs  Lab 09/15/20 1153  INR 1.1   Cardiac Enzymes: No results for input(s): CKTOTAL, CKMB, CKMBINDEX, TROPONINI in the last 168 hours. BNP (last 3 results) No results for input(s): PROBNP in the last 8760 hours. HbA1C: No results for input(s): HGBA1C in the last 72 hours. CBG: Recent Labs  Lab 09/15/20 1124  GLUCAP 94   Lipid Profile: No results for input(s): CHOL, HDL, LDLCALC, TRIG, CHOLHDL, LDLDIRECT in the last 72 hours. Thyroid Function Tests: No results for input(s): TSH, T4TOTAL, FREET4, T3FREE, THYROIDAB in the last 72 hours. Anemia Panel: No results for input(s): VITAMINB12, FOLATE, FERRITIN, TIBC, IRON, RETICCTPCT in the last 72 hours. Urine analysis:    Component Value Date/Time   COLORURINE YELLOW 09/15/2020 [place] 09/15/2020 1131   LABSPEC <1.005 (L) 09/15/2020 1131   PHURINE 6.5 09/15/2020 1131   GLUCOSEU NEGATIVE 09/15/2020 1131   HGBUR TRACE (A) 09/15/2020 [place] 09/15/2020 [place] 09/15/2020 1131   PROTEINUR NEGATIVE 09/15/2020 1131   NITRITE NEGATIVE 09/15/2020 [place] 09/15/2020 1131    Radiological Exams on Admission: CT Angio Head W or Wo Contrast  Result Date: 09/15/2020 CLINICAL DATA:  Right arm numbness.  Symptoms began 2-3 hours ago. EXAM: CT ANGIOGRAPHY HEAD AND NECK TECHNIQUE: Multidetector CT imaging of the head and neck was performed using the standard protocol during bolus administration of intravenous contrast. Multiplanar CT image reconstructions and MIPs were obtained to evaluate the vascular anatomy. Carotid stenosis measurements (when applicable) are obtained utilizing NASCET criteria, using the distal internal carotid diameter as the denominator. CONTRAST:  185mL OMNIPAQUE IOHEXOL 350 MG/ML SOLN COMPARISON:  None. FINDINGS: CT HEAD FINDINGS Brain: Mild age related volume loss. No sign of acute infarction, mass lesion, hemorrhage, hydrocephalus or extra-axial collection. Vascular: There is atherosclerotic calcification of the major vessels at the base of the brain. Skull: Negative Sinuses: Clear Orbits: Normal Review of the MIP images confirms the above findings CTA NECK FINDINGS Aortic arch: Aortic atherosclerosis. Branching pattern is normal. No origin stenosis. Right carotid system: Innominate artery is widely patent. Common carotid artery is widely patent, with a few areas of nonstenotic plaque. At the carotid bifurcation and ICA bulb, there is advanced irregular soft and calcified plaque. Pronounced irregularity could serve as a source of embolic disease. Minimal diameter throughout the region measures 3.5 mm. Compared to a more distal cervical ICA diameter of 5 mm, this indicates a 30% stenosis. Left  carotid system: Common carotid artery shows some atherosclerotic plaque but no stenosis proximal to the bifurcation region. At the carotid bifurcation and ICA bulb, there is soft and calcified plaque. As seen on the right, there is considerable irregularity which could serve as a source of embolic disease. Minimal diameter of 3.5 mm indicates a stenosis of 30%. Cervical ICA widely patent beyond that. Vertebral arteries: Soft and calcified plaque at the proximal right subclavian artery with 50% stenosis. Atherosclerotic plaque at the right vertebral artery origin with stenosis of 30%. Beyond that, the vessel is widely patent through the cervical region to the  foramen magnum. Left subclavian artery shows some atherosclerotic change but no measurable stenosis. 50% stenosis of the left vertebral artery origin. Beyond that, the vessel is widely patent through the cervical region. Skeleton: Ordinary cervical spondylosis and facet osteoarthritis. Other neck: No mass or lymphadenopathy. Upper chest: Negative Review of the MIP images confirms the above findings CTA HEAD FINDINGS Anterior circulation: Both internal carotid arteries are widely patent through the skull base and siphon regions. Ordinary siphon atherosclerosis without stenosis greater than 30%. The anterior and middle cerebral vessels are patent without large or medium vessel occlusion or correctable proximal stenosis. Posterior circulation: Both vertebral arteries are patent through the foramen magnum to the basilar. Narrowing of the mid to distal basilar artery with atherosclerotic irregularity. Flow is present in both superior cerebellar and posterior cerebral arteries. Right PCA receives most of it supply from the anterior circulation. Venous sinuses: Patent and normal. Anatomic variants: None significant. Review of the MIP images confirms the above findings IMPRESSION: 1. Advanced atherosclerotic disease at both carotid bifurcations and ICA bulbs. 30%  stenosis of the internal carotid arteries on both sides. Pronounced irregularity could serve as a source of embolic disease. 2. 50% stenosis of the proximal right subclavian artery. 30% stenosis of the right vertebral artery origin. 50% stenosis of the left vertebral artery origin. Narrowing of the mid to distal basilar artery with atherosclerotic irregularity. Flow is present in both superior cerebellar and posterior cerebral arteries. 3. No intracranial large or medium vessel occlusion or correctable proximal stenosis. 4. No acute brain CT finding. Aortic Atherosclerosis (ICD10-I70.0). Electronically Signed   By: Nelson Chimes M.D.   On: 09/15/2020 12:13   DG Chest 2 View  Result Date: 09/15/2020 CLINICAL DATA:  Stroke, right-sided weakness EXAM: CHEST - 2 VIEW COMPARISON:  None. FINDINGS: Lungs are well expanded, symmetric, and clear. No pneumothorax or pleural effusion. Cardiac size within normal limits. Coronary artery stenting has been performed. Pulmonary vascularity is normal. Osseous structures are age-appropriate. No acute bone abnormality. IMPRESSION: No active cardiopulmonary disease. Electronically Signed   By: Fidela Salisbury MD   On: 09/15/2020 20:30   CT Angio Neck W and/or Wo Contrast  Result Date: 09/15/2020 CLINICAL DATA:  Right arm numbness.  Symptoms began 2-3 hours ago. EXAM: CT ANGIOGRAPHY HEAD AND NECK TECHNIQUE: Multidetector CT imaging of the head and neck was performed using the standard protocol during bolus administration of intravenous contrast. Multiplanar CT image reconstructions and MIPs were obtained to evaluate the vascular anatomy. Carotid stenosis measurements (when applicable) are obtained utilizing NASCET criteria, using the distal internal carotid diameter as the denominator. CONTRAST:  126mL OMNIPAQUE IOHEXOL 350 MG/ML SOLN COMPARISON:  None. FINDINGS: CT HEAD FINDINGS Brain: Mild age related volume loss. No sign of acute infarction, mass lesion, hemorrhage,  hydrocephalus or extra-axial collection. Vascular: There is atherosclerotic calcification of the major vessels at the base of the brain. Skull: Negative Sinuses: Clear Orbits: Normal Review of the MIP images confirms the above findings CTA NECK FINDINGS Aortic arch: Aortic atherosclerosis. Branching pattern is normal. No origin stenosis. Right carotid system: Innominate artery is widely patent. Common carotid artery is widely patent, with a few areas of nonstenotic plaque. At the carotid bifurcation and ICA bulb, there is advanced irregular soft and calcified plaque. Pronounced irregularity could serve as a source of embolic disease. Minimal diameter throughout the region measures 3.5 mm. Compared to a more distal cervical ICA diameter of 5 mm, this indicates a 30% stenosis. Left carotid system: Common carotid artery shows  some atherosclerotic plaque but no stenosis proximal to the bifurcation region. At the carotid bifurcation and ICA bulb, there is soft and calcified plaque. As seen on the right, there is considerable irregularity which could serve as a source of embolic disease. Minimal diameter of 3.5 mm indicates a stenosis of 30%. Cervical ICA widely patent beyond that. Vertebral arteries: Soft and calcified plaque at the proximal right subclavian artery with 50% stenosis. Atherosclerotic plaque at the right vertebral artery origin with stenosis of 30%. Beyond that, the vessel is widely patent through the cervical region to the foramen magnum. Left subclavian artery shows some atherosclerotic change but no measurable stenosis. 50% stenosis of the left vertebral artery origin. Beyond that, the vessel is widely patent through the cervical region. Skeleton: Ordinary cervical spondylosis and facet osteoarthritis. Other neck: No mass or lymphadenopathy. Upper chest: Negative Review of the MIP images confirms the above findings CTA HEAD FINDINGS Anterior circulation: Both internal carotid arteries are widely patent  through the skull base and siphon regions. Ordinary siphon atherosclerosis without stenosis greater than 30%. The anterior and middle cerebral vessels are patent without large or medium vessel occlusion or correctable proximal stenosis. Posterior circulation: Both vertebral arteries are patent through the foramen magnum to the basilar. Narrowing of the mid to distal basilar artery with atherosclerotic irregularity. Flow is present in both superior cerebellar and posterior cerebral arteries. Right PCA receives most of it supply from the anterior circulation. Venous sinuses: Patent and normal. Anatomic variants: None significant. Review of the MIP images confirms the above findings IMPRESSION: 1. Advanced atherosclerotic disease at both carotid bifurcations and ICA bulbs. 30% stenosis of the internal carotid arteries on both sides. Pronounced irregularity could serve as a source of embolic disease. 2. 50% stenosis of the proximal right subclavian artery. 30% stenosis of the right vertebral artery origin. 50% stenosis of the left vertebral artery origin. Narrowing of the mid to distal basilar artery with atherosclerotic irregularity. Flow is present in both superior cerebellar and posterior cerebral arteries. 3. No intracranial large or medium vessel occlusion or correctable proximal stenosis. 4. No acute brain CT finding. Aortic Atherosclerosis (ICD10-I70.0). Electronically Signed   By: Nelson Chimes M.D.   On: 09/15/2020 12:13    EKG: Independently reviewed.  Assessment/Plan Principal Problem:   Acute ischemic stroke [facility]) Active Problems:   Dyslipidemia   Hypertrophic cardiomyopathy (HCC)   Essential hypertension   CAD S/P percutaneous coronary angioplasty    1. Acute ischemic stroke - 1. DDx includes ulnar neuropathy 2. Stroke pathway 3. Frequent neuro checks 4. 2d echo 5. MRI brain 6. Tele monitor 7. Pt started on ASA/Plavix 8. Neuro consulted 9. PT/OT/SLP 2. HTN - 1. Will try modified  permissive HTN and see if he can tolerate 2. Holding lisinopril and amlodipine 3. Cont coreg in setting of HOCM 3. HOCM - 1. Cont coreg 2. AICD recd by cardiology (see also Dr. Doug Sou note from 2 days ago). 4. CAD - 1. Cont ASA 81, getting started on plavix for stroke risk 2. Cont niacin 5. HLD - 1. Cont niacin 2. Intolerant of statins 3. Currently discussing PSCK 9 with Dr. Martinique 4. im checking FLP as part of stroke work up anyhow  DVT prophylaxis: Lovenox Code Status: Full Family Communication: No family in room Disposition Plan: Home after stroke work up completed Consults called: EDP spoke with neurology prior to transfer Admission status: Admit to inpatient  Severity of Illness: The appropriate patient status for this patient is INPATIENT. Inpatient status  is judged to be reasonable and necessary in order to provide the required intensity of service to ensure the patient's safety. The patient's presenting symptoms, physical exam findings, and initial radiographic and laboratory data in the context of their chronic comorbidities is felt to place them at high risk for further clinical deterioration. Furthermore, it is not anticipated that the patient will be medically stable for discharge from the hospital within 2 midnights of admission. The following factors support the patient status of inpatient.   IP status for persistent neuro deficits, suspect acute ischemic stroke.  Findings on imaging suggesting embolic source from carotid.  * I certify that at the point of admission it is my clinical judgment that the patient will require inpatient hospital care spanning beyond 2 midnights from the point of admission due to high intensity of service, high risk for further deterioration and high frequency of surveillance required.*    Breckin Savannah M. DO [facility]  How to contact the [facility] Attending or Consulting provider [place] or covering provider during after hours [place],  for this patient?  1. Check the care team in [facility] and look for a) attending/consulting [facility] provider listed and b) the [facility] team listed 2. Log into [email or web address]  Amion Physician Scheduling and messaging for groups and whole hospitals  On call and physician scheduling software for group practices, residents, hospitalists and other medical providers for call, clinic, rotation and shift schedules. OnCall [employer] is a hospital-wide system for scheduling doctors and paging doctors on call. EasyPlot is for scientific plotting and data analysis.  [email or web address]  and use [place]'s universal password to access. If you do not have the password, please contact the hospital operator.  3. Locate the [facility] provider you are looking for under [facility] and page to a number that you can be directly reached. 4. If you still have difficulty reaching the provider, please page the [facility] (Director on Call) for the Hospitalists listed on amion for assistance.  09/15/2020, 8:52 PM

## 2020-09-15 NOTE — ED Triage Notes (Addendum)
R arm tingling onset 8:45a, states he is unsure if he woke up normal. Reports difficulty showering and holding the soap in the shower. He went to bed at 10p

## 2020-09-15 NOTE — Progress Notes (Signed)
Pt arrived onto unit via [facility]. All belongings and wife is at the bedside. Pt reports no pain. Tele has been connected and IV is intact. Telephone and call light are within reach.   09/15/20 1819  Vitals  Temp 97.7 F (36.5 C)  Temp Source Oral  BP (!) 175/98  MAP (mmHg) 119  BP Location Right Arm  BP Method Automatic  Patient Position (if appropriate) Sitting  Pulse Rate 67  Pulse Rate Source Dinamap  Resp 16  Level of Consciousness  Level of Consciousness Alert  MEWS COLOR  MEWS Score Color Green  Oxygen Therapy  SpO2 97 %  O2 Device Room Air  Patient Activity (if Appropriate) In bed  Pulse Oximetry Type Continuous  Pain Assessment  Pain Scale 0-10  Pain Score 0  ECG Monitoring  Telemetry Box Number [id number]  MEWS Score  MEWS Temp 0  MEWS Systolic 0  MEWS Pulse 0  MEWS RR 0  MEWS LOC 0  MEWS Score 0

## 2020-09-15 NOTE — ED Notes (Signed)
ED Provider at bedside. 

## 2020-09-15 NOTE — ED Provider Notes (Signed)
[facility]    CSN: [id number] Arrival date & time: 09/15/20  1014      History   Chief Complaint Chief Complaint  Patient presents with  . rt arm numbness    HPI Brandon Gonzales is a 69 y.o. male  With extensive medical history as below presenting for 2-hour course of right arm numbness and slight right digit (4 and 5) flexion.  States this began around 845 on the shower.  Had difficulty gripping the soap.  States he got out of the shower and checked his face for facial droop: None.  Concern for TIA/stroke.  No history thereof.  [facility] cardiologist Monday: No change in medications.  Denies chest pain, palpitations, lightheadedness or dizziness, weakness.  No recent injury, overuse.  Past Medical History:  Diagnosis Date  . Arthritis   . Cancer (HCC)    SKIN CA  SQUAMOUS CELL  . Coronary artery disease    a. 2007 Stent to LAD, PTCA D1;  b. DES to LAD & LCx 10/13 in the setting of STEMI.  [name] Kitchen Dyslipidemia    a. Statin and zetia-intolerant. On niacin.  [name] Kitchen GERD (gastroesophageal reflux disease)   . Heart murmur   . Hypertension   . Hypertrophic cardiomyopathy ([place])    a. 09/2012 Echo: EF 55%, mid to apical anterolateral, posterior, apical anterior, apical HK, Gr1 DD, turbulence across LVOT suggesting degree of obstruction, mild MR, mildly dil LA.    Patient Active Problem List   Diagnosis Date Noted  . Near syncope 12/17/2019  . Statin myopathy 11/13/2019  . Herpes labialis 01/31/2018  . Healthcare maintenance 01/31/2018  . Essential hypertension   . Heart murmur   . GERD (gastroesophageal reflux disease)   . CAD S/P percutaneous coronary angioplasty   . Cancer ([place])   . Angina at rest [facility]) 05/17/2017  . Hypertrophic cardiomyopathy ([place])   . S/P PTCA (percutaneous transluminal coronary angioplasty)   . Chest pain 05/16/2017  . Unstable angina ([place])   . Dyspnea on exertion 02/21/2017  . Neck pain 12/28/2016  . NSVT (nonsustained ventricular tachycardia)  ([place]) 09/25/2012  . Hypertrophic obstructive cardiomyopathy(425.11) 09/25/2012  . CKD (chronic kidney disease) stage 3, GFR 30-59 ml/min (HCC) 09/25/2012  . ST elevation myocardial infarction (STEMI) of lateral wall ([place]) 09/22/2012  . CAD (coronary artery disease) 01/11/2012  . Dyslipidemia   . H/O gastroesophageal reflux (GERD)   . Arthritis     Past Surgical History:  Procedure Laterality Date  . CARDIAC CATHETERIZATION  2007   stent to LAD and PTCA of diagonal branch  . CARDIAC CATHETERIZATION  2010  . CORONARY ANGIOPLASTY WITH STENT PLACEMENT  2013   95-99% prox LAD, patent LAD stent distal to this, occluded mid-dist LCx, mild <10% RCA irreg; s/p DES-prox LAD & DES to mid-dist LCx  . CORONARY BALLOON ANGIOPLASTY N/A 05/16/2017   Procedure: Coronary Balloon Angioplasty;  Surgeon: Leonie Man, MD;  Location: [place] CV LAB;  Service: Cardiovascular;  Laterality: N/A;  . [place]   right  . [place]   left  . LEFT HEART CATH Bilateral 09/22/2012   Procedure: LEFT HEART CATH;  Surgeon: Peter M Martinique, MD;  Location: [facility] CATH LAB;  Service: Cardiovascular;  Laterality: Bilateral;  . LEFT HEART CATH AND CORONARY ANGIOGRAPHY N/A 05/16/2017   Procedure: Left Heart Cath and Coronary Angiography;  Surgeon: Leonie Man, MD;  Location: [place] CV LAB;  Service: Cardiovascular;  Laterality: N/A;  .  PERCUTANEOUS CORONARY STENT INTERVENTION (PCI-S) Right 09/22/2012   Procedure: PERCUTANEOUS CORONARY STENT INTERVENTION (PCI-S);  Surgeon: Peter M Martinique, MD;  Location: [facility] CATH LAB;  Service: Cardiovascular;  Laterality: Right;       Home Medications    Prior to Admission medications   Medication Sig Start Date End Date Taking? Authorizing Provider  AMBULATORY NON FORMULARY MEDICATION Take 180 mg by mouth daily. Medication Name: bempedoic acid vs a placebo CLEAR Research Study drug provided 05/02/17   Lelon Perla, MD  amLODipine (NORVASC) 2.5 MG  tablet TAKE 1 TABLET BY MOUTH EVERY DAY Patient taking differently: Take 2.5 mg by mouth daily.  03/15/20   Martinique, Peter M, MD  aspirin EC 81 MG tablet Take 81 mg by mouth daily.    [provider]  calcium carbonate (TUMS EX) 750 MG chewable tablet Chew 2 tablets by mouth daily as needed for heartburn.    [provider]  carvedilol (COREG) 12.5 MG tablet TAKE 0.5 TABLETS (6.25 MG TOTAL) BY MOUTH 2 (TWO) TIMES DAILY. 12/24/19   Martinique, Peter M, MD  esomeprazole (NEXIUM) 20 MG capsule Take 20 mg by mouth every morning.    [provider]  Inositol Niacinate (NIACIN FLUSH FREE) 500 MG CAPS Take 1,000 mg by mouth daily.    [provider]  lisinopril (ZESTRIL) 40 MG tablet Take 1 tablet (40 mg total) by mouth daily. 02/03/20   Martinique, Peter M, MD  nitroGLYCERIN (NITROSTAT) 0.4 MG SL tablet Place 1 tablet (0.4 mg total) under the tongue every 5 (five) minutes as needed for chest pain. 09/13/20 12/12/20  Martinique, Peter M, MD  valACYclovir (VALTREX) 500 MG tablet Take 1 tablet (500 mg total) by mouth daily as needed (fever blisters). 07/26/20   Orvan July, NP    Family History Family History  Problem Relation Age of Onset  . Heart attack Mother 2  . Heart disease Mother     Social History Social History   Tobacco Use  . Smoking status: Never Smoker  . Smokeless tobacco: Never Used  Vaping Use  . Vaping Use: Never used  Substance Use Topics  . Alcohol use: No  . Drug use: No     Allergies   Antihistamines, chlorpheniramine-type; Pheniramine; Statins; and Zetia [ezetimibe]   Review of Systems As per HPI   Physical Exam Triage Vital Signs ED Triage Vitals  Enc Vitals Group     BP      Pulse      Resp      Temp      Temp src      SpO2      Weight      Height      Head Circumference      Peak Flow      Pain Score      Pain Loc      Pain Edu?      Excl. in [place]?    No data found.  Updated Vital Signs BP (!) 173/108 (BP Location: Left  Arm)   Pulse 71   Temp 98 F (36.7 C) (Oral)   Resp 18   SpO2 95%   Visual Acuity Right Eye Distance:   Left Eye Distance:   Bilateral Distance:    Right Eye Near:   Left Eye Near:    Bilateral Near:     Physical Exam Constitutional:      General: He is not in acute distress. HENT:     Head:  Normocephalic and atraumatic.     Right Ear: Tympanic membrane, ear canal and external ear normal.     Left Ear: Tympanic membrane, ear canal and external ear normal.     Mouth/Throat:     Mouth: Mucous membranes are moist.     Pharynx: Oropharynx is clear. No pharyngeal swelling or oropharyngeal exudate.  Eyes:     General: No scleral icterus.    Extraocular Movements: Extraocular movements intact.     Conjunctiva/sclera: Conjunctivae normal.     Pupils: Pupils are equal, round, and reactive to light.  Neck:     Vascular: No JVD.     Trachea: No tracheal deviation.  Cardiovascular:     Rate and Rhythm: Normal rate and regular rhythm.  Pulmonary:     Effort: Pulmonary effort is normal. No respiratory distress.     Breath sounds: No decreased breath sounds, wheezing, rhonchi or rales.  Chest:     Chest wall: No deformity, tenderness or crepitus.  Musculoskeletal:        General: No deformity. Normal range of motion.     Cervical back: Normal range of motion. No rigidity or tenderness.     Right lower leg: No tenderness. No edema.     Left lower leg: No tenderness. No edema.  Lymphadenopathy:     Cervical: No cervical adenopathy.  Skin:    General: Skin is warm.     Capillary Refill: Capillary refill takes less than 2 seconds.     Coloration: Skin is not cyanotic, jaundiced or pale.     Findings: No bruising or rash.  Neurological:     General: No focal deficit present.     Mental Status: He is alert and oriented to person, place, and time.     Cranial Nerves: Cranial nerves are intact.     Sensory: Sensation is intact.     Motor: Motor function is intact.      Coordination: Coordination is intact.     Gait: Gait is intact.     Deep Tendon Reflexes: Reflexes are normal and symmetric.  Psychiatric:        Mood and Affect: Mood normal.        Behavior: Behavior normal.      UC Treatments / Results  Labs (all labs ordered are listed, but only abnormal results are displayed) Labs Reviewed - No data to display  EKG   Radiology No results found.  Procedures Procedures (including critical care time)  Medications Ordered in UC Medications - No data to display  Initial Impression / Assessment and Plan / UC Course  I have reviewed the triage vital signs and the nursing notes.  Pertinent labs & imaging results that were available during my care of the patient were reviewed by me and considered in my medical decision making (see chart for details).     Patient with elevated blood pressure from baseline.  Reports compliance with antihypertensives: No chest pain.  Neuro exam significant for perceived sensory difference in right arm with mild flexion of right hand, fourth and fifth digits.  Otherwise unremarkable.  Discussed utility of neuroimaging in setting of acute concern for TIA/CVA.  Greatest risk factors are age, comorbidities.  Pt declined EMS for transportation: electing to transport in personal vehicle with wife in stable condition.  Return precautions discussed, pt verbalized understanding and is agreeable to plan. Final Clinical Impressions(s) / UC Diagnoses   Final diagnoses:  Right hand weakness  Numbness of right hand  Discharge Instructions   None    ED Prescriptions    None     PDMP not reviewed this encounter.   [place], [place], [place] 09/15/20 1054

## 2020-09-15 NOTE — ED Notes (Signed)
Attempted to call for report. 

## 2020-09-16 ENCOUNTER — Inpatient Hospital Stay (HOSPITAL_COMMUNITY): Payer: Medicare HMO

## 2020-09-16 ENCOUNTER — Inpatient Hospital Stay (HOSPITAL_BASED_OUTPATIENT_CLINIC_OR_DEPARTMENT_OTHER): Payer: Medicare HMO

## 2020-09-16 DIAGNOSIS — I639 Cerebral infarction, unspecified: Secondary | ICD-10-CM | POA: Diagnosis not present

## 2020-09-16 DIAGNOSIS — I6389 Other cerebral infarction: Secondary | ICD-10-CM | POA: Diagnosis not present

## 2020-09-16 DIAGNOSIS — I10 Essential (primary) hypertension: Secondary | ICD-10-CM | POA: Diagnosis not present

## 2020-09-16 DIAGNOSIS — M4802 Spinal stenosis, cervical region: Secondary | ICD-10-CM | POA: Diagnosis not present

## 2020-09-16 DIAGNOSIS — M47812 Spondylosis without myelopathy or radiculopathy, cervical region: Secondary | ICD-10-CM | POA: Diagnosis not present

## 2020-09-16 DIAGNOSIS — R202 Paresthesia of skin: Secondary | ICD-10-CM

## 2020-09-16 DIAGNOSIS — R200 Anesthesia of skin: Secondary | ICD-10-CM

## 2020-09-16 DIAGNOSIS — R531 Weakness: Secondary | ICD-10-CM | POA: Diagnosis not present

## 2020-09-16 DIAGNOSIS — I351 Nonrheumatic aortic (valve) insufficiency: Secondary | ICD-10-CM

## 2020-09-16 DIAGNOSIS — I422 Other hypertrophic cardiomyopathy: Secondary | ICD-10-CM | POA: Diagnosis not present

## 2020-09-16 LAB — LIPID PANEL
Cholesterol: 208 mg/dL — ABNORMAL HIGH (ref 0–200)
HDL: 27 mg/dL — ABNORMAL LOW (ref >40)
LDL Cholesterol: 133 mg/dL — ABNORMAL HIGH (ref 0–99)
Total CHOL/HDL Ratio: 7.7 RATIO
Triglycerides: 238 mg/dL — ABNORMAL HIGH (ref <150)
VLDL: 48 mg/dL — ABNORMAL HIGH (ref 0–40)

## 2020-09-16 LAB — ECHOCARDIOGRAM COMPLETE
Area-P 1/2: 2.71 cm2
Calc EF: 57.1 %
S' Lateral: 4.3 cm
Single Plane A2C EF: 61.7 %
Single Plane A4C EF: 52.8 %

## 2020-09-16 MED ORDER — LORAZEPAM 2 MG/ML IJ SOLN
1.0000 mg | Freq: Once | INTRAMUSCULAR | Status: AC
  Administered 2020-09-16: 1 mg via INTRAVENOUS
  Filled 2020-09-16: qty 1

## 2020-09-16 MED ORDER — CLOPIDOGREL BISULFATE 75 MG PO TABS
75.0000 mg | ORAL_TABLET | Freq: Every day | ORAL | 0 refills | Status: AC
Start: 2020-09-17 — End: 2020-10-17

## 2020-09-16 MED ORDER — ASPIRIN EC 81 MG PO TBEC: 81.0000 mg | DELAYED_RELEASE_TABLET | Freq: Every day | ORAL | Status: DC

## 2020-09-16 NOTE — Plan of Care (Signed)
Problem: Education: Goal: Knowledge of General Education information will improve Description: Including pain rating scale, medication(s)/side effects and non-pharmacologic comfort measures 09/16/2020 1511 by Myriam Forehand, RN Outcome: Adequate for Discharge 09/16/2020 1510 by Myriam Forehand, RN Outcome: Adequate for Discharge 09/16/2020 1510 by Myriam Forehand, RN Outcome: Adequate for Discharge 09/16/2020 1510 by Myriam Forehand, RN Outcome: Adequate for Discharge 09/16/2020 1509 by Myriam Forehand, RN Outcome: Adequate for Discharge   Problem: Health Behavior/Discharge Planning: Goal: Ability to manage health-related needs will improve 09/16/2020 1511 by Myriam Forehand, RN Outcome: Adequate for Discharge 09/16/2020 1510 by Myriam Forehand, RN Outcome: Adequate for Discharge 09/16/2020 1510 by Myriam Forehand, RN Outcome: Adequate for Discharge 09/16/2020 1510 by Myriam Forehand, RN Outcome: Adequate for Discharge 09/16/2020 1509 by Myriam Forehand, RN Outcome: Adequate for Discharge   Problem: Clinical Measurements: Goal: Ability to maintain clinical measurements within normal limits will improve 09/16/2020 1511 by Myriam Forehand, RN Outcome: Adequate for Discharge 09/16/2020 1510 by Myriam Forehand, RN Outcome: Adequate for Discharge 09/16/2020 1510 by Myriam Forehand, RN Outcome: Adequate for Discharge 09/16/2020 1510 by Myriam Forehand, RN Outcome: Adequate for Discharge 09/16/2020 1509 by Myriam Forehand, RN Outcome: Adequate for Discharge Goal: Will remain free from infection 09/16/2020 1511 by Myriam Forehand, RN Outcome: Adequate for Discharge 09/16/2020 1510 by Myriam Forehand, RN Outcome: Adequate for Discharge 09/16/2020 1510 by Myriam Forehand, RN Outcome: Adequate for Discharge 09/16/2020 1510 by Myriam Forehand, RN Outcome: Adequate for Discharge 09/16/2020 1509 by Myriam Forehand, RN Outcome: Adequate for Discharge Goal: Diagnostic test results will improve 09/16/2020 1511 by Myriam Forehand, RN Outcome: Adequate for Discharge 09/16/2020 1510 by Myriam Forehand, RN Outcome: Adequate for Discharge 09/16/2020 1510 by Myriam Forehand, RN Outcome: Adequate for Discharge 09/16/2020 1510 by Myriam Forehand, RN Outcome: Adequate for Discharge 09/16/2020 1509 by Myriam Forehand, RN Outcome: Adequate for Discharge Goal: Respiratory complications will improve 09/16/2020 1511 by Myriam Forehand, RN Outcome: Adequate for Discharge 09/16/2020 1510 by Myriam Forehand, RN Outcome: Adequate for Discharge 09/16/2020 1510 by Myriam Forehand, RN Outcome: Adequate for Discharge 09/16/2020 1510 by Myriam Forehand, RN Outcome: Adequate for Discharge 09/16/2020 1509 by Myriam Forehand, RN Outcome: Adequate for Discharge Goal: Cardiovascular complication will be avoided 09/16/2020 1511 by Myriam Forehand, RN Outcome: Adequate for Discharge 09/16/2020 1510 by Myriam Forehand, RN Outcome: Adequate for Discharge 09/16/2020 1510 by Myriam Forehand, RN Outcome: Adequate for Discharge 09/16/2020 1510 by Myriam Forehand, RN Outcome: Adequate for Discharge 09/16/2020 1509 by Myriam Forehand, RN Outcome: Adequate for Discharge   Problem: Activity: Goal: Risk for activity intolerance will decrease 09/16/2020 1511 by Myriam Forehand, RN Outcome: Adequate for Discharge 09/16/2020 1510 by Myriam Forehand, RN Outcome: Adequate for Discharge 09/16/2020 1510 by Myriam Forehand, RN Outcome: Adequate for Discharge 09/16/2020 1510 by Myriam Forehand, RN Outcome: Adequate for Discharge 09/16/2020 1509 by Myriam Forehand, RN Outcome: Adequate for Discharge   Problem: Nutrition: Goal: Adequate nutrition will be maintained 09/16/2020 1511 by Myriam Forehand, RN Outcome: Adequate for Discharge 09/16/2020 1510 by Myriam Forehand, RN Outcome: Adequate for Discharge 09/16/2020 1510 by Myriam Forehand, RN Outcome: Adequate for Discharge 09/16/2020 1510 by Myriam Forehand, RN Outcome: Adequate for Discharge 09/16/2020 1509 by Myriam Forehand, RN Outcome: Adequate for Discharge   Problem: Coping: Goal: Level of anxiety will decrease 09/16/2020 1511 by Myriam Forehand, RN Outcome: Adequate for Discharge 09/16/2020 1510 by Myriam Forehand, RN Outcome: Adequate for Discharge 09/16/2020 1510 by Myriam Forehand, RN Outcome: Adequate for Discharge 09/16/2020 1510 by Myriam Forehand, RN Outcome: Adequate for Discharge 09/16/2020 1509 by  Myriam Forehand, RN Outcome: Adequate for Discharge   Problem: Elimination: Goal: Will not experience complications related to bowel motility 09/16/2020 1511 by Myriam Forehand, RN Outcome: Adequate for Discharge 09/16/2020 1510 by Myriam Forehand, RN Outcome: Adequate for Discharge 09/16/2020 1510 by Myriam Forehand, RN Outcome: Adequate for Discharge 09/16/2020 1510 by Myriam Forehand, RN Outcome: Adequate for Discharge 09/16/2020 1509 by Myriam Forehand, RN Outcome: Adequate for Discharge Goal: Will not experience complications related to urinary retention 09/16/2020 1511 by Myriam Forehand, RN Outcome: Adequate for Discharge 09/16/2020 1510 by Myriam Forehand, RN Outcome: Adequate for Discharge 09/16/2020 1510 by Myriam Forehand, RN Outcome: Adequate for Discharge 09/16/2020 1510 by Myriam Forehand, RN Outcome: Adequate for Discharge 09/16/2020 1509 by Myriam Forehand, RN Outcome: Adequate for Discharge   Problem: Pain Managment: Goal: General experience of comfort will improve 09/16/2020 1511 by Myriam Forehand, RN Outcome: Adequate for Discharge 09/16/2020 1510 by Myriam Forehand, RN Outcome: Adequate for Discharge 09/16/2020 1510 by Myriam Forehand, RN Outcome: Adequate for Discharge 09/16/2020 1510 by Myriam Forehand, RN Outcome: Adequate for Discharge 09/16/2020 1509 by Myriam Forehand, RN Outcome: Adequate for Discharge   Problem: Safety: Goal: Ability to remain free from injury will improve 09/16/2020 1511 by Myriam Forehand, RN Outcome: Adequate for Discharge 09/16/2020 1510 by Myriam Forehand,  RN Outcome: Adequate for Discharge 09/16/2020 1510 by Myriam Forehand, RN Outcome: Adequate for Discharge 09/16/2020 1510 by Myriam Forehand, RN Outcome: Adequate for Discharge 09/16/2020 1509 by Myriam Forehand, RN Outcome: Adequate for Discharge   Problem: Skin Integrity: Goal: Risk for impaired skin integrity will decrease 09/16/2020 1511 by Myriam Forehand, RN Outcome: Adequate for Discharge 09/16/2020 1510 by Myriam Forehand, RN Outcome: Adequate for Discharge 09/16/2020 1510 by Myriam Forehand, RN Outcome: Adequate for Discharge 09/16/2020 1510 by Myriam Forehand, RN Outcome: Adequate for Discharge 09/16/2020 1509 by Myriam Forehand, RN Outcome: Adequate for Discharge

## 2020-09-16 NOTE — Plan of Care (Signed)

## 2020-09-16 NOTE — Progress Notes (Signed)
STROKE TEAM PROGRESS NOTE   INTERVAL HISTORY His wife is at the bedside.  He is currently undergoing a 2D echo in the room.  I personally reviewed history of presenting illness with the patient and his wife, electronic medical records and imaging films in PACS.  He presented with a right hand fourth and fifth digit numbness and weakness which appears to be improving an MRI scan shows embolic left frontal MCA branch infarct.  Patient is currently participating in the C LE AR cholesterol study for cardiac prevention and is on bempedoic acid 180 mg/day and nonstatin alternative versus placebo.  He had already talked to his cardiologist Dr. Martinique about stopping the study and is here found out recently that his cholesterol was suboptimal.  He has history of statin intolerance Vitals:   09/15/20 2123 09/15/20 2318 09/16/20 0359 09/16/20 0738  BP: (!) 158/80 (!) 162/91 (!) 133/91 130/84  Pulse: 68 60 66 64  Resp: 18 18 20 18   Temp: 97.8 F (36.6 C) 97.6 F (36.4 C) 97.8 F (36.6 C) 98 F (36.7 C)  TempSrc: Oral Oral Oral Oral  SpO2: 99% 97% 97% 96%   CBC:  Recent Labs  Lab 09/15/20 1131  WBC 6.7  NEUTROABS 5.0  HGB 16.7  HCT 49.1  MCV 88.8  PLT 831   Basic Metabolic Panel:  Recent Labs  Lab 09/15/20 1131  NA 141  K 4.3  CL 105  CO2 27  GLUCOSE 98  BUN 14  CREATININE 1.20  CALCIUM 9.3   Lipid Panel:  Recent Labs  Lab 09/16/20 0428  CHOL 208*  TRIG 238*  HDL 27*  CHOLHDL 7.7  VLDL 48*  LDLCALC 133*   HgbA1c: No results for input(s): HGBA1C in the last 168 hours.  Urine Drug Screen:  Recent Labs  Lab 09/15/20 1131  LABOPIA NONE DETECTED  COCAINSCRNUR NONE DETECTED  LABBENZ NONE DETECTED  AMPHETMU NONE DETECTED  THCU NONE DETECTED  LABBARB NONE DETECTED    Alcohol Level  Recent Labs  Lab 09/15/20 1131  ETH <10    IMAGING past 24 hours CT Angio Head W or Wo Contrast  Result Date: 09/15/2020 CLINICAL DATA:  Right arm numbness.  Symptoms began 2-3 hours  ago. EXAM: CT ANGIOGRAPHY HEAD AND NECK TECHNIQUE: Multidetector CT imaging of the head and neck was performed using the standard protocol during bolus administration of intravenous contrast. Multiplanar CT image reconstructions and MIPs were obtained to evaluate the vascular anatomy. Carotid stenosis measurements (when applicable) are obtained utilizing NASCET criteria, using the distal internal carotid diameter as the denominator. CONTRAST:  175mL OMNIPAQUE IOHEXOL 350 MG/ML SOLN COMPARISON:  None. FINDINGS: CT HEAD FINDINGS Brain: Mild age related volume loss. No sign of acute infarction, mass lesion, hemorrhage, hydrocephalus or extra-axial collection. Vascular: There is atherosclerotic calcification of the major vessels at the base of the brain. Skull: Negative Sinuses: Clear Orbits: Normal Review of the MIP images confirms the above findings CTA NECK FINDINGS Aortic arch: Aortic atherosclerosis. Branching pattern is normal. No origin stenosis. Right carotid system: Innominate artery is widely patent. Common carotid artery is widely patent, with a few areas of nonstenotic plaque. At the carotid bifurcation and ICA bulb, there is advanced irregular soft and calcified plaque. Pronounced irregularity could serve as a source of embolic disease. Minimal diameter throughout the region measures 3.5 mm. Compared to a more distal cervical ICA diameter of 5 mm, this indicates a 30% stenosis. Left carotid system: Common carotid artery shows some atherosclerotic plaque  but no stenosis proximal to the bifurcation region. At the carotid bifurcation and ICA bulb, there is soft and calcified plaque. As seen on the right, there is considerable irregularity which could serve as a source of embolic disease. Minimal diameter of 3.5 mm indicates a stenosis of 30%. Cervical ICA widely patent beyond that. Vertebral arteries: Soft and calcified plaque at the proximal right subclavian artery with 50% stenosis. Atherosclerotic plaque at  the right vertebral artery origin with stenosis of 30%. Beyond that, the vessel is widely patent through the cervical region to the foramen magnum. Left subclavian artery shows some atherosclerotic change but no measurable stenosis. 50% stenosis of the left vertebral artery origin. Beyond that, the vessel is widely patent through the cervical region. Skeleton: Ordinary cervical spondylosis and facet osteoarthritis. Other neck: No mass or lymphadenopathy. Upper chest: Negative Review of the MIP images confirms the above findings CTA HEAD FINDINGS Anterior circulation: Both internal carotid arteries are widely patent through the skull base and siphon regions. Ordinary siphon atherosclerosis without stenosis greater than 30%. The anterior and middle cerebral vessels are patent without large or medium vessel occlusion or correctable proximal stenosis. Posterior circulation: Both vertebral arteries are patent through the foramen magnum to the basilar. Narrowing of the mid to distal basilar artery with atherosclerotic irregularity. Flow is present in both superior cerebellar and posterior cerebral arteries. Right PCA receives most of it supply from the anterior circulation. Venous sinuses: Patent and normal. Anatomic variants: None significant. Review of the MIP images confirms the above findings IMPRESSION: 1. Advanced atherosclerotic disease at both carotid bifurcations and ICA bulbs. 30% stenosis of the internal carotid arteries on both sides. Pronounced irregularity could serve as a source of embolic disease. 2. 50% stenosis of the proximal right subclavian artery. 30% stenosis of the right vertebral artery origin. 50% stenosis of the left vertebral artery origin. Narrowing of the mid to distal basilar artery with atherosclerotic irregularity. Flow is present in both superior cerebellar and posterior cerebral arteries. 3. No intracranial large or medium vessel occlusion or correctable proximal stenosis. 4. No acute  brain CT finding. Aortic Atherosclerosis (ICD10-I70.0). Electronically Signed   By: Nelson Chimes M.D.   On: 09/15/2020 12:13   DG Chest 2 View  Result Date: 09/15/2020 CLINICAL DATA:  Stroke, right-sided weakness EXAM: CHEST - 2 VIEW COMPARISON:  None. FINDINGS: Lungs are well expanded, symmetric, and clear. No pneumothorax or pleural effusion. Cardiac size within normal limits. Coronary artery stenting has been performed. Pulmonary vascularity is normal. Osseous structures are age-appropriate. No acute bone abnormality. IMPRESSION: No active cardiopulmonary disease. Electronically Signed   By: Fidela Salisbury MD   On: 09/15/2020 20:30   CT Angio Neck W and/or Wo Contrast  Result Date: 09/15/2020 CLINICAL DATA:  Right arm numbness.  Symptoms began 2-3 hours ago. EXAM: CT ANGIOGRAPHY HEAD AND NECK TECHNIQUE: Multidetector CT imaging of the head and neck was performed using the standard protocol during bolus administration of intravenous contrast. Multiplanar CT image reconstructions and MIPs were obtained to evaluate the vascular anatomy. Carotid stenosis measurements (when applicable) are obtained utilizing NASCET criteria, using the distal internal carotid diameter as the denominator. CONTRAST:  172mL OMNIPAQUE IOHEXOL 350 MG/ML SOLN COMPARISON:  None. FINDINGS: CT HEAD FINDINGS Brain: Mild age related volume loss. No sign of acute infarction, mass lesion, hemorrhage, hydrocephalus or extra-axial collection. Vascular: There is atherosclerotic calcification of the major vessels at the base of the brain. Skull: Negative Sinuses: Clear Orbits: Normal Review of the MIP images  confirms the above findings CTA NECK FINDINGS Aortic arch: Aortic atherosclerosis. Branching pattern is normal. No origin stenosis. Right carotid system: Innominate artery is widely patent. Common carotid artery is widely patent, with a few areas of nonstenotic plaque. At the carotid bifurcation and ICA bulb, there is advanced irregular  soft and calcified plaque. Pronounced irregularity could serve as a source of embolic disease. Minimal diameter throughout the region measures 3.5 mm. Compared to a more distal cervical ICA diameter of 5 mm, this indicates a 30% stenosis. Left carotid system: Common carotid artery shows some atherosclerotic plaque but no stenosis proximal to the bifurcation region. At the carotid bifurcation and ICA bulb, there is soft and calcified plaque. As seen on the right, there is considerable irregularity which could serve as a source of embolic disease. Minimal diameter of 3.5 mm indicates a stenosis of 30%. Cervical ICA widely patent beyond that. Vertebral arteries: Soft and calcified plaque at the proximal right subclavian artery with 50% stenosis. Atherosclerotic plaque at the right vertebral artery origin with stenosis of 30%. Beyond that, the vessel is widely patent through the cervical region to the foramen magnum. Left subclavian artery shows some atherosclerotic change but no measurable stenosis. 50% stenosis of the left vertebral artery origin. Beyond that, the vessel is widely patent through the cervical region. Skeleton: Ordinary cervical spondylosis and facet osteoarthritis. Other neck: No mass or lymphadenopathy. Upper chest: Negative Review of the MIP images confirms the above findings CTA HEAD FINDINGS Anterior circulation: Both internal carotid arteries are widely patent through the skull base and siphon regions. Ordinary siphon atherosclerosis without stenosis greater than 30%. The anterior and middle cerebral vessels are patent without large or medium vessel occlusion or correctable proximal stenosis. Posterior circulation: Both vertebral arteries are patent through the foramen magnum to the basilar. Narrowing of the mid to distal basilar artery with atherosclerotic irregularity. Flow is present in both superior cerebellar and posterior cerebral arteries. Right PCA receives most of it supply from the  anterior circulation. Venous sinuses: Patent and normal. Anatomic variants: None significant. Review of the MIP images confirms the above findings IMPRESSION: 1. Advanced atherosclerotic disease at both carotid bifurcations and ICA bulbs. 30% stenosis of the internal carotid arteries on both sides. Pronounced irregularity could serve as a source of embolic disease. 2. 50% stenosis of the proximal right subclavian artery. 30% stenosis of the right vertebral artery origin. 50% stenosis of the left vertebral artery origin. Narrowing of the mid to distal basilar artery with atherosclerotic irregularity. Flow is present in both superior cerebellar and posterior cerebral arteries. 3. No intracranial large or medium vessel occlusion or correctable proximal stenosis. 4. No acute brain CT finding. Aortic Atherosclerosis (ICD10-I70.0). Electronically Signed   By: Nelson Chimes M.D.   On: 09/15/2020 12:13   MR BRAIN WO CONTRAST  Result Date: 09/16/2020 CLINICAL DATA:  Left hand weakness. Numbness in the fourth and fifth digits of the hand. EXAM: MRI HEAD WITHOUT CONTRAST TECHNIQUE: Multiplanar, multiecho pulse sequences of the brain and surrounding structures were obtained without intravenous contrast. COMPARISON:  CTA head and neck 09/15/2020 FINDINGS: Brain: Punctate areas of acute nonhemorrhagic infarct involves the left primary motor cortex and left subcortical parietal white matter. Hemorrhage or mass lesion is present. Minimal white matter changes are otherwise within normal limits for age. The ventricles are of normal size. No significant extraaxial fluid collection is present. The internal auditory canals are within normal limits. The brainstem and cerebellum are within normal limits. Vascular: Flow is present  in the major intracranial arteries. Skull and upper cervical spine: The craniocervical junction is normal. Upper cervical spine is within normal limits. Marrow signal is unremarkable. Sinuses/Orbits: The  paranasal sinuses and mastoid air cells are clear. The globes and orbits are within normal limits. IMPRESSION: 1. Punctate areas of acute nonhemorrhagic infarct involving the left primary motor cortex and left subcortical parietal white matter. The above was relayed via text pager to Dr. Leonel Ramsay On 09/16/2020 at 06:01 . Electronically Signed   By: San Morelle M.D.   On: 09/16/2020 06:01   MR CERVICAL SPINE WO CONTRAST  Result Date: 09/16/2020 CLINICAL DATA:  Right hand weakness and numbness.  Field EXAM: MRI CERVICAL SPINE WITHOUT CONTRAST TECHNIQUE: Multiplanar, multisequence MR imaging of the cervical spine was performed. No intravenous contrast was administered. COMPARISON:  CTA of the neck 10/13/1 FINDINGS: Alignment: No significant listhesis is present. Mild straightening of the cervical lordosis is stable. Vertebrae: Marrow signal and vertebral body heights are normal. Cord: Normal signal is present in the cervical and upper thoracic spinal cord to the lowest imaged level T3-4. Posterior Fossa, vertebral arteries, paraspinal tissues: Craniocervical junction is normal. Disc levels: C2-3: Negative. C3-4: Facet and uncovertebral spurring results in mild left foraminal narrowing. C4-5: Uncovertebral and facet hypertrophy is present bilaterally. Moderate foraminal narrowing is worse on the left. The central canal is patent. C5-6: Uncovertebral and facet disease leads to severe left and moderate right foraminal narrowing. C6-7: Uncovertebral and facet spurring leads to severe foraminal narrowing bilaterally. The central canal is patent. C7-T1: Asymmetric left-sided facet hypertrophy contributes to moderate left foraminal narrowing. IMPRESSION: 1. Multilevel spondylosis of the cervical spine as described. 2. Moderate foraminal narrowing bilaterally at C4-5 is worse on the left. 3. Severe left and moderate right foraminal stenosis at C5-6. 4. Severe foraminal narrowing bilaterally at C6-7. 5.  Moderate left foraminal narrowing at C7-T1. Electronically Signed   By: San Morelle M.D.   On: 09/16/2020 06:23    PHYSICAL EXAM Pleasant elderly Caucasian male not in distress. . Afebrile. Head is nontraumatic. Neck is supple without bruit.    Cardiac exam no murmur or gallop. Lungs are clear to auscultation. Distal pulses are well felt. Neurological Exam ;  Awake  Alert oriented x 3. Normal speech and language.eye movements full without nystagmus.fundi were not visualized. Vision acuity and fields appear normal. Hearing is normal. Palatal movements are normal. Face symmetric. Tongue midline. Normal strength, tone, reflexes and coordination except mild weakness of right hand fourth and fifth digits with subjective paresthesias there.. Normal sensation. Gait deferred. ASSESSMENT/PLAN Mr. Brandon Gonzales is a 69 y.o. male with history of HTN, HLD, CAD, hypertrophic cardiomyopathy, cervical disc dz, arthritis presenting to [place] with R hand numbness and weakness.   Stroke:   Multiple L MCA infarcts embolic secondary to unknown source  CTA head & neck advanced atherosclerosis B ICA bifurcation and bulbs. B ICA 30% stenosis w/ pronounced irregularity. Proximal R Subclavian 50%. R VA origin 30%. L VA origin 50%. Mid to distal BA. No LVO.  MRI  Punctate L primary motor cortec and L subcortical parietal white matter infarcts  MR CS multilevel CX spondylosis w/ moderate and severe foraminal stenosis / narrowing C4-11  2D Echo pending   Consulted EP to consider implantable loop recorder - prior cardiology / EP notes discuss ICD. EP to discuss and make follow up plan. Will defer decision to them.  LDL 133  HgbA1c pending   VTE prophylaxis - Lovenox 40  mg sq daily   aspirin 81 mg daily prior to admission, now on aspirin 81 mg daily and clopidogrel 75 mg daily. Continue DAPT x 3 weeks then plavix alone. Orders placed.   Therapy recommendations:  OP OT  Disposition:   pending   Hypertension  Stable . Permissive hypertension (OK if < 220/120) but gradually normalize in 5-7 days . Long-term BP goal normotensive  Hyperlipidemia  Home meds:  None  Intolerant to statins (leg cramps)  LDL 133, goal < 70  Enrolled in CLEAR Study w/o change in labs, Dr. Martinique recommends he end the study and take  PCS-K injections. Stroke team agreeable.    Other Stroke Risk Factors  Advanced age  Coronary artery disease s/p stent 2007, STEMI 2013  Other Active Problems  Hypertrophic cardiomyopathy, plans for ICD per Dr. Martinique and [facility] day # 1 He has presented with embolic left MCA branch infarct with strong suspicion for paroxysmal A. fib.  Patient will benefit with long-term monitoring with loop recorder but if plans are to put in an ICD soon for his hypertrophic cardiomyopathy this can wait and instead do 30-day heart monitor.  Recommend patient discontinue participation in the cholesterol clear study and instead start Praluent or Repatha injections after discussion with his cardiologist Dr. Martinique.  Recommend aspirin Plavix for 3 weeks followed by Plavix alone.  Aggressive risk factor modification.  Discussed with patient and wife and Dr.Pahwani.  Greater than 50% time during the 35-minute visit was spent on counseling and coordination of care about embolic stroke and discussion about medication for cholesterol and answering questions. Antony Contras, MD  To contact Stroke Continuity provider, please refer to [email or web address] After hours, contact General Neurology

## 2020-09-16 NOTE — Care Management CC44 (Signed)
Condition Code 44 Documentation Completed  Patient Details  Name: Brandon Gonzales MRN: [id number] Date of Birth: Nov 09, 1951   Condition Code 44 given:  Yes Patient signature on Condition Code 44 notice:  Yes Documentation of 2 MD's agreement:  Yes Code 44 added to claim:  Yes    Angelita Ingles, RN 09/16/2020, 3:23 PM

## 2020-09-16 NOTE — Plan of Care (Signed)
Problem: Education: Goal: Knowledge of General Education information will improve Description: Including pain rating scale, medication(s)/side effects and non-pharmacologic comfort measures 09/16/2020 1510 by Myriam Forehand, RN Outcome: Adequate for Discharge 09/16/2020 1510 by Myriam Forehand, RN Outcome: Adequate for Discharge 09/16/2020 1509 by Myriam Forehand, RN Outcome: Adequate for Discharge   Problem: Health Behavior/Discharge Planning: Goal: Ability to manage health-related needs will improve 09/16/2020 1510 by Myriam Forehand, RN Outcome: Adequate for Discharge 09/16/2020 1510 by Myriam Forehand, RN Outcome: Adequate for Discharge 09/16/2020 1509 by Myriam Forehand, RN Outcome: Adequate for Discharge   Problem: Clinical Measurements: Goal: Ability to maintain clinical measurements within normal limits will improve 09/16/2020 1510 by Myriam Forehand, RN Outcome: Adequate for Discharge 09/16/2020 1510 by Myriam Forehand, RN Outcome: Adequate for Discharge 09/16/2020 1509 by Myriam Forehand, RN Outcome: Adequate for Discharge Goal: Will remain free from infection 09/16/2020 1510 by Myriam Forehand, RN Outcome: Adequate for Discharge 09/16/2020 1510 by Myriam Forehand, RN Outcome: Adequate for Discharge 09/16/2020 1509 by Myriam Forehand, RN Outcome: Adequate for Discharge Goal: Diagnostic test results will improve 09/16/2020 1510 by Myriam Forehand, RN Outcome: Adequate for Discharge 09/16/2020 1510 by Myriam Forehand, RN Outcome: Adequate for Discharge 09/16/2020 1509 by Myriam Forehand, RN Outcome: Adequate for Discharge Goal: Respiratory complications will improve 09/16/2020 1510 by Myriam Forehand, RN Outcome: Adequate for Discharge 09/16/2020 1510 by Myriam Forehand, RN Outcome: Adequate for Discharge 09/16/2020 1509 by Myriam Forehand, RN Outcome: Adequate for Discharge Goal: Cardiovascular complication will be avoided 09/16/2020 1510 by Myriam Forehand, RN Outcome: Adequate for  Discharge 09/16/2020 1510 by Myriam Forehand, RN Outcome: Adequate for Discharge 09/16/2020 1509 by Myriam Forehand, RN Outcome: Adequate for Discharge   Problem: Activity: Goal: Risk for activity intolerance will decrease 09/16/2020 1510 by Myriam Forehand, RN Outcome: Adequate for Discharge 09/16/2020 1510 by Myriam Forehand, RN Outcome: Adequate for Discharge 09/16/2020 1509 by Myriam Forehand, RN Outcome: Adequate for Discharge   Problem: Nutrition: Goal: Adequate nutrition will be maintained 09/16/2020 1510 by Myriam Forehand, RN Outcome: Adequate for Discharge 09/16/2020 1510 by Myriam Forehand, RN Outcome: Adequate for Discharge 09/16/2020 1509 by Myriam Forehand, RN Outcome: Adequate for Discharge   Problem: Coping: Goal: Level of anxiety will decrease 09/16/2020 1510 by Myriam Forehand, RN Outcome: Adequate for Discharge 09/16/2020 1510 by Myriam Forehand, RN Outcome: Adequate for Discharge 09/16/2020 1509 by Myriam Forehand, RN Outcome: Adequate for Discharge   Problem: Elimination: Goal: Will not experience complications related to bowel motility 09/16/2020 1510 by Myriam Forehand, RN Outcome: Adequate for Discharge 09/16/2020 1510 by Myriam Forehand, RN Outcome: Adequate for Discharge 09/16/2020 1509 by Myriam Forehand, RN Outcome: Adequate for Discharge Goal: Will not experience complications related to urinary retention 09/16/2020 1510 by Myriam Forehand, RN Outcome: Adequate for Discharge 09/16/2020 1510 by Myriam Forehand, RN Outcome: Adequate for Discharge 09/16/2020 1509 by Myriam Forehand, RN Outcome: Adequate for Discharge   Problem: Pain Managment: Goal: General experience of comfort will improve 09/16/2020 1510 by Myriam Forehand, RN Outcome: Adequate for Discharge 09/16/2020 1510 by Myriam Forehand, RN Outcome: Adequate for Discharge 09/16/2020 1509 by Myriam Forehand, RN Outcome: Adequate for Discharge   Problem: Safety: Goal: Ability to remain free from injury will  improve 09/16/2020 1510 by Myriam Forehand, RN Outcome: Adequate for Discharge 09/16/2020 1510 by Myriam Forehand, RN Outcome: Adequate for Discharge 09/16/2020 1509 by Myriam Forehand, RN Outcome: Adequate for Discharge   Problem: Skin Integrity: Goal: Risk for impaired skin integrity will decrease 09/16/2020 1510 by Myriam Forehand, RN Outcome: Adequate for Discharge 09/16/2020 1510 by  Myriam Forehand, RN Outcome: Adequate for Discharge 09/16/2020 1509 by Myriam Forehand, RN Outcome: Adequate for Discharge

## 2020-09-16 NOTE — Progress Notes (Signed)
Occupational Therapy Evaluation Patient Details Name: Brandon Gonzales MRN: [id number] DOB: 08/14/1951 Today's Date: 09/16/2020    History of Present Illness 69 y.o. male presenting with c/p RUE numbness and weakness in 4th/5th digits. CT (-).  Concern for suspected acute ischemic CVA. PMHx significant for CAD s/p PCI and stents, HOCM EF 55% AICD 4/21, HLD, HTN, and severe static intolerance.   Clinical Impression   PTA patient was independent with ADLs with shared responsibility for IADLs. Patient was still driving. Patient currently presents at baseline level of function for ADLs, bed mobility and functional transfers without AD. Patient does demonstrate weakness in RUE with greatest deficit in strength and coordination at 4th and 5th digit digits. Patient would benefit from OPOT follow-up for RUE NMR. Patient does not currently required continued acute occupational therapy services with OT to sign off at this time.      Follow Up Recommendations  Outpatient OT    Equipment Recommendations  None recommended by OT    Recommendations for Other Services       Precautions / Restrictions Precautions Precautions: None Restrictions Weight Bearing Restrictions: No      Mobility Bed Mobility Overal bed mobility: Independent                Transfers Overall transfer level: Independent Equipment used: None                  Balance Overall balance assessment: No apparent balance deficits (not formally assessed)                                         ADL either performed or assessed with clinical judgement   ADL Overall ADL's : At baseline                                             Vision Baseline Vision/History: Wears glasses Wears Glasses: At all times Patient Visual Report: No change from baseline Vision Assessment?: No apparent visual deficits     Perception     Praxis      Pertinent Vitals/Pain Pain Assessment:  No/denies pain     Hand Dominance Right   Extremity/Trunk Assessment Upper Extremity Assessment Upper Extremity Assessment: RUE deficits/detail RUE Sensation: decreased light touch RUE Coordination: decreased fine motor   Lower Extremity Assessment Lower Extremity Assessment: Defer to PT evaluation   Cervical / Trunk Assessment Cervical / Trunk Assessment: Normal   Communication Communication Communication: No difficulties   Cognition Arousal/Alertness: Awake/alert Behavior During Therapy: WFL for tasks assessed/performed Overall Cognitive Status: Within Functional Limits for tasks assessed                                     General Comments  VSS on RA    Exercises     Shoulder Instructions      Home Living Family/patient expects to be discharged to:: Private residence Living Arrangements: Spouse/significant other Available Help at Discharge: Family;Available 24 hours/day Type of Home: House Home Access: Level entry     Home Layout: Multi-level Alternate Level Stairs-Number of Steps: flight to get to second floor with R ascending rail; flight to go down to basement with R descending rail  Bathroom Shower/Tub: Occupational psychologist: None          Prior Functioning/Environment Level of Independence: Independent        Comments: 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 [place]; is more on the computer side/in the background        OT Problem List: Impaired UE functional use      OT Treatment/Interventions:      OT Goals(Current goals can be found in the care plan section) Acute Rehab OT Goals Patient Stated Goal: To return home OT Goal Formulation: With patient Time For Goal Achievement: 09/30/20 Potential to Achieve Goals: Good  OT Frequency:     Barriers to D/C:            Co-evaluation              AM-PAC OT "6 Clicks" Daily Activity      Outcome Measure Help from another person eating meals?: None Help from another person taking care of personal grooming?: None Help from another person toileting, which includes using toliet, bedpan, or urinal?: None Help from another person bathing (including washing, rinsing, drying)?: None Help from another person to put on and taking off regular upper body clothing?: None Help from another person to put on and taking off regular lower body clothing?: None 6 Click Score: 24   End of Session Nurse Communication: Mobility status  Activity Tolerance: Patient tolerated treatment well Patient left: in bed;with call bell/phone within reach;with family/visitor present                   Time: 1130-1145 OT Time Calculation (min): 15 min Charges:  OT General Charges $OT Visit: 1 Visit OT Evaluation $OT Eval Low Complexity: 1 Low  Aashish Hamm H. OTR/L Supplemental OT, Department of rehab services [phone]  Nayab Aten R H. 09/16/2020, 11:41 AM

## 2020-09-16 NOTE — Progress Notes (Signed)
PROGRESS NOTE    Brandon Gonzales  CWC:[id number] DOB: 21-[name]-1951 DOA: 09/15/2020 PCP: [name], NP   Brief Narrative:  HPI: Brandon Gonzales is a 69 y.o. male with medical history significant of CAD s/p PCI and stents, HOCM EF 55% AICD recd in April 21 but deferred by pt for now, HLD with severe statin intolerance, HTN.  Patient presents to the ED at [facility] with c/o R arm numbness and R 4th and 5th finger weakness.  Symptoms onset at 845am while in the shower.  No associated facial droop.  Symptoms are persistent and unchanged since onset, nothing makes better or worse.  No CP, no palpitations, no lightheadedness, dizziness, syncope, weakness.  ED Course: CTA head and neck showed advanced atherosclerotic dz at bilateral carotid bifurcations, 30% stenosis ICA both sides but pronounced plaque irregularity that could serve as source of embolic dz.  No large vessel intracranial occlusion.  No acute brain CT finding.  Pt transferred to [facility] for stroke work up.  Assessment & Plan:   Principal Problem:   Acute ischemic stroke [facility]) Active Problems:   Dyslipidemia   Hypertrophic cardiomyopathy (HCC)   Essential hypertension   CAD S/P percutaneous coronary angioplasty   Acute ischemic stroke: Multiple left MCA infarcts embolic secondary to unknown source.CTA head & neck advanced atherosclerosis B ICA bifurcation and bulbs. B ICA 30% stenosis w/ pronounced irregularity. Proximal R Subclavian 50%. R VA origin 30%. L VA origin 50%. Mid to distal BA. No LVO.  MRI  Punctate L primary motor cortec and L subcortical parietal white matter infarcts  MR CS multilevel CX spondylosis w/ moderate and severe foraminal stenosis / narrowing C4-11  2D Echo pending.  Remains on aspirin and Plavix.  Seen by PT OT, they recommend outpatient OT.  EP has been consulted by neurology for consideration of implantable loop recorder.  Patient in the past has been considered for ICD.  Looks like patient  may not opt for ICD.  Per my discussion with Dr. Leonie Man, he has personally spoken to EP and has recommended that patient goes out the hospital with some sort of monitoring, either loop recorder or event recorder if he does not get ICD.  Hyperlipidemia: Elevated LDL and low HDL.  Per my discussion with neurology, patient is currently in a trial for new drug for hyperlipidemia but his lipids have not changed.  Patient is intolerant to statin.  Dr. Willaim Rayas has talked to his cardiologist as outpatient and they will take care of getting Repatha for him.  Essential hypertension: Stable.  Permissive hypertension.  BP within normal range without being on any antihypertensives.  HOCM: Cardiology on board.  DVT prophylaxis: enoxaparin (LOVENOX) injection 40 mg Start: 09/15/20 2030   Code Status: Full Code  Family Communication:  None present at bedside.  Plan of care discussed with patient in length and he verbalized understanding and agreed with it.  Status is: Inpatient  Remains inpatient appropriate because:Inpatient level of care appropriate due to severity of illness   Dispo: The patient is from: Home              Anticipated d/c is to: Home              Anticipated d/c date is: 1 day              Patient currently is not medically stable to d/c.        Estimated body mass index is 25.86 kg/m as calculated from the  following:   Height as of 09/13/20: 5' 8.5" (1.74 m).   Weight as of 09/13/20: 78.3 kg.      Nutritional status:               Consultants:   Cardiology and neurology  Procedures:   None  Antimicrobials:  Anti-infectives (From admission, onward)   None         Subjective: Patient seen and examined this morning.  He stated that he feels much better but he still had weakness and decreased sensation in the fourth and fifth digit of the right hand.  No other complaint.  Objective: Vitals:   09/15/20 2318 09/16/20 0359 09/16/20 0738 09/16/20 1201    BP: (!) 162/91 (!) 133/91 130/84 133/88  Pulse: 60 66 64 81  Resp: 18 20 18 18   Temp:  97.8 F (36.6 C) 98 F (36.7 C) 98.3 F (36.8 C)  TempSrc: Oral Oral Oral Oral  SpO2: 97% 97% 96% 97%    Intake/Output Summary (Last 24 hours) at 09/16/2020 1347 Last data filed at 09/16/2020 4854 Gross per 24 hour  Intake 360 ml  Output --  Net 360 ml   There were no vitals filed for this visit.  Examination:  General exam: Appears calm and comfortable  Respiratory system: Clear to auscultation. Respiratory effort normal. Cardiovascular system: S1 & S2 heard, RRR. No JVD, murmurs, rubs, gallops or clicks. No pedal edema. Gastrointestinal system: Abdomen is nondistended, soft and nontender. No organomegaly or masses felt. Normal bowel sounds heard. Central nervous system: Alert and oriented. No focal neurological deficits. Extremities: Symmetric 5 x 5 power. Skin: No rashes, lesions or ulcers Psychiatry: Judgement and insight appear normal. Mood & affect appropriate.    Data Reviewed: I have personally reviewed following labs and imaging studies  CBC: Recent Labs  Lab 09/15/20 1131  WBC 6.7  NEUTROABS 5.0  HGB 16.7  HCT 49.1  MCV 88.8  PLT 627   Basic Metabolic Panel: Recent Labs  Lab 09/15/20 1131  NA 141  K 4.3  CL 105  CO2 27  GLUCOSE 98  BUN 14  CREATININE 1.20  CALCIUM 9.3   GFR: Estimated Creatinine Clearance: 55.6 mL/min (by C-G formula based on SCr of 1.2 mg/dL). Liver Function Tests: Recent Labs  Lab 09/15/20 1131  AST 25  ALT 25  ALKPHOS 52  BILITOT 1.9*  PROT 6.9  ALBUMIN 4.5   No results for input(s): LIPASE, AMYLASE in the last 168 hours. No results for input(s): AMMONIA in the last 168 hours. Coagulation Profile: Recent Labs  Lab 09/15/20 1153  INR 1.1   Cardiac Enzymes: No results for input(s): CKTOTAL, CKMB, CKMBINDEX, TROPONINI in the last 168 hours. BNP (last 3 results) No results for input(s): PROBNP in the last 8760  hours. HbA1C: No results for input(s): HGBA1C in the last 72 hours. CBG: Recent Labs  Lab 09/15/20 1124  GLUCAP 94   Lipid Profile: Recent Labs    09/16/20 0428  CHOL 208*  HDL 27*  LDLCALC 133*  TRIG 238*  CHOLHDL 7.7   Thyroid Function Tests: No results for input(s): TSH, T4TOTAL, FREET4, T3FREE, THYROIDAB in the last 72 hours. Anemia Panel: No results for input(s): VITAMINB12, FOLATE, FERRITIN, TIBC, IRON, RETICCTPCT in the last 72 hours. Sepsis Labs: No results for input(s): PROCALCITON, LATICACIDVEN in the last 168 hours.  Recent Results (from the past 240 hour(s))  Respiratory Panel by RT PCR (Flu A&B, Covid) - Nasopharyngeal Swab     Status:  None   Collection Time: 09/15/20 11:52 AM   Specimen: Nasopharyngeal Swab  Result Value Ref Range Status   SARS Coronavirus 2 by RT PCR NEGATIVE NEGATIVE Final    Comment: (NOTE) SARS-CoV-2 target nucleic acids are NOT DETECTED.  The SARS-CoV-2 RNA is generally detectable in upper respiratoy specimens during the acute phase of infection. The lowest concentration of SARS-CoV-2 viral copies this assay can detect is 131 copies/mL. A negative result does not preclude SARS-Cov-2 infection and should not be used as the sole basis for treatment or other patient management decisions. A negative result may occur with  improper specimen collection/handling, submission of specimen other than nasopharyngeal swab, presence of viral mutation(s) within the areas targeted by this assay, and inadequate number of viral copies (<131 copies/mL). A negative result must be combined with clinical observations, patient history, and epidemiological information. The expected result is Negative.  Fact Sheet for Patients:  PinkCheek.be  Fact Sheet for Healthcare Providers:  GravelBags.it  This test is no t yet approved or cleared by the [place] FDA and  has been authorized for  detection and/or diagnosis of SARS-CoV-2 by FDA under an Emergency Use Authorization (EUA). This EUA will remain  in effect (meaning this test can be used) for the duration of the COVID-19 declaration under Section 564(b)(1) of the Act, 21 U.S.C. section 360bbb-3(b)(1), unless the authorization is terminated or revoked sooner.     Influenza A by PCR NEGATIVE NEGATIVE Final   Influenza B by PCR NEGATIVE NEGATIVE Final    Comment: (NOTE) The Xpert Xpress SARS-CoV-2/FLU/RSV assay is intended as an aid in  the diagnosis of influenza from Nasopharyngeal swab specimens and  should not be used as a sole basis for treatment. Nasal washings and  aspirates are unacceptable for Xpert Xpress SARS-CoV-2/FLU/RSV  testing.  Fact Sheet for Patients: PinkCheek.be  Fact Sheet for Healthcare Providers: GravelBags.it  This test is not yet approved or cleared by the [place] FDA and  has been authorized for detection and/or diagnosis of SARS-CoV-2 by  FDA under an Emergency Use Authorization (EUA). This EUA will remain  in effect (meaning this test can be used) for the duration of the  Covid-19 declaration under Section 564(b)(1) of the Act, 21  U.S.C. section 360bbb-3(b)(1), unless the authorization is  terminated or revoked. Performed at [facility], [address] [place], [place] [address]       Radiology Studies: CT Angio Head W or Wo Contrast  Result Date: 09/15/2020 CLINICAL DATA:  Right arm numbness.  Symptoms began 2-3 hours ago. EXAM: CT ANGIOGRAPHY HEAD AND NECK TECHNIQUE: Multidetector CT imaging of the head and neck was performed using the standard protocol during bolus administration of intravenous contrast. Multiplanar CT image reconstructions and MIPs were obtained to evaluate the vascular anatomy. Carotid stenosis measurements (when applicable) are obtained utilizing NASCET criteria, using the distal  internal carotid diameter as the denominator. CONTRAST:  125mL OMNIPAQUE IOHEXOL 350 MG/ML SOLN COMPARISON:  None. FINDINGS: CT HEAD FINDINGS Brain: Mild age related volume loss. No sign of acute infarction, mass lesion, hemorrhage, hydrocephalus or extra-axial collection. Vascular: There is atherosclerotic calcification of the major vessels at the base of the brain. Skull: Negative Sinuses: Clear Orbits: Normal Review of the MIP images confirms the above findings CTA NECK FINDINGS Aortic arch: Aortic atherosclerosis. Branching pattern is normal. No origin stenosis. Right carotid system: Innominate artery is widely patent. Common carotid artery is widely patent, with a few areas of nonstenotic plaque.  At the carotid bifurcation and ICA bulb, there is advanced irregular soft and calcified plaque. Pronounced irregularity could serve as a source of embolic disease. Minimal diameter throughout the region measures 3.5 mm. Compared to a more distal cervical ICA diameter of 5 mm, this indicates a 30% stenosis. Left carotid system: Common carotid artery shows some atherosclerotic plaque but no stenosis proximal to the bifurcation region. At the carotid bifurcation and ICA bulb, there is soft and calcified plaque. As seen on the right, there is considerable irregularity which could serve as a source of embolic disease. Minimal diameter of 3.5 mm indicates a stenosis of 30%. Cervical ICA widely patent beyond that. Vertebral arteries: Soft and calcified plaque at the proximal right subclavian artery with 50% stenosis. Atherosclerotic plaque at the right vertebral artery origin with stenosis of 30%. Beyond that, the vessel is widely patent through the cervical region to the foramen magnum. Left subclavian artery shows some atherosclerotic change but no measurable stenosis. 50% stenosis of the left vertebral artery origin. Beyond that, the vessel is widely patent through the cervical region. Skeleton: Ordinary cervical  spondylosis and facet osteoarthritis. Other neck: No mass or lymphadenopathy. Upper chest: Negative Review of the MIP images confirms the above findings CTA HEAD FINDINGS Anterior circulation: Both internal carotid arteries are widely patent through the skull base and siphon regions. Ordinary siphon atherosclerosis without stenosis greater than 30%. The anterior and middle cerebral vessels are patent without large or medium vessel occlusion or correctable proximal stenosis. Posterior circulation: Both vertebral arteries are patent through the foramen magnum to the basilar. Narrowing of the mid to distal basilar artery with atherosclerotic irregularity. Flow is present in both superior cerebellar and posterior cerebral arteries. Right PCA receives most of it supply from the anterior circulation. Venous sinuses: Patent and normal. Anatomic variants: None significant. Review of the MIP images confirms the above findings IMPRESSION: 1. Advanced atherosclerotic disease at both carotid bifurcations and ICA bulbs. 30% stenosis of the internal carotid arteries on both sides. Pronounced irregularity could serve as a source of embolic disease. 2. 50% stenosis of the proximal right subclavian artery. 30% stenosis of the right vertebral artery origin. 50% stenosis of the left vertebral artery origin. Narrowing of the mid to distal basilar artery with atherosclerotic irregularity. Flow is present in both superior cerebellar and posterior cerebral arteries. 3. No intracranial large or medium vessel occlusion or correctable proximal stenosis. 4. No acute brain CT finding. Aortic Atherosclerosis (ICD10-I70.0). Electronically Signed   By: Nelson Chimes M.D.   On: 09/15/2020 12:13   DG Chest 2 View  Result Date: 09/15/2020 CLINICAL DATA:  Stroke, right-sided weakness EXAM: CHEST - 2 VIEW COMPARISON:  None. FINDINGS: Lungs are well expanded, symmetric, and clear. No pneumothorax or pleural effusion. Cardiac size within normal  limits. Coronary artery stenting has been performed. Pulmonary vascularity is normal. Osseous structures are age-appropriate. No acute bone abnormality. IMPRESSION: No active cardiopulmonary disease. Electronically Signed   By: Fidela Salisbury MD   On: 09/15/2020 20:30   CT Angio Neck W and/or Wo Contrast  Result Date: 09/15/2020 CLINICAL DATA:  Right arm numbness.  Symptoms began 2-3 hours ago. EXAM: CT ANGIOGRAPHY HEAD AND NECK TECHNIQUE: Multidetector CT imaging of the head and neck was performed using the standard protocol during bolus administration of intravenous contrast. Multiplanar CT image reconstructions and MIPs were obtained to evaluate the vascular anatomy. Carotid stenosis measurements (when applicable) are obtained utilizing NASCET criteria, using the distal internal carotid diameter as the denominator.  CONTRAST:  168mL OMNIPAQUE IOHEXOL 350 MG/ML SOLN COMPARISON:  None. FINDINGS: CT HEAD FINDINGS Brain: Mild age related volume loss. No sign of acute infarction, mass lesion, hemorrhage, hydrocephalus or extra-axial collection. Vascular: There is atherosclerotic calcification of the major vessels at the base of the brain. Skull: Negative Sinuses: Clear Orbits: Normal Review of the MIP images confirms the above findings CTA NECK FINDINGS Aortic arch: Aortic atherosclerosis. Branching pattern is normal. No origin stenosis. Right carotid system: Innominate artery is widely patent. Common carotid artery is widely patent, with a few areas of nonstenotic plaque. At the carotid bifurcation and ICA bulb, there is advanced irregular soft and calcified plaque. Pronounced irregularity could serve as a source of embolic disease. Minimal diameter throughout the region measures 3.5 mm. Compared to a more distal cervical ICA diameter of 5 mm, this indicates a 30% stenosis. Left carotid system: Common carotid artery shows some atherosclerotic plaque but no stenosis proximal to the bifurcation region. At the  carotid bifurcation and ICA bulb, there is soft and calcified plaque. As seen on the right, there is considerable irregularity which could serve as a source of embolic disease. Minimal diameter of 3.5 mm indicates a stenosis of 30%. Cervical ICA widely patent beyond that. Vertebral arteries: Soft and calcified plaque at the proximal right subclavian artery with 50% stenosis. Atherosclerotic plaque at the right vertebral artery origin with stenosis of 30%. Beyond that, the vessel is widely patent through the cervical region to the foramen magnum. Left subclavian artery shows some atherosclerotic change but no measurable stenosis. 50% stenosis of the left vertebral artery origin. Beyond that, the vessel is widely patent through the cervical region. Skeleton: Ordinary cervical spondylosis and facet osteoarthritis. Other neck: No mass or lymphadenopathy. Upper chest: Negative Review of the MIP images confirms the above findings CTA HEAD FINDINGS Anterior circulation: Both internal carotid arteries are widely patent through the skull base and siphon regions. Ordinary siphon atherosclerosis without stenosis greater than 30%. The anterior and middle cerebral vessels are patent without large or medium vessel occlusion or correctable proximal stenosis. Posterior circulation: Both vertebral arteries are patent through the foramen magnum to the basilar. Narrowing of the mid to distal basilar artery with atherosclerotic irregularity. Flow is present in both superior cerebellar and posterior cerebral arteries. Right PCA receives most of it supply from the anterior circulation. Venous sinuses: Patent and normal. Anatomic variants: None significant. Review of the MIP images confirms the above findings IMPRESSION: 1. Advanced atherosclerotic disease at both carotid bifurcations and ICA bulbs. 30% stenosis of the internal carotid arteries on both sides. Pronounced irregularity could serve as a source of embolic disease. 2. 50%  stenosis of the proximal right subclavian artery. 30% stenosis of the right vertebral artery origin. 50% stenosis of the left vertebral artery origin. Narrowing of the mid to distal basilar artery with atherosclerotic irregularity. Flow is present in both superior cerebellar and posterior cerebral arteries. 3. No intracranial large or medium vessel occlusion or correctable proximal stenosis. 4. No acute brain CT finding. Aortic Atherosclerosis (ICD10-I70.0). Electronically Signed   By: Nelson Chimes M.D.   On: 09/15/2020 12:13   MR BRAIN WO CONTRAST  Result Date: 09/16/2020 CLINICAL DATA:  Left hand weakness. Numbness in the fourth and fifth digits of the hand. EXAM: MRI HEAD WITHOUT CONTRAST TECHNIQUE: Multiplanar, multiecho pulse sequences of the brain and surrounding structures were obtained without intravenous contrast. COMPARISON:  CTA head and neck 09/15/2020 FINDINGS: Brain: Punctate areas of acute nonhemorrhagic infarct involves the left  primary motor cortex and left subcortical parietal white matter. Hemorrhage or mass lesion is present. Minimal white matter changes are otherwise within normal limits for age. The ventricles are of normal size. No significant extraaxial fluid collection is present. The internal auditory canals are within normal limits. The brainstem and cerebellum are within normal limits. Vascular: Flow is present in the major intracranial arteries. Skull and upper cervical spine: The craniocervical junction is normal. Upper cervical spine is within normal limits. Marrow signal is unremarkable. Sinuses/Orbits: The paranasal sinuses and mastoid air cells are clear. The globes and orbits are within normal limits. IMPRESSION: 1. Punctate areas of acute nonhemorrhagic infarct involving the left primary motor cortex and left subcortical parietal white matter. The above was relayed via text pager to Dr. Leonel Ramsay On 09/16/2020 at 06:01 . Electronically Signed   By: San Morelle M.D.    On: 09/16/2020 06:01   MR CERVICAL SPINE WO CONTRAST  Result Date: 09/16/2020 CLINICAL DATA:  Right hand weakness and numbness.  Field EXAM: MRI CERVICAL SPINE WITHOUT CONTRAST TECHNIQUE: Multiplanar, multisequence MR imaging of the cervical spine was performed. No intravenous contrast was administered. COMPARISON:  CTA of the neck 10/13/1 FINDINGS: Alignment: No significant listhesis is present. Mild straightening of the cervical lordosis is stable. Vertebrae: Marrow signal and vertebral body heights are normal. Cord: Normal signal is present in the cervical and upper thoracic spinal cord to the lowest imaged level T3-4. Posterior Fossa, vertebral arteries, paraspinal tissues: Craniocervical junction is normal. Disc levels: C2-3: Negative. C3-4: Facet and uncovertebral spurring results in mild left foraminal narrowing. C4-5: Uncovertebral and facet hypertrophy is present bilaterally. Moderate foraminal narrowing is worse on the left. The central canal is patent. C5-6: Uncovertebral and facet disease leads to severe left and moderate right foraminal narrowing. C6-7: Uncovertebral and facet spurring leads to severe foraminal narrowing bilaterally. The central canal is patent. C7-T1: Asymmetric left-sided facet hypertrophy contributes to moderate left foraminal narrowing. IMPRESSION: 1. Multilevel spondylosis of the cervical spine as described. 2. Moderate foraminal narrowing bilaterally at C4-5 is worse on the left. 3. Severe left and moderate right foraminal stenosis at C5-6. 4. Severe foraminal narrowing bilaterally at C6-7. 5. Moderate left foraminal narrowing at C7-T1. Electronically Signed   By: San Morelle M.D.   On: 09/16/2020 06:23   ECHOCARDIOGRAM COMPLETE  Result Date: 09/16/2020    ECHOCARDIOGRAM REPORT   Patient Name:   [facility] Date of Exam: 09/16/2020 Medical Rec #:  [id number]       Height:       68.5 in Accession #:    [id number]      Weight:       172.6 lb Date of Birth:   1950-12-16       BSA:          1.930 m Patient Age:    1 years        BP:           130/84 mmHg Patient Gender: M               HR:           77 bpm. Exam Location:  Inpatient Procedure: 2D Echo, Cardiac Doppler and Color Doppler Indications:    Stroke 434.91 / I163.9  History:        Patient has prior history of Echocardiogram examinations, most                 recent 07/14/2019. CAD, Signs/Symptoms:Murmur; Risk  Factors:Hypertension and Dyslipidemia. GERD.  Sonographer:    Vickie Epley RDCS Referring Phys: [phone] JARED M GARDNER  Sonographer Comments: Image acquisition challenging due to respiratory motion. IMPRESSIONS  1. Left ventricular ejection fraction, by estimation, is 65 to 70%. The left ventricle has normal function. The left ventricle has no regional wall motion abnormalities. There is moderate concentric left ventricular hypertrophy. Left ventricular diastolic parameters are consistent with Grade I diastolic dysfunction (impaired relaxation).  2. Right ventricular systolic function is normal. The right ventricular size is normal.  3. Left atrial size was moderately dilated.  4. The mitral valve is normal in structure. Trivial mitral valve regurgitation. No evidence of mitral stenosis.  5. The aortic valve is normal in structure. There is mild calcification of the aortic valve. There is mild thickening of the aortic valve. Aortic valve regurgitation is mild. No aortic stenosis is present.  6. The inferior vena cava is normal in size with greater than 50% respiratory variability, suggesting right atrial pressure of 3 mmHg. Conclusion(s)/Recommendation(s): No intracardiac source of embolism detected on this transthoracic study. A transesophageal echocardiogram is recommended to exclude cardiac source of embolism if clinically indicated. FINDINGS  Left Ventricle: Left ventricular ejection fraction, by estimation, is 65 to 70%. The left ventricle has normal function. The left ventricle has no  regional wall motion abnormalities. The left ventricular internal cavity size was normal in size. There is  moderate concentric left ventricular hypertrophy. Left ventricular diastolic parameters are consistent with Grade I diastolic dysfunction (impaired relaxation). Normal left ventricular filling pressure. Right Ventricle: The right ventricular size is normal. No increase in right ventricular wall thickness. Right ventricular systolic function is normal. Left Atrium: Left atrial size was moderately dilated. Right Atrium: Right atrial size was normal in size. Pericardium: There is no evidence of pericardial effusion. Mitral Valve: The mitral valve is normal in structure. Trivial mitral valve regurgitation. No evidence of mitral valve stenosis. Tricuspid Valve: The tricuspid valve is normal in structure. Tricuspid valve regurgitation is mild . No evidence of tricuspid stenosis. Aortic Valve: The aortic valve is normal in structure. There is mild calcification of the aortic valve. There is mild thickening of the aortic valve. Aortic valve regurgitation is mild. No aortic stenosis is present. Pulmonic Valve: The pulmonic valve was normal in structure. Pulmonic valve regurgitation is not visualized. No evidence of pulmonic stenosis. Aorta: The aortic root is normal in size and structure. Venous: The inferior vena cava is normal in size with greater than 50% respiratory variability, suggesting right atrial pressure of 3 mmHg. IAS/Shunts: No atrial level shunt detected by color flow Doppler.  LEFT VENTRICLE PLAX 2D LVIDd:         5.80 cm      Diastology LVIDs:         4.30 cm      LV e' medial:    3.81 cm/s LV PW:         1.20 cm      LV E/e' medial:  10.1 LV IVS:        1.20 cm      LV e' lateral:   5.11 cm/s LVOT diam:     2.30 cm      LV E/e' lateral: 7.6 LV SV:         143 LV SV Index:   74 LVOT Area:     4.15 cm  LV Volumes (MOD) LV vol d, MOD A2C: 135.0 ml LV vol d, MOD A4C: 144.0 ml LV vol  s, MOD A2C: 51.7 ml LV  vol s, MOD A4C: 67.9 ml LV SV MOD A2C:     83.3 ml LV SV MOD A4C:     144.0 ml LV SV MOD BP:      80.4 ml RIGHT VENTRICLE RV S prime:     10.60 cm/s TAPSE (M-mode): 2.1 cm LEFT ATRIUM             Index       RIGHT ATRIUM           Index LA diam:        4.50 cm 2.33 cm/m  RA Area:     15.10 cm LA Vol (A2C):   53.5 ml 27.72 ml/m RA Volume:   40.90 ml  21.19 ml/m LA Vol (A4C):   30.9 ml 16.01 ml/m LA Biplane Vol: 42.4 ml 21.97 ml/m  AORTIC VALVE LVOT Vmax:   175.00 cm/s LVOT Vmean:  131.000 cm/s LVOT VTI:    0.344 m  AORTA Ao Root diam: 3.20 cm MITRAL VALVE MV Area (PHT): 2.71 cm    SHUNTS MV Decel Time: 280 msec    Systemic VTI:  0.34 m MV E velocity: 38.60 cm/s  Systemic Diam: 2.30 cm MV A velocity: 83.10 cm/s MV E/A ratio:  0.46 Ena Dawley MD Electronically signed by Ena Dawley MD Signature Date/Time: 09/16/2020/1:32:59 PM    Final     Scheduled Meds: . aspirin EC  81 mg Oral Daily  . carvedilol  6.25 mg Oral BID  . clopidogrel  75 mg Oral Daily  . enoxaparin (LOVENOX) injection  40 mg Subcutaneous Q24H   Continuous Infusions:   LOS: 1 day   Time spent: 35 minutes   Darliss Cheney, MD [facility]  09/16/2020, 1:47 PM   To contact the attending provider between 7A-7P or the covering provider during after hours 7P-7A, please log into the web site [email or web address].

## 2020-09-16 NOTE — Consult Note (Addendum)
ELECTROPHYSIOLOGY CONSULT NOTE  Patient ID: Brandon Gonzales MRN: [id number], DOB/AGE: 1950-12-25   Admit date: 09/15/2020 Date of Consult: 09/16/2020  Primary Physician: [name], NP Primary Cardiologist: Dr. Martinique EP: Dr. Lovena Le Reason for Consultation: Cryptogenic stroke ; recommendations regarding Implantable Loop Recorder, requested by Dr. Leonie Man  History of Present Illness Brandon Gonzales was admitted on 09/15/2020 with R hand numbness and weakness, found with stroke   PMHx noted fr CAD (mid LAD stenting in 2007 with PTCA to D1, followed by lateral STEMI and PCI/DES to the midLCX &proximalLAD in 2013. S/p cutting balloon PTCA of the LAD in June 2019 for in stent restenosis) HCM with abnormal cMRI as noted below and h/o near syncope, NSVT, and some sinus node dysfunction has been recommended an ICD (not yet completed) HTN, HLD, GERD, C-spine disease, arthritis  Imaging demonstrated Punctate areas of acute nonhemorrhagic infarct involving the left primary motor cortex and left subcortical parietal white matter .  he has undergone workup for stroke including carotid angio, TTE is done, pending read.  The patient has been monitored on telemetry which has demonstrated sinus rhythm with no arrhythmias. neuroogy has deferred TEE  Echocardiogram this admission  Is pending from today  07/14/2019: TTE IMPRESSIONS  1. The left ventricle has normal systolic function with an ejection  fraction of 60-65%. The cavity size was normal. Severe basal septal  hypertrophy and mild concentric hypertrophy. Left ventricular diastolic  Doppler parameters are consistent with  pseudonormalization. Elevated left ventricular end-diastolic pressure.  2. The average left ventricular global longitudinal strain is -16.0 %.  3. The right ventricle has normal systolic function. The cavity was  normal. There is no increase in right ventricular wall thickness.  4. Left atrial size was mildly  dilated.  5. The aortic valve is tricuspid. Moderate sclerosis of the aortic valve.  Aortic valve regurgitation is mild by color flow Doppler.  6. The aorta is normal in size and structure.    02/25/2020: c.MRI IMPRESSION: 1.  Normal left ventricular size, LVEF 52%.  2. Akinesis of the basal-mid inferolateral wall with scar and wall thinning. Hypokinesis of the basal-apical anterior wall. Subendocardial delayed enhancement that is <50% thickness of the myocardium, suggestive of ischemic heart disease with viability in these regions.  3. Hypertrophic cardiomyopathy, sigmoid subtype. 18 mm basal anteroseptum.  4. Left ventricular outflow tract obstruction with systolic anterior motion of the mitral valve and qualitatively mild-moderate mitral valve regurgitation.  5. Mild myocardial enhancement in the RV insertion points at the base. No significant delayed myocardial enhancement associated with area of myocardial thickening.  6. No evidence of cardiac amyloidosis. Normal extracellular volume, 22%.  7.  Normal right ventricular size and function, RVEF 60%  8.  Trace pericardial effusion.   Prior to admission, the patient denies chest pain, shortness of breath, dizziness, or syncope.  He notices at night an irregularity to his heart beat with skipped/missing beats. They are recovering from their stroke with plans to home today at discharge    Past Medical History:  Diagnosis Date  . Arthritis   . Cancer (HCC)    SKIN CA  SQUAMOUS CELL  . Coronary artery disease    a. 2007 Stent to LAD, PTCA D1;  b. DES to LAD & LCx 10/13 in the setting of STEMI.  [name] Kitchen Dyslipidemia    a. Statin and zetia-intolerant. On niacin.  [name] Kitchen GERD (gastroesophageal reflux disease)   . Heart murmur   . Hypertension   .  Hypertrophic cardiomyopathy ([place])    a. 09/2012 Echo: EF 55%, mid to apical anterolateral, posterior, apical anterior, apical HK, Gr1 DD, turbulence across LVOT suggesting degree  of obstruction, mild MR, mildly dil LA.     Surgical History:  Past Surgical History:  Procedure Laterality Date  . CARDIAC CATHETERIZATION  2007   stent to LAD and PTCA of diagonal branch  . CARDIAC CATHETERIZATION  2010  . CORONARY ANGIOPLASTY WITH STENT PLACEMENT  2013   95-99% prox LAD, patent LAD stent distal to this, occluded mid-dist LCx, mild <10% RCA irreg; s/p DES-prox LAD & DES to mid-dist LCx  . CORONARY BALLOON ANGIOPLASTY N/A 05/16/2017   Procedure: Coronary Balloon Angioplasty;  Surgeon: Leonie Man, MD;  Location: [facility];  Service: Cardiovascular;  Laterality: N/A;  . [place]   right  . [place]   left  . LEFT HEART CATH Bilateral 09/22/2012   Procedure: LEFT HEART CATH;  Surgeon: Peter M Martinique, MD;  Location: [facility] CATH LAB;  Service: Cardiovascular;  Laterality: Bilateral;  . LEFT HEART CATH AND CORONARY ANGIOGRAPHY N/A 05/16/2017   Procedure: Left Heart Cath and Coronary Angiography;  Surgeon: Leonie Man, MD;  Location: [facility];  Service: Cardiovascular;  Laterality: N/A;  . PERCUTANEOUS CORONARY STENT INTERVENTION (PCI-S) Right 09/22/2012   Procedure: PERCUTANEOUS CORONARY STENT INTERVENTION (PCI-S);  Surgeon: Peter M Martinique, MD;  Location: [facility] CATH LAB;  Service: Cardiovascular;  Laterality: Right;     Medications Prior to Admission  Medication Sig Dispense Refill Last Dose  . amLODipine (NORVASC) 2.5 MG tablet TAKE 1 TABLET BY MOUTH EVERY DAY (Patient taking differently: Take 2.5 mg by mouth daily. ) 90 tablet 2 09/15/2020 at Unknown time  . aspirin EC 81 MG tablet Take 81 mg by mouth daily.   09/15/2020 at Unknown time  . calcium carbonate (TUMS EX) 750 MG chewable tablet Chew 2 tablets by mouth daily as needed for heartburn.   Past Week at Unknown time  . carvedilol (COREG) 12.5 MG tablet TAKE 0.5 TABLETS (6.25 MG TOTAL) BY MOUTH 2 (TWO) TIMES DAILY. 90 tablet 3 09/15/2020 at 0700  . esomeprazole (NEXIUM) 20 MG  capsule Take 20 mg by mouth every morning.   09/15/2020 at Unknown time  . Inositol Niacinate (NIACIN FLUSH FREE) 500 MG CAPS Take 1,000 mg by mouth daily.   09/15/2020 at Unknown time  . lisinopril (ZESTRIL) 40 MG tablet Take 1 tablet (40 mg total) by mouth daily. 90 tablet 3 09/15/2020 at Unknown time  . nitroGLYCERIN (NITROSTAT) 0.4 MG SL tablet Place 1 tablet (0.4 mg total) under the tongue every 5 (five) minutes as needed for chest pain. 25 tablet 3 unknown at unknown  . valACYclovir (VALTREX) 500 MG tablet Take 1 tablet (500 mg total) by mouth daily as needed (fever blisters). 28 tablet 1 Past Week at Unknown time  . AMBULATORY NON FORMULARY MEDICATION Take 180 mg by mouth daily. Medication Name: bempedoic acid vs a placebo CLEAR Research Study drug provided   unknown at unknown    Inpatient Medications:  . aspirin  81 mg Oral Once  . carvedilol  6.25 mg Oral BID  . clopidogrel  75 mg Oral Daily  . enoxaparin (LOVENOX) injection  40 mg Subcutaneous Q24H    Allergies:  Allergies  Allergen Reactions  . Antihistamines, Chlorpheniramine-Type Other (See Comments)    Altered mental status  . Pheniramine Other (See Comments)    Altered mental  status  . Statins Other (See Comments)    Leg cramps    . Zetia [Ezetimibe] Other (See Comments)    Leg cramps    Social History   Socioeconomic History  . Marital status: Married    Spouse name: Not on file  . Number of children: Not on file  . Years of education: Not on file  . Highest education level: Not on file  Occupational History  . Not on file  Tobacco Use  . Smoking status: Never Smoker  . Smokeless tobacco: Never Used  Vaping Use  . Vaping Use: Never used  Substance and Sexual Activity  . Alcohol use: No  . Drug use: No  . Sexual activity: Not Currently  Other Topics Concern  . Not on file  Social History Narrative  . Not on file   Social Determinants of Health   Financial Resource Strain:   . Difficulty of Paying  Living Expenses: Not on file  Food Insecurity:   . Worried About Charity fundraiser in the Last Year: Not on file  . Ran Out of Food in the Last Year: Not on file  Transportation Needs:   . Lack of Transportation (Medical): Not on file  . Lack of Transportation (Non-Medical): Not on file  Physical Activity:   . Days of Exercise per Week: Not on file  . Minutes of Exercise per Session: Not on file  Stress:   . Feeling of Stress : Not on file  Social Connections:   . Frequency of Communication with Friends and Family: Not on file  . Frequency of Social Gatherings with Friends and Family: Not on file  . Attends Religious Services: Not on file  . Active Member of Clubs or Organizations: Not on file  . Attends Archivist Meetings: Not on file  . Marital Status: Not on file  Intimate Partner Violence:   . Fear of Current or Ex-Partner: Not on file  . Emotionally Abused: Not on file  . Physically Abused: Not on file  . Sexually Abused: Not on file     Family History  Problem Relation Age of Onset  . Heart attack Mother 24  . Heart disease Mother       Review of Systems: All other systems reviewed and are otherwise negative except as noted above.  Physical Exam: Vitals:   09/15/20 2123 09/15/20 2318 09/16/20 0359 09/16/20 0738  BP: (!) 158/80 (!) 162/91 (!) 133/91 130/84  Pulse: 68 60 66 64  Resp: 18 18 20 18   Temp: 97.8 F (36.6 C) 97.6 F (36.4 C) 97.8 F (36.6 C) 98 F (36.7 C)  TempSrc: Oral Oral Oral Oral  SpO2: 99% 97% 97% 96%    GEN- The patient is well appearing, alert and oriented x 3 today.   Head- normocephalic, atraumatic Eyes-  Sclera clear, conjunctiva pink Ears- hearing intact Oropharynx- clear Neck- supple Lungs- CTA b/l, normal work of breathing Heart- RRR, 2/6 SM, no rubs or gallops  GI- soft, NT, ND Extremities- no clubbing, cyanosis, or edema MS- no significant deformity or atrophy Skin- no rash or lesion Psych- euthymic mood, full  affect   Labs:   Lab Results  Component Value Date   WBC 6.7 09/15/2020   HGB 16.7 09/15/2020   HCT 49.1 09/15/2020   MCV 88.8 09/15/2020   PLT 172 09/15/2020    Recent Labs  Lab 09/15/20 1131  NA 141  K 4.3  CL 105  CO2 27  BUN 14  CREATININE 1.20  CALCIUM 9.3  PROT 6.9  BILITOT 1.9*  ALKPHOS 52  ALT 25  AST 25  GLUCOSE 98   Lab Results  Component Value Date   TROPONINI <0.03 10/24/2018   Lab Results  Component Value Date   CHOL 208 (H) 09/16/2020   CHOL 195 09/10/2019   CHOL 183 02/22/2018   Lab Results  Component Value Date   HDL 27 (L) 09/16/2020   HDL 31 (L) 09/10/2019   HDL 31 (L) 02/22/2018   Lab Results  Component Value Date   LDLCALC 133 (H) 09/16/2020   LDLCALC 138 (H) 09/10/2019   LDLCALC 128 (H) 02/22/2018   Lab Results  Component Value Date   TRIG 238 (H) 09/16/2020   TRIG 145 09/10/2019   TRIG 122 02/22/2018   Lab Results  Component Value Date   CHOLHDL 7.7 09/16/2020   CHOLHDL 6.3 (H) 09/10/2019   CHOLHDL 5.9 (H) 02/22/2018   Lab Results  Component Value Date   LDLDIRECT 159.5 10/01/2012    Lab Results  Component Value Date   DDIMER 0.28 05/16/2017     Radiology/Studies:   CT Angio Head W or Wo Contrast Result Date: 09/15/2020 CLINICAL DATA:  Right arm numbness.  Symptoms began 2-3 hours ago. EXAM: CT ANGIOGRAPHY HEAD AND NECK TECHNIQUE: Multidetector CT imaging of the head and neck was performed using the standard protocol during bolus administration of intravenous contrast. Multiplanar CT image reconstructions and MIPs were obtained to evaluate the vascular anatomy. Carotid stenosis measurements (when applicable) are obtained utilizing NASCET criteria, using the distal internal carotid diameter as the denominator. CONTRAST:  173mL OMNIPAQUE IOHEXOL 350 MG/ML SOLN COMPARISON:  None. FINDINGS: CT HEAD FINDINGS Brain: Mild age related volume loss. No sign of acute infarction, mass lesion, hemorrhage, hydrocephalus or  extra-axial collection. Vascular: There is atherosclerotic calcification of the major vessels at the base of the brain. Skull: Negative Sinuses: Clear Orbits: Normal Review of the MIP images confirms the above findings CTA NECK FINDINGS Aortic arch: Aortic atherosclerosis. Branching pattern is normal. No origin stenosis. Right carotid system: Innominate artery is widely patent. Common carotid artery is widely patent, with a few areas of nonstenotic plaque. At the carotid bifurcation and ICA bulb, there is advanced irregular soft and calcified plaque. Pronounced irregularity could serve as a source of embolic disease. Minimal diameter throughout the region measures 3.5 mm. Compared to a more distal cervical ICA diameter of 5 mm, this indicates a 30% stenosis. Left carotid system: Common carotid artery shows some atherosclerotic plaque but no stenosis proximal to the bifurcation region. At the carotid bifurcation and ICA bulb, there is soft and calcified plaque. As seen on the right, there is considerable irregularity which could serve as a source of embolic disease. Minimal diameter of 3.5 mm indicates a stenosis of 30%. Cervical ICA widely patent beyond that. Vertebral arteries: Soft and calcified plaque at the proximal right subclavian artery with 50% stenosis. Atherosclerotic plaque at the right vertebral artery origin with stenosis of 30%. Beyond that, the vessel is widely patent through the cervical region to the foramen magnum. Left subclavian artery shows some atherosclerotic change but no measurable stenosis. 50% stenosis of the left vertebral artery origin. Beyond that, the vessel is widely patent through the cervical region. Skeleton: Ordinary cervical spondylosis and facet osteoarthritis. Other neck: No mass or lymphadenopathy. Upper chest: Negative Review of the MIP images confirms the above findings CTA HEAD FINDINGS Anterior circulation: Both internal carotid arteries are widely  patent through the skull  base and siphon regions. Ordinary siphon atherosclerosis without stenosis greater than 30%. The anterior and middle cerebral vessels are patent without large or medium vessel occlusion or correctable proximal stenosis. Posterior circulation: Both vertebral arteries are patent through the foramen magnum to the basilar. Narrowing of the mid to distal basilar artery with atherosclerotic irregularity. Flow is present in both superior cerebellar and posterior cerebral arteries. Right PCA receives most of it supply from the anterior circulation. Venous sinuses: Patent and normal. Anatomic variants: None significant. Review of the MIP images confirms the above findings IMPRESSION: 1. Advanced atherosclerotic disease at both carotid bifurcations and ICA bulbs. 30% stenosis of the internal carotid arteries on both sides. Pronounced irregularity could serve as a source of embolic disease. 2. 50% stenosis of the proximal right subclavian artery. 30% stenosis of the right vertebral artery origin. 50% stenosis of the left vertebral artery origin. Narrowing of the mid to distal basilar artery with atherosclerotic irregularity. Flow is present in both superior cerebellar and posterior cerebral arteries. 3. No intracranial large or medium vessel occlusion or correctable proximal stenosis. 4. No acute brain CT finding. Aortic Atherosclerosis (ICD10-I70.0). Electronically Signed   By: Nelson Chimes M.D.   On: 09/15/2020 12:13    MR BRAIN WO CONTRAST Result Date: 09/16/2020 CLINICAL DATA:  Left hand weakness. Numbness in the fourth and fifth digits of the hand. EXAM: MRI HEAD WITHOUT CONTRAST TECHNIQUE: Multiplanar, multiecho pulse sequences of the brain and surrounding structures were obtained without intravenous contrast. COMPARISON:  CTA head and neck 09/15/2020 FINDINGS: Brain: Punctate areas of acute nonhemorrhagic infarct involves the left primary motor cortex and left subcortical parietal white matter. Hemorrhage or mass  lesion is present. Minimal white matter changes are otherwise within normal limits for age. The ventricles are of normal size. No significant extraaxial fluid collection is present. The internal auditory canals are within normal limits. The brainstem and cerebellum are within normal limits. Vascular: Flow is present in the major intracranial arteries. Skull and upper cervical spine: The craniocervical junction is normal. Upper cervical spine is within normal limits. Marrow signal is unremarkable. Sinuses/Orbits: The paranasal sinuses and mastoid air cells are clear. The globes and orbits are within normal limits. IMPRESSION: 1. Punctate areas of acute nonhemorrhagic infarct involving the left primary motor cortex and left subcortical parietal white matter. The above was relayed via text pager to Dr. Leonel Ramsay On 09/16/2020 at 06:01 . Electronically Signed   By: San Morelle M.D.   On: 09/16/2020 06:01     12-lead ECG SR, no AFib All prior EKG's in EPIC reviewed with no documented atrial fibrillation  Feb 2021 (9 day) monitor  Normal sinus rhythm  Rare PACs. short runs of PAT. longest SVT appears to be ectopic atrial tachycardia.  Rare PVCs with bigeminy  13 runs of VT versus SVT with aberrancy. longest 14 beats.  Patient triggered events do not correlate with arrhythmia   Telemetry SR, infrequent PVCs  Assessment and Plan:  1. Cryptogenic stroke The patient presents with cryptogenic stroke, EP is asked to consider loop implant for Afib surveillence.  The patient saw Dr. Lovena Le April 2020 for consideration of ICD with hx of HCM w/obstruuction, abnormal c.MRI, and a monitor noting daytime HRs 40's and NSVT.  In his plan at that time was to implant device with an A lead to allow further titration of beta blockers and felt he may need amiodarone in the future as well.  He was scheduled for the  implant though no-showed and ultimately the patient decided to hold off. He saw Dr. Martinique  most recently 09/13/2020, was feeling well, no recurrent near syncopal spells  I discussed with the patient I felt revisiting with Dr. Lovena Le outpatient ICD (with pacing support and monitoring capabilities for Afib surveillance) would be the next best step.  He and his wife are quite reluctant about wires in his heart and feel like starting with the loop is the more comfortable and less invasive device. He mentions that in his dicussion with Dr. Lovena Le did not get the impression the ICD was very strongly recommended and maybe not even needed.  And in his personal research, did not feel like he needed it.  At this juncture, he is most comfortable holding off and revisiting with Dr. Martinique to see what his recommendation would be.  I will try to facilitate an early follow up with Dr. Martinique.  Dr. Rayann Heman will see the patient later today    ADDEND: Dr. Rayann Heman has seen the patient.  Dr. Rayann Heman oferred monitoring, though he prefers not to wear another monitor.  He is agreeable to revisit with Dr. Lovena Le regarding ICD implant. I have made him an appointment to see Dr. Lovena Le on Tuesday, 09/21/20.   Renee Dyane Dustman, PA-C 09/16/2020   I have seen, examined the patient, and reviewed the above assessment and plan.  Changes to above are made where necessary.  On exam, RRR.  The patient has previously been referred for dual chamber ICD.  He declined but is not interested.  We will have him follow-up with Dr Lovena Le at the next available time for further discussions.  The patient declined 30 day monitor at this time.  Ok to discharge with close follow-up with Dr Lovena Le.  Co Sign: Thompson Grayer, MD 09/16/2020 3:03 PM

## 2020-09-16 NOTE — Discharge Summary (Signed)
Physician Discharge Summary  Brandon Gonzales INO:[id number] DOB: 01/30/1951 DOA: 09/15/2020  PCP: [name], NP  Admit date: 09/15/2020 Discharge date: 09/16/2020  Admitted From: Home Disposition: Home  Recommendations for Outpatient Follow-up:  1. Follow up with PCP in 1-2 weeks 2. Follow-up with neurology in 4 to 6 weeks 3. Please hold your all antihypertensives which include amlodipine, lisinopril and carvedilol for next 2 to 3 days and monitor your blood pressure.  Please take any of those if your systolic blood pressure is over 180. 4. Please follow-up with your cardiologist/Dr. Lovena Le on 09/21/2020 5. Please obtain BMP/CBC in one week 6. Please follow up with your PCP on the following pending results: Unresulted Labs (From admission, onward)          Start     Ordered   09/16/20 0500  Hemoglobin A1c  Tomorrow morning,   R        09/15/20 1938           Home Health: None Equipment/Devices: None  Discharge Condition: Stable CODE STATUS: Full code Diet recommendation: Cardiac  Subjective: Seen and examined this morning and again this afternoon when his wife was present.  Doing fine.  Very minimal weakness in right 4th and 5th digit.  Wants to go home.  SJG:GEZMOQ F Pinnixis a 69 y.o.malewith medical history significant ofCAD s/p PCI and stents, HOCM EF 55% AICD recd in April 21 but deferred by pt for now, HLD with severe statin intolerance, HTN.  Patient presents to the ED at [facility] with c/o R arm numbness and R 4th and 5th finger weakness. Symptoms onset at 845am while in the shower. No associated facial droop. Symptoms are persistent and unchanged since onset, nothing makes better or worse.  No CP, no palpitations, no lightheadedness, dizziness, syncope, weakness.  ED Course:CTA head and neck showed advanced atherosclerotic dz at bilateral carotid bifurcations, 30% stenosis ICA both sides but pronounced plaque irregularity that could serve as source of  embolic dz.  No large vessel intracranial occlusion.  No acute brain CT finding.  Pt transferred to [facility] for stroke work up.  Assessment & Plan:   Principal Problem:   Acute ischemic stroke [facility]) Active Problems:   Dyslipidemia   Hypertrophic cardiomyopathy ([place])   Essential hypertension   CAD S/P percutaneous coronary angioplasty  Brief/Interim Summary: Patient was admitted for acute ischemic stroke. Acute ischemic stroke: Multiple left MCA infarcts embolic secondary to unknown source.CTA head & neck advanced atherosclerosis B ICA bifurcation and bulbs. B ICA 30% stenosis w/ pronounced irregularity. Proximal R Subclavian 50%. R VA origin 30%. L VA origin 50%. Mid to distal BA. No LVO.  MRI  Punctate L primary motor cortec and L subcortical parietal white matter infarcts  MR CS multilevel CX spondylosis w/ moderate and severe foraminal stenosis / narrowing C4-11  2D Echo pending.  Remains on aspirin and Plavix.  Seen by PT OT, they recommend outpatient OT.  EP has been consulted by neurology for consideration of implantable loop recorder.  Patient in the past has been considered for ICD.  Dr. Rayann Heman of EP had personally talked to the patient while I was in the room.  He strongly suggested that patient gets ICD instead of loop recorder.  Neurology wanted some sort of monitor/30-day monitor until he sees his cardiologist and gets his ICD placed.  This was offered to the patient by cardiology himself however patient declined this offer.  Patient now more interested in ICD than before.  Patient  was on aspirin already.  He is being discharged on both aspirin and Plavix.  He has been informed that he needs to stop aspirin after 3 weeks but continue Plavix afterwards.  He is also informed to hold his antihypertensives for at least 2 days as mentioned above.  Hyperlipidemia: Elevated LDL and low HDL.  Per my discussion with neurology, patient is currently in a trial for new drug for  hyperlipidemia but his lipids have not changed.  Patient is intolerant to statin.  Dr. Willaim Rayas has talked to his cardiologist as outpatient and they will take care of getting Repatha for him.   Discharge Diagnoses:  Principal Problem:   Acute ischemic stroke [facility]) Active Problems:   Dyslipidemia   Hypertrophic cardiomyopathy ([place])   Essential hypertension   CAD S/P percutaneous coronary angioplasty    Discharge Instructions  Discharge Instructions    Ambulatory referral to Occupational Therapy   Complete by: As directed    Ambulatory referral to Occupational Therapy   Complete by: As directed      Allergies as of 09/16/2020      Reactions   Antihistamines, Chlorpheniramine-type Other (See Comments)   Altered mental status   Pheniramine Other (See Comments)   Altered mental status   Statins Other (See Comments)   Leg cramps     Zetia [ezetimibe] Other (See Comments)   Leg cramps      Medication List    TAKE these medications   AMBULATORY NON FORMULARY MEDICATION Take 180 mg by mouth daily. Medication Name: bempedoic acid vs a placebo CLEAR Research Study drug provided   amLODipine 2.5 MG tablet Commonly known as: NORVASC TAKE 1 TABLET BY MOUTH EVERY DAY   aspirin EC 81 MG tablet Take 81 mg by mouth daily.   calcium carbonate 750 MG chewable tablet Commonly known as: TUMS EX Chew 2 tablets by mouth daily as needed for heartburn.   carvedilol 12.5 MG tablet Commonly known as: COREG TAKE 0.5 TABLETS (6.25 MG TOTAL) BY MOUTH 2 (TWO) TIMES DAILY.   clopidogrel 75 MG tablet Commonly known as: PLAVIX Take 1 tablet (75 mg total) by mouth daily. Start taking on: September 17, 2020   esomeprazole 20 MG capsule Commonly known as: NEXIUM Take 20 mg by mouth every morning.   lisinopril 40 MG tablet Commonly known as: ZESTRIL Take 1 tablet (40 mg total) by mouth daily.   Niacin Flush Free 500 MG Caps Generic drug: Inositol Niacinate Take 1,000 mg by mouth daily.    nitroGLYCERIN 0.4 MG SL tablet Commonly known as: NITROSTAT Place 1 tablet (0.4 mg total) under the tongue every 5 (five) minutes as needed for chest pain.   valACYclovir 500 MG tablet Commonly known as: VALTREX Take 1 tablet (500 mg total) by mouth daily as needed (fever blisters).       Follow-up Information    Evans Lance, MD Follow up.   Specialty: Cardiology Why: 09/21/2020 @ 9:30AM Contact information: [address] [place] [address] [phone]              Allergies  Allergen Reactions  . Antihistamines, Chlorpheniramine-Type Other (See Comments)    Altered mental status  . Pheniramine Other (See Comments)    Altered mental status  . Statins Other (See Comments)    Leg cramps    . Zetia [Ezetimibe] Other (See Comments)    Leg cramps    Consultations: Neurology and cardiology   Procedures/Studies: CT Angio Head W or  Wo Contrast  Result Date: 09/15/2020 CLINICAL DATA:  Right arm numbness.  Symptoms began 2-3 hours ago. EXAM: CT ANGIOGRAPHY HEAD AND NECK TECHNIQUE: Multidetector CT imaging of the head and neck was performed using the standard protocol during bolus administration of intravenous contrast. Multiplanar CT image reconstructions and MIPs were obtained to evaluate the vascular anatomy. Carotid stenosis measurements (when applicable) are obtained utilizing NASCET criteria, using the distal internal carotid diameter as the denominator. CONTRAST:  149mL OMNIPAQUE IOHEXOL 350 MG/ML SOLN COMPARISON:  None. FINDINGS: CT HEAD FINDINGS Brain: Mild age related volume loss. No sign of acute infarction, mass lesion, hemorrhage, hydrocephalus or extra-axial collection. Vascular: There is atherosclerotic calcification of the major vessels at the base of the brain. Skull: Negative Sinuses: Clear Orbits: Normal Review of the MIP images confirms the above findings CTA NECK FINDINGS Aortic arch: Aortic atherosclerosis. Branching pattern is  normal. No origin stenosis. Right carotid system: Innominate artery is widely patent. Common carotid artery is widely patent, with a few areas of nonstenotic plaque. At the carotid bifurcation and ICA bulb, there is advanced irregular soft and calcified plaque. Pronounced irregularity could serve as a source of embolic disease. Minimal diameter throughout the region measures 3.5 mm. Compared to a more distal cervical ICA diameter of 5 mm, this indicates a 30% stenosis. Left carotid system: Common carotid artery shows some atherosclerotic plaque but no stenosis proximal to the bifurcation region. At the carotid bifurcation and ICA bulb, there is soft and calcified plaque. As seen on the right, there is considerable irregularity which could serve as a source of embolic disease. Minimal diameter of 3.5 mm indicates a stenosis of 30%. Cervical ICA widely patent beyond that. Vertebral arteries: Soft and calcified plaque at the proximal right subclavian artery with 50% stenosis. Atherosclerotic plaque at the right vertebral artery origin with stenosis of 30%. Beyond that, the vessel is widely patent through the cervical region to the foramen magnum. Left subclavian artery shows some atherosclerotic change but no measurable stenosis. 50% stenosis of the left vertebral artery origin. Beyond that, the vessel is widely patent through the cervical region. Skeleton: Ordinary cervical spondylosis and facet osteoarthritis. Other neck: No mass or lymphadenopathy. Upper chest: Negative Review of the MIP images confirms the above findings CTA HEAD FINDINGS Anterior circulation: Both internal carotid arteries are widely patent through the skull base and siphon regions. Ordinary siphon atherosclerosis without stenosis greater than 30%. The anterior and middle cerebral vessels are patent without large or medium vessel occlusion or correctable proximal stenosis. Posterior circulation: Both vertebral arteries are patent through the  foramen magnum to the basilar. Narrowing of the mid to distal basilar artery with atherosclerotic irregularity. Flow is present in both superior cerebellar and posterior cerebral arteries. Right PCA receives most of it supply from the anterior circulation. Venous sinuses: Patent and normal. Anatomic variants: None significant. Review of the MIP images confirms the above findings IMPRESSION: 1. Advanced atherosclerotic disease at both carotid bifurcations and ICA bulbs. 30% stenosis of the internal carotid arteries on both sides. Pronounced irregularity could serve as a source of embolic disease. 2. 50% stenosis of the proximal right subclavian artery. 30% stenosis of the right vertebral artery origin. 50% stenosis of the left vertebral artery origin. Narrowing of the mid to distal basilar artery with atherosclerotic irregularity. Flow is present in both superior cerebellar and posterior cerebral arteries. 3. No intracranial large or medium vessel occlusion or correctable proximal stenosis. 4. No acute brain CT finding. Aortic Atherosclerosis (ICD10-I70.0). Electronically Signed  By: Nelson Chimes M.D.   On: 09/15/2020 12:13   DG Chest 2 View  Result Date: 09/15/2020 CLINICAL DATA:  Stroke, right-sided weakness EXAM: CHEST - 2 VIEW COMPARISON:  None. FINDINGS: Lungs are well expanded, symmetric, and clear. No pneumothorax or pleural effusion. Cardiac size within normal limits. Coronary artery stenting has been performed. Pulmonary vascularity is normal. Osseous structures are age-appropriate. No acute bone abnormality. IMPRESSION: No active cardiopulmonary disease. Electronically Signed   By: Fidela Salisbury MD   On: 09/15/2020 20:30   CT Angio Neck W and/or Wo Contrast  Result Date: 09/15/2020 CLINICAL DATA:  Right arm numbness.  Symptoms began 2-3 hours ago. EXAM: CT ANGIOGRAPHY HEAD AND NECK TECHNIQUE: Multidetector CT imaging of the head and neck was performed using the standard protocol during bolus  administration of intravenous contrast. Multiplanar CT image reconstructions and MIPs were obtained to evaluate the vascular anatomy. Carotid stenosis measurements (when applicable) are obtained utilizing NASCET criteria, using the distal internal carotid diameter as the denominator. CONTRAST:  131mL OMNIPAQUE IOHEXOL 350 MG/ML SOLN COMPARISON:  None. FINDINGS: CT HEAD FINDINGS Brain: Mild age related volume loss. No sign of acute infarction, mass lesion, hemorrhage, hydrocephalus or extra-axial collection. Vascular: There is atherosclerotic calcification of the major vessels at the base of the brain. Skull: Negative Sinuses: Clear Orbits: Normal Review of the MIP images confirms the above findings CTA NECK FINDINGS Aortic arch: Aortic atherosclerosis. Branching pattern is normal. No origin stenosis. Right carotid system: Innominate artery is widely patent. Common carotid artery is widely patent, with a few areas of nonstenotic plaque. At the carotid bifurcation and ICA bulb, there is advanced irregular soft and calcified plaque. Pronounced irregularity could serve as a source of embolic disease. Minimal diameter throughout the region measures 3.5 mm. Compared to a more distal cervical ICA diameter of 5 mm, this indicates a 30% stenosis. Left carotid system: Common carotid artery shows some atherosclerotic plaque but no stenosis proximal to the bifurcation region. At the carotid bifurcation and ICA bulb, there is soft and calcified plaque. As seen on the right, there is considerable irregularity which could serve as a source of embolic disease. Minimal diameter of 3.5 mm indicates a stenosis of 30%. Cervical ICA widely patent beyond that. Vertebral arteries: Soft and calcified plaque at the proximal right subclavian artery with 50% stenosis. Atherosclerotic plaque at the right vertebral artery origin with stenosis of 30%. Beyond that, the vessel is widely patent through the cervical region to the foramen magnum.  Left subclavian artery shows some atherosclerotic change but no measurable stenosis. 50% stenosis of the left vertebral artery origin. Beyond that, the vessel is widely patent through the cervical region. Skeleton: Ordinary cervical spondylosis and facet osteoarthritis. Other neck: No mass or lymphadenopathy. Upper chest: Negative Review of the MIP images confirms the above findings CTA HEAD FINDINGS Anterior circulation: Both internal carotid arteries are widely patent through the skull base and siphon regions. Ordinary siphon atherosclerosis without stenosis greater than 30%. The anterior and middle cerebral vessels are patent without large or medium vessel occlusion or correctable proximal stenosis. Posterior circulation: Both vertebral arteries are patent through the foramen magnum to the basilar. Narrowing of the mid to distal basilar artery with atherosclerotic irregularity. Flow is present in both superior cerebellar and posterior cerebral arteries. Right PCA receives most of it supply from the anterior circulation. Venous sinuses: Patent and normal. Anatomic variants: None significant. Review of the MIP images confirms the above findings IMPRESSION: 1. Advanced atherosclerotic disease at  both carotid bifurcations and ICA bulbs. 30% stenosis of the internal carotid arteries on both sides. Pronounced irregularity could serve as a source of embolic disease. 2. 50% stenosis of the proximal right subclavian artery. 30% stenosis of the right vertebral artery origin. 50% stenosis of the left vertebral artery origin. Narrowing of the mid to distal basilar artery with atherosclerotic irregularity. Flow is present in both superior cerebellar and posterior cerebral arteries. 3. No intracranial large or medium vessel occlusion or correctable proximal stenosis. 4. No acute brain CT finding. Aortic Atherosclerosis (ICD10-I70.0). Electronically Signed   By: Nelson Chimes M.D.   On: 09/15/2020 12:13   MR BRAIN WO  CONTRAST  Result Date: 09/16/2020 CLINICAL DATA:  Left hand weakness. Numbness in the fourth and fifth digits of the hand. EXAM: MRI HEAD WITHOUT CONTRAST TECHNIQUE: Multiplanar, multiecho pulse sequences of the brain and surrounding structures were obtained without intravenous contrast. COMPARISON:  CTA head and neck 09/15/2020 FINDINGS: Brain: Punctate areas of acute nonhemorrhagic infarct involves the left primary motor cortex and left subcortical parietal white matter. Hemorrhage or mass lesion is present. Minimal white matter changes are otherwise within normal limits for age. The ventricles are of normal size. No significant extraaxial fluid collection is present. The internal auditory canals are within normal limits. The brainstem and cerebellum are within normal limits. Vascular: Flow is present in the major intracranial arteries. Skull and upper cervical spine: The craniocervical junction is normal. Upper cervical spine is within normal limits. Marrow signal is unremarkable. Sinuses/Orbits: The paranasal sinuses and mastoid air cells are clear. The globes and orbits are within normal limits. IMPRESSION: 1. Punctate areas of acute nonhemorrhagic infarct involving the left primary motor cortex and left subcortical parietal white matter. The above was relayed via text pager to Dr. Leonel Ramsay On 09/16/2020 at 06:01 . Electronically Signed   By: San Morelle M.D.   On: 09/16/2020 06:01   MR CERVICAL SPINE WO CONTRAST  Result Date: 09/16/2020 CLINICAL DATA:  Right hand weakness and numbness.  Field EXAM: MRI CERVICAL SPINE WITHOUT CONTRAST TECHNIQUE: Multiplanar, multisequence MR imaging of the cervical spine was performed. No intravenous contrast was administered. COMPARISON:  CTA of the neck 10/13/1 FINDINGS: Alignment: No significant listhesis is present. Mild straightening of the cervical lordosis is stable. Vertebrae: Marrow signal and vertebral body heights are normal. Cord: Normal signal  is present in the cervical and upper thoracic spinal cord to the lowest imaged level T3-4. Posterior Fossa, vertebral arteries, paraspinal tissues: Craniocervical junction is normal. Disc levels: C2-3: Negative. C3-4: Facet and uncovertebral spurring results in mild left foraminal narrowing. C4-5: Uncovertebral and facet hypertrophy is present bilaterally. Moderate foraminal narrowing is worse on the left. The central canal is patent. C5-6: Uncovertebral and facet disease leads to severe left and moderate right foraminal narrowing. C6-7: Uncovertebral and facet spurring leads to severe foraminal narrowing bilaterally. The central canal is patent. C7-T1: Asymmetric left-sided facet hypertrophy contributes to moderate left foraminal narrowing. IMPRESSION: 1. Multilevel spondylosis of the cervical spine as described. 2. Moderate foraminal narrowing bilaterally at C4-5 is worse on the left. 3. Severe left and moderate right foraminal stenosis at C5-6. 4. Severe foraminal narrowing bilaterally at C6-7. 5. Moderate left foraminal narrowing at C7-T1. Electronically Signed   By: San Morelle M.D.   On: 09/16/2020 06:23   ECHOCARDIOGRAM COMPLETE  Result Date: 09/16/2020    ECHOCARDIOGRAM REPORT   Patient Name:   REFUGIO VANDEVOORDE [facility] Date of Exam: 09/16/2020 Medical Rec #:  [id number]  Height:       68.5 in Accession #:    [id number]      Weight:       172.6 lb Date of Birth:  [birth year]       BSA:          1.930 m Patient Age:    16 years        BP:           130/84 mmHg Patient Gender: M               HR:           77 bpm. Exam Location:  Inpatient Procedure: 2D Echo, Cardiac Doppler and Color Doppler Indications:    Stroke 434.91 / I163.9  History:        Patient has prior history of Echocardiogram examinations, most                 recent 07/14/2019. CAD, Signs/Symptoms:Murmur; Risk                 Factors:Hypertension and Dyslipidemia. GERD.  Sonographer:    Vickie Epley RDCS Referring Phys: [phone] JARED M  GARDNER  Sonographer Comments: Image acquisition challenging due to respiratory motion. IMPRESSIONS  1. Left ventricular ejection fraction, by estimation, is 65 to 70%. The left ventricle has normal function. The left ventricle has no regional wall motion abnormalities. There is moderate concentric left ventricular hypertrophy. Left ventricular diastolic parameters are consistent with Grade I diastolic dysfunction (impaired relaxation).  2. Right ventricular systolic function is normal. The right ventricular size is normal.  3. Left atrial size was moderately dilated.  4. The mitral valve is normal in structure. Trivial mitral valve regurgitation. No evidence of mitral stenosis.  5. The aortic valve is normal in structure. There is mild calcification of the aortic valve. There is mild thickening of the aortic valve. Aortic valve regurgitation is mild. No aortic stenosis is present.  6. The inferior vena cava is normal in size with greater than 50% respiratory variability, suggesting right atrial pressure of 3 mmHg. Conclusion(s)/Recommendation(s): No intracardiac source of embolism detected on this transthoracic study. A transesophageal echocardiogram is recommended to exclude cardiac source of embolism if clinically indicated. FINDINGS  Left Ventricle: Left ventricular ejection fraction, by estimation, is 65 to 70%. The left ventricle has normal function. The left ventricle has no regional wall motion abnormalities. The left ventricular internal cavity size was normal in size. There is  moderate concentric left ventricular hypertrophy. Left ventricular diastolic parameters are consistent with Grade I diastolic dysfunction (impaired relaxation). Normal left ventricular filling pressure. Right Ventricle: The right ventricular size is normal. No increase in right ventricular wall thickness. Right ventricular systolic function is normal. Left Atrium: Left atrial size was moderately dilated. Right Atrium: Right atrial  size was normal in size. Pericardium: There is no evidence of pericardial effusion. Mitral Valve: The mitral valve is normal in structure. Trivial mitral valve regurgitation. No evidence of mitral valve stenosis. Tricuspid Valve: The tricuspid valve is normal in structure. Tricuspid valve regurgitation is mild . No evidence of tricuspid stenosis. Aortic Valve: The aortic valve is normal in structure. There is mild calcification of the aortic valve. There is mild thickening of the aortic valve. Aortic valve regurgitation is mild. No aortic stenosis is present. Pulmonic Valve: The pulmonic valve was normal in structure. Pulmonic valve regurgitation is not visualized. No evidence of pulmonic stenosis. Aorta: The aortic root is normal in size and structure. Venous:  The inferior vena cava is normal in size with greater than 50% respiratory variability, suggesting right atrial pressure of 3 mmHg. IAS/Shunts: No atrial level shunt detected by color flow Doppler.  LEFT VENTRICLE PLAX 2D LVIDd:         5.80 cm      Diastology LVIDs:         4.30 cm      LV e' medial:    3.81 cm/s LV PW:         1.20 cm      LV E/e' medial:  10.1 LV IVS:        1.20 cm      LV e' lateral:   5.11 cm/s LVOT diam:     2.30 cm      LV E/e' lateral: 7.6 LV SV:         143 LV SV Index:   74 LVOT Area:     4.15 cm  LV Volumes (MOD) LV vol d, MOD A2C: 135.0 ml LV vol d, MOD A4C: 144.0 ml LV vol s, MOD A2C: 51.7 ml LV vol s, MOD A4C: 67.9 ml LV SV MOD A2C:     83.3 ml LV SV MOD A4C:     144.0 ml LV SV MOD BP:      80.4 ml RIGHT VENTRICLE RV S prime:     10.60 cm/s TAPSE (M-mode): 2.1 cm LEFT ATRIUM             Index       RIGHT ATRIUM           Index LA diam:        4.50 cm 2.33 cm/m  RA Area:     15.10 cm LA Vol (A2C):   53.5 ml 27.72 ml/m RA Volume:   40.90 ml  21.19 ml/m LA Vol (A4C):   30.9 ml 16.01 ml/m LA Biplane Vol: 42.4 ml 21.97 ml/m  AORTIC VALVE LVOT Vmax:   175.00 cm/s LVOT Vmean:  131.000 cm/s LVOT VTI:    0.344 m  AORTA Ao Root  diam: 3.20 cm MITRAL VALVE MV Area (PHT): 2.71 cm    SHUNTS MV Decel Time: 280 msec    Systemic VTI:  0.34 m MV E velocity: 38.60 cm/s  Systemic Diam: 2.30 cm MV A velocity: 83.10 cm/s MV E/A ratio:  0.46 Ena Dawley MD Electronically signed by Ena Dawley MD Signature Date/Time: 09/16/2020/1:32:59 PM    Final       Discharge Exam: Vitals:   09/16/20 0738 09/16/20 1201  BP: 130/84 133/88  Pulse: 64 81  Resp: 18 18  Temp: 98 F (36.7 C) 98.3 F (36.8 C)  SpO2: 96% 97%   Vitals:   09/15/20 2318 09/16/20 0359 09/16/20 0738 09/16/20 1201  BP: (!) 162/91 (!) 133/91 130/84 133/88  Pulse: 60 66 64 81  Resp: 18 20 18 18   Temp:  97.8 F (36.6 C) 98 F (36.7 C) 98.3 F (36.8 C)  TempSrc: Oral Oral Oral Oral  SpO2: 97% 97% 96% 97%    General: Pt is alert, awake, not in acute distress Cardiovascular: RRR, S1/S2 +, no rubs, no gallops Respiratory: CTA bilaterally, no wheezing, no rhonchi Abdominal: Soft, NT, ND, bowel sounds + Extremities: no edema, no cyanosis    The results of significant diagnostics from this hospitalization (including imaging, microbiology, ancillary and laboratory) are listed below for reference.     Microbiology: Recent Results (from the past 240 hour(s))  Respiratory Panel by  RT PCR (Flu A&B, Covid) - Nasopharyngeal Swab     Status: None   Collection Time: 09/15/20 11:52 AM   Specimen: Nasopharyngeal Swab  Result Value Ref Range Status   SARS Coronavirus 2 by RT PCR NEGATIVE NEGATIVE Final    Comment: (NOTE) SARS-CoV-2 target nucleic acids are NOT DETECTED.  The SARS-CoV-2 RNA is generally detectable in upper respiratoy specimens during the acute phase of infection. The lowest concentration of SARS-CoV-2 viral copies this assay can detect is 131 copies/mL. A negative result does not preclude SARS-Cov-2 infection and should not be used as the sole basis for treatment or other patient management decisions. A negative result may occur with   improper specimen collection/handling, submission of specimen other than nasopharyngeal swab, presence of viral mutation(s) within the areas targeted by this assay, and inadequate number of viral copies (<131 copies/mL). A negative result must be combined with clinical observations, patient history, and epidemiological information. The expected result is Negative.  Fact Sheet for Patients:  PinkCheek.be  Fact Sheet for Healthcare Providers:  GravelBags.it  This test is no t yet approved or cleared by the [place] FDA and  has been authorized for detection and/or diagnosis of SARS-CoV-2 by FDA under an Emergency Use Authorization (EUA). This EUA will remain  in effect (meaning this test can be used) for the duration of the COVID-19 declaration under Section 564(b)(1) of the Act, 21 U.S.C. section 360bbb-3(b)(1), unless the authorization is terminated or revoked sooner.     Influenza A by PCR NEGATIVE NEGATIVE Final   Influenza B by PCR NEGATIVE NEGATIVE Final    Comment: (NOTE) The Xpert Xpress SARS-CoV-2/FLU/RSV assay is intended as an aid in  the diagnosis of influenza from Nasopharyngeal swab specimens and  should not be used as a sole basis for treatment. Nasal washings and  aspirates are unacceptable for Xpert Xpress SARS-CoV-2/FLU/RSV  testing.  Fact Sheet for Patients: PinkCheek.be  Fact Sheet for Healthcare Providers: GravelBags.it  This test is not yet approved or cleared by the [place] FDA and  has been authorized for detection and/or diagnosis of SARS-CoV-2 by  FDA under an Emergency Use Authorization (EUA). This EUA will remain  in effect (meaning this test can be used) for the duration of the  Covid-19 declaration under Section 564(b)(1) of the Act, 21  U.S.C. section 360bbb-3(b)(1), unless the authorization is  terminated or  revoked. Performed at [facility], [address] [place], [place] [address]      Labs: BNP (last 3 results) No results for input(s): BNP in the last 8760 hours. Basic Metabolic Panel: Recent Labs  Lab 09/15/20 1131  NA 141  K 4.3  CL 105  CO2 27  GLUCOSE 98  BUN 14  CREATININE 1.20  CALCIUM 9.3   Liver Function Tests: Recent Labs  Lab 09/15/20 1131  AST 25  ALT 25  ALKPHOS 52  BILITOT 1.9*  PROT 6.9  ALBUMIN 4.5   No results for input(s): LIPASE, AMYLASE in the last 168 hours. No results for input(s): AMMONIA in the last 168 hours. CBC: Recent Labs  Lab 09/15/20 1131  WBC 6.7  NEUTROABS 5.0  HGB 16.7  HCT 49.1  MCV 88.8  PLT 172   Cardiac Enzymes: No results for input(s): CKTOTAL, CKMB, CKMBINDEX, TROPONINI in the last 168 hours. BNP: Invalid input(s): POCBNP CBG: Recent Labs  Lab 09/15/20 1124  GLUCAP 94   D-Dimer No results for input(s): DDIMER in the last 72 hours. Hgb  A1c No results for input(s): HGBA1C in the last 72 hours. Lipid Profile Recent Labs    09/16/20 0428  CHOL 208*  HDL 27*  LDLCALC 133*  TRIG 238*  CHOLHDL 7.7   Thyroid function studies No results for input(s): TSH, T4TOTAL, T3FREE, THYROIDAB in the last 72 hours.  Invalid input(s): FREET3 Anemia work up No results for input(s): VITAMINB12, FOLATE, FERRITIN, TIBC, IRON, RETICCTPCT in the last 72 hours. Urinalysis    Component Value Date/Time   COLORURINE YELLOW 09/15/2020 [place] 09/15/2020 1131   LABSPEC <1.005 (L) 09/15/2020 1131   PHURINE 6.5 09/15/2020 1131   GLUCOSEU NEGATIVE 09/15/2020 1131   [place] (A) 09/15/2020 [place] 09/15/2020 [place] 09/15/2020 1131   PROTEINUR NEGATIVE 09/15/2020 1131   NITRITE NEGATIVE 09/15/2020 [place] 09/15/2020 1131   Sepsis Labs Invalid input(s): PROCALCITONIN,  WBC,  LACTICIDVEN Microbiology Recent Results (from the past 240  hour(s))  Respiratory Panel by RT PCR (Flu A&B, Covid) - Nasopharyngeal Swab     Status: None   Collection Time: 09/15/20 11:52 AM   Specimen: Nasopharyngeal Swab  Result Value Ref Range Status   SARS Coronavirus 2 by RT PCR NEGATIVE NEGATIVE Final    Comment: (NOTE) SARS-CoV-2 target nucleic acids are NOT DETECTED.  The SARS-CoV-2 RNA is generally detectable in upper respiratoy specimens during the acute phase of infection. The lowest concentration of SARS-CoV-2 viral copies this assay can detect is 131 copies/mL. A negative result does not preclude SARS-Cov-2 infection and should not be used as the sole basis for treatment or other patient management decisions. A negative result may occur with  improper specimen collection/handling, submission of specimen other than nasopharyngeal swab, presence of viral mutation(s) within the areas targeted by this assay, and inadequate number of viral copies (<131 copies/mL). A negative result must be combined with clinical observations, patient history, and epidemiological information. The expected result is Negative.  Fact Sheet for Patients:  PinkCheek.be  Fact Sheet for Healthcare Providers:  GravelBags.it  This test is no t yet approved or cleared by the [place] FDA and  has been authorized for detection and/or diagnosis of SARS-CoV-2 by FDA under an Emergency Use Authorization (EUA). This EUA will remain  in effect (meaning this test can be used) for the duration of the COVID-19 declaration under Section 564(b)(1) of the Act, 21 U.S.C. section 360bbb-3(b)(1), unless the authorization is terminated or revoked sooner.     Influenza A by PCR NEGATIVE NEGATIVE Final   Influenza B by PCR NEGATIVE NEGATIVE Final    Comment: (NOTE) The Xpert Xpress SARS-CoV-2/FLU/RSV assay is intended as an aid in  the diagnosis of influenza from Nasopharyngeal swab specimens and  should not  be used as a sole basis for treatment. Nasal washings and  aspirates are unacceptable for Xpert Xpress SARS-CoV-2/FLU/RSV  testing.  Fact Sheet for Patients: PinkCheek.be  Fact Sheet for Healthcare Providers: GravelBags.it  This test is not yet approved or cleared by the [place] FDA and  has been authorized for detection and/or diagnosis of SARS-CoV-2 by  FDA under an Emergency Use Authorization (EUA). This EUA will remain  in effect (meaning this test can be used) for the duration of the  Covid-19 declaration under Section 564(b)(1) of the Act, 21  U.S.C. section 360bbb-3(b)(1), unless the authorization is  terminated or revoked. Performed at [facility], [address], [place], [place] [address]  Time coordinating discharge: Over 30 minutes  SIGNED:   Darliss Cheney, MD  [facility] 09/16/2020, 2:25 PM  If 7PM-7AM, please contact night-coverage [email or web address]

## 2020-09-16 NOTE — Evaluation (Signed)
Physical Therapy Evaluation Patient Details Name: Brandon Gonzales MRN: [id number] DOB: [birth year] Today's Date: 09/16/2020   History of Present Illness  69yo male who woke up with reduced R hand dexterity and numbness in R 4th and 5th digits. MRI shows punctuate areas of acute non-hemorrhagic infarct in L primary motor cortex and L subcortical parietal white matter. Neuro services also concerned with possible ulnar or C8 radiculopathy. PMH cardiomyopathy, HTN, dyslipidemia, CA, CAD, PCI-S, knee surgery, elbow surgery, cardiac cath  Clinical Impression   Patient received in bed, very pleasant and cooperative with PT, reports that his only concern at this point is his R hand numbness and weakness. Able to mobilize well on an independent basis without signs of significant functional weakness or unsteadiness. RAM testing negative. Altered sensation still present in R ulnar nerve vs C8 pattern. Left in bed with all needs met, spouse present- definitely OK for him to be independent with mobility in room and to walk in hallway with spouse. Would benefit from outpatient OT for f/u on R hand impairment. PT signing off at this time- thank you for the opportunity to participate in his care!     Follow Up Recommendations Other (comment) (outpatient OT for his R hand)    Equipment Recommendations  None recommended by PT    Recommendations for Other Services       Precautions / Restrictions Precautions Precautions: None Restrictions Weight Bearing Restrictions: No      Mobility  Bed Mobility Overal bed mobility: Independent                Transfers Overall transfer level: Independent Equipment used: None                Ambulation/Gait Ambulation/Gait assistance: Independent Gait Distance (Feet): 200 Feet Assistive device: None Gait Pattern/deviations: WFL(Within Functional Limits);Step-through pattern Gait velocity: mildly decreased   General Gait Details: no significant  gait or balance deficits noted with dynamic tasks, able to navigate challenging environments in hallway well without LOB  Stairs Stairs: Yes Stairs assistance: Modified independent (Device/Increase time) Stair Management: One rail Right Number of Stairs: 5 General stair comments: safe and steady with railing  Wheelchair Mobility    Modified Rankin (Stroke Patients Only)       Balance Overall balance assessment: No apparent balance deficits (not formally assessed)                                           Pertinent Vitals/Pain Pain Assessment: No/denies pain    Home Living Family/patient expects to be discharged to:: Private residence Living Arrangements: Spouse/significant other Available Help at Discharge: Family;Available 24 hours/day Type of Home: House Home Access: Level entry     Home Layout: Multi-level Home Equipment: None      Prior Function Level of Independence: Independent         Comments: 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[place]; is more on the computer side/in the background     Journalist, newspaper        Extremity/Trunk Assessment   Upper Extremity Assessment Upper Extremity Assessment: Defer to OT evaluation    Lower Extremity Assessment Lower Extremity Assessment: Overall WFL for tasks assessed    Cervical / Trunk Assessment Cervical / Trunk Assessment: Normal  Communication   Communication: No difficulties  Cognition Arousal/Alertness: Awake/alert Behavior During Therapy: WFL for tasks  assessed/performed Overall Cognitive Status: Within Functional Limits for tasks assessed                                        General Comments General comments (skin integrity, edema, etc.): VSS on RA    Exercises     Assessment/Plan    PT Assessment Patent does not need any further PT services  PT Problem List Decreased strength;Impaired sensation       PT Treatment  Interventions Gait training;Stair training;Therapeutic exercise    PT Goals (Current goals can be found in the Care Plan section)  Acute Rehab PT Goals Patient Stated Goal: go home today PT Goal Formulation: With patient/family Time For Goal Achievement: 09/30/20 Potential to Achieve Goals: Good    Frequency Other (Comment) (Eval only)   Barriers to discharge        Co-evaluation               AM-PAC PT "6 Clicks" Mobility  Outcome Measure Help needed turning from your back to your side while in a flat bed without using bedrails?: None Help needed moving from lying on your back to sitting on the side of a flat bed without using bedrails?: None Help needed moving to and from a bed to a chair (including a wheelchair)?: None Help needed standing up from a chair using your arms (e.g., wheelchair or bedside chair)?: None Help needed to walk in hospital room?: None Help needed climbing 3-5 steps with a railing? : None 6 Click Score: 24    End of Session   Activity Tolerance: Patient tolerated treatment well Patient left: in bed;with call bell/phone within reach;with family/visitor present Nurse Communication: Mobility status PT Visit Diagnosis: Muscle weakness (generalized) (M62.81)    Time: 1610-9604 PT Time Calculation (min) (ACUTE ONLY): 15 min   Charges:   PT Evaluation $PT Eval Low Complexity: 1 Low          Windell Norfolk, DPT, PN1   Supplemental Physical Therapist [place]    Pager [phone] Acute Rehab Office [phone]

## 2020-09-16 NOTE — Plan of Care (Signed)
  Problem: Education: Goal: Knowledge of General Education information will improve Description: Including pain rating scale, medication(s)/side effects and non-pharmacologic comfort measures 09/16/2020 1510 by Myriam Forehand, RN Outcome: Adequate for Discharge 09/16/2020 1509 by Myriam Forehand, RN Outcome: Adequate for Discharge   Problem: Health Behavior/Discharge Planning: Goal: Ability to manage health-related needs will improve 09/16/2020 1510 by Myriam Forehand, RN Outcome: Adequate for Discharge 09/16/2020 1509 by Myriam Forehand, RN Outcome: Adequate for Discharge   Problem: Clinical Measurements: Goal: Ability to maintain clinical measurements within normal limits will improve 09/16/2020 1510 by Myriam Forehand, RN Outcome: Adequate for Discharge 09/16/2020 1509 by Myriam Forehand, RN Outcome: Adequate for Discharge Goal: Will remain free from infection 09/16/2020 1510 by Myriam Forehand, RN Outcome: Adequate for Discharge 09/16/2020 1509 by Myriam Forehand, RN Outcome: Adequate for Discharge Goal: Diagnostic test results will improve 09/16/2020 1510 by Myriam Forehand, RN Outcome: Adequate for Discharge 09/16/2020 1509 by Myriam Forehand, RN Outcome: Adequate for Discharge Goal: Respiratory complications will improve 09/16/2020 1510 by Myriam Forehand, RN Outcome: Adequate for Discharge 09/16/2020 1509 by Myriam Forehand, RN Outcome: Adequate for Discharge Goal: Cardiovascular complication will be avoided 09/16/2020 1510 by Myriam Forehand, RN Outcome: Adequate for Discharge 09/16/2020 1509 by Myriam Forehand, RN Outcome: Adequate for Discharge   Problem: Activity: Goal: Risk for activity intolerance will decrease 09/16/2020 1510 by Myriam Forehand, RN Outcome: Adequate for Discharge 09/16/2020 1509 by Myriam Forehand, RN Outcome: Adequate for Discharge   Problem: Nutrition: Goal: Adequate nutrition will be maintained 09/16/2020 1510 by Myriam Forehand, RN Outcome: Adequate for  Discharge 09/16/2020 1509 by Myriam Forehand, RN Outcome: Adequate for Discharge   Problem: Coping: Goal: Level of anxiety will decrease 09/16/2020 1510 by Myriam Forehand, RN Outcome: Adequate for Discharge 09/16/2020 1509 by Myriam Forehand, RN Outcome: Adequate for Discharge   Problem: Elimination: Goal: Will not experience complications related to bowel motility 09/16/2020 1510 by Myriam Forehand, RN Outcome: Adequate for Discharge 09/16/2020 1509 by Myriam Forehand, RN Outcome: Adequate for Discharge Goal: Will not experience complications related to urinary retention 09/16/2020 1510 by Myriam Forehand, RN Outcome: Adequate for Discharge 09/16/2020 1509 by Myriam Forehand, RN Outcome: Adequate for Discharge   Problem: Pain Managment: Goal: General experience of comfort will improve 09/16/2020 1510 by Myriam Forehand, RN Outcome: Adequate for Discharge 09/16/2020 1509 by Myriam Forehand, RN Outcome: Adequate for Discharge   Problem: Safety: Goal: Ability to remain free from injury will improve 09/16/2020 1510 by Myriam Forehand, RN Outcome: Adequate for Discharge 09/16/2020 1509 by Myriam Forehand, RN Outcome: Adequate for Discharge   Problem: Skin Integrity: Goal: Risk for impaired skin integrity will decrease 09/16/2020 1510 by Myriam Forehand, RN Outcome: Adequate for Discharge 09/16/2020 1509 by Myriam Forehand, RN Outcome: Adequate for Discharge

## 2020-09-16 NOTE — Care Management Obs Status (Signed)
[place] NOTIFICATION   Patient Details  Name: Brandon Gonzales MRN: [id number] Date of Birth: 03-Dec-1951   Medicare Observation Status Notification Given:  Yes    Angelita Ingles, RN 09/16/2020, 3:22 PM

## 2020-09-16 NOTE — Discharge Instructions (Signed)
Rehabilitation After a Stroke, Adult A stroke causes damage to the brain cells, which can affect your ability to walk, talk, or remember things. The impact of a stroke is different for everyone, and so is recovery. Some people have progress during the first few days after treatment. Others may take weeks or longer to make progress. Stroke rehabilitation includes a variety of treatments to help you recover and promote your independence after a stroke. You may not be able do everything that you did before the stroke, but you can learn ways to manage your lifestyle and be as independent as possible. Rehabilitation will start as soon as you are able to participate after your stroke, and it involves care from a team that may include:  Family and friends. Your loved ones know you best and can be very helpful in your recovery.  Physicians.  Nurses.  Physical and occupational therapists.  Speech-language therapists.  A nutritionist.  A psychologist.  A Education officer, museum. Keep open communication with all members of your care team. Share your medical records if needed, and take notes about each provider's recommendations. What is physical therapy? Physical therapists (PTs) help you to improve your coordination, balance, and muscle strength. Physical therapy may involve:  Range of motion exercises.  Help to move between lying, sitting, and standing positions.  Walking with a cane or walker, if needed.  Help using stairs. What is occupational therapy? Occupational therapists (OTs) help you rebuild your ability to do everyday tasks, such as brushing your teeth, going to the bathroom, eating, and getting dressed. Occupational therapy may also help with:  Vision. Visual scanning is a technique that is used to prevent falls.  Memory and cognitive training. This therapy includes problem-solving techniques and relearning tasks like making a phone call.  Fine muscle movements such as buttoning a shirt  or picking up small objects. What is speech therapy? Speech-language therapists help you communicate. After a stroke, you may have problems understanding what people are saying, or you may have trouble writing, speaking, or finding the right word for what you want to say. You may also need speech therapy if you have difficulty swallowing while eating and drinking. Examples of speech-language therapies include:  Techniques to strengthen muscles used in swallowing.  Naming objects or describing pictures. This helps retrain the brain to recognize and remember words.  Exercises to strengthen the muscles involved in talking, including your tongue and lips.  Exercises to retrain your brain in understanding what you read and hear. How often will I need therapy? Therapy will begin as soon as you are able to participate, which is often within the first few days after a stroke. Sessions will be frequent at first. For example, you may have therapy 2-3 hours a day on most days of the week during the first few months. The intensity depends on the type and severity of your stroke. You may need therapy for several months. Therapy may take place in the hospital, at a rehabilitation center, or in your home. Are there any side effects of therapy? Therapy is safe and is usually well-tolerated. You may feel physically and mentally tired after therapy, especially during the first few weeks. Rest before therapy sessions if you need to so you can get the most out of your rehabilitation. Follow these instructions at home:  Involve your family and friends in your recovery, if possible. Having another person to encourage you is beneficial.  Follow instructions from your speech-language therapist, nutritionist, or health care  provider about what you can safely eat and drink. Eat healthy foods. If your ability to swallow was affected by the stroke, you may need to take steps to avoid choking, such as: ? Taking small bites  when eating. ? Eating foods that are soft or pureed. ? Drinking liquids that have been thickened.  Maintain social connections and interactions with friends, family, and community groups. This is an important part of your recovery. Communication challenges and physical challenges may cause you to feel isolated after a stroke.  Consider joining a support group that allows you to talk about the impact of stroke on your life. A psychologist or counselor may be recommended. Your emotional recovery from stroke is just as important as your physical recovery.  Keep all follow-up visits as told by your health care providers. This is important. Summary  Stroke rehabilitation includes a variety of treatments to help you recover and promote your independence after a stroke.  Rehabilitation will start as soon as you are able to participate after your stroke, and it includes care from a team of experts.  The intensity of therapy depends on the type and severity of your stroke. You may need therapy for several months. This information is not intended to replace advice given to you by your health care provider. Make sure you discuss any questions you have with your health care provider. Document Revised: 03/12/2019 Document Reviewed: 11/21/2016 Elsevier Patient Education  [place].

## 2020-09-16 NOTE — TOC Progression Note (Signed)
Transition of Care [facility]) - Progression Note    Patient Details  Name: Brandon Gonzales MRN: [id number] Date of Birth: 08/16/1951  [facility] CM/SW Contact  Angelita Ingles, RN Phone Number: [phone]  09/16/2020, 2:21 PM  Clinical Narrative:    CM consulted for outpatient OT referral. Patient agrees that he has transportation to outpatient therapy. OT has been set up . Outpatient therapy will notify patient with details of therapy. CM will sign off.        Expected Discharge Plan and Services                                                 Social Determinants of Health (SDOH) Interventions    Readmission Risk Interventions No flowsheet data found.

## 2020-09-16 NOTE — Plan of Care (Signed)
Problem: Education: Goal: Knowledge of General Education information will improve Description: Including pain rating scale, medication(s)/side effects and non-pharmacologic comfort measures 09/16/2020 1510 by Myriam Forehand, RN Outcome: Adequate for Discharge 09/16/2020 1510 by Myriam Forehand, RN Outcome: Adequate for Discharge 09/16/2020 1510 by Myriam Forehand, RN Outcome: Adequate for Discharge 09/16/2020 1509 by Myriam Forehand, RN Outcome: Adequate for Discharge   Problem: Health Behavior/Discharge Planning: Goal: Ability to manage health-related needs will improve 09/16/2020 1510 by Myriam Forehand, RN Outcome: Adequate for Discharge 09/16/2020 1510 by Myriam Forehand, RN Outcome: Adequate for Discharge 09/16/2020 1510 by Myriam Forehand, RN Outcome: Adequate for Discharge 09/16/2020 1509 by Myriam Forehand, RN Outcome: Adequate for Discharge   Problem: Clinical Measurements: Goal: Ability to maintain clinical measurements within normal limits will improve 09/16/2020 1510 by Myriam Forehand, RN Outcome: Adequate for Discharge 09/16/2020 1510 by Myriam Forehand, RN Outcome: Adequate for Discharge 09/16/2020 1510 by Myriam Forehand, RN Outcome: Adequate for Discharge 09/16/2020 1509 by Myriam Forehand, RN Outcome: Adequate for Discharge Goal: Will remain free from infection 09/16/2020 1510 by Myriam Forehand, RN Outcome: Adequate for Discharge 09/16/2020 1510 by Myriam Forehand, RN Outcome: Adequate for Discharge 09/16/2020 1510 by Myriam Forehand, RN Outcome: Adequate for Discharge 09/16/2020 1509 by Myriam Forehand, RN Outcome: Adequate for Discharge Goal: Diagnostic test results will improve 09/16/2020 1510 by Myriam Forehand, RN Outcome: Adequate for Discharge 09/16/2020 1510 by Myriam Forehand, RN Outcome: Adequate for Discharge 09/16/2020 1510 by Myriam Forehand, RN Outcome: Adequate for Discharge 09/16/2020 1509 by Myriam Forehand, RN Outcome: Adequate for Discharge Goal: Respiratory  complications will improve 09/16/2020 1510 by Myriam Forehand, RN Outcome: Adequate for Discharge 09/16/2020 1510 by Myriam Forehand, RN Outcome: Adequate for Discharge 09/16/2020 1510 by Myriam Forehand, RN Outcome: Adequate for Discharge 09/16/2020 1509 by Myriam Forehand, RN Outcome: Adequate for Discharge Goal: Cardiovascular complication will be avoided 09/16/2020 1510 by Myriam Forehand, RN Outcome: Adequate for Discharge 09/16/2020 1510 by Myriam Forehand, RN Outcome: Adequate for Discharge 09/16/2020 1510 by Myriam Forehand, RN Outcome: Adequate for Discharge 09/16/2020 1509 by Myriam Forehand, RN Outcome: Adequate for Discharge   Problem: Activity: Goal: Risk for activity intolerance will decrease 09/16/2020 1510 by Myriam Forehand, RN Outcome: Adequate for Discharge 09/16/2020 1510 by Myriam Forehand, RN Outcome: Adequate for Discharge 09/16/2020 1510 by Myriam Forehand, RN Outcome: Adequate for Discharge 09/16/2020 1509 by Myriam Forehand, RN Outcome: Adequate for Discharge   Problem: Nutrition: Goal: Adequate nutrition will be maintained 09/16/2020 1510 by Myriam Forehand, RN Outcome: Adequate for Discharge 09/16/2020 1510 by Myriam Forehand, RN Outcome: Adequate for Discharge 09/16/2020 1510 by Myriam Forehand, RN Outcome: Adequate for Discharge 09/16/2020 1509 by Myriam Forehand, RN Outcome: Adequate for Discharge   Problem: Coping: Goal: Level of anxiety will decrease 09/16/2020 1510 by Myriam Forehand, RN Outcome: Adequate for Discharge 09/16/2020 1510 by Myriam Forehand, RN Outcome: Adequate for Discharge 09/16/2020 1510 by Myriam Forehand, RN Outcome: Adequate for Discharge 09/16/2020 1509 by Myriam Forehand, RN Outcome: Adequate for Discharge   Problem: Elimination: Goal: Will not experience complications related to bowel motility 09/16/2020 1510 by Myriam Forehand, RN Outcome: Adequate for Discharge 09/16/2020 1510 by Myriam Forehand, RN Outcome: Adequate for Discharge 09/16/2020  1510 by Myriam Forehand, RN Outcome: Adequate for Discharge 09/16/2020 1509 by Myriam Forehand, RN Outcome: Adequate for Discharge Goal: Will not experience complications related to urinary retention 09/16/2020 1510 by Myriam Forehand, RN Outcome: Adequate for Discharge 09/16/2020 1510 by Myriam Forehand, RN Outcome: Adequate for Discharge 09/16/2020 1510 by Myriam Forehand, RN Outcome: Adequate for Discharge 09/16/2020  1509 by Myriam Forehand, RN Outcome: Adequate for Discharge   Problem: Pain Managment: Goal: General experience of comfort will improve 09/16/2020 1510 by Myriam Forehand, RN Outcome: Adequate for Discharge 09/16/2020 1510 by Myriam Forehand, RN Outcome: Adequate for Discharge 09/16/2020 1510 by Myriam Forehand, RN Outcome: Adequate for Discharge 09/16/2020 1509 by Myriam Forehand, RN Outcome: Adequate for Discharge   Problem: Safety: Goal: Ability to remain free from injury will improve 09/16/2020 1510 by Myriam Forehand, RN Outcome: Adequate for Discharge 09/16/2020 1510 by Myriam Forehand, RN Outcome: Adequate for Discharge 09/16/2020 1510 by Myriam Forehand, RN Outcome: Adequate for Discharge 09/16/2020 1509 by Myriam Forehand, RN Outcome: Adequate for Discharge   Problem: Skin Integrity: Goal: Risk for impaired skin integrity will decrease 09/16/2020 1510 by Myriam Forehand, RN Outcome: Adequate for Discharge 09/16/2020 1510 by Myriam Forehand, RN Outcome: Adequate for Discharge 09/16/2020 1510 by Myriam Forehand, RN Outcome: Adequate for Discharge 09/16/2020 1509 by Myriam Forehand, RN Outcome: Adequate for Discharge

## 2020-09-16 NOTE — Consult Note (Signed)
Neurology Consultation Reason for Consult: Right hand weakness and numbness Referring Physician: Sheran Luz  CC: Right hand numbness  History is obtained from: Patient  HPI: Brandon Gonzales is a 69 y.o. male who was in his normal state of health last night when he went to bed around 10 PM.  He then woke up in the first time he tried to use his hand with any degree of dexterity he noticed that he was dropping things.  It was at that point that he realized that he had some numbness in the fourth and fifth digit of that hand as well.  Due to this he presented to [place] where he was evaluated none for concern for stroke he was admitted hospitalist service for further evaluation  He denies any elbow pain, neck pain, recent injuries.   LKW: 10/12, 10 PM tpa given?: no, out of window  ROS: A 14 point ROS was performed and is negative except as noted in the HPI.  Past Medical History:  Diagnosis Date  . Arthritis   . Cancer (HCC)    SKIN CA  SQUAMOUS CELL  . Coronary artery disease    a. 2007 Stent to LAD, PTCA D1;  b. DES to LAD & LCx 10/13 in the setting of STEMI.  [name] Kitchen Dyslipidemia    a. Statin and zetia-intolerant. On niacin.  [name] Kitchen GERD (gastroesophageal reflux disease)   . Heart murmur   . Hypertension   . Hypertrophic cardiomyopathy ([place])    a. 09/2012 Echo: EF 55%, mid to apical anterolateral, posterior, apical anterior, apical HK, Gr1 DD, turbulence across LVOT suggesting degree of obstruction, mild MR, mildly dil LA.     Family History  Problem Relation Age of Onset  . Heart attack Mother 52  . Heart disease Mother      Social History:  reports that he has never smoked. He has never used smokeless tobacco. He reports that he does not drink alcohol and does not use drugs.   Exam: Current vital signs: BP (!) 162/91 (BP Location: Left Arm)   Pulse 60   Temp 97.6 F (36.4 C) (Oral)   Resp 18   SpO2 97%  Vital signs in last 24 hours: Temp:  [97.6 F (36.4  C)-98.4 F (36.9 C)] 97.6 F (36.4 C) (10/13 2318) Pulse Rate:  [59-78] 60 (10/13 2318) Resp:  [12-21] 18 (10/13 2318) BP: (145-175)/(80-108) 162/91 (10/13 2318) SpO2:  [95 %-100 %] 97 % (10/13 2318)   Physical Exam  Constitutional: Appears well-developed and well-nourished.  Psych: Affect appropriate to situation Eyes: No scleral injection HENT: No OP obstrucion MSK: no joint deformities.  Cardiovascular: Normal rate and regular rhythm.  Respiratory: Effort normal, non-labored breathing GI: Soft.  No distension. There is no tenderness.  Skin: WDI  Neuro: Mental Status: Patient is awake, alert, oriented to person, place, month, year, and situation. Patient is able to give a clear and coherent history. No signs of aphasia or neglect Cranial Nerves: II: Visual Fields are full. Pupils are equal, round, and reactive to light.   III,IV, VI: EOMI without ptosis or diploplia.  V: Facial sensation is symmetric to temperature VII: Facial movement is asymmetric with decreased nasolabial fold on the right, this is also present in his epic picture and he states that he has an "crooked smile" VIII: hearing is intact to voice X: Uvula elevates symmetrically XI: Shoulder shrug is symmetric. XII: tongue is midline without atrophy or fasciculations.  Motor: Tone is normal.  Bulk is normal. 5/5 strength was present in all four extremities.  He has decreased interossei on the right, also question if he has some mild APB weakness in his right hand. Sensory: Sensation is diminished in ulnar distribution on the right. Cerebellar: No ataxia on finger-nose-finger      I have reviewed labs in epic and the results pertinent to this consultation are: CMP-unremarkable  I have reviewed the images obtained: CT head-unremarkable, CTA-multifocal atherosclerotic disease  Impression: 69 year old male with right hand numbness of unclear etiology.  Given the distribution, I am more concerned for an  ulnar neuropathy, or C8 radiculopathy then I am for an acute ischemic stroke, but given his multifocal atherosclerotic disease I still think that this is in the differential.  If his MRI of his brain and cervical spine are negative, then he will need to follow-up as an outpatient for EMG.  Recommendations: 1) MRI brain, MRI cervical spine 2) further recommendations pending the above   Roland Rack, MD [facility] [phone]  If 7pm- 7am, please page neurology on call as listed in [place].

## 2020-09-16 NOTE — Progress Notes (Signed)
  Echocardiogram 2D Echocardiogram has been performed.  Geoffery Lyons [place] 09/16/2020, 10:26 AM

## 2020-09-17 LAB — HEMOGLOBIN A1C
Hgb A1c MFr Bld: 5.3 % (ref 4.8–5.6)
Mean Plasma Glucose: 105 mg/dL

## 2020-09-21 ENCOUNTER — Other Ambulatory Visit: Payer: Self-pay

## 2020-09-21 ENCOUNTER — Encounter: Payer: Self-pay | Admitting: Internal Medicine

## 2020-09-21 ENCOUNTER — Ambulatory Visit: Payer: Medicare HMO | Admitting: Internal Medicine

## 2020-09-21 VITALS — BP 150/72 | HR 67 | Ht 68.5 in | Wt 172.0 lb

## 2020-09-21 DIAGNOSIS — I422 Other hypertrophic cardiomyopathy: Secondary | ICD-10-CM

## 2020-09-21 DIAGNOSIS — I639 Cerebral infarction, unspecified: Secondary | ICD-10-CM

## 2020-09-21 NOTE — ED Provider Notes (Signed)
[facility] Emergency Department Provider Note MRN:  [id number]  Arrival date & time: 09/21/20     Chief Complaint   Arm numbness History of Present Illness   Brandon Gonzales is a 69 y.o. year-old male with a history of CAD presenting to the ED with chief complaint of arm numbness.  Patient endorsing arm numbness and weakness that he noticed upon awakening today.  Went to bed at 10 PM last night with no issues or symptoms.  Symptoms in the right arm are mild, constant.  Denies any pain.  Review of Systems  A complete 10 system review of systems was obtained and all systems are negative except as noted in the HPI and PMH.   Patient's Health History    Past Medical History:  Diagnosis Date  . Arthritis   . Cancer (HCC)    SKIN CA  SQUAMOUS CELL  . Coronary artery disease    a. 2007 Stent to LAD, PTCA D1;  b. DES to LAD & LCx 10/13 in the setting of STEMI.  [name] Kitchen Dyslipidemia    a. Statin and zetia-intolerant. On niacin.  [name] Kitchen GERD (gastroesophageal reflux disease)   . Heart murmur   . Hypertension   . Hypertrophic cardiomyopathy ([place])    a. 09/2012 Echo: EF 55%, mid to apical anterolateral, posterior, apical anterior, apical HK, Gr1 DD, turbulence across LVOT suggesting degree of obstruction, mild MR, mildly dil LA.    Past Surgical History:  Procedure Laterality Date  . CARDIAC CATHETERIZATION  2007   stent to LAD and PTCA of diagonal branch  . CARDIAC CATHETERIZATION  2010  . CORONARY ANGIOPLASTY WITH STENT PLACEMENT  2013   95-99% prox LAD, patent LAD stent distal to this, occluded mid-dist LCx, mild <10% RCA irreg; s/p DES-prox LAD & DES to mid-dist LCx  . CORONARY BALLOON ANGIOPLASTY N/A 05/16/2017   Procedure: Coronary Balloon Angioplasty;  Surgeon: Leonie Man, MD;  Location: [facility];  Service: Cardiovascular;  Laterality: N/A;  . [place]   right  . [address]   left  . LEFT HEART CATH Bilateral 09/22/2012    Procedure: LEFT HEART CATH;  Surgeon: Peter M Martinique, MD;  Location: [facility] CATH LAB;  Service: Cardiovascular;  Laterality: Bilateral;  . LEFT HEART CATH AND CORONARY ANGIOGRAPHY N/A 05/16/2017   Procedure: Left Heart Cath and Coronary Angiography;  Surgeon: Leonie Man, MD;  Location: [place] [facility];  Service: Cardiovascular;  Laterality: N/A;  . PERCUTANEOUS CORONARY STENT INTERVENTION (PCI-S) Right 09/22/2012   Procedure: PERCUTANEOUS CORONARY STENT INTERVENTION (PCI-S);  Surgeon: Peter M Martinique, MD;  Location: [facility] CATH LAB;  Service: Cardiovascular;  Laterality: Right;    Family History  Problem Relation Age of Onset  . Heart attack Mother 11  . Heart disease Mother     Social History   Socioeconomic History  . Marital status: Married    Spouse name: Not on file  . Number of children: Not on file  . Years of education: Not on file  . Highest education level: Not on file  Occupational History  . Not on file  Tobacco Use  . Smoking status: Never Smoker  . Smokeless tobacco: Never Used  Vaping Use  . Vaping Use: Never used  Substance and Sexual Activity  . Alcohol use: No  . Drug use: No  . Sexual activity: Not Currently  Other Topics Concern  . Not on file  Social History Narrative  .  Not on file   Social Determinants of Health   Financial Resource Strain:   . Difficulty of Paying Living Expenses: Not on file  Food Insecurity:   . Worried About Charity fundraiser in the Last Year: Not on file  . Ran Out of Food in the Last Year: Not on file  Transportation Needs:   . Lack of Transportation (Medical): Not on file  . Lack of Transportation (Non-Medical): Not on file  Physical Activity:   . Days of Exercise per Week: Not on file  . Minutes of Exercise per Session: Not on file  Stress:   . Feeling of Stress : Not on file  Social Connections:   . Frequency of Communication with Friends and Family: Not on file  . Frequency of Social Gatherings with Friends and  Family: Not on file  . Attends Religious Services: Not on file  . Active Member of Clubs or Organizations: Not on file  . Attends Archivist Meetings: Not on file  . Marital Status: Not on file  Intimate Partner Violence:   . Fear of Current or Ex-Partner: Not on file  . Emotionally Abused: Not on file  . Physically Abused: Not on file  . Sexually Abused: Not on file     Physical Exam   Vitals:   09/16/20 0738 09/16/20 1201  BP: 130/84 133/88  Pulse: 64 81  Resp: 18 18  Temp: 98 F (36.7 C) 98.3 F (36.8 C)  SpO2: 96% 97%    CONSTITUTIONAL: Well-appearing, NAD NEURO:  Alert and oriented x 3, mild right pronator drift, mild decreased sensation to right arm EYES:  eyes equal and reactive ENT/NECK:  no LAD, no JVD CARDIO: Regular rate, well-perfused, normal S1 and S2 PULM:  CTAB no wheezing or rhonchi GI/GU:  normal bowel sounds, non-distended, non-tender MSK/SPINE:  No gross deformities, no edema SKIN:  no rash, atraumatic PSYCH:  Appropriate speech and behavior  *Additional and/or pertinent findings included in MDM below  Diagnostic and Interventional Summary    EKG Interpretation  Date/Time:  Wednesday September 15 2020 12:04:48 EDT Ventricular Rate:  57 PR Interval:    QRS Duration: 145 QT Interval:  453 QTC Calculation: 442 R Axis:   -59 Text Interpretation: Sinus rhythm RBBB and LAFB Confirmed by Sherwood Gambler [phone]) on 09/16/2020 10:01:06 AM      Labs Reviewed  COMPREHENSIVE METABOLIC PANEL - Abnormal; Notable for the following components:      Result Value   Total Bilirubin 1.9 (*)    All other components within normal limits  URINALYSIS, ROUTINE W REFLEX MICROSCOPIC - Abnormal; Notable for the following components:   Specific Gravity, Urine <1.005 (*)    Hgb urine dipstick TRACE (*)    All other components within normal limits  URINALYSIS, MICROSCOPIC (REFLEX) - Abnormal; Notable for the following components:   Bacteria, UA RARE (*)     All other components within normal limits  LIPID PANEL - Abnormal; Notable for the following components:   Cholesterol 208 (*)    Triglycerides 238 (*)    HDL 27 (*)    VLDL 48 (*)    LDL Cholesterol 133 (*)    All other components within normal limits  RESPIRATORY PANEL BY RT PCR (FLU A&B, COVID)  ETHANOL  CBC  DIFFERENTIAL  RAPID URINE DRUG SCREEN, HOSP PERFORMED  PROTIME-INR  APTT  HEMOGLOBIN A1C  CBG MONITORING, ED    MR CERVICAL SPINE WO CONTRAST  Final Result  MR BRAIN WO CONTRAST  Final Result    DG Chest 2 View  Final Result    CT Angio Head W or Wo Contrast  Final Result    CT Angio Neck W and/or Wo Contrast  Final Result      Medications  iohexol (OMNIPAQUE) 350 MG/ML injection 100 mL (100 mLs Intravenous Contrast Given 09/15/20 1143)   stroke: mapping our early stages of recovery book ( Does not apply Given 09/15/20 2049)  LORazepam (ATIVAN) injection 1 mg (1 mg Intravenous Given 09/16/20 0231)     Procedures  /  Critical Care .Critical Care Performed by: Maudie Flakes, MD Authorized by: Maudie Flakes, MD   Critical care provider statement:    Critical care time (minutes):  32   Critical care was necessary to treat or prevent imminent or life-threatening deterioration of the following conditions: Acute ischemic stroke.   Critical care was time spent personally by me on the following activities:  Discussions with consultants, evaluation of patient's response to treatment, examination of patient, ordering and performing treatments and interventions, ordering and review of laboratory studies, ordering and review of radiographic studies, pulse oximetry, re-evaluation of patient's condition, obtaining history from patient or surrogate and review of old charts    ED Course and Medical Decision Making  I have reviewed the triage vital signs, the nursing notes, and pertinent available records from the EMR.  Listed above are laboratory and imaging tests  that I personally ordered, reviewed, and interpreted and then considered in my medical decision making (see below for details).  Concern for acute ischemic stroke, admitted to hospital service for further care and MRI.       Barth Kirks. Sedonia Small, MD [place] [email or web address]  Final Clinical Impressions(s) / ED Diagnoses     ICD-10-CM   1. Stroke [facility])  I63.9 DG Chest 2 View    DG Chest 2 View    Ambulatory referral to Occupational Therapy  2. Acute ischemic stroke [facility])  I63.9 Ambulatory referral to Occupational Therapy    ED Discharge Orders         Ordered    clopidogrel (PLAVIX) 75 MG tablet  Daily        09/16/20 1423    Ambulatory referral to Occupational Therapy        09/16/20 1421    Ambulatory referral to Occupational Therapy        09/16/20 1423           Discharge Instructions Discussed with and Provided to Patient:       Maudie Flakes, MD 09/21/20 [phone]

## 2020-09-21 NOTE — Patient Instructions (Addendum)
Medication Instructions:  Your physician recommends that you continue on your current medications as directed. Please refer to the Current Medication list given to you today.  Labwork: None ordered.  Testing/Procedures: None ordered.  Follow-Up:  The following dates are available for your procedure:  November 2, 3, 8, 15, 18, 22, 29  Any Other Special Instructions Will Be Listed Below (If Applicable).  If you need a refill on your cardiac medications before your next appointment, please call your pharmacy.   Gave ICD shared decision making documentation

## 2020-09-21 NOTE — Progress Notes (Signed)
HPI Mr. Bringhurst returns today for followup. He is a pleasant 69 yo man with a cryptogenic stroke and now a full recovery, HCM, and sinus node dysfunction. I have seen him back in April and recommended an ICD for primary prevention of sudden death. He wanted to wait. He was seen in the hospital after his stroke to discuss ILR insertion but was felt by Dr. Greggory Brandy to be better off with a recurrent discussion about placement of a DDD ICD for both prevention of sudden death and the ability to detect atrial fib.  Allergies  Allergen Reactions  . Antihistamines, Chlorpheniramine-Type Other (See Comments)    Altered mental status  . Pheniramine Other (See Comments)    Altered mental status  . Statins Other (See Comments)    Leg cramps    . Zetia [Ezetimibe] Other (See Comments)    Leg cramps     Current Outpatient Medications  Medication Sig Dispense Refill  . AMBULATORY NON FORMULARY MEDICATION Take 180 mg by mouth daily. Medication Name: bempedoic acid vs a placebo CLEAR Research Study drug provided    . amLODipine (NORVASC) 2.5 MG tablet TAKE 1 TABLET BY MOUTH EVERY DAY 90 tablet 2  . aspirin EC 81 MG tablet Take 81 mg by mouth daily.    . calcium carbonate (TUMS EX) 750 MG chewable tablet Chew 2 tablets by mouth daily as needed for heartburn.    . carvedilol (COREG) 12.5 MG tablet TAKE 0.5 TABLETS (6.25 MG TOTAL) BY MOUTH 2 (TWO) TIMES DAILY. 90 tablet 3  . clopidogrel (PLAVIX) 75 MG tablet Take 1 tablet (75 mg total) by mouth daily. 30 tablet 0  . esomeprazole (NEXIUM) 20 MG capsule Take 20 mg by mouth every morning.    . Inositol Niacinate (NIACIN FLUSH FREE) 500 MG CAPS Take 1,000 mg by mouth daily.    [name] Kitchen lisinopril (ZESTRIL) 40 MG tablet Take 1 tablet (40 mg total) by mouth daily. 90 tablet 3  . nitroGLYCERIN (NITROSTAT) 0.4 MG SL tablet Place 1 tablet (0.4 mg total) under the tongue every 5 (five) minutes as needed for chest pain. 25 tablet 3  . valACYclovir (VALTREX) 500 MG tablet  Take 1 tablet (500 mg total) by mouth daily as needed (fever blisters). 28 tablet 1   No current facility-administered medications for this visit.     Past Medical History:  Diagnosis Date  . Arthritis   . Cancer (HCC)    SKIN CA  SQUAMOUS CELL  . Coronary artery disease    a. 2007 Stent to LAD, PTCA D1;  b. DES to LAD & LCx 10/13 in the setting of STEMI.  [name] Kitchen Dyslipidemia    a. Statin and zetia-intolerant. On niacin.  [name] Kitchen GERD (gastroesophageal reflux disease)   . Heart murmur   . Hypertension   . Hypertrophic cardiomyopathy ([place])    a. 09/2012 Echo: EF 55%, mid to apical anterolateral, posterior, apical anterior, apical HK, Gr1 DD, turbulence across LVOT suggesting degree of obstruction, mild MR, mildly dil LA.    ROS:   All systems reviewed and negative except as noted in the HPI.   Past Surgical History:  Procedure Laterality Date  . CARDIAC CATHETERIZATION  2007   stent to LAD and PTCA of diagonal branch  . CARDIAC CATHETERIZATION  2010  . CORONARY ANGIOPLASTY WITH STENT PLACEMENT  2013   95-99% prox LAD, patent LAD stent distal to this, occluded mid-dist LCx, mild <10% RCA irreg; s/p DES-prox LAD & DES  to mid-dist LCx  . CORONARY BALLOON ANGIOPLASTY N/A 05/16/2017   Procedure: Coronary Balloon Angioplasty;  Surgeon: Leonie Man, MD;  Location: [place] [facility];  Service: Cardiovascular;  Laterality: N/A;  . [place]   right  . [place]   left  . LEFT HEART CATH Bilateral 09/22/2012   Procedure: LEFT HEART CATH;  Surgeon: Peter M Martinique, MD;  Location: [facility] CATH LAB;  Service: Cardiovascular;  Laterality: Bilateral;  . LEFT HEART CATH AND CORONARY ANGIOGRAPHY N/A 05/16/2017   Procedure: Left Heart Cath and Coronary Angiography;  Surgeon: Leonie Man, MD;  Location: [place] [facility];  Service: Cardiovascular;  Laterality: N/A;  . PERCUTANEOUS CORONARY STENT INTERVENTION (PCI-S) Right 09/22/2012   Procedure: PERCUTANEOUS CORONARY STENT  INTERVENTION (PCI-S);  Surgeon: Peter M Martinique, MD;  [facility] C[facility];  Service: Cardiovascular;  Laterality: Right;     Family History  Problem Relation Age of Onset  . Heart attack Mother 71  . Heart disease Mother      Social History   Socioeconomic History  . Marital status: Married    Spouse name: Not on file  . Number of children: Not on file  . Years of education: Not on file  . Highest education level: Not on file  Occupational History  . Not on file  Tobacco Use  . Smoking status: Never Smoker  . Smokeless tobacco: Never Used  Vaping Use  . Vaping Use: Never used  Substance and Sexual Activity  . Alcohol use: No  . Drug use: No  . Sexual activity: Not Currently  Other Topics Concern  . Not on file  Social History Narrative  . Not on file   Social Determinants of Health   Financial Resource Strain:   . Difficulty of Paying Living Expenses: Not on file  Food Insecurity:   . Worried About Charity fundraiser in the Last Year: Not on file  . Ran Out of Food in the Last Year: Not on file  Transportation Needs:   . Lack of Transportation (Medical): Not on file  . Lack of Transportation (Non-Medical): Not on file  Physical Activity:   . Days of Exercise per Week: Not on file  . Minutes of Exercise per Session: Not on file  Stress:   . Feeling of Stress : Not on file  Social Connections:   . Frequency of Communication with Friends and Family: Not on file  . Frequency of Social Gatherings with Friends and Family: Not on file  . Attends Religious Services: Not on file  . Active Member of Clubs or Organizations: Not on file  . Attends Archivist Meetings: Not on file  . Marital Status: Not on file  Intimate Partner Violence:   . Fear of Current or Ex-Partner: Not on file  . Emotionally Abused: Not on file  . Physically Abused: Not on file  . Sexually Abused: Not on file     BP (!) 150/72   Pulse 67   Ht 5' 8.5" (1.74 m)   Wt 172 lb (78  kg)   SpO2 97%   BMI 25.77 kg/m   Physical Exam:  Well appearing 69 yo man, NAD HEENT: Unremarkable Neck:  No JVD, no thyromegally Lymphatics:  No adenopathy Back:  No CVA tenderness Lungs:  Clear with no wheezes HEART:  Regular rate rhythm, no murmurs, no rubs, no clicks Abd:  soft, positive bowel sounds, no organomegally, no rebound, no  guarding Ext:  2 plus pulses, no edema, no cyanosis, no clubbing Skin:  No rashes no nodules Neuro:  CN II through XII intact, motor grossly intact   Assess/Plan: 1. HCM - he has NSVT and gadolinium uptake by MRI and a h/o near syncope. I have recommended DDD ICD insertion for primary prevention of sudden death. 2. Sinus node dysfunction - he is asymptomatic, despite HR's in the 40's at times. 3. VT - he has non-sustained VT.  4. Stroke - we discussed ILR vs DDD ICD. I recommended the latter.   Carleene Overlie Tomi Paddock,MD

## 2020-09-22 ENCOUNTER — Other Ambulatory Visit: Payer: Self-pay

## 2020-09-22 ENCOUNTER — Ambulatory Visit
Admission: EM | Admit: 2020-09-22 | Discharge: 2020-09-22 | Disposition: A | Discharge status: Home / Self Care | Payer: Medicare HMO | Attending: Physician Assistant | Admitting: Physician Assistant

## 2020-09-22 ENCOUNTER — Encounter: Payer: Self-pay | Admitting: Physician Assistant

## 2020-09-22 DIAGNOSIS — H938X2 Other specified disorders of left ear: Secondary | ICD-10-CM | POA: Diagnosis not present

## 2020-09-22 DIAGNOSIS — B001 Herpesviral vesicular dermatitis: Secondary | ICD-10-CM

## 2020-09-22 DIAGNOSIS — H9202 Otalgia, left ear: Secondary | ICD-10-CM

## 2020-09-22 MED ORDER — CIPROFLOXACIN HCL 500 MG PO TABS: 500.0000 mg | ORAL_TABLET | Freq: Two times a day (BID) | ORAL | 0 refills | Status: DC

## 2020-09-22 MED ORDER — VALACYCLOVIR HCL 500 MG PO TABS: 500.0000 mg | ORAL_TABLET | Freq: Every day | ORAL | 0 refills | Status: DC | PRN

## 2020-09-22 NOTE — Discharge Instructions (Signed)
Start ciprofloxacin for possible infection to the left ear. Warm compress. If having worsening, pain, swelling, redness, fever, go to the ED for further evaluation.   Valtrex refilled for 90 days

## 2020-09-22 NOTE — ED Triage Notes (Signed)
Pt states he pulled several hairs out of his inner ear yesterday and his face began swelling after he got into the hot tub and continued thru the night and today. Pt is aox4 and ambulatory.

## 2020-09-22 NOTE — ED Provider Notes (Signed)
[facility]    CSN: [id number] Arrival date & time: 09/22/20  1708      History   Chief Complaint Chief Complaint  Patient presents with  . Otalgia    left ear since yesterday  . Facial Pain    left side since yesterday    HPI Brandon Gonzales is a 69 y.o. male.   69 year old male comes in for left ear/facial pain and swelling x 2 days. He had pulled several hairs out of his left ear, which he does on normal basis. Went into the hot tub afterwards, unsure if water got in the ear. However, since then has had swelling and pain to the ear extending to the face. No obvious erythema, warmth. Denies fever. Denies changes in swelling with salivation.      Past Medical History:  Diagnosis Date  . Arthritis   . Cancer (HCC)    SKIN CA  SQUAMOUS CELL  . Coronary artery disease    a. 2007 Stent to LAD, PTCA D1;  b. DES to LAD & LCx 10/13 in the setting of STEMI.  [name] Kitchen Dyslipidemia    a. Statin and zetia-intolerant. On niacin.  [name] Kitchen GERD (gastroesophageal reflux disease)   . Heart murmur   . Hypertension   . Hypertrophic cardiomyopathy ([place])    a. 09/2012 Echo: EF 55%, mid to apical anterolateral, posterior, apical anterior, apical HK, Gr1 DD, turbulence across LVOT suggesting degree of obstruction, mild MR, mildly dil LA.    Patient Active Problem List   Diagnosis Date Noted  . Acute ischemic stroke ([place]) 09/15/2020  . Near syncope 12/17/2019  . Statin myopathy 11/13/2019  . Herpes labialis 01/31/2018  . Healthcare maintenance 01/31/2018  . Essential hypertension   . Heart murmur   . GERD (gastroesophageal reflux disease)   . CAD S/P percutaneous coronary angioplasty   . Cancer ([place])   . Angina at rest [facility]) 05/17/2017  . Hypertrophic cardiomyopathy ([place])   . S/P PTCA (percutaneous transluminal coronary angioplasty)   . Chest pain 05/16/2017  . Unstable angina ([place])   . Dyspnea on exertion 02/21/2017  . Neck pain 12/28/2016  . NSVT (nonsustained ventricular  tachycardia) ([place]) 09/25/2012  . Hypertrophic obstructive cardiomyopathy([address]) 09/25/2012  . CKD (chronic kidney disease) stage 3, GFR 30-59 ml/min (HCC) 09/25/2012  . ST elevation myocardial infarction (STEMI) of lateral wall ([place]) 09/22/2012  . CAD (coronary artery disease) 01/11/2012  . Dyslipidemia   . H/O gastroesophageal reflux (GERD)   . Arthritis     Past Surgical History:  Procedure Laterality Date  . CARDIAC CATHETERIZATION  2007   stent to LAD and PTCA of diagonal branch  . CARDIAC CATHETERIZATION  2010  . CORONARY ANGIOPLASTY WITH STENT PLACEMENT  2013   95-99% prox LAD, patent LAD stent distal to this, occluded mid-dist LCx, mild <10% RCA irreg; s/p DES-prox LAD & DES to mid-dist LCx  . CORONARY BALLOON ANGIOPLASTY N/A 05/16/2017   Procedure: Coronary Balloon Angioplasty;  Surgeon: Leonie Man, MD;  Location: [place] [facility];  Service: Cardiovascular;  Laterality: N/A;  . [place]   right  . [place]   left  . LEFT HEART CATH Bilateral 09/22/2012   Procedure: LEFT HEART CATH;  Surgeon: Peter M Martinique, MD;  Location[facility] C[facility] LAB;  Service: Cardiovascular;  Laterality: Bilateral;  . LEFT HEART CATH AND CORONARY ANGIOGRAPHY N/A 05/16/2017   Procedure: Left Heart Cath and Coronary Angiography;  Surgeon: Glenetta Hew  W, MD;  Location: [place] [facility];  Service: Cardiovascular;  Laterality: N/A;  . PERCUTANEOUS CORONARY STENT INTERVENTION (PCI-S) Right 09/22/2012   Procedure: PERCUTANEOUS CORONARY STENT INTERVENTION (PCI-S);  Surgeon: Peter M Martinique, MD;  Location: [facility] CATH LAB;  Service: Cardiovascular;  Laterality: Right;       Home Medications    Prior to Admission medications   Medication Sig Start Date End Date Taking? Authorizing Provider  AMBULATORY NON FORMULARY MEDICATION Take 180 mg by mouth daily. Medication Name: bempedoic acid vs a placebo CLEAR Research Study drug provided 05/02/17  Yes Lelon Perla, MD  amLODipine  (NORVASC) 2.5 MG tablet TAKE 1 TABLET BY MOUTH EVERY DAY 03/15/20  Yes Martinique, Peter M, MD  aspirin EC 81 MG tablet Take 81 mg by mouth daily.   Yes [provider]  calcium carbonate (TUMS EX) 750 MG chewable tablet Chew 2 tablets by mouth daily as needed for heartburn.   Yes [provider]  carvedilol (COREG) 12.5 MG tablet TAKE 0.5 TABLETS (6.25 MG TOTAL) BY MOUTH 2 (TWO) TIMES DAILY. 12/24/19  Yes Martinique, Peter M, MD  clopidogrel (PLAVIX) 75 MG tablet Take 1 tablet (75 mg total) by mouth daily. 09/17/20 10/17/20 Yes Pahwani, Einar Grad, MD  esomeprazole (NEXIUM) 20 MG capsule Take 20 mg by mouth every morning.   Yes [provider]  Inositol Niacinate (NIACIN FLUSH FREE) 500 MG CAPS Take 1,000 mg by mouth daily.   Yes [provider]  lisinopril (ZESTRIL) 40 MG tablet Take 1 tablet (40 mg total) by mouth daily. 02/03/20  Yes Martinique, Peter M, MD  ciprofloxacin (CIPRO) 500 MG tablet Take 1 tablet (500 mg total) by mouth every 12 (twelve) hours. 09/22/20   Tasia Catchings, Lourine Alberico V, PA-C  nitroGLYCERIN (NITROSTAT) 0.4 MG SL tablet Place 1 tablet (0.4 mg total) under the tongue every 5 (five) minutes as needed for chest pain. 09/13/20 12/12/20  Martinique, Peter M, MD  valACYclovir (VALTREX) 500 MG tablet Take 1 tablet (500 mg total) by mouth daily as needed (fever blisters). 09/22/20   Ok Edwards, PA-C    Family History Family History  Problem Relation Age of Onset  . Heart attack Mother 72  . Heart disease Mother     Social History Social History   Tobacco Use  . Smoking status: Never Smoker  . Smokeless tobacco: Never Used  Vaping Use  . Vaping Use: Never used  Substance Use Topics  . Alcohol use: No  . Drug use: No     Allergies   Antihistamines, chlorpheniramine-type; Pheniramine; Statins; and Zetia [ezetimibe]   Review of Systems Review of Systems  Reason unable to perform ROS: See HPI as above.     Physical Exam Triage Vital Signs ED Triage Vitals  Enc Vitals  Group     BP 09/22/20 1828 (!) 170/100     Pulse Rate 09/22/20 1828 67     Resp 09/22/20 1828 18     Temp 09/22/20 1828 97.9 F (36.6 C)     Temp Source 09/22/20 1828 Oral     SpO2 09/22/20 1828 96 %     Weight --      Height --      Head Circumference --      Peak Flow --      Pain Score 09/22/20 1858 2     Pain Loc --      Pain Edu? --      Excl. in [place]? --  No data found.  Updated Vital Signs BP (!) 170/100 (BP Location: Left Arm)   Pulse 67   Temp 97.9 F (36.6 C) (Oral)   Resp 18   SpO2 96%   Physical Exam Constitutional:      General: He is not in acute distress.    Appearance: Normal appearance. He is well-developed. He is not toxic-appearing or diaphoretic.  HENT:     Head: Normocephalic and atraumatic.     Ears:     Comments: Right ear: no tenderness to tragus. Ear canal normal. TM normal  Left Ear: swelling to the tragus and preauricular area. No obvious erythema, though slightly warm to touch. Tenderness to palpation of the area. No obvious lymph node felt within the swelling. Ear canal normal without swelling/erythema. TM normal.  Eyes:     Conjunctiva/sclera: Conjunctivae normal.     Pupils: Pupils are equal, round, and reactive to light.  Pulmonary:     Effort: Pulmonary effort is normal. No respiratory distress.  Musculoskeletal:     Cervical back: Normal range of motion and neck supple.  Skin:    General: Skin is warm and dry.  Neurological:     Mental Status: He is alert and oriented to person, place, and time.      UC Treatments / Results  Labs (all labs ordered are listed, but only abnormal results are displayed) Labs Reviewed - No data to display  EKG   Radiology No results found.  Procedures Procedures (including critical care time)  Medications Ordered in UC Medications - No data to display  Initial Impression / Assessment and Plan / UC Course  I have reviewed the triage vital signs and the nursing notes.  Pertinent labs &  imaging results that were available during my care of the patient were reviewed by me and considered in my medical decision making (see chart for details).    Unsure etiology of swelling/pain. ?lymphadenopathy vs cellulitis vs sialolithiasis. However, given significant pain, using hot tub, will cover for psuedomonas with cipro. Warm compresses. Return precautions given.  Patient in between PCPs, takes daily valtrex, requesting refill.   Final Clinical Impressions(s) / UC Diagnoses   Final diagnoses:  Ear swelling, left  Left ear pain   ED Prescriptions    Medication Sig Dispense Auth. Provider   ciprofloxacin (CIPRO) 500 MG tablet Take 1 tablet (500 mg total) by mouth every 12 (twelve) hours. 14 tablet Shalese Strahan V, PA-C   valACYclovir (VALTREX) 500 MG tablet Take 1 tablet (500 mg total) by mouth daily as needed (fever blisters). 90 tablet Ok Edwards, PA-C     PDMP not reviewed this encounter.   Ok Edwards, PA-C 09/23/20 [phone]

## 2020-09-24 ENCOUNTER — Encounter: Payer: Self-pay | Admitting: *Deleted

## 2020-09-24 ENCOUNTER — Other Ambulatory Visit: Payer: Self-pay | Admitting: *Deleted

## 2020-09-24 NOTE — Patient Outreach (Addendum)
[place] [facility]) Care Management  09/24/2020  Brandon Gonzales 05/15/1951 [id number]   [place] for EMMI-stroke  RED ON EMMI ALERT Day #   6        Date: Thursday  09/23/20 1000 Red Alert Reason: Smoked or been around smoke? Yes   Insurance: [name]  [facility] admissions x  1 ED visits x 1 in the last 6 months  Last admission 09/15/20 -09/16/20  Outreach attempt # 1 successful  Patient is able to verify HIPAA identifiers [facility] Care Management RN reviewed and addressed red alert with patient  Consent: [facility] RN CM reviewed [facility] services with patient. Patient gave verbal consent for services [facility] telephonic RN CM.    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 He voiced understanding   Advised patient that there will be further automated EMMI- post discharge calls to assess how the patient is doing following the recent hospitalization Advised the patient that another call may be received from a nurse if any of their responses were abnormal. Patient voiced understanding and was appreciative of f/u call.  Brandon Gonzales agreed to follow up in 14-21 business days   [facility] RN CM left a message at neuro rehab at [phone] (to inquire about possible reason in delay of start of services)  Tamika, staff  from neuro outpatient returned a call to [place] RN CM reviewed pt inquiry about  the 11/01/20 start date for OT. Tamika agreed to have the Neuro outpatient scheduler to outreach to Brandon Gonzales upon return to the office to review this.   [facility] RN CM sent pt EMMI education on defib (implantable cardioverter defibrillator), anticoagulants for atrial fibrillation, stroke-preventing secondary stroke, and carotid artery disease to the listed e mail address in Epic   DME glasses  Plan: [facility] RN CM will follow up with Brandon Gonzales within the next 14-21 business days  Pt encouraged to return a call to [facility] RN CM prn Routed note to MDs Goals Addressed              This Visit's Progress     Patient Stated   .  [facility]) Keep or Improve My Strength (pt-stated)        Follow Up Date 10/08/20   - attend 90 percent of occupational therapy appointments - increase activity or exercise time a little every week    Why is this important?   Before the stroke you probably did not think much about being safe when you are up and about.  Now, it may be harder for you to get around.  It may also be easier for you to trip or fall.  It is common to have muscle weakness after a stroke. You may also feel like you cannot control an arm or leg.  It will be helpful to work with a physical therapist to get your  strength and muscle control back.  It is good to stay as active as you can. Walking and stretching help you stay strong and flexible.  The physical therapist will develop an exercise program just for you.     Notes:          Saydie Gerdts [name], RN, BSN, [place] Coordinator Office number [phone] Mobile number [phone]  Main [facility] number [phone] Fax number [phone]

## 2020-09-28 ENCOUNTER — Other Ambulatory Visit: Payer: Self-pay

## 2020-09-28 ENCOUNTER — Encounter: Payer: Self-pay | Admitting: Occupational Therapy

## 2020-09-28 ENCOUNTER — Ambulatory Visit: Payer: Medicare HMO | Attending: Family Medicine | Admitting: Occupational Therapy

## 2020-09-28 DIAGNOSIS — I69351 Hemiplegia and hemiparesis following cerebral infarction affecting right dominant side: Secondary | ICD-10-CM | POA: Diagnosis present

## 2020-09-28 DIAGNOSIS — R278 Other lack of coordination: Secondary | ICD-10-CM | POA: Diagnosis not present

## 2020-09-28 DIAGNOSIS — M6281 Muscle weakness (generalized): Secondary | ICD-10-CM | POA: Diagnosis present

## 2020-09-28 NOTE — Therapy (Signed)
[place] [address] [place], [place], [address] Phone: [phone]   Fax:  [phone]  Occupational Therapy Evaluation  Patient Details  Name: Brandon Gonzales MRN: [id number] Date of Birth: 1950-12-10 No data recorded  Encounter Date: 09/28/2020   OT End of Session - 09/28/20 1517    Visit Number 1    Number of Visits 5    Authorization Type Aetna MCR- VL:MN - $35.00 Copay    OT Start Time 1315    OT Stop Time 1400    OT Time Calculation (min) 45 min    Activity Tolerance Patient tolerated treatment well    Behavior During Therapy [facility] for tasks assessed/performed           Past Medical History:  Diagnosis Date  . Arthritis   . Cancer (HCC)    SKIN CA  SQUAMOUS CELL  . Coronary artery disease    a. 2007 Stent to LAD, PTCA D1;  b. DES to LAD & LCx 10/13 in the setting of STEMI.  [name] Kitchen Dyslipidemia    a. Statin and zetia-intolerant. On niacin.  [name] Kitchen GERD (gastroesophageal reflux disease)   . Heart murmur   . Hypertension   . Hypertrophic cardiomyopathy ([place])    a. 09/2012 Echo: EF 55%, mid to apical anterolateral, posterior, apical anterior, apical HK, Gr1 DD, turbulence across LVOT suggesting degree of obstruction, mild MR, mildly dil LA.    Past Surgical History:  Procedure Laterality Date  . CARDIAC CATHETERIZATION  2007   stent to LAD and PTCA of diagonal branch  . CARDIAC CATHETERIZATION  2010  . CORONARY ANGIOPLASTY WITH STENT PLACEMENT  2013   95-99% prox LAD, patent LAD stent distal to this, occluded mid-dist LCx, mild <10% RCA irreg; s/p DES-prox LAD & DES to mid-dist LCx  . CORONARY BALLOON ANGIOPLASTY N/A 05/16/2017   Procedure: Coronary Balloon Angioplasty;  Surgeon: Leonie Man, MD;  Location: [place] CV LAB;  Service: Cardiovascular;  Laterality: N/A;  . [place]   right  . [place]   left  . LEFT HEART CATH Bilateral 09/22/2012   Procedure: LEFT HEART CATH;   Surgeon: Peter M Martinique, MD;  Location: [facility] CATH LAB;  Service: Cardiovascular;  Laterality: Bilateral;  . LEFT HEART CATH AND CORONARY ANGIOGRAPHY N/A 05/16/2017   Procedure: Left Heart Cath and Coronary Angiography;  Surgeon: Leonie Man, MD;  Location: [place] CV LAB;  Service: Cardiovascular;  Laterality: N/A;  . PERCUTANEOUS CORONARY STENT INTERVENTION (PCI-S) Right 09/22/2012   Procedure: PERCUTANEOUS CORONARY STENT INTERVENTION (PCI-S);  Surgeon: Peter M Martinique, MD;  Location: [facility] CATH LAB;  Service: Cardiovascular;  Laterality: Right;    There were no vitals filed for this visit.   Subjective Assessment - 09/28/20 1321    Subjective  When I woke up on the 13th my fingers were drawn up and I could not move them    Pertinent History H/O MI, Stents, HTN, HLD with statin intolerance    Currently in Pain? No/denies    Pain Score 0-No pain             [facility] OT Assessment - 09/28/20 0001      Assessment   Medical Diagnosis Stroke    Onset Date/Surgical Date 09/15/20    Hand Dominance Right    Prior Therapy NA      Precautions   Precautions None      Restrictions   Weight Bearing Restrictions  No      Prior Function   Level of Independence Independent with basic ADLs    Vocation Self employed;Retired    Associate Professor a Chiropodist to [employer]    Leisure golf, work around [employer] and yard, 22      ADL   Eating/Feeding Min guard    Grooming Independent    Comptroller Increased time    Patent examiner - Social research officer, government -  Product/process development scientist Independent      IADL   Prior Level of [place] care of all shopping needs independently    Prior Level of Function Light Housekeeping Independent     Light Housekeeping Performs light daily tasks such as dishwashing, bed making    Prior Level of Function Meal Prep Does not cook    Prior Level of [place] own vehicle    Prior Level of Function Medication Managment independent    Medication Management Is responsible for taking medication in correct dosages at correct time    Prior Level of Function Therapist, sports financial matters independently (budgets, writes checks, pays rent, bills goes to bank), collects and keeps track of income      Written Expression   Dominant Hand Right    Handwriting 100% legible      Vision - History   Baseline Vision Wears glasses only for reading    Additional Comments Healthy eyes and no changes with recent stroke      Vision Assessment   Eye Alignment Within Functional Limits    Ocular Range of Motion Within Functional Limits    Alignment/Gaze Preference Within Defined Limits    Tracking/Visual Pursuits Able to track stimulus in all quads without difficulty      Cognition   Overall Cognitive Status Impaired/Different from baseline    Memory Impaired    Cognition Comments Reports decreased recall of information - facila recognition of old colleagues.        Posture/Postural Control   Posture/Postural Control No significant limitations      Sensation   Light Touch Impaired by gross assessment    Stereognosis Impaired by gross assessment   difficulty with in hand manip   Hot/Cold Appears Intact    Proprioception Not tested      Coordination   Gross Motor Movements are Fluid and Coordinated Yes    Fine Motor Movements are Fluid and Coordinated No    Finger Nose Finger Test Mild overshooting right    9 Hole Peg Test Right;Left    Right 9 Hole Peg Test 32.72    Left 9 Hole Peg Test 30.34    Coordination Decreased use of 4th qnd 5th digits right      Perception   Perception Within  Functional Limits      Praxis   Praxis Intact      ROM / Strength   AROM / PROM / Strength AROM;Strength      AROM   Overall AROM  Within functional limits for tasks performed      Strength   Overall Strength Deficits    Overall Strength Comments 4/4+ throughout RUE, 5/5  IN lue      Hand Function   Right Hand Gross Grasp Impaired    Right Hand Grip (lbs) 54.6    Right Hand Lateral Pinch 15 lbs    Right Hand 3 Point Pinch 9 lbs    Left Hand Gross Grasp Functional    Left Hand Grip (lbs) 83.5    Left Hand Lateral Pinch 22 lbs    Left 3 point pinch 15 lbs                           OT Education - 09/28/20 1516    Education Details Results of OT eval and plan of care    Person(s) Educated Patient    Methods Explanation    Comprehension Verbalized understanding               OT Long Term Goals - 09/28/20 1525      OT LONG TERM GOAL #1   Title Patient will independnetly complete an HEP designed to improve coordiantion in right hand    Time 4    Period --   visits   Target Date 11/27/20      OT LONG TERM GOAL #2   Title Patient will independnetly complete an HEP designed to improve strength in right hand    Time 4    Period --   visits   Status New      OT LONG TERM GOAL #3   Title Patient will demonstrate 8 lb increase in right hand grip strength    Time 4    Period --   visits   Status New      OT LONG TERM GOAL #4   Title Patient will demonstrate improved in hand manipulation as evidenced by his ability to move coins from ulnar to radail side of hand when counting change    Time 4    Period --   visits                Plan - 09/28/20 1519    Clinical Impression Statement 70 y.o. male with medical history significant of CAD s/p PCI and stents.Multiple left MCA infarcts embolic secondary to unknown source.CTA head & neck advanced atherosclerosis with severe statin intolerance,  Patient presents to OT evaluation with mild right  hemiparesis, decreased strength and coordiantion in dominant right side, and mild report of memory changes.    OT Occupational Profile and History Problem Focused Assessment - Including review of records relating to presenting problem    Occupational performance deficits (Please refer to evaluation for details): IADL's    Body Structure / Function / Physical Skills ADL;Body mechanics;Dexterity;FMC;Strength;ROM;Flexibility;IADL;Vestibular;Coordination;Endurance;UE functional use    Rehab Potential Excellent    Clinical Decision Making Limited treatment options, no task modification necessary    Comorbidities Affecting Occupational Performance: May have comorbidities impacting occupational performance    Modification or Assistance to Complete Evaluation  No modification of tasks or assist necessary to complete eval    OT Frequency 1x / week    OT Duration 4 weeks    OT Treatment/Interventions Self-care/ADL training;Therapeutic exercise;Patient/family education;Splinting;Neuromuscular education;Moist Heat;Fluidtherapy;Therapist, nutritional;Therapeutic activities;Cognitive remediation/compensation;Manual Therapy;DME and/or AE instruction    Plan Initiate HEP coordiantion - especially in hand manipulation, hand strengthening    Consulted and Agree with Plan of Care Patient           Patient will benefit from skilled therapeutic intervention in order to improve the following deficits  and impairments:   Body Structure / Function / Physical Skills: ADL, Body mechanics, Dexterity, FMC, Strength, ROM, Flexibility, IADL, Vestibular, Coordination, Endurance, UE functional use       Visit Diagnosis: Other lack of coordination - Plan: Ot plan of care cert/re-cert  Muscle weakness (generalized) - Plan: Ot plan of care cert/re-cert  Hemiplegia and hemiparesis following cerebral infarction affecting right dominant side ([place]) - Plan: Ot plan of care cert/re-cert    Problem List Patient Active  Problem List   Diagnosis Date Noted  . Acute ischemic stroke ([place]) 09/15/2020  . Near syncope 12/17/2019  . Statin myopathy 11/13/2019  . Herpes labialis 01/31/2018  . Healthcare maintenance 01/31/2018  . Essential hypertension   . Heart murmur   . GERD (gastroesophageal reflux disease)   . CAD S/P percutaneous coronary angioplasty   . Cancer ([place])   . Angina at rest [facility]) 05/17/2017  . Hypertrophic cardiomyopathy ([place])   . S/P PTCA (percutaneous transluminal coronary angioplasty)   . Chest pain 05/16/2017  . Unstable angina ([place])   . Dyspnea on exertion 02/21/2017  . Neck pain 12/28/2016  . Positional vertigo 04/24/2016  . NSVT (nonsustained ventricular tachycardia) ([place]) 09/25/2012  . Hypertrophic obstructive cardiomyopathy(425.11) 09/25/2012  . CKD (chronic kidney disease) stage 3, GFR 30-59 ml/min (HCC) 09/25/2012  . ST elevation myocardial infarction (STEMI) of lateral wall ([place]) 09/22/2012  . CAD (coronary artery disease) 01/11/2012  . Dyslipidemia   . H/O gastroesophageal reflux (GERD)   . Arthritis     Mariah Milling , OTR/L 09/28/2020, 3:30 PM  [place] [address] [place], [place], [address] Phone: [phone]   Fax:  [phone]  Name: Brandon Gonzales MRN: [id number] Date of Birth: 15-Sep-1951

## 2020-10-01 ENCOUNTER — Other Ambulatory Visit: Payer: Self-pay | Admitting: Cardiology

## 2020-10-01 ENCOUNTER — Other Ambulatory Visit: Payer: Self-pay | Admitting: *Deleted

## 2020-10-01 NOTE — Patient Outreach (Signed)
[facility]) Care Management  10/01/2020  Brandon Gonzales [name] 19, 1951 [id number]   [facility] Unsuccessful outreach for EMMI-stroke  RED ON EMMI ALERT Day #   6        Date: Thursday  09/23/20 1000 Red Alert Reason: Smoked or been around smoke? Yes   Insurance: [name]  [facility] admissions x  1 ED visits x 1 in the last 6 months  Last admission 09/15/20 -09/16/20   Outreach attempt to the home number  No answer. [facility] RN CM left HIPAA [facility] Portability and Accountability Act) compliant voicemail message along with CM's contact info.   Plan: [facility] RN CM scheduled this patient for another call attempt within 4-7 business days  [name] [name], RN, BSN, [place] Coordinator Office number [phone] Mobile number [phone]  Main [facility] number [phone] Fax number [phone]

## 2020-10-05 ENCOUNTER — Other Ambulatory Visit: Payer: Self-pay

## 2020-10-05 ENCOUNTER — Encounter: Payer: Self-pay | Admitting: Occupational Therapy

## 2020-10-05 ENCOUNTER — Ambulatory Visit: Payer: Medicare HMO | Attending: Family Medicine | Admitting: Occupational Therapy

## 2020-10-05 DIAGNOSIS — I69351 Hemiplegia and hemiparesis following cerebral infarction affecting right dominant side: Secondary | ICD-10-CM | POA: Insufficient documentation

## 2020-10-05 DIAGNOSIS — M6281 Muscle weakness (generalized): Secondary | ICD-10-CM

## 2020-10-05 DIAGNOSIS — R278 Other lack of coordination: Secondary | ICD-10-CM

## 2020-10-05 NOTE — Patient Instructions (Signed)
  Coordination Activities  Perform the following activities for 10 minutes 1-2 times per day with right hand(s).   Flip cards 1 at a time as fast as you can.  Deal cards with your thumb (Hold deck in hand and push card off top with thumb).  Rotate card in hand (clockwise and counter-clockwise).  Shuffle cards.  Pick up coins and stack.  Pick up coins one at a time until you get 5-10 in your hand, then move coins from palm to fingertips to stack one at a time.  Practice writing and/or typing.  roll two golf balls in hand clockwise and counterclockwise

## 2020-10-05 NOTE — Therapy (Signed)
[place] [address] [place], [place], [address] Phone: [phone]   Fax:  [phone]  Occupational Therapy Treatment  Patient Details  Name: Brandon Gonzales MRN: [id number] Date of Birth: March 02, 1951 No data recorded  Encounter Date: 10/05/2020   OT End of Session - 10/05/20 1231    Visit Number 2    Number of Visits 5    Authorization Type Aetna MCR- VL:MN - $35.00 Copay    OT Start Time 1145    OT Stop Time 1225    OT Time Calculation (min) 40 min    Activity Tolerance Patient tolerated treatment well    Behavior During Therapy [facility] for tasks assessed/performed           Past Medical History:  Diagnosis Date  . Arthritis   . Cancer (HCC)    SKIN CA  SQUAMOUS CELL  . Coronary artery disease    a. 2007 Stent to LAD, PTCA D1;  b. DES to LAD & LCx 10/13 in the setting of STEMI.  [name] Kitchen Dyslipidemia    a. Statin and zetia-intolerant. On niacin.  [name] Kitchen GERD (gastroesophageal reflux disease)   . Heart murmur   . Hypertension   . Hypertrophic cardiomyopathy ([place])    a. 09/2012 Echo: EF 55%, mid to apical anterolateral, posterior, apical anterior, apical HK, Gr1 DD, turbulence across LVOT suggesting degree of obstruction, mild MR, mildly dil LA.    Past Surgical History:  Procedure Laterality Date  . CARDIAC CATHETERIZATION  2007   stent to LAD and PTCA of diagonal branch  . CARDIAC CATHETERIZATION  2010  . CORONARY ANGIOPLASTY WITH STENT PLACEMENT  2013   95-99% prox LAD, patent LAD stent distal to this, occluded mid-dist LCx, mild <10% RCA irreg; s/p DES-prox LAD & DES to mid-dist LCx  . CORONARY BALLOON ANGIOPLASTY N/A 05/16/2017   Procedure: Coronary Balloon Angioplasty;  Surgeon: Leonie Man, MD;  Location: [place] CV LAB;  Service: Cardiovascular;  Laterality: N/A;  . [place]   right  . [place]   left  . LEFT HEART CATH Bilateral 09/22/2012   Procedure: LEFT HEART CATH;   Surgeon: Peter M Martinique, MD;  Location[facility] CATH LAB;  Service: Cardiovascular;  Laterality: Bilateral;  . LEFT HEART CATH AND CORONARY ANGIOGRAPHY N/A 05/16/2017   Procedure: Left Heart Cath and Coronary Angiography;  Surgeon: Leonie Man, MD;  Location: [place] CV LAB;  Service: Cardiovascular;  Laterality: N/A;  . PERCUTANEOUS CORONARY STENT INTERVENTION (PCI-S) Right 09/22/2012   Procedure: PERCUTANEOUS CORONARY STENT INTERVENTION (PCI-S);  Surgeon: Peter M Martinique, MD;  Location: [facility] CATH LAB;  Service: Cardiovascular;  Laterality: Right;    There were no vitals filed for this visit.   Subjective Assessment - 10/05/20 1151    Subjective  Patient indicates hsi hand is getting better - working with a ball daily    Pertinent History H/O MI, Stents, HTN, HLD with statin intolerance    Currently in Pain? No/denies    Pain Score 0-No pain              [facility] OT Assessment - 10/05/20 0001      Hand Function   Right Hand Grip (lbs) 72.9                    OT Treatments/Exercises (OP) - 10/05/20 0001      Neurological Re-education Exercises   Other Exercises 1 Established HEP for  coordination Right dominant hand.  Patient with significant improvement since evaluation - has already met some LTG's.  Discussed early discharge - patient requesting one more visit to ensure he is on track.  Anticipate discharge next visit.      Other Exercises 2 Green theraputty exercises to address strength - specifically in 4th and 5th digits.                    OT Education - 10/05/20 1231    Education Details FM Coordination, therapy putty    Person(s) Educated Patient    Methods Explanation;Demonstration;Tactile cues;Verbal cues;Handout    Comprehension Verbalized understanding;Returned demonstration               OT Long Term Goals - 10/05/20 1233      OT LONG TERM GOAL #1   Title Patient will independnetly complete an HEP designed to improve coordiantion in right  hand    Time 4    Period --   visits   Status On-going      OT LONG TERM GOAL #2   Title Patient will independnetly complete an HEP designed to improve strength in right hand    Time 4    Period --   visits   Status On-going      OT LONG TERM GOAL #3   Title Patient will demonstrate 8 lb increase in right hand grip strength    Time 4    Period --   visits   Status Achieved      OT LONG TERM GOAL #4   Title Patient will demonstrate improved in hand manipulation as evidenced by his ability to move coins from ulnar to radail side of hand when counting change    Time 4    Period --   visits   Status Achieved                 Plan - 10/05/20 1231    Clinical Impression Statement Patient has been dilligent about use of right hand for daily tasks.  Patient progressing rapidly!    OT Occupational Profile and History Problem Focused Assessment - Including review of records relating to presenting problem    Occupational performance deficits (Please refer to evaluation for details): IADL's    Body Structure / Function / Physical Skills ADL;Body mechanics;Dexterity;FMC;Strength;ROM;Flexibility;IADL;Vestibular;Coordination;Endurance;UE functional use    Rehab Potential Excellent    Clinical Decision Making Limited treatment options, no task modification necessary    Comorbidities Affecting Occupational Performance: May have comorbidities impacting occupational performance    Modification or Assistance to Complete Evaluation  No modification of tasks or assist necessary to complete eval    OT Frequency 1x / week    OT Duration 4 weeks    OT Treatment/Interventions Self-care/ADL training;Therapeutic exercise;Patient/family education;Splinting;Neuromuscular education;Moist Heat;Fluidtherapy;Therapist, nutritional;Therapeutic activities;Cognitive remediation/compensation;Manual Therapy;DME and/or AE instruction    Plan Check HEP coordination, putty exercises - roll - pinch, lateral  pinch, check remaining goals, anticipate discharge    OT Home Exercise Plan Coordiantion putty    Consulted and Agree with Plan of Care Patient           Patient will benefit from skilled therapeutic intervention in order to improve the following deficits and impairments:   Body Structure / Function / Physical Skills: ADL, Body mechanics, Dexterity, FMC, Strength, ROM, Flexibility, IADL, Vestibular, Coordination, Endurance, UE functional use       Visit Diagnosis: Other lack of coordination  Muscle weakness (generalized)  Hemiplegia  and hemiparesis following cerebral infarction affecting right dominant side [facility])    Problem List Patient Active Problem List   Diagnosis Date Noted  . Acute ischemic stroke ([place]) 09/15/2020  . Near syncope 12/17/2019  . Statin myopathy 11/13/2019  . Herpes labialis 01/31/2018  . Healthcare maintenance 01/31/2018  . Essential hypertension   . Heart murmur   . GERD (gastroesophageal reflux disease)   . CAD S/P percutaneous coronary angioplasty   . Cancer ([place])   . Angina at rest [facility]) 05/17/2017  . Hypertrophic cardiomyopathy ([place])   . S/P PTCA (percutaneous transluminal coronary angioplasty)   . Chest pain 05/16/2017  . Unstable angina ([place])   . Dyspnea on exertion 02/21/2017  . Neck pain 12/28/2016  . Positional vertigo 04/24/2016  . NSVT (nonsustained ventricular tachycardia) ([place]) 09/25/2012  . Hypertrophic obstructive cardiomyopathy(425.11) 09/25/2012  . CKD (chronic kidney disease) stage 3, GFR 30-59 ml/min (HCC) 09/25/2012  . ST elevation myocardial infarction (STEMI) of lateral wall ([place]) 09/22/2012  . CAD (coronary artery disease) 01/11/2012  . Dyslipidemia   . H/O gastroesophageal reflux (GERD)   . Arthritis     Mariah Milling, OTR/L 10/05/2020, 12:34 PM  [place] [address] [place], [place], [address] Phone: [phone]   Fax:  [phone]  Name:  Brandon Gonzales MRN: [id number] Date of Birth: 10/13/1951

## 2020-10-12 ENCOUNTER — Other Ambulatory Visit: Payer: Self-pay | Admitting: *Deleted

## 2020-10-12 ENCOUNTER — Other Ambulatory Visit: Payer: Self-pay

## 2020-10-12 ENCOUNTER — Ambulatory Visit: Payer: Medicare HMO | Admitting: Internal Medicine

## 2020-10-12 NOTE — Patient Outreach (Signed)
[facility]) Care Management  10/12/2020  Brandon Gonzales 1951/05/03 [id number]   [place] for EMMI-stroke and case closure   Brandon Gonzales was referred to [facility] on 09/24/20 for [facility] EMMI stroke services  EMMI was resolved on 09/24/20 for smoked or been around smoke? 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 He wanted to consult with Brandon Gonzales. Questions were answered    Unsuccessful outreach occurred on 10/01/20  Successful outreach today 10/12/20 Patient is able to verify HIPAA ([place]) identifiers Reviewed and addressed the purpose of the follow up call with the patient  Consent: [facility] (Brandon Gonzales) RN CM reviewed [facility] services with patient. Patient gave verbal consent for services.  Follow up assessment Brandon Gonzales reports "I'm doing fine" He discussed he has been having outpatient therapy sessions and was doing so well that his sessions were changed from twice a week to once a week. He states he and the therapist are anticipating him completing sessions soon  Hypertension (HTN) He reports good management of his HTN/BP at home with monitoring twice a week and ranging around 130/80 -82 He continues to take medications as ordered without side effects or cost concerns He denies any further chest pains  Chronic Kidney disease (CKD)  Brandon Gonzales denies any CKD signs and symptoms (s/s) [facility] RN CM reviewed the listed   Change of primary care provider (PCP) He plans to go to establish care with a new pcp today  No MD case  closure letter to Brandon Gonzales sent   [facility] RN CM discussed keeping case open for possible further [facility] services but pt states x 3 his preference is for closure as he confirms "I am doing well now"    Plan [facility] case closure per patient request  plus pt encouraged to return a call to [facility] RN CM prn Case closure letter to patient  10/12/20 Pt requests case closure/goals met  [name], RN, BSN, [place] Coordinator Office number [phone] Main [facility] number [phone] Fax number [phone]

## 2020-10-13 ENCOUNTER — Encounter: Payer: Medicare HMO | Admitting: Occupational Therapy

## 2020-10-20 ENCOUNTER — Ambulatory Visit: Payer: Medicare HMO | Admitting: Occupational Therapy

## 2020-10-27 ENCOUNTER — Encounter: Payer: Medicare HMO | Admitting: Occupational Therapy

## 2020-11-01 ENCOUNTER — Encounter: Payer: Medicare HMO | Admitting: Occupational Therapy

## 2020-11-02 MED ORDER — CLOPIDOGREL BISULFATE 75 MG PO TABS: 75.0000 mg | ORAL_TABLET | Freq: Every day | ORAL | 3 refills | Status: DC

## 2020-11-02 NOTE — Telephone Encounter (Signed)
Looking at the hospital summary it was recommended he take ASA and Plavix for 3 weeks then discontinue ASA and stay on Plavix long term. We should send in Rx for Plavix 75 mg daily  Brandon Countess Martinique MD, [facility]

## 2020-11-05 ENCOUNTER — Telehealth: Payer: Medicare HMO | Admitting: Internal Medicine

## 2020-11-11 NOTE — Patient Instructions (Signed)
Thank you for choosing Primary Care at [facility] to be your medical home!    Brandon Gonzales was seen by Melina Schools, DO today.   Brandon Gonzales's primary care provider is Phill Myron, DO.   For the best care possible, you should try to see Phill Myron, DO whenever you come to the clinic.   We look forward to seeing you again soon!  If you have any questions about your visit today, please call us at [phone] or feel free to reach your primary care provider via [place].

## 2020-11-12 ENCOUNTER — Telehealth (INDEPENDENT_AMBULATORY_CARE_PROVIDER_SITE_OTHER): Payer: Medicare HMO | Admitting: Internal Medicine

## 2020-11-12 DIAGNOSIS — Z7689 Persons encountering health services in other specified circumstances: Secondary | ICD-10-CM | POA: Diagnosis not present

## 2020-11-12 DIAGNOSIS — B001 Herpesviral vesicular dermatitis: Secondary | ICD-10-CM

## 2020-11-12 MED ORDER — VALACYCLOVIR HCL 500 MG PO TABS: 500.0000 mg | ORAL_TABLET | Freq: Every day | ORAL | 0 refills | Status: DC | PRN

## 2020-11-12 NOTE — Progress Notes (Signed)
Virtual Visit via Telephone Note  I connected with Brandon Gonzales, on 11/12/2020 at 8:55 AM by telephone due to the COVID-19 pandemic and verified that I am speaking with the correct person using two identifiers.   Consent: I discussed the limitations, risks, security and privacy concerns of performing an evaluation and management service by telephone and the availability of in person appointments. I also discussed with the patient that there may be a patient responsible charge related to this service. The patient expressed understanding and agreed to proceed.   Location of Patient: Home   Location of Provider: Clinic    Persons participating in Telemedicine visit: Brandon Gonzales Heide Guile Dr. Juleen China      History of Present Illness: Patient has a visit to establish care. Was previously established with Mina Marble but she was relocated to another office.  Significant cardiac history--HTN, CHF, CAD, h/o MI.   History of recent CVA on Oct 13. Currently on Plavix.   History of squamous cell cancer. Sees dermatologist yearly.    Past Medical History:  Diagnosis Date  . Arthritis   . Cancer (HCC)    SKIN CA  SQUAMOUS CELL  . Coronary artery disease    a. 2007 Stent to LAD, PTCA D1;  b. DES to LAD & LCx 10/13 in the setting of STEMI.  [name] Kitchen Dyslipidemia    a. Statin and zetia-intolerant. On niacin.  [name] Kitchen GERD (gastroesophageal reflux disease)   . Heart murmur   . Hypertension   . Hypertrophic cardiomyopathy ([place])    a. 09/2012 Echo: EF 55%, mid to apical anterolateral, posterior, apical anterior, apical HK, Gr1 DD, turbulence across LVOT suggesting degree of obstruction, mild MR, mildly dil LA.   Allergies  Allergen Reactions  . Antihistamines, Chlorpheniramine-Type Other (See Comments)    Altered mental status  . Pheniramine Other (See Comments)    Altered mental status  . Statins Other (See Comments)    Leg cramps    . Zetia [Ezetimibe] Other (See Comments)     Leg cramps    Current Outpatient Medications on File Prior to Visit  Medication Sig Dispense Refill  . AMBULATORY NON FORMULARY MEDICATION Take 180 mg by mouth daily. Medication Name: bempedoic acid vs a placebo CLEAR Research Study drug provided    . amLODipine (NORVASC) 2.5 MG tablet TAKE 1 TABLET BY MOUTH EVERY DAY 90 tablet 2  . carvedilol (COREG) 12.5 MG tablet TAKE 0.5 TABLETS (6.25 MG TOTAL) BY MOUTH 2 (TWO) TIMES DAILY. 90 tablet 3  . clopidogrel (PLAVIX) 75 MG tablet Take 1 tablet (75 mg total) by mouth daily. 90 tablet 3  . esomeprazole (NEXIUM) 20 MG capsule Take 20 mg by mouth every morning.    . Inositol Niacinate (NIACIN FLUSH FREE) 500 MG CAPS Take 1,000 mg by mouth daily.    [name] Kitchen lisinopril (ZESTRIL) 40 MG tablet Take 1 tablet (40 mg total) by mouth daily. 90 tablet 3  . nitroGLYCERIN (NITROSTAT) 0.4 MG SL tablet Place 1 tablet (0.4 mg total) under the tongue every 5 (five) minutes as needed for chest pain. 25 tablet 3  . valACYclovir (VALTREX) 500 MG tablet Take 1 tablet (500 mg total) by mouth daily as needed (fever blisters). 90 tablet 0   No current facility-administered medications on file prior to visit.    Observations/Objective: NAD. Speaking clearly.  Work of breathing normal.  Alert and oriented. Mood appropriate.   Assessment and Plan: 1. Encounter to establish care Reviewed patient's PMH, social history,  surgical history, and medications.  Is overdue for annual exam, screening blood work, and health maintenance topics. Have asked patient to return for visit to address these items.   2. Herpes labialis - valACYclovir (VALTREX) 500 MG tablet; Take 1 tablet (500 mg total) by mouth daily as needed (fever blisters).  Dispense: 90 tablet; Refill: 0   Follow Up Instructions: Annual exam    I discussed the assessment and treatment plan with the patient. The patient was provided an opportunity to ask questions and all were answered. The patient agreed with the plan  and demonstrated an understanding of the instructions.   The patient was advised to call back or seek an in-person evaluation if the symptoms worsen or if the condition fails to improve as anticipated.     I provided 9 minutes total of non-face-to-face time during this encounter including median intraservice time, reviewing previous notes, investigations, ordering medications, medical decision making, coordinating care and patient verbalized understanding at the end of the visit.    Phill Myron, D.O. Primary Care at [facility]  11/12/2020, 8:55 AM

## 2020-11-16 ENCOUNTER — Telehealth: Payer: Self-pay | Admitting: *Deleted

## 2020-11-17 DIAGNOSIS — Z85828 Personal history of other malignant neoplasm of skin: Secondary | ICD-10-CM | POA: Diagnosis not present

## 2020-11-17 DIAGNOSIS — L820 Inflamed seborrheic keratosis: Secondary | ICD-10-CM | POA: Diagnosis not present

## 2020-11-17 DIAGNOSIS — L812 Freckles: Secondary | ICD-10-CM | POA: Diagnosis not present

## 2020-11-17 DIAGNOSIS — L821 Other seborrheic keratosis: Secondary | ICD-10-CM | POA: Diagnosis not present

## 2020-11-17 NOTE — Telephone Encounter (Addendum)
Called patient to confirm CLEAR appointment [phone] scheduled for 11/17/2020. Subject decided that they would no longer like to continue on IP and come in for visits. They did confirm that they would be ok with telephone calls for the remainder of the study. Subject said they felt like they knew they were on placebo and have been talking with their doctor about starting an injectable so they no longer wanted to continue. Subject said they would drop off their remaining IP whenever possible. There are no new AE's or SAE's to report to sponsor at this time. All concomitant medications have been reviewed and updated if applicable. Next phone call will be in approximately 3 months.

## 2020-11-24 ENCOUNTER — Encounter: Payer: Self-pay | Admitting: Internal Medicine

## 2020-11-24 ENCOUNTER — Ambulatory Visit (INDEPENDENT_AMBULATORY_CARE_PROVIDER_SITE_OTHER): Payer: Medicare HMO | Admitting: Internal Medicine

## 2020-11-24 ENCOUNTER — Other Ambulatory Visit: Payer: Self-pay

## 2020-11-24 ENCOUNTER — Ambulatory Visit (INDEPENDENT_AMBULATORY_CARE_PROVIDER_SITE_OTHER): Payer: Medicare HMO

## 2020-11-24 VITALS — BP 142/88 | HR 58 | Temp 97.3°F | Resp 17 | Ht 68.0 in | Wt 175.0 lb

## 2020-11-24 DIAGNOSIS — G8929 Other chronic pain: Secondary | ICD-10-CM

## 2020-11-24 DIAGNOSIS — Z0000 Encounter for general adult medical examination without abnormal findings: Secondary | ICD-10-CM

## 2020-11-24 DIAGNOSIS — M25512 Pain in left shoulder: Secondary | ICD-10-CM

## 2020-11-24 NOTE — Progress Notes (Signed)
Subjective:   Brandon Gonzales is a 69 y.o. male who presents for Medicare Annual/Subsequent preventive examination.  Patient reports concerns about left lateral shoulder pain. Has been present for about 6 months. Seems to be getting better but still present. Worsened with abduction above 90-100 degrees. Used to be unable to externally rotate arm without significant pain. Hurts to lay on his left side. No known h/o OA. Asks to see orthopedic specialist. Is followed by chiropractor q3 weeks for neck pain at C5-C6 region.   Review of Systems:  Patient reports no  vision/ hearing changes,anorexia, weight change, fever ,adenopathy, persistant / recurrent hoarseness, swallowing issues, chest pain, edema,persistant / recurrent cough, hemoptysis, dyspnea(rest, exertional, paroxysmal nocturnal), gastrointestinal  bleeding (melena, rectal bleeding), abdominal pain, excessive heart burn, GU symptoms(dysuria, hematuria, pyuria, voiding/incontinence  Issues) syncope, focal weakness, severe memory loss, concerning skin lesions, depression, anxiety, abnormal bruising/bleeding, major joint swelling.         Objective:    Vitals: BP (!) 142/88   Pulse (!) 58   Temp (!) 97.3 F (36.3 C) (Temporal)   Resp 17   Ht 5\' 8"  (1.727 m)   Wt 175 lb (79.4 kg)   SpO2 96%   BMI 26.61 kg/m   Body mass index is 26.61 kg/m.   Physical Exam Constitutional:      Appearance: Normal appearance.  HENT:     Head: Normocephalic and atraumatic.     Right Ear: Tympanic membrane and ear canal normal.     Left Ear: Tympanic membrane and ear canal normal.     Nose: Nose normal. No congestion.     Mouth/Throat:     Mouth: Mucous membranes are moist.     Pharynx: Oropharynx is clear. No oropharyngeal exudate.  Eyes:     Extraocular Movements: Extraocular movements intact.     Conjunctiva/sclera: Conjunctivae normal.     Pupils: Pupils are equal, round, and reactive to light.  Cardiovascular:     Rate and Rhythm: Normal  rate and regular rhythm.     Heart sounds: No murmur heard.   Pulmonary:     Effort: Pulmonary effort is normal. No respiratory distress.     Breath sounds: Normal breath sounds.  Abdominal:     General: Abdomen is flat. There is no distension.     Palpations: Abdomen is soft.  Musculoskeletal:        General: No deformity.     Cervical back: Normal range of motion and neck supple.     Comments: Left shoulder without any bony deformity. Has limited active and passive ROM with abduction past about 120 degrees. Otherwise, ROM intact. Strength is preserved.   Skin:    General: Skin is warm and dry.  Neurological:     General: No focal deficit present.     Mental Status: He is alert and oriented to person, place, and time.     Cranial Nerves: No cranial nerve deficit.  Psychiatric:        Mood and Affect: Mood normal.        Behavior: Behavior normal.     Advanced Directives 09/28/2020 09/16/2020 09/15/2020 10/24/2018 05/17/2017 05/16/2017 02/19/2017  Does Patient Have a Medical Advance Directive? No - No No No No No  Would patient like information on creating a medical advance directive? No - Patient declined No - Patient declined - Yes (ED - Information included in AVS) No - Patient declined - -  Pre-existing out of facility DNR order (yellow form  or pink MOST form) - - - - - - -    Tobacco Social History   Tobacco Use  Smoking Status Never Smoker  Smokeless Tobacco Never Used     Counseling given: Not Answered   Clinical Intake:  Pre-visit preparation completed: No  Pain : No/denies pain     Nutritional Status: BMI 25 -29 Overweight Diabetes: No  How often do you need to have someone help you when you read instructions, pamphlets, or other written materials from your doctor or pharmacy?: 1 - Never  Interpreter Needed?: No  Information entered by :: kmw  Past Medical History:  Diagnosis Date  . Arthritis   . Cancer (HCC)    SKIN CA  SQUAMOUS CELL  . Coronary  artery disease    a. 2007 Stent to LAD, PTCA D1;  b. DES to LAD & LCx 10/13 in the setting of STEMI.  [name] Kitchen Dyslipidemia    a. Statin and zetia-intolerant. On niacin.  [name] Kitchen GERD (gastroesophageal reflux disease)   . Heart murmur   . Hyperlipidemia    Phreesia 11/22/2020  . Hypertension   . Hypertrophic cardiomyopathy ([place])    a. 09/2012 Echo: EF 55%, mid to apical anterolateral, posterior, apical anterior, apical HK, Gr1 DD, turbulence across LVOT suggesting degree of obstruction, mild MR, mildly dil LA.  [name] Kitchen Myocardial infarction ([place])    Phreesia 11/22/2020  . Stroke [facility])    Phreesia 11/22/2020   Past Surgical History:  Procedure Laterality Date  . CARDIAC CATHETERIZATION  2007   stent to LAD and PTCA of diagonal branch  . CARDIAC CATHETERIZATION  2010  . CORONARY ANGIOPLASTY WITH STENT PLACEMENT  2013   95-99% prox LAD, patent LAD stent distal to this, occluded mid-dist LCx, mild <10% RCA irreg; s/p DES-prox LAD & DES to mid-dist LCx  . CORONARY BALLOON ANGIOPLASTY N/A 05/16/2017   Procedure: Coronary Balloon Angioplasty;  Surgeon: Leonie Man, MD;  Location: [place] CV LAB;  Service: Cardiovascular;  Laterality: N/A;  . [place]   right  . [place]   left  . LEFT HEART CATH Bilateral 09/22/2012   Procedure: LEFT HEART CATH;  Surgeon: Peter M Martinique, MD;  Location: [facility] CATH LAB;  Service: Cardiovascular;  Laterality: Bilateral;  . LEFT HEART CATH AND CORONARY ANGIOGRAPHY N/A 05/16/2017   Procedure: Left Heart Cath and Coronary Angiography;  Surgeon: Leonie Man, MD;  Location: [place] CV LAB;  Service: Cardiovascular;  Laterality: N/A;  . PERCUTANEOUS CORONARY STENT INTERVENTION (PCI-S) Right 09/22/2012   Procedure: PERCUTANEOUS CORONARY STENT INTERVENTION (PCI-S);  Surgeon: Peter M Martinique, MD;  Location: [facility] C[facility] LAB;  Service: Cardiovascular;  Laterality: Right;   Family History  Problem Relation Age of Onset  . Heart attack Mother 32  . Heart  disease Mother    Social History   Socioeconomic History  . Marital status: Married    Spouse name: Shirlean Mylar  . Number of children: Not on file  . Years of education: high school   . Highest education level: High school graduate  Occupational History  . Occupation: retired Airline pilot   Tobacco Use  . Smoking status: Never Smoker  . Smokeless tobacco: Never Used  Vaping Use  . Vaping Use: Never used  Substance and Sexual Activity  . Alcohol use: No  . Drug use: No  . Sexual activity: Not Currently  Other Topics Concern  . Not on file  Social History Narrative   Retired Airline pilot  Social Determinants of Health   Financial Resource Strain: Not on file  Food Insecurity: Not on file  Transportation Needs: Not on file  Physical Activity: Not on file  Stress: Not on file  Social Connections: Not on file    Outpatient Encounter Medications as of 11/24/2020  Medication Sig  . amLODipine (NORVASC) 2.5 MG tablet TAKE 1 TABLET BY MOUTH EVERY DAY  . betamethasone dipropionate 0.05 % cream SMARTSIG:1 Topical Every Night  . carvedilol (COREG) 12.5 MG tablet TAKE 0.5 TABLETS (6.25 MG TOTAL) BY MOUTH 2 (TWO) TIMES DAILY.  [name] Kitchen clopidogrel (PLAVIX) 75 MG tablet Take 1 tablet (75 mg total) by mouth daily.  [name] Kitchen esomeprazole (NEXIUM) 20 MG capsule Take 20 mg by mouth every morning.  . Inositol Niacinate 500 MG CAPS Take 1,000 mg by mouth daily.  [name] Kitchen lisinopril (ZESTRIL) 40 MG tablet Take 1 tablet (40 mg total) by mouth daily.  . nitroGLYCERIN (NITROSTAT) 0.4 MG SL tablet Place 1 tablet (0.4 mg total) under the tongue every 5 (five) minutes as needed for chest pain.  . valACYclovir (VALTREX) 500 MG tablet Take 1 tablet (500 mg total) by mouth daily as needed (fever blisters).  . [DISCONTINUED] AMBULATORY NON FORMULARY MEDICATION Take 180 mg by mouth daily. Medication Name: bempedoic acid vs a placebo CLEAR Research Study drug provided   No facility-administered encounter medications on file as  of 11/24/2020.    Activities of Daily Living In your present state of health, do you have any difficulty performing the following activities: 11/24/2020 09/16/2020  Hearing? N N  Vision? N N  Difficulty concentrating or making decisions? N N  Walking or climbing stairs? N N  Dressing or bathing? N N  Doing errands, shopping? N N  Preparing Food and eating ? N -  Using the Toilet? N -  In the past six months, have you accidently leaked urine? N -  Do you have problems with loss of bowel control? N -  Managing your Medications? N -  Managing your Finances? N -  Housekeeping or managing your Housekeeping? N -  Some recent data might be hidden    Patient Care Team: Nicolette Bang, DO as PCP - General (Family Medicine) Martinique, Peter M, MD as PCP - Cardiology (Cardiology) Jarome Matin, MD as Consulting Physician (Dermatology) Melida Quitter, MD as Consulting Physician (Otolaryngology)   Assessment:   This is a routine wellness examination for [place].  Exercise Activities and Dietary recommendations Current Exercise Habits: Home exercise routine, Exercise limited by: None identified  Goals   None     Fall Risk Fall Risk  11/24/2020 01/31/2018 12/28/2016  Falls in the past year? 0 No No  Number falls in past yr: 0 - -  Injury with Fall? 0 - -   Is the patient's home free of loose throw rugs in walkways, pet beds, electrical cords, etc?   yes      Grab bars in the bathroom? no      Handrails on the stairs?   yes      Adequate lighting?   yes  Timed Get Up and Go Performed: Yes, passed Patient rating of health (0-10): 9   Depression Screen PHQ 2/9 Scores 11/24/2020 11/05/2019 02/26/2018 01/31/2018  PHQ - 2 Score 0 0 0 0  PHQ- 9 Score - 3 0 0    Cognitive Function MMSE - Mini Mental State Exam 11/24/2020  Orientation to time 5  Orientation to Place 5  Registration 3  Attention/ Calculation  5  Recall 2  Language- name 2 objects 2  Language- repeat 1   Language- follow 3 step command 3  Language- read & follow direction 1  Write a sentence 1  Copy design 1  Total score 29        Immunization History  Administered Date(s) Administered  . Fluad Quad(high Dose 65+) 09/17/2019  . Influenza, High Dose Seasonal PF 12/04/2017  . Influenza-Unspecified 12/04/2017    Screening Tests Health Maintenance  Topic Date Due  . COVID-19 Vaccine (1) Never done  . TETANUS/TDAP  Never done  . PNA vac Low Risk Adult (1 of 2 - PCV13) Never done  . INFLUENZA VACCINE  07/04/2020  . COLONOSCOPY  09/27/2027  . Hepatitis C Screening  Completed   Cancer Screenings: Lung: Low Dose CT Chest recommended if Age 55-80 years, 30 pack-year currently smoking OR have quit w/in 15years. Patient does not qualify---never smoker. Colorectal: Normal 2018.   Additional Screenings: None       Plan:     1. Medicare annual wellness visit, subsequent Plans to receive flu vaccine at local pharmacy. Declines PNA and Tdap vaccinations.    I have personally reviewed and noted the following in the patient's chart:   . Medical and social history . Use of alcohol, tobacco or illicit drugs  . Current medications and supplements . Functional ability and status . Nutritional status . Physical activity . Advanced directives . List of other physicians . Hospitalizations, surgeries, and ER visits in previous 12 months . Vitals . Screenings to include cognitive, depression, and falls . Referrals and appointments  In addition, I have reviewed and discussed with patient certain preventive protocols, quality metrics, and best practice recommendations. A written personalized care plan for preventive services as well as general preventive health recommendations were provided to patient.  2. Chronic left shoulder pain Differential remains broad. Will obtain shoulder imaging to evaluate for OA, especially given age. Rotator cuff tendonitis or tear possible given reduced  ROM and lateral pain. Reassuring that strength is preserved that likely not a full thickness tear. Given report of C5-6 dysfunction, nerve impingement may be possibility given location of pain. Am also concerned for component of adhesive capsulitis given fairly firm endpoint barrier with passive ROM. Will refer to orthopedics for further evaluation.  - DG Shoulder Left; Future - Ambulatory referral to [place], DO  11/24/2020

## 2020-12-02 ENCOUNTER — Encounter: Payer: Medicare HMO | Admitting: Internal Medicine

## 2020-12-05 ENCOUNTER — Other Ambulatory Visit: Payer: Self-pay | Admitting: Cardiology

## 2020-12-22 ENCOUNTER — Ambulatory Visit (INDEPENDENT_AMBULATORY_CARE_PROVIDER_SITE_OTHER): Payer: Medicare HMO | Admitting: Orthopaedic Surgery

## 2020-12-22 ENCOUNTER — Encounter: Payer: Self-pay | Admitting: Orthopaedic Surgery

## 2020-12-22 DIAGNOSIS — M25512 Pain in left shoulder: Secondary | ICD-10-CM | POA: Diagnosis not present

## 2020-12-22 DIAGNOSIS — G8929 Other chronic pain: Secondary | ICD-10-CM

## 2020-12-22 MED ORDER — METHYLPREDNISOLONE ACETATE 40 MG/ML IJ SUSP
40.0000 mg | INTRAMUSCULAR | Status: AC | PRN
Start: 2020-12-22 — End: 2020-12-22
  Administered 2020-12-22: 40 mg via INTRA_ARTICULAR

## 2020-12-22 MED ORDER — BUPIVACAINE HCL 0.25 % IJ SOLN
2.0000 mL | INTRAMUSCULAR | Status: AC | PRN
Start: 2020-12-22 — End: 2020-12-22
  Administered 2020-12-22: 2 mL via INTRA_ARTICULAR

## 2020-12-22 MED ORDER — LIDOCAINE HCL 2 % IJ SOLN
2.0000 mL | INTRAMUSCULAR | Status: AC | PRN
  Administered 2020-12-22: 2 mL

## 2020-12-22 NOTE — Progress Notes (Signed)
Office Visit Note   Patient: Brandon Gonzales           Date of Birth: 1951-04-19           MRN: [id number] Visit Date: 12/22/2020              Requested by: Nicolette Bang, DO [place],   [id number] PCP: Nicolette Bang, DO   Assessment & Plan: Visit Diagnoses:  1. Chronic left shoulder pain     Plan: Impression is left shoulder subacromial bursitis.  Today, we injected the left shoulder with cortisone.  I have also provided the patient with a Jobe exercise program.  He will follow-up with [place] as needed.  Follow-Up Instructions: Return if symptoms worsen or fail to improve.   Orders:  Orders Placed This Encounter  Procedures  . Large Joint Inj: L subacromial bursa   No orders of the defined types were placed in this encounter.     Procedures: Large Joint Inj: L subacromial bursa on 12/22/2020 10:37 AM Indications: pain Details: 22 G needle Medications: 2 mL lidocaine 2 %; 2 mL bupivacaine 0.25 %; 40 mg methylPREDNISolone acetate 40 MG/ML Outcome: tolerated well, no immediate complications Patient was prepped and draped in the usual sterile fashion.       Clinical Data: No additional findings.   Subjective: Chief Complaint  Patient presents with  . Left Shoulder - Pain    HPI patient is a pleasant 70 year old gentleman who comes in today with 6 months of left shoulder pain.  No known injury or change in activity.  The pain he has is to the entire shoulder and radiates into the deltoid.  His pain is worse with abduction of the shoulder past about 90 degrees.  He also has pain sleeping on the left side.  He denies any weakness.  He does not take any over-the-counter pain medication for this.  He does note remote but occasional paresthesias to the ring and small fingers of the left hand but this is subsided.  He does have a previous history of CVA.  Review of Systems as detailed in HPI.  All others reviewed and are  negative.   Objective: Vital Signs: There were no vitals taken for this visit.  Physical Exam well-developed and well-nourished gentleman in no acute distress.  Alert and oriented x3.  Ortho Exam left shoulder exam shows full forward flexion and external rotation.  He can internally rotate to L5.  He can abduct to about 90 degrees without pain.  Positive empty can test.  Negative cross body adduction.  Near full strength throughout.  He is neurovascular intact distally.  Specialty Comments:  No specialty comments available.  Imaging: X-rays of the left shoulder reviewed by me in canopy show no acute or structural abnormalities   PMFS History: Patient Active Problem List   Diagnosis Date Noted  . Acute ischemic stroke ([place]) 09/15/2020  . Near syncope 12/17/2019  . Statin myopathy 11/13/2019  . Herpes labialis 01/31/2018  . Essential hypertension   . Heart murmur   . GERD (gastroesophageal reflux disease)   . CAD S/P percutaneous coronary angioplasty   . Cancer ([place])   . Angina at rest [facility]) 05/17/2017  . Hypertrophic cardiomyopathy ([place])   . S/P PTCA (percutaneous transluminal coronary angioplasty)   . Unstable angina ([place])   . Positional vertigo 04/24/2016  . NSVT (nonsustained ventricular tachycardia) ([place]) 09/25/2012  . Hypertrophic obstructive cardiomyopathy(425.11) 09/25/2012  . CKD (chronic kidney  disease) stage 3, GFR 30-59 ml/min (HCC) 09/25/2012  . ST elevation myocardial infarction (STEMI) of lateral wall ([place]) 09/22/2012  . CAD (coronary artery disease) 01/11/2012  . Dyslipidemia   . H/O gastroesophageal reflux (GERD)   . Arthritis    Past Medical History:  Diagnosis Date  . Arthritis   . Cancer (HCC)    SKIN CA  SQUAMOUS CELL  . Coronary artery disease    a. 2007 Stent to LAD, PTCA D1;  b. DES to LAD & LCx 10/13 in the setting of STEMI.  [name] Kitchen Dyslipidemia    a. Statin and zetia-intolerant. On niacin.  [name] Kitchen GERD (gastroesophageal reflux disease)   . Heart  murmur   . Hyperlipidemia    Phreesia 11/22/2020  . Hypertension   . Hypertrophic cardiomyopathy ([place])    a. 09/2012 Echo: EF 55%, mid to apical anterolateral, posterior, apical anterior, apical HK, Gr1 DD, turbulence across LVOT suggesting degree of obstruction, mild MR, mildly dil LA.  [name] Kitchen Myocardial infarction ([place])    Phreesia 11/22/2020  . Stroke [facility])    Phreesia 11/22/2020    Family History  Problem Relation Age of Onset  . Heart attack Mother 48  . Heart disease Mother     Past Surgical History:  Procedure Laterality Date  . CARDIAC CATHETERIZATION  2007   stent to LAD and PTCA of diagonal branch  . CARDIAC CATHETERIZATION  2010  . CORONARY ANGIOPLASTY WITH STENT PLACEMENT  2013   95-99% prox LAD, patent LAD stent distal to this, occluded mid-dist LCx, mild <10% RCA irreg; s/p DES-prox LAD & DES to mid-dist LCx  . CORONARY BALLOON ANGIOPLASTY N/A 05/16/2017   Procedure: Coronary Balloon Angioplasty;  Surgeon: Leonie Man, MD;  Location: [place] CV LAB;  Service: Cardiovascular;  Laterality: N/A;  . [place]   right  . [place]   left  . LEFT HEART CATH Bilateral 09/22/2012   Procedure: LEFT HEART CATH;  Surgeon: Peter M Martinique, MD;  Location: [facility] CATH LAB;  Service: Cardiovascular;  Laterality: Bilateral;  . LEFT HEART CATH AND CORONARY ANGIOGRAPHY N/A 05/16/2017   Procedure: Left Heart Cath and Coronary Angiography;  Surgeon: Leonie Man, MD;  Location: [place] CV LAB;  Service: Cardiovascular;  Laterality: N/A;  . PERCUTANEOUS CORONARY STENT INTERVENTION (PCI-S) Right 09/22/2012   Procedure: PERCUTANEOUS CORONARY STENT INTERVENTION (PCI-S);  Surgeon: Peter M Martinique, MD;  [facility] C[facility] LAB;  Service: Cardiovascular;  Laterality: Right;   Social History   Occupational History  . Occupation: retired Airline pilot   Tobacco Use  . Smoking status: Never Smoker  . Smokeless tobacco: Never Used  Vaping Use  . Vaping Use: Never used   Substance and Sexual Activity  . Alcohol use: No  . Drug use: No  . Sexual activity: Not Currently

## 2021-01-17 ENCOUNTER — Other Ambulatory Visit: Payer: Self-pay | Admitting: Cardiology

## 2021-02-18 ENCOUNTER — Telehealth: Payer: Self-pay | Admitting: Cardiology

## 2021-02-18 ENCOUNTER — Telehealth: Payer: Self-pay | Admitting: *Deleted

## 2021-02-18 NOTE — Telephone Encounter (Signed)
He needs follow up with EP. Saw Dr Lovena Le in the hospital  Elyshia Kumagai Martinique MD, [facility]

## 2021-02-18 NOTE — Telephone Encounter (Signed)
Spoke with pt, he reports that since his stroke in October it was recommended to get a pacemaker and before he will do that he had wanted dr Doug Sou option but the message never got to dr Martinique. Now, every time he lays down his heart will start beating irregular. He also reports this happens when he goes up the stairs or walks too fast. He will feel a little faint or weird and will have to sit down. After about 30 minutes it will calm down. He has not checked his bp recently. His carvedilol is 6.25 mg twice daily. The first available appointment with dr Martinique is may and the patient wants to know what to do. He values dr jordan;s option. Will forward for dr jordan's review.

## 2021-02-18 NOTE — Telephone Encounter (Signed)
Per patient schedule message:  "I am experiencing some rhythm problems with my heart. Stopping at times and irregular heartbeats. This needs to be addresses as soon as possible. Please let me know if Dr. Lilian Coma work me in or if he has a cancelation"  I scheduled the patient for 03/16/21 at 10:15 AM with Coletta Memos and added him to the wait list.

## 2021-02-18 NOTE — Telephone Encounter (Signed)
Completed phone call/medical chart review for visit T17, M45 in the CLEAR research study. This subject is no longer on IP and is followed by telephone only. All concomitant medications have been reviewed and updated if applicable. There are no new AE's or SAE's to report at this time. Next phone call will be in approximately 3 months.

## 2021-02-21 NOTE — Telephone Encounter (Signed)
Spoke to patient 3/18 Dr.Jordan's advice given.Appointment scheduled with Dr.Taylor 3/22 at 11:15 am.

## 2021-02-22 ENCOUNTER — Other Ambulatory Visit: Payer: Self-pay

## 2021-02-22 ENCOUNTER — Ambulatory Visit: Payer: Medicare HMO | Admitting: Internal Medicine

## 2021-02-22 ENCOUNTER — Encounter: Payer: Self-pay | Admitting: Internal Medicine

## 2021-02-22 VITALS — BP 132/84 | HR 67 | Ht 68.0 in | Wt 179.8 lb

## 2021-02-22 DIAGNOSIS — I422 Other hypertrophic cardiomyopathy: Secondary | ICD-10-CM | POA: Diagnosis not present

## 2021-02-22 DIAGNOSIS — I472 Ventricular tachycardia: Secondary | ICD-10-CM | POA: Diagnosis not present

## 2021-02-22 DIAGNOSIS — R55 Syncope and collapse: Secondary | ICD-10-CM

## 2021-02-22 DIAGNOSIS — I4729 Other ventricular tachycardia: Secondary | ICD-10-CM

## 2021-02-22 LAB — CBC WITH DIFFERENTIAL/PLATELET
Basophils Absolute: 0 10*3/uL (ref 0.0–0.2)
Basos: 0 %
EOS (ABSOLUTE): 0.1 10*3/uL (ref 0.0–0.4)
Eos: 2 %
Hematocrit: 43.4 % (ref 37.5–51.0)
Hemoglobin: 15.6 g/dL (ref 13.0–17.7)
Immature Grans (Abs): 0 10*3/uL (ref 0.0–0.1)
Immature Granulocytes: 1 %
Lymphocytes Absolute: 1.2 10*3/uL (ref 0.7–3.1)
Lymphs: 17 %
MCH: 32 pg (ref 26.6–33.0)
MCHC: 35.9 g/dL — ABNORMAL HIGH (ref 31.5–35.7)
MCV: 89 fL (ref 79–97)
Monocytes Absolute: 0.5 10*3/uL (ref 0.1–0.9)
Monocytes: 8 %
Neutrophils Absolute: 4.8 10*3/uL (ref 1.4–7.0)
Neutrophils: 72 %
Platelets: 171 10*3/uL (ref 150–450)
RBC: 4.87 x10E6/uL (ref 4.14–5.80)
RDW: 12.5 % (ref 11.6–15.4)
WBC: 6.7 10*3/uL (ref 3.4–10.8)

## 2021-02-22 LAB — BASIC METABOLIC PANEL
BUN/Creatinine Ratio: 14 (ref 10–24)
BUN: 18 mg/dL (ref 8–27)
CO2: 22 mmol/L (ref 20–29)
Calcium: 9.6 mg/dL (ref 8.6–10.2)
Chloride: 102 mmol/L (ref 96–106)
Creatinine, Ser: 1.29 mg/dL — ABNORMAL HIGH (ref 0.76–1.27)
Glucose: 108 mg/dL — ABNORMAL HIGH (ref 65–99)
Potassium: 4.3 mmol/L (ref 3.5–5.2)
Sodium: 141 mmol/L (ref 134–144)
eGFR: 59 mL/min/{1.73_m2} — ABNORMAL LOW (ref >59)

## 2021-02-22 NOTE — Progress Notes (Signed)
HPI Mr. Brandon Gonzales returns today for followup. He is a pleasant 70 yo man with a cryptogenic stroke and now a full recovery, HCM, and sinus node dysfunction. I have seen him back in April and recommended an ICD for primary prevention of sudden death. He wanted to wait. He was seen in the hospital after his stroke to discuss ILR insertion but was felt by Dr. Greggory Brandy to be better off with a recurrent discussion about placement of a DDD ICD for both prevention of sudden death and the ability to detect atrial fib. He notes occiasional palps in the interim. No syncope. Allergies  Allergen Reactions  . Antihistamines, Chlorpheniramine-Type Other (See Comments)    Altered mental status  . Pheniramine Other (See Comments)    Altered mental status  . Statins Other (See Comments)    Leg cramps    . Zetia [Ezetimibe] Other (See Comments)    Leg cramps     Current Outpatient Medications  Medication Sig Dispense Refill  . amLODipine (NORVASC) 2.5 MG tablet TAKE 1 TABLET BY MOUTH EVERY DAY 90 tablet 2  . betamethasone dipropionate 0.05 % cream SMARTSIG:1 Topical Every Night    . carvedilol (COREG) 12.5 MG tablet TAKE 0.5 TABLETS (6.25 MG TOTAL) BY MOUTH 2 (TWO) TIMES DAILY. 90 tablet 3  . clopidogrel (PLAVIX) 75 MG tablet Take 1 tablet (75 mg total) by mouth daily. 90 tablet 3  . esomeprazole (NEXIUM) 20 MG capsule Take 20 mg by mouth every morning.    . Inositol Niacinate 500 MG CAPS Take 1,000 mg by mouth daily.    [name] Kitchen lisinopril (ZESTRIL) 40 MG tablet TAKE 1 TABLET BY MOUTH EVERY DAY 90 tablet 3  . valACYclovir (VALTREX) 500 MG tablet Take 1 tablet (500 mg total) by mouth daily as needed (fever blisters). 90 tablet 0  . nitroGLYCERIN (NITROSTAT) 0.4 MG SL tablet Place 1 tablet (0.4 mg total) under the tongue every 5 (five) minutes as needed for chest pain. 25 tablet 3   No current facility-administered medications for this visit.     Past Medical History:  Diagnosis Date  . Arthritis   . Cancer  (HCC)    SKIN CA  SQUAMOUS CELL  . Coronary artery disease    a. 2007 Stent to LAD, PTCA D1;  b. DES to LAD & LCx 10/13 in the setting of STEMI.  [name] Kitchen Dyslipidemia    a. Statin and zetia-intolerant. On niacin.  [name] Kitchen GERD (gastroesophageal reflux disease)   . Heart murmur   . Hyperlipidemia    Phreesia 11/22/2020  . Hypertension   . Hypertrophic cardiomyopathy ([place])    a. 09/2012 Echo: EF 55%, mid to apical anterolateral, posterior, apical anterior, apical HK, Gr1 DD, turbulence across LVOT suggesting degree of obstruction, mild MR, mildly dil LA.  [name] Kitchen Myocardial infarction ([place])    Phreesia 11/22/2020  . Stroke [facility])    Phreesia 11/22/2020    ROS:   All systems reviewed and negative except as noted in the HPI.   Past Surgical History:  Procedure Laterality Date  . CARDIAC CATHETERIZATION  2007   stent to LAD and PTCA of diagonal branch  . CARDIAC CATHETERIZATION  2010  . CORONARY ANGIOPLASTY WITH STENT PLACEMENT  2013   95-99% prox LAD, patent LAD stent distal to this, occluded mid-dist LCx, mild <10% RCA irreg; s/p DES-prox LAD & DES to mid-dist LCx  . CORONARY BALLOON ANGIOPLASTY N/A 05/16/2017   Procedure: Coronary Balloon Angioplasty;  Surgeon: Glenetta Hew  W, MD;  Location: [facility] CV LAB;  Service: Cardiovascular;  Laterality: N/A;  . [place]   right  . [place]   left  . LEFT HEART CATH Bilateral 09/22/2012   Procedure: LEFT HEART CATH;  Surgeon: Peter M Martinique, MD;  Location: [facility] CATH LAB;  Service: Cardiovascular;  Laterality: Bilateral;  . LEFT HEART CATH AND CORONARY ANGIOGRAPHY N/A 05/16/2017   Procedure: Left Heart Cath and Coronary Angiography;  Surgeon: Leonie Man, MD;  Location: [facility] CV LAB;  Service: Cardiovascular;  Laterality: N/A;  . PERCUTANEOUS CORONARY STENT INTERVENTION (PCI-S) Right 09/22/2012   Procedure: PERCUTANEOUS CORONARY STENT INTERVENTION (PCI-S);  Surgeon: Peter M Martinique, MD;  Location: [facility] CATH LAB;  Service:  Cardiovascular;  Laterality: Right;     Family History  Problem Relation Age of Onset  . Heart attack Mother 22  . Heart disease Mother      Social History   Socioeconomic History  . Marital status: Married    Spouse name: Brandon Gonzales  . Number of children: Not on file  . Years of education: high school   . Highest education level: High school graduate  Occupational History  . Occupation: retired Airline pilot   Tobacco Use  . Smoking status: Never Smoker  . Smokeless tobacco: Never Used  Vaping Use  . Vaping Use: Never used  Substance and Sexual Activity  . Alcohol use: No  . Drug use: No  . Sexual activity: Not Currently  Other Topics Concern  . Not on file  Social History Narrative   Retired Airline pilot   Social Determinants of Radio broadcast assistant Strain: Not on [employer] Insecurity: Not on file  Transportation Needs: Not on file  Physical Activity: Not on file  Stress: Not on file  Social Connections: Not on file  Intimate Partner Violence: Not on file     BP 132/84   Pulse 67   Ht 5\' 8"  (1.727 m)   Wt 179 lb 12.8 oz (81.6 kg)   SpO2 93%   BMI 27.34 kg/m   Physical Exam:  Well appearing NAD HEENT: Unremarkable Neck:  No JVD, no thyromegally Lymphatics:  No adenopathy Back:  No CVA tenderness Lungs:  Clear HEART:  Regular rate rhythm, no murmurs, no rubs, no clicks Abd:  soft, positive bowel sounds, no organomegally, no rebound, no guarding Ext:  2 plus pulses, no edema, no cyanosis, no clubbing Skin:  No rashes no nodules Neuro:  CN II through XII intact, motor grossly intact  EKG - NSR with RBBB  DEVICE  Normal device function.  See PaceArt for details.   Assess/Plan:  1. HCM - he has NSVT and gadolinium uptake by MRI and a h/o near syncope. I have recommended DDD ICD insertion for primary prevention of sudden death. 2. Sinus node dysfunction - he is asymptomatic, despite HR's in the 40's at times. 3. VT - he has non-sustained VT.   4. Stroke - we discussed ILR vs DDD ICD. I recommended the latter.   Carleene Overlie Taylor,MD

## 2021-02-22 NOTE — H&P (View-Only) (Signed)
    HPI Mr. Brandon Gonzales returns today for followup. He is a pleasant 69 yo man with a cryptogenic stroke and now a full recovery, HCM, and sinus node dysfunction. I have seen him back in April and recommended an ICD for primary prevention of sudden death. He wanted to wait. He was seen in the hospital after his stroke to discuss ILR insertion but was felt by Dr. Ja to be better off with a recurrent discussion about placement of a DDD ICD for both prevention of sudden death and the ability to detect atrial fib. He notes occiasional palps in the interim. No syncope. Allergies  Allergen Reactions  . Antihistamines, Chlorpheniramine-Type Other (See Comments)    Altered mental status  . Pheniramine Other (See Comments)    Altered mental status  . Statins Other (See Comments)    Leg cramps    . Zetia [Ezetimibe] Other (See Comments)    Leg cramps     Current Outpatient Medications  Medication Sig Dispense Refill  . amLODipine (NORVASC) 2.5 MG tablet TAKE 1 TABLET BY MOUTH EVERY DAY 90 tablet 2  . betamethasone dipropionate 0.05 % cream SMARTSIG:1 Topical Every Night    . carvedilol (COREG) 12.5 MG tablet TAKE 0.5 TABLETS (6.25 MG TOTAL) BY MOUTH 2 (TWO) TIMES DAILY. 90 tablet 3  . clopidogrel (PLAVIX) 75 MG tablet Take 1 tablet (75 mg total) by mouth daily. 90 tablet 3  . esomeprazole (NEXIUM) 20 MG capsule Take 20 mg by mouth every morning.    . Inositol Niacinate 500 MG CAPS Take 1,000 mg by mouth daily.    . lisinopril (ZESTRIL) 40 MG tablet TAKE 1 TABLET BY MOUTH EVERY DAY 90 tablet 3  . valACYclovir (VALTREX) 500 MG tablet Take 1 tablet (500 mg total) by mouth daily as needed (fever blisters). 90 tablet 0  . nitroGLYCERIN (NITROSTAT) 0.4 MG SL tablet Place 1 tablet (0.4 mg total) under the tongue every 5 (five) minutes as needed for chest pain. 25 tablet 3   No current facility-administered medications for this visit.     Past Medical History:  Diagnosis Date  . Arthritis   . Cancer  (HCC)    SKIN CA  SQUAMOUS CELL  . Coronary artery disease    a. 2007 Stent to LAD, PTCA D1;  b. DES to LAD & LCx 10/13 in the setting of STEMI.  . Dyslipidemia    a. Statin and zetia-intolerant. On niacin.  . GERD (gastroesophageal reflux disease)   . Heart murmur   . Hyperlipidemia    Phreesia 11/22/2020  . Hypertension   . Hypertrophic cardiomyopathy (HCC)    a. 09/2012 Echo: EF 55%, mid to apical anterolateral, posterior, apical anterior, apical HK, Gr1 DD, turbulence across LVOT suggesting degree of obstruction, mild MR, mildly dil LA.  . Myocardial infarction (HCC)    Phreesia 11/22/2020  . Stroke (HCC)    Phreesia 11/22/2020    ROS:   All systems reviewed and negative except as noted in the HPI.   Past Surgical History:  Procedure Laterality Date  . CARDIAC CATHETERIZATION  2007   stent to LAD and PTCA of diagonal branch  . CARDIAC CATHETERIZATION  2010  . CORONARY ANGIOPLASTY WITH STENT PLACEMENT  2013   95-99% prox LAD, patent LAD stent distal to this, occluded mid-dist LCx, mild <10% RCA irreg; s/p DES-prox LAD & DES to mid-dist LCx  . CORONARY BALLOON ANGIOPLASTY N/A 05/16/2017   Procedure: Coronary Balloon Angioplasty;  Surgeon: Harding, David   W, MD;  Location: [facility];  Service: Cardiovascular;  Laterality: N/A;  . ELBOW SURGERY  1996   right  . KNEE SURGERY  1993   left  . LEFT HEART CATH Bilateral 09/22/2012   Procedure: LEFT HEART CATH;  Surgeon: Peter M Jordan, MD;  Location: [facility] CATH LAB;  Service: Cardiovascular;  Laterality: Bilateral;  . LEFT HEART CATH AND CORONARY ANGIOGRAPHY N/A 05/16/2017   Procedure: Left Heart Cath and Coronary Angiography;  Surgeon: Harding, David W, MD;  Location: [facility];  Service: Cardiovascular;  Laterality: N/A;  . PERCUTANEOUS CORONARY STENT INTERVENTION (PCI-S) Right 09/22/2012   Procedure: PERCUTANEOUS CORONARY STENT INTERVENTION (PCI-S);  Surgeon: Peter M Jordan, MD;  Location: [facility] CATH LAB;  Service:  Cardiovascular;  Laterality: Right;     Family History  Problem Relation Age of Onset  . Heart attack Mother 66  . Heart disease Mother      Social History   Socioeconomic History  . Marital status: Married    Spouse name: Jayne  . Number of children: Not on file  . Years of education: high school   . Highest education level: High school graduate  Occupational History  . Occupation: retired firefighter   Tobacco Use  . Smoking status: Never Smoker  . Smokeless tobacco: Never Used  Vaping Use  . Vaping Use: Never used  Substance and Sexual Activity  . Alcohol use: No  . Drug use: No  . Sexual activity: Not Currently  Other Topics Concern  . Not on file  Social History Narrative   Retired firefighter   Social Determinants of Health   Financial Resource Strain: Not on file  Food Insecurity: Not on file  Transportation Needs: Not on file  Physical Activity: Not on file  Stress: Not on file  Social Connections: Not on file  Intimate Partner Violence: Not on file     BP 132/84   Pulse 67   Ht 5' 8" (1.727 m)   Wt 179 lb 12.8 oz (81.6 kg)   SpO2 93%   BMI 27.34 kg/m   Physical Exam:  Well appearing NAD HEENT: Unremarkable Neck:  No JVD, no thyromegally Lymphatics:  No adenopathy Back:  No CVA tenderness Lungs:  Clear HEART:  Regular rate rhythm, no murmurs, no rubs, no clicks Abd:  soft, positive bowel sounds, no organomegally, no rebound, no guarding Ext:  2 plus pulses, no edema, no cyanosis, no clubbing Skin:  No rashes no nodules Neuro:  CN II through XII intact, motor grossly intact  EKG - NSR with RBBB  DEVICE  Normal device function.  See PaceArt for details.   Assess/Plan:  1. HCM - he has NSVT and gadolinium uptake by MRI and a h/o near syncope. I have recommended DDD ICD insertion for primary prevention of sudden death. 2. Sinus node dysfunction - he is asymptomatic, despite HR's in the 40's at times. 3. VT - he has non-sustained VT.   4. Stroke - we discussed ILR vs DDD ICD. I recommended the latter.   Malikah Lakey,MD 

## 2021-02-22 NOTE — Patient Instructions (Addendum)
Medication Instructions:  Your physician recommends that you continue on your current medications as directed. Please refer to the Current Medication list given to you today.  Labwork: You will get lab work today:  BMP and CBC  Testing/Procedures: Your physician has recommended that you have a defibrillator inserted. An implantable cardioverter defibrillator (ICD) is a small device that is placed in your chest or, in rare cases, your abdomen. This device uses electrical pulses or shocks to help control life-threatening, irregular heartbeats that could lead the heart to suddenly stop beating (sudden cardiac arrest). Leads are attached to the ICD that goes into your heart. This is done in the hospital and usually requires an overnight stay. Please see the instruction sheet given to you today for more information.   Follow-Up: SEE INSTRUCTION LETTER  Any Other Special Instructions Will Be Listed Below (If Applicable).  If you need a refill on your cardiac medications before your next appointment, please call your pharmacy.   ICD shared decision tool given

## 2021-03-01 ENCOUNTER — Emergency Department (HOSPITAL_COMMUNITY): Payer: Medicare HMO

## 2021-03-01 ENCOUNTER — Emergency Department (HOSPITAL_BASED_OUTPATIENT_CLINIC_OR_DEPARTMENT_OTHER): Payer: Medicare HMO

## 2021-03-01 ENCOUNTER — Other Ambulatory Visit: Payer: Self-pay

## 2021-03-01 ENCOUNTER — Encounter (HOSPITAL_COMMUNITY): Payer: Self-pay

## 2021-03-01 ENCOUNTER — Emergency Department (HOSPITAL_COMMUNITY)
Admission: EM | Admit: 2021-03-01 | Discharge: 2021-03-01 | Disposition: A | Discharge status: Home / Self Care | Payer: Medicare HMO | Attending: Emergency Medicine | Admitting: Emergency Medicine

## 2021-03-01 DIAGNOSIS — R0789 Other chest pain: Secondary | ICD-10-CM | POA: Diagnosis not present

## 2021-03-01 DIAGNOSIS — R079 Chest pain, unspecified: Secondary | ICD-10-CM | POA: Diagnosis not present

## 2021-03-01 DIAGNOSIS — Z955 Presence of coronary angioplasty implant and graft: Secondary | ICD-10-CM | POA: Insufficient documentation

## 2021-03-01 DIAGNOSIS — R42 Dizziness and giddiness: Secondary | ICD-10-CM | POA: Insufficient documentation

## 2021-03-01 DIAGNOSIS — Z79899 Other long term (current) drug therapy: Secondary | ICD-10-CM | POA: Insufficient documentation

## 2021-03-01 DIAGNOSIS — M7989 Other specified soft tissue disorders: Secondary | ICD-10-CM

## 2021-03-01 DIAGNOSIS — N183 Chronic kidney disease, stage 3 unspecified: Secondary | ICD-10-CM | POA: Diagnosis not present

## 2021-03-01 DIAGNOSIS — I2510 Atherosclerotic heart disease of native coronary artery without angina pectoris: Secondary | ICD-10-CM | POA: Insufficient documentation

## 2021-03-01 DIAGNOSIS — Z7902 Long term (current) use of antithrombotics/antiplatelets: Secondary | ICD-10-CM | POA: Insufficient documentation

## 2021-03-01 DIAGNOSIS — I129 Hypertensive chronic kidney disease with stage 1 through stage 4 chronic kidney disease, or unspecified chronic kidney disease: Secondary | ICD-10-CM | POA: Diagnosis not present

## 2021-03-01 DIAGNOSIS — R202 Paresthesia of skin: Secondary | ICD-10-CM | POA: Insufficient documentation

## 2021-03-01 DIAGNOSIS — Z85828 Personal history of other malignant neoplasm of skin: Secondary | ICD-10-CM | POA: Insufficient documentation

## 2021-03-01 LAB — CBC
HCT: 47.4 % (ref 39.0–52.0)
Hemoglobin: 15.9 g/dL (ref 13.0–17.0)
MCH: 30.8 pg (ref 26.0–34.0)
MCHC: 33.5 g/dL (ref 30.0–36.0)
MCV: 91.7 fL (ref 80.0–100.0)
Platelets: 171 10*3/uL (ref 150–400)
RBC: 5.17 MIL/uL (ref 4.22–5.81)
RDW: 12.8 % (ref 11.5–15.5)
WBC: 7.8 10*3/uL (ref 4.0–10.5)
nRBC: 0 % (ref 0.0–0.2)

## 2021-03-01 LAB — BASIC METABOLIC PANEL
Anion gap: 9 (ref 5–15)
BUN: 22 mg/dL (ref 8–23)
CO2: 25 mmol/L (ref 22–32)
Calcium: 9.5 mg/dL (ref 8.9–10.3)
Chloride: 106 mmol/L (ref 98–111)
Creatinine, Ser: 1.35 mg/dL — ABNORMAL HIGH (ref 0.61–1.24)
GFR, Estimated: 56 mL/min — ABNORMAL LOW (ref >60)
Glucose, Bld: 114 mg/dL — ABNORMAL HIGH (ref 70–99)
Potassium: 4.2 mmol/L (ref 3.5–5.1)
Sodium: 140 mmol/L (ref 135–145)

## 2021-03-01 LAB — TROPONIN I (HIGH SENSITIVITY)
Troponin I (High Sensitivity): 14 ng/L (ref <18)
Troponin I (High Sensitivity): 12 ng/L (ref <18)

## 2021-03-01 NOTE — ED Triage Notes (Signed)
Patient complains of chest pressure and weakness for 2 days. States that he is to have a ICD/pacemaker placed on 4/7 but felt worse today and directed to ED. Complains of SOB with same. Alert and oriented

## 2021-03-01 NOTE — ED Provider Notes (Signed)
[facility] EMERGENCY DEPARTMENT Provider Note   CSN: [id number] Arrival date & time: 03/01/21  1327     History Chief Complaint  Patient presents with  . Chest Pain    Brandon Gonzales is a 70 y.o. male with PMHx HTN, Dyslipidemia, CAD, Hypertrophic cardiomyopathy, MI, Stroke who presents to the ED today with complaint of sudden onset, diffuse, chest tightness that began around 10 AM this morning.  Patient also complains of a feeling of lightheadedness, perioral numbness/tingling, tingling in his right calf.  The symptoms lasted approximately 30 seconds before dissipating.  Patient reports he came to the ED for further evaluation given he is scheduled to have an ICD placed 04/07 by Dr. Lovena Le.  He reports he is unsure why he is getting the ICD placed but does report that he has had issues with his heart skipping beats and stopping.  Per chart review patient has a history of hypertrophic cardiomyopathy and it was recommended to have an ICD insertion for primary prevention of sudden death with a history of near syncope.   The history is provided by the patient and medical records.       Past Medical History:  Diagnosis Date  . Arthritis   . Cancer (HCC)    SKIN CA  SQUAMOUS CELL  . Coronary artery disease    a. 2007 Stent to LAD, PTCA D1;  b. DES to LAD & LCx 10/13 in the setting of STEMI.  [name] Kitchen Dyslipidemia    a. Statin and zetia-intolerant. On niacin.  [name] Kitchen GERD (gastroesophageal reflux disease)   . Heart murmur   . Hyperlipidemia    Phreesia 11/22/2020  . Hypertension   . Hypertrophic cardiomyopathy ([place])    a. 09/2012 Echo: EF 55%, mid to apical anterolateral, posterior, apical anterior, apical HK, Gr1 DD, turbulence across LVOT suggesting degree of obstruction, mild MR, mildly dil LA.  [name] Kitchen Myocardial infarction ([place])    Phreesia 11/22/2020  . Stroke [facility])    Phreesia 11/22/2020    Patient Active Problem List   Diagnosis Date Noted  . Acute ischemic stroke  ([place]) 09/15/2020  . Near syncope 12/17/2019  . Statin myopathy 11/13/2019  . Herpes labialis 01/31/2018  . Essential hypertension   . Heart murmur   . GERD (gastroesophageal reflux disease)   . CAD S/P percutaneous coronary angioplasty   . Cancer ([place])   . Angina at rest [facility]) 05/17/2017  . Hypertrophic cardiomyopathy ([place])   . S/P PTCA (percutaneous transluminal coronary angioplasty)   . Unstable angina ([place])   . Positional vertigo 04/24/2016  . NSVT (nonsustained ventricular tachycardia) ([place]) 09/25/2012  . Hypertrophic obstructive cardiomyopathy(425.11) 09/25/2012  . CKD (chronic kidney disease) stage 3, GFR 30-59 ml/min (HCC) 09/25/2012  . ST elevation myocardial infarction (STEMI) of lateral wall ([place]) 09/22/2012  . CAD (coronary artery disease) 01/11/2012  . Dyslipidemia   . H/O gastroesophageal reflux (GERD)   . Arthritis     Past Surgical History:  Procedure Laterality Date  . CARDIAC CATHETERIZATION  2007   stent to LAD and PTCA of diagonal branch  . CARDIAC CATHETERIZATION  2010  . CORONARY ANGIOPLASTY WITH STENT PLACEMENT  2013   95-99% prox LAD, patent LAD stent distal to this, occluded mid-dist LCx, mild <10% RCA irreg; s/p DES-prox LAD & DES to mid-dist LCx  . CORONARY BALLOON ANGIOPLASTY N/A 05/16/2017   Procedure: Coronary Balloon Angioplasty;  Surgeon: Leonie Man, MD;  Location: [facility] CV LAB;  Service: Cardiovascular;  Laterality: N/A;  .  [place]   right  . [place]   left  . LEFT HEART CATH Bilateral 09/22/2012   Procedure: LEFT HEART CATH;  Surgeon: Peter M Martinique, MD;  Location: [facility] CATH LAB;  Service: Cardiovascular;  Laterality: Bilateral;  . LEFT HEART CATH AND CORONARY ANGIOGRAPHY N/A 05/16/2017   Procedure: Left Heart Cath and Coronary Angiography;  Surgeon: Leonie Man, MD;  Location: [facility] CV LAB;  Service: Cardiovascular;  Laterality: N/A;  . PERCUTANEOUS CORONARY STENT INTERVENTION (PCI-S) Right 09/22/2012    Procedure: PERCUTANEOUS CORONARY STENT INTERVENTION (PCI-S);  Surgeon: Peter M Martinique, MD;  Location: [facility] CATH LAB;  Service: Cardiovascular;  Laterality: Right;       Family History  Problem Relation Age of Onset  . Heart attack Mother 74  . Heart disease Mother     Social History   Tobacco Use  . Smoking status: Never Smoker  . Smokeless tobacco: Never Used  Vaping Use  . Vaping Use: Never used  Substance Use Topics  . Alcohol use: No  . Drug use: No    Home Medications Prior to Admission medications   Medication Sig Start Date End Date Taking? Authorizing Provider  amLODipine (NORVASC) 2.5 MG tablet TAKE 1 TABLET BY MOUTH EVERY DAY Patient taking differently: Take 2.5 mg by mouth daily. 12/06/20   Martinique, Peter M, MD  betamethasone dipropionate 0.05 % cream Apply 1 application topically daily as needed (tick bite). 11/17/20   [provider]  carvedilol (COREG) 12.5 MG tablet TAKE 0.5 TABLETS (6.25 MG TOTAL) BY MOUTH 2 (TWO) TIMES DAILY. 10/01/20   Martinique, Peter M, MD  clopidogrel (PLAVIX) 75 MG tablet Take 1 tablet (75 mg total) by mouth daily. 11/02/20   Martinique, Peter M, MD  esomeprazole (NEXIUM) 20 MG capsule Take 20 mg by mouth every morning.    [provider]  Inositol Niacinate 500 MG CAPS Take 1,000 mg by mouth daily.    [provider]  lisinopril (ZESTRIL) 40 MG tablet TAKE 1 TABLET BY MOUTH EVERY DAY Patient taking differently: Take 40 mg by mouth daily. 01/17/21   Martinique, Peter M, MD  nitroGLYCERIN (NITROSTAT) 0.4 MG SL tablet Place 0.4 mg under the tongue every 5 (five) minutes as needed for chest pain.    [provider]  valACYclovir (VALTREX) 500 MG tablet Take 1 tablet (500 mg total) by mouth daily as needed (fever blisters). Patient taking differently: Take 500 mg by mouth daily. 11/12/20   Nicolette Bang, DO    Allergies    Antihistamines, chlorpheniramine-type; Pheniramine; Statins; and Zetia  [ezetimibe]  Review of Systems   Review of Systems  Constitutional: Negative for chills, diaphoresis and fever.  Respiratory: Negative for shortness of breath.   Cardiovascular: Positive for chest pain.  Gastrointestinal: Negative for nausea and vomiting.  Neurological: Positive for light-headedness and numbness.  All other systems reviewed and are negative.   Physical Exam Updated Vital Signs BP (!) 164/104   Pulse 61   Temp (!) 97.5 F (36.4 C) (Oral)   Resp 14   Ht 5\' 8"  (1.727 m)   Wt 81.6 kg   SpO2 99%   BMI 27.34 kg/m   Physical Exam Vitals and nursing note reviewed.  Constitutional:      Appearance: He is not ill-appearing or diaphoretic.  HENT:     Head: Normocephalic and atraumatic.  Eyes:     Conjunctiva/sclera: Conjunctivae normal.  Cardiovascular:     Rate and  Rhythm: Normal rate and regular rhythm.     Pulses:          Radial pulses are 2+ on the right side and 2+ on the left side.       Posterior tibial pulses are 2+ on the right side and 2+ on the left side.  Pulmonary:     Effort: Pulmonary effort is normal.     Breath sounds: Normal breath sounds. No decreased breath sounds, wheezing, rhonchi or rales.  Chest:     Chest wall: No tenderness.  Abdominal:     Palpations: Abdomen is soft.     Tenderness: There is no abdominal tenderness. There is no guarding or rebound.  Musculoskeletal:     Cervical back: Neck supple.     Right lower leg: Tenderness present. No edema.     Left lower leg: No tenderness. No edema.  Skin:    General: Skin is warm and dry.  Neurological:     Mental Status: He is alert.     ED Results / Procedures / Treatments   Labs (all labs ordered are listed, but only abnormal results are displayed) Labs Reviewed  BASIC METABOLIC PANEL - Abnormal; Notable for the following components:      Result Value   Glucose, Bld 114 (*)    Creatinine, Ser 1.35 (*)    GFR, Estimated 56 (*)    All other components within normal limits   CBC  TROPONIN I (HIGH SENSITIVITY)  TROPONIN I (HIGH SENSITIVITY)    EKG None  Radiology DG Chest 2 View  Result Date: 03/01/2021 CLINICAL DATA:  Chest pain EXAM: CHEST - 2 VIEW COMPARISON:  09/15/2020 chest radiograph. FINDINGS: Stable cardiomediastinal silhouette with normal heart size. No pneumothorax. No pleural effusion. Lungs appear clear, with no acute consolidative airspace disease and no pulmonary edema. IMPRESSION: No active cardiopulmonary disease. Electronically Signed   By: Ilona Sorrel M.D.   On: 03/01/2021 14:30   VAS [place] LOWER EXTREMITY VENOUS (DVT) (ONLY [facility] & [facility] 7a-7p)  Result Date: 03/01/2021  Lower Venous DVT Study Indications: Swelling.  Comparison Study: no prior Performing Technologist: Abram Sander RVS  Examination Guidelines: A complete evaluation includes B-mode imaging, spectral Doppler, color Doppler, and power Doppler as needed of all accessible portions of each vessel. Bilateral testing is considered an integral part of a complete examination. Limited examinations for reoccurring indications may be performed as noted. The reflux portion of the exam is performed with the patient in reverse Trendelenburg.  +---------+---------------+---------+-----------+----------+--------------+ RIGHT    CompressibilityPhasicitySpontaneityPropertiesThrombus Aging +---------+---------------+---------+-----------+----------+--------------+ CFV      Full           Yes      Yes                                 +---------+---------------+---------+-----------+----------+--------------+ SFJ      Full                                                        +---------+---------------+---------+-----------+----------+--------------+ FV Prox  Full                                                        +---------+---------------+---------+-----------+----------+--------------+  FV Mid   Full                                                         +---------+---------------+---------+-----------+----------+--------------+ FV DistalFull                                                        +---------+---------------+---------+-----------+----------+--------------+ PFV      Full                                                        +---------+---------------+---------+-----------+----------+--------------+ POP      Full           Yes      Yes                                 +---------+---------------+---------+-----------+----------+--------------+ PTV      Full                                                        +---------+---------------+---------+-----------+----------+--------------+ PERO     Full                                                        +---------+---------------+---------+-----------+----------+--------------+   +----+---------------+---------+-----------+----------+--------------+ LEFTCompressibilityPhasicitySpontaneityPropertiesThrombus Aging +----+---------------+---------+-----------+----------+--------------+ CFV Full           Yes      Yes                                 +----+---------------+---------+-----------+----------+--------------+     Summary: RIGHT: - There is no evidence of deep vein thrombosis in the lower extremity.  - No cystic structure found in the popliteal fossa.  LEFT: - No evidence of common femoral vein obstruction.  *See table(s) above for measurements and observations.    Preliminary     Procedures Procedures   Medications Ordered in ED Medications - No data to display  ED Course  I have reviewed the triage vital signs and the nursing notes.  Pertinent labs & imaging results that were available during my care of the patient were reviewed by me and considered in my medical decision making (see chart for details).    MDM Rules/Calculators/A&P                          70 year old male presents to the ED today with complaint of sudden onset of  diffuse chest tightness, lightheadedness, perioral tingling as well as tingling in his RLE that lasted approximately 30 seconds and  then dissipated.  Patient has scheduled ICD placement on 0407 by Dr. Drue Stager secondary to hypertrophic cardiomyopathy with history of syncope.  It does appear he had history of non-SVT as well.  Patient had assessment patient was feeling today.  On arrival to the ED he did have an EKG which does show right bundle branch block which is known.  No other acute ischemic changes and appears unchanged from previous tracing.  Chest x-ray clear.  Initial troponin of 14.  CBC and BMP without electrolyte abnormalities.  Creatinine stable at 1.35.  On exam patient appears to be in no acute distress.  He denies any additional episodes.  He denies heart palpitations.  He does report that he has had episodes of skipping beats which is why he thinks he is having the ICD placed.  He did not feel his heart skipping beats at this time.  He does have some tenderness palpation to the right calf.  He is currently on Plavix for A. Fib.  He denies any risk factors for DVT at this time however given the tingling sensation in his calf as well as pain will plan for DVT study.  If positive may consider getting a CTA to rule out PE however patient is nontachycardic and nonhypoxic.  Will likely plan to touch base with cardiology given his history.  DVT study negative.  Repeat troponin of 12.  Discussed case with cardiologist Dr. Meda Coffee who evaluated patient's EKG as well as work-up today.  Given the episode only lasted 30 seconds and sounds very atypical does not feel patient requires admission at this time.  It is recommended that he have close follow-up with his cardiologist and he needs to call if episodes become more recurrent.  Attending physician Dr. Sabra Heck has evaluated patient as well and agrees with plan.   This note was prepared using Dragon voice recognition software and may include  unintentional dictation errors due to the inherent limitations of voice recognition software.  Final Clinical Impression(s) / ED Diagnoses Final diagnoses:  Nonspecific chest pain    Rx / DC Orders ED Discharge Orders    None       Discharge Instructions     Your workup was very reassuring today. It is recommended that you follow up with Dr. Martinique your cardiologist given your episode today/ED visit. It is also recommended that you call them to see them sooner if these episodes become more recurrent.   Return to the ED for any worsening symptoms       Eustaquio Maize, Hershal Coria 03/01/21 1754    Noemi Chapel, MD 03/05/21 0730

## 2021-03-01 NOTE — Discharge Instructions (Addendum)
Your workup was very reassuring today. It is recommended that you follow up with Dr. Martinique your cardiologist given your episode today/ED visit. It is also recommended that you call them to see them sooner if these episodes become more recurrent.   Return to the ED for any worsening symptoms

## 2021-03-01 NOTE — ED Provider Notes (Signed)
To have pacer placed in next 2 weeks Started having chest tightness with light headedness HOCM history. Had some tingling in his mouth bilaterally - simlar in R calf Lasted 30 seconds and then went away - none since Has some residual ttp in the R calf, Wanted to be rechecked prior to procedure.  Trop OK, known RBBB, no other findings at this time. DVT study is negative.  Medical screening examination/treatment/procedure(s) were conducted as a shared visit with non-physician practitioner(s) and myself.  I personally evaluated the patient during the encounter.  Clinical Impression:   Final diagnoses:  Nonspecific chest pain         Noemi Chapel, MD 03/05/21 0730

## 2021-03-01 NOTE — Progress Notes (Signed)
Lower extremity venous has been completed.   Preliminary results in CV Proc.   Abram Sander 03/01/2021 4:47 PM

## 2021-03-05 ENCOUNTER — Telehealth: Payer: Self-pay | Admitting: Internal Medicine

## 2021-03-05 NOTE — Telephone Encounter (Signed)
Called by Mr. Brandon Gonzales because he reported feeling like his heart was "stopping" multiple times / minute, followed by very forceful beats that he could feel in his chest thereafter. He has not had any dizziness, lightheadedness, or syncope. He has occasionally had the same vague chest discomfort he recently had evaluated in the emergency department. He notes this discomfort is markedly different then the discomfort he had prior to previous stents. I had him check his HR/BP using his home device while we were in the phone. His HR was in the 70s with a blood pressure of ~190/100 (this is highly elevated for him). He admittedly was quite anxious and understandably concerned, especially in regards to his upcoming defibrillator. While we were talking on the phone, he noted significant relief. I advised Mr. Brandon Gonzales that his HR was reassuring. I asked that he recheck his blood pressure in a few hours. I also asked Mr. Brandon Gonzales to report to the emergency department if his symptoms did not improve in the next hour or two or his symptoms evolved in any significant fashion.

## 2021-03-08 ENCOUNTER — Other Ambulatory Visit (HOSPITAL_COMMUNITY)
Admission: RE | Admit: 2021-03-08 | Discharge: 2021-03-08 | Disposition: A | Discharge status: Home / Self Care | Payer: Medicare HMO | Source: Ambulatory Visit | Attending: Internal Medicine | Admitting: Internal Medicine

## 2021-03-08 DIAGNOSIS — Z20822 Contact with and (suspected) exposure to covid-19: Secondary | ICD-10-CM | POA: Diagnosis not present

## 2021-03-08 DIAGNOSIS — Z01812 Encounter for preprocedural laboratory examination: Secondary | ICD-10-CM | POA: Diagnosis not present

## 2021-03-08 LAB — SARS CORONAVIRUS 2 (TAT 6-24 HRS): SARS Coronavirus 2: NEGATIVE

## 2021-03-09 NOTE — Pre-Procedure Instructions (Signed)
Instructed patient on the following items: Arrival time 0730 Nothing to eat or drink after midnight Can take  AM meds day of procedure Responsible person to drive you home and stay with you for 24 hrs Wash with special soap night before and morning of procedure If on anti-coagulant drug instructions Plavix- last dose was 4/4

## 2021-03-10 ENCOUNTER — Encounter (HOSPITAL_COMMUNITY)
Admission: RE | Disposition: A | Discharge status: Home / Self Care | Payer: No Typology Code available for payment source | Source: Home / Self Care | Attending: Internal Medicine

## 2021-03-10 ENCOUNTER — Ambulatory Visit (HOSPITAL_COMMUNITY): Payer: Medicare HMO

## 2021-03-10 ENCOUNTER — Ambulatory Visit (HOSPITAL_COMMUNITY)
Admission: RE | Admit: 2021-03-10 | Discharge: 2021-03-10 | Disposition: A | Discharge status: Home / Self Care | Payer: Medicare HMO | Attending: Internal Medicine | Admitting: Internal Medicine

## 2021-03-10 DIAGNOSIS — I110 Hypertensive heart disease with heart failure: Secondary | ICD-10-CM | POA: Diagnosis not present

## 2021-03-10 DIAGNOSIS — I421 Obstructive hypertrophic cardiomyopathy: Secondary | ICD-10-CM | POA: Diagnosis not present

## 2021-03-10 DIAGNOSIS — I422 Other hypertrophic cardiomyopathy: Secondary | ICD-10-CM | POA: Diagnosis not present

## 2021-03-10 DIAGNOSIS — Z8249 Family history of ischemic heart disease and other diseases of the circulatory system: Secondary | ICD-10-CM | POA: Insufficient documentation

## 2021-03-10 DIAGNOSIS — I495 Sick sinus syndrome: Secondary | ICD-10-CM | POA: Diagnosis not present

## 2021-03-10 DIAGNOSIS — I509 Heart failure, unspecified: Secondary | ICD-10-CM | POA: Diagnosis not present

## 2021-03-10 DIAGNOSIS — Z79899 Other long term (current) drug therapy: Secondary | ICD-10-CM | POA: Diagnosis not present

## 2021-03-10 DIAGNOSIS — Z7902 Long term (current) use of antithrombotics/antiplatelets: Secondary | ICD-10-CM | POA: Diagnosis not present

## 2021-03-10 DIAGNOSIS — I472 Ventricular tachycardia: Secondary | ICD-10-CM | POA: Diagnosis not present

## 2021-03-10 DIAGNOSIS — Z888 Allergy status to other drugs, medicaments and biological substances status: Secondary | ICD-10-CM | POA: Insufficient documentation

## 2021-03-10 DIAGNOSIS — Z8673 Personal history of transient ischemic attack (TIA), and cerebral infarction without residual deficits: Secondary | ICD-10-CM | POA: Diagnosis not present

## 2021-03-10 DIAGNOSIS — Z95810 Presence of automatic (implantable) cardiac defibrillator: Secondary | ICD-10-CM

## 2021-03-10 DIAGNOSIS — I517 Cardiomegaly: Secondary | ICD-10-CM | POA: Diagnosis not present

## 2021-03-10 HISTORY — PX: ICD IMPLANT: EP1208

## 2021-03-10 SURGERY — ICD IMPLANT

## 2021-03-10 MED ORDER — MIDAZOLAM HCL 5 MG/5ML IJ SOLN
INTRAMUSCULAR | Status: AC
  Filled 2021-03-10: qty 5

## 2021-03-10 MED ORDER — ACETAMINOPHEN 325 MG PO TABS
325.0000 mg | ORAL_TABLET | ORAL | Status: DC | PRN
Start: 2021-03-10 — End: 2021-03-10
  Administered 2021-03-10: 650 mg via ORAL
  Filled 2021-03-10: qty 2

## 2021-03-10 MED ORDER — MIDAZOLAM HCL 5 MG/5ML IJ SOLN
INTRAMUSCULAR | Status: DC | PRN
  Administered 2021-03-10 (×3): 2 mg via INTRAVENOUS

## 2021-03-10 MED ORDER — CEFAZOLIN SODIUM-DEXTROSE 2-4 GM/100ML-% IV SOLN
2.0000 g | INTRAVENOUS | Status: AC
  Administered 2021-03-10: 2 g via INTRAVENOUS

## 2021-03-10 MED ORDER — HEPARIN (PORCINE) IN NACL 1000-0.9 UT/500ML-% IV SOLN
INTRAVENOUS | Status: AC
  Filled 2021-03-10: qty 500

## 2021-03-10 MED ORDER — CEFAZOLIN SODIUM-DEXTROSE 2-4 GM/100ML-% IV SOLN
INTRAVENOUS | Status: AC
  Filled 2021-03-10: qty 100

## 2021-03-10 MED ORDER — LIDOCAINE HCL (PF) 1 % IJ SOLN
INTRAMUSCULAR | Status: AC
  Filled 2021-03-10: qty 60

## 2021-03-10 MED ORDER — ONDANSETRON HCL 4 MG/2ML IJ SOLN: 4.0000 mg | Freq: Four times a day (QID) | INTRAMUSCULAR | Status: DC | PRN

## 2021-03-10 MED ORDER — CHLORHEXIDINE GLUCONATE 4 % EX LIQD: 4.0000 "application " | Freq: Once | CUTANEOUS | Status: DC

## 2021-03-10 MED ORDER — FENTANYL CITRATE (PF) 100 MCG/2ML IJ SOLN
INTRAMUSCULAR | Status: AC
  Filled 2021-03-10: qty 2

## 2021-03-10 MED ORDER — FENTANYL CITRATE (PF) 100 MCG/2ML IJ SOLN
INTRAMUSCULAR | Status: DC | PRN
  Administered 2021-03-10 (×2): 25 ug via INTRAVENOUS

## 2021-03-10 MED ORDER — POVIDONE-IODINE 10 % EX SWAB
2.0000 "application " | Freq: Once | CUTANEOUS | Status: AC
  Administered 2021-03-10: 2 via TOPICAL

## 2021-03-10 MED ORDER — HEPARIN (PORCINE) IN NACL 1000-0.9 UT/500ML-% IV SOLN
INTRAVENOUS | Status: DC | PRN
  Administered 2021-03-10: 500 mL

## 2021-03-10 MED ORDER — CEFAZOLIN SODIUM-DEXTROSE 1-4 GM-%(50ML) IV SOLR
1.0000 g | Freq: Once | INTRAVENOUS | Status: AC
  Administered 2021-03-10: 1 g via INTRAVENOUS
  Filled 2021-03-10: qty 50

## 2021-03-10 MED ORDER — SODIUM CHLORIDE 0.9 % IV SOLN
INTRAVENOUS | Status: AC
  Filled 2021-03-10: qty 2

## 2021-03-10 MED ORDER — SODIUM CHLORIDE 0.9 % IV SOLN
80.0000 mg | INTRAVENOUS | Status: AC
  Administered 2021-03-10: 80 mg

## 2021-03-10 MED ORDER — LIDOCAINE HCL (PF) 1 % IJ SOLN
INTRAMUSCULAR | Status: DC | PRN
  Administered 2021-03-10: 60 mL

## 2021-03-10 MED ORDER — SODIUM CHLORIDE 0.9 % IV SOLN: INTRAVENOUS | Status: DC

## 2021-03-10 SURGICAL SUPPLY — 8 items
CABLE SURGICAL S-101-97-12 (CABLE) ×2 IMPLANT
ICD GALLANT DR CDDRA500Q (ICD Generator) ×1 IMPLANT
LEAD DURATA 7122Q-65CM (Lead) ×1 IMPLANT
LEAD TENDRIL MRI 52CM LPA1200M (Lead) ×1 IMPLANT
PAD PRO RADIOLUCENT 2001M-C (PAD) ×2 IMPLANT
SHEATH 7FR PRELUDE SNAP 13 (SHEATH) ×1 IMPLANT
SHEATH 8FR PRELUDE SNAP 13 (SHEATH) ×1 IMPLANT
TRAY PACEMAKER INSERTION (PACKS) ×2 IMPLANT

## 2021-03-10 NOTE — H&P (Signed)
I have seen Brandon Gonzales is a 70 y.o. male in the office today who has been referred by Dr. Martinique for consideration of ICD implant for primary prevention of sudden death.  The patient's chart has been reviewed and they meet criteria for ICD implant.  I have had a thorough discussion with the patient reviewing options.  The patient and their family (if available) have had opportunities to ask questions and have them answered. The patient and I have decided together through a shared decision making process to proceed with ICD at this time.  Risks, benefits, alternatives to ICD implantation were discussed in detail with the patient today. The patient  understands that the risks include but are not limited to bleeding, infection, pneumothorax, perforation, tamponade, vascular damage, renal failure, MI, stroke, death, inappropriate shocks, and lead dislodgement and he wishes to proceed.  We will therefore schedule device implantation at the next available time.

## 2021-03-10 NOTE — Discharge Instructions (Signed)
After Your ICD (Implantable Cardiac Defibrillator)   . You have a [employer] Jude ICD  ACTIVITY . Do not lift your arm above shoulder height for 1 week after your procedure. After 7 days, you may progress as below.  . You should remove your sling 24 hours after your procedure, unless otherwise instructed by your provider.     Thursday March 17, 2021  Friday March 18, 2021 Saturday March 19, 2021 Sunday March 20, 2021   . Do not lift, push, pull, or carry anything over 10 pounds with the affected arm until 6 weeks (Thursday Apr 21, 2021 ) after your procedure.   . Do NOT DRIVE until you have been seen for your wound check, or as long as instructed by your healthcare provider.   . Ask your healthcare provider when you can go back to work   INCISION/Dressing . If you are on a blood thinner such as  Plavix, resume 4/12  . Monitor your defibrillator site for redness, swelling, and drainage. Call the device clinic at [phone] if you experience these symptoms or fever/chills.  [name] Kitchen REMOVE OUTER DRESSING IN 24 HOURS/ KEEP SITE DRY/ DO NOT REMOVE STERI STRIPS, THEY WILL FALL OFF IN 7-10 DAYS  . Avoid lotions, ointments, or perfumes over your incision until it is well-healed.  . You may use a hot tub or a pool AFTER your wound check appointment if the incision is completely closed.  . Your ICD is designed to protect you from life threatening heart rhythms. Because of this, you may receive a shock.   o 1 shock with no symptoms:  Call the office during business hours. o 1 shock with symptoms (chest pain, chest pressure, dizziness, lightheadedness, shortness of breath, overall feeling unwell):  Call 911. o If you experience 2 or more shocks in 24 hours:  Call 911. o If you receive a shock, you should not drive for 6 months per the [place] DMV IF you receive appropriate therapy from your ICD.   . ICD Alerts:  Some alerts are vibratory and others beep. These are NOT emergencies. Please call our office to  let us know. If this occurs at night or on weekends, it can wait until the next business day. Send a remote transmission.  . If your device is capable of reading fluid status (for heart failure), you will be offered monthly monitoring to review this with you.   DEVICE MANAGEMENT . Remote monitoring is used to monitor your ICD from home. This monitoring is scheduled every 91 days by our office. It allows [place] to keep an eye on the functioning of your device to ensure it is working properly. You will routinely see your Electrophysiologist annually (more often if necessary).   . You should receive your ID card for your new device in 4-8 weeks. Keep this card with you at all times once received. Consider wearing a medical alert bracelet or necklace.  . Your ICD  may be MRI compatible. This will be discussed at your next office visit/wound check.  You should avoid contact with strong electric or magnetic fields.    Do not use amateur (ham) radio equipment or electric (arc) welding torches. MP3 player headphones with magnets should not be used. Some devices are safe to use if held at least 12 inches (30 cm) from your defibrillator. These include power tools, lawn mowers, and speakers. If you are unsure if something is safe to use, ask your health care provider.   When using  your cell phone, hold it to the ear that is on the opposite side from the defibrillator. Do not leave your cell phone in a pocket over the defibrillator.   You may safely use electric blankets, heating pads, computers, and microwave ovens.  Call the office right away if:  You have chest pain.  You feel more than one shock.  You feel more short of breath than you have felt before.  You feel more light-headed than you have felt before.  Your incision starts to open up.  This information is not intended to replace advice given to you by your health care provider. Make sure you discuss any questions you have with your health  care provider.

## 2021-03-10 NOTE — Progress Notes (Signed)
Discharge instructions reviewed with pt and his wife both voice understanding.

## 2021-03-10 NOTE — Interval H&P Note (Signed)
History and Physical Interval Note:  03/10/2021 9:34 AM  Brandon Gonzales  has presented today for surgery, with the diagnosis of cardiomyopathy.  The various methods of treatment have been discussed with the patient and family. After consideration of risks, benefits and other options for treatment, the patient has consented to  Procedure(s): ICD IMPLANT (N/A) as a surgical intervention.  The patient's history has been reviewed, patient examined, no change in status, stable for surgery.  I have reviewed the patient's chart and labs.  Questions were answered to the patient's satisfaction.     Brandon Gonzales

## 2021-03-10 NOTE — Progress Notes (Signed)
Dr Lovena Le in states ok to d/c pt home

## 2021-03-11 ENCOUNTER — Ambulatory Visit (INDEPENDENT_AMBULATORY_CARE_PROVIDER_SITE_OTHER): Payer: Medicare HMO | Admitting: Emergency Medicine

## 2021-03-11 ENCOUNTER — Ambulatory Visit: Payer: Medicare HMO | Admitting: Emergency Medicine

## 2021-03-11 ENCOUNTER — Telehealth: Payer: Self-pay | Admitting: Emergency Medicine

## 2021-03-11 ENCOUNTER — Encounter (HOSPITAL_COMMUNITY): Payer: Self-pay | Admitting: Internal Medicine

## 2021-03-11 ENCOUNTER — Telehealth: Payer: Self-pay

## 2021-03-11 ENCOUNTER — Other Ambulatory Visit: Payer: Self-pay

## 2021-03-11 DIAGNOSIS — I422 Other hypertrophic cardiomyopathy: Secondary | ICD-10-CM

## 2021-03-11 DIAGNOSIS — Z95810 Presence of automatic (implantable) cardiac defibrillator: Secondary | ICD-10-CM

## 2021-03-11 NOTE — Patient Instructions (Signed)
Apply ice pack to wound site for 20 minutes at a time 4 times a day. Take Tylenol 2 tablets every 6-8 hours for pain. Call the office if you have any increased swelling, drainage, or bleeding from wound site. Follow-up for wound check on 03/15/21 at 9 am. Do not take any Plavix or aspirin until after follow-up appointment Tuesday.  Call if you have any questions or concerns. Device Clinic: [phone] After Hours: [phone]

## 2021-03-11 NOTE — Telephone Encounter (Signed)
The patient left a voicemail stating his implant site has swelled up to half the size of a baseball. He got an ice pack on it. He was seen this morning. He would like for the nurse to call him back at [phone].

## 2021-03-11 NOTE — Progress Notes (Signed)
Wound with small amount of edema distal to incision site. Bruising  noted in area of edema. Pressure held over wound site for 5 minutes and pressure dressing applied . Patient to return 412/22 for wound check at 0900. Education done on s/sx of infection and increased edema. Patient provided with device clinic number and on-call number for any questions or concerns.

## 2021-03-11 NOTE — Patient Instructions (Addendum)
Call the office if you have increased swelling, or any drainage or bleeding from wound site. Do not shower and keep steri-strips dry and clean until wound check 03/22/21. Restart your Plavix on Tuesday 03/15/21 and if you devlop swelling at the wound site stop the Plavix and call the office.  [facility] Clinic: [phone] After 5 pm or on the weekends: [phone]

## 2021-03-11 NOTE — Telephone Encounter (Signed)
Patient reports wound site has edema and looks like a "baseball". Per Dr Lovena Le patient to come in for pressure dressing and hold Plavix until he has follow-u appointment 03/15/21. Patient agrres to come in ASAP for pressure dressing.

## 2021-03-11 NOTE — Progress Notes (Signed)
Pressure dressing removed from wound site. Wound site with bruising and small amount of edema. Steri-strips dry and intact with small amount of dried blood visible on steri-strips. No drainage or bleeding from wound site. Dr Lovena Le in to assess wound site. Patient to hold Plavix until Tuesday , 03/15/21. Education done on s/sx of infection and to hold Plavix and call the office if edema increases at wound site. Lifting restrictions and ROM restrictions reviewed with patient and family.

## 2021-03-11 NOTE — Telephone Encounter (Signed)
Patient was seen in clinic today for same day discharge. See Epic notes. Device paired with monitor.

## 2021-03-15 ENCOUNTER — Other Ambulatory Visit: Payer: Self-pay

## 2021-03-15 ENCOUNTER — Ambulatory Visit (INDEPENDENT_AMBULATORY_CARE_PROVIDER_SITE_OTHER): Payer: Medicare HMO | Admitting: Emergency Medicine

## 2021-03-15 DIAGNOSIS — I422 Other hypertrophic cardiomyopathy: Secondary | ICD-10-CM

## 2021-03-15 NOTE — Progress Notes (Signed)
Patient in office for wound recheck/ removal of pressure dressing applied 03/11/21.  Device implanted on 4/7 with reported hematoma developing after D/C.    Pressure dressing removed today, mild swelling noted at lower aspect of impant site.  Bruising also present toward axillary line.  Pt denies any tenderness.    Pt educated to monitor and note if swelling increases again.  Continue with ice to lower aspect of site 20 minutes atr a time 3-4x per day.  Pt scheduled for Wound and device check on 03/22/21.

## 2021-03-15 NOTE — Patient Instructions (Signed)
Continue with ice 20 minutes at a time 3-4 times per day.

## 2021-03-16 ENCOUNTER — Ambulatory Visit: Payer: Medicare HMO | Admitting: General Practice

## 2021-03-17 ENCOUNTER — Other Ambulatory Visit: Payer: Self-pay

## 2021-03-17 ENCOUNTER — Telehealth: Payer: Self-pay

## 2021-03-17 ENCOUNTER — Ambulatory Visit (INDEPENDENT_AMBULATORY_CARE_PROVIDER_SITE_OTHER): Payer: Medicare HMO | Admitting: Emergency Medicine

## 2021-03-17 DIAGNOSIS — I495 Sick sinus syndrome: Secondary | ICD-10-CM

## 2021-03-17 NOTE — Progress Notes (Signed)
Wound site with steri-strips in place. Steri-strips dry and intact with no drainage or bleeding visible. Bruising noted around steri-strips and below distal edge of steri-strips. Raised area above steri-strips is the outer edge of the device. Dr Lovena Le in to assess wound site. Per Dr Lovena Le patient is to hold Plavix until after wound check on 03/22/21.

## 2021-03-17 NOTE — Telephone Encounter (Signed)
Patient scheduled top be seen in [facility] today to see if he needs another pressure dressing. Patient told to arrive at 4:30 PM.

## 2021-03-17 NOTE — Patient Instructions (Signed)
Do not take your Plavix until after you wound check appointment on 03/22/21.

## 2021-03-17 NOTE — Telephone Encounter (Signed)
Patient called in stating that his wound site is still giving him issues and he said its swollen and thinks he needs a new pressure dressing

## 2021-03-21 ENCOUNTER — Other Ambulatory Visit: Payer: Self-pay | Admitting: Internal Medicine

## 2021-03-21 DIAGNOSIS — B001 Herpesviral vesicular dermatitis: Secondary | ICD-10-CM

## 2021-03-21 NOTE — Telephone Encounter (Signed)
Pt would like his valACYclovir (VALTREX) 500 MG tablet [[id number]]  Refilled. Pt set up a virtual appt with provider but the only available appt is May 3rd at 11:10a. He states that he has no more pills left. Please follow up with pt and let him know his next steps.

## 2021-03-22 ENCOUNTER — Ambulatory Visit (INDEPENDENT_AMBULATORY_CARE_PROVIDER_SITE_OTHER): Payer: Medicare HMO | Admitting: Emergency Medicine

## 2021-03-22 ENCOUNTER — Other Ambulatory Visit: Payer: Self-pay

## 2021-03-22 ENCOUNTER — Telehealth: Payer: Self-pay | Admitting: Internal Medicine

## 2021-03-22 DIAGNOSIS — I472 Ventricular tachycardia: Secondary | ICD-10-CM

## 2021-03-22 DIAGNOSIS — I4729 Other ventricular tachycardia: Secondary | ICD-10-CM

## 2021-03-22 DIAGNOSIS — B001 Herpesviral vesicular dermatitis: Secondary | ICD-10-CM

## 2021-03-22 MED ORDER — VALACYCLOVIR HCL 500 MG PO TABS: 500.0000 mg | ORAL_TABLET | Freq: Every day | ORAL | 0 refills | Status: DC | PRN

## 2021-03-22 NOTE — Progress Notes (Signed)
Valacyclovir resent to [facility]

## 2021-03-22 NOTE — Patient Instructions (Signed)
Restart Plavix today.

## 2021-03-22 NOTE — Telephone Encounter (Signed)
Med resent to correct pharmacy

## 2021-03-22 NOTE — Telephone Encounter (Signed)
Pt states that valACYclovir (VALTREX) 500 MG tablet [[id number]] was sent to the wrong pharmacy. Pt would like provider moving forward to send this medicine to [facility], [place], [place], [place] [address]. I informed pt that [facility] to [facility] for him to be able to get his med quicker due to his provider being out of the office. Please follow up with pt.

## 2021-03-22 NOTE — Progress Notes (Signed)
  Wound check appointment. Steri-strips removed. Wound without redness or edema. Incision edges approximated, wound well healed. Normal device function. Thresholds, sensing, and impedances consistent with implant measurements. Device programmed at 3.5V for extra safety margin until 3 month visit. Histogram distribution appropriate for patient and level of activity. No mode switches or ventricular arrhythmias noted. Patient educated about wound care, arm mobility, lifting restrictions, shock plan. Patient enrolled in remote monitoring with next transmission 06/10/21. 91 days follow up with Dr. Lovena Le 06/21/21. Patient instructed to restart plavix per Dr. Lovena Le. Hematoma at implant site resolved.

## 2021-03-23 LAB — CUP PACEART INCLINIC DEVICE CHECK
Battery Remaining Longevity: 92 mo
Brady Statistic RA Percent Paced: 53 %
Brady Statistic RV Percent Paced: 0.26 %
Date Time Interrogation Session: 20220419131830
HighPow Impedance: 59.625
Implantable Lead Implant Date: 20220407
Implantable Lead Implant Date: 20220407
Implantable Lead Location: 753859
Implantable Lead Location: 753860
Implantable Lead Model: 7122
Implantable Pulse Generator Implant Date: 20220407
Lead Channel Impedance Value: 462.5 Ohm
Lead Channel Impedance Value: 500 Ohm
Lead Channel Pacing Threshold Amplitude: 0.5 V
Lead Channel Pacing Threshold Amplitude: 0.75 V
Lead Channel Pacing Threshold Pulse Width: 0.5 ms
Lead Channel Pacing Threshold Pulse Width: 0.5 ms
Lead Channel Sensing Intrinsic Amplitude: 11.8 mV
Lead Channel Sensing Intrinsic Amplitude: 2.1 mV
Lead Channel Setting Pacing Amplitude: 3.5 V
Lead Channel Setting Pacing Amplitude: 3.5 V
Lead Channel Setting Pacing Pulse Width: 0.5 ms
Lead Channel Setting Sensing Sensitivity: 0.5 mV
Pulse Gen Serial Number: 810023633

## 2021-04-05 ENCOUNTER — Telehealth: Payer: Medicare HMO | Admitting: Internal Medicine

## 2021-04-07 ENCOUNTER — Other Ambulatory Visit: Payer: Self-pay

## 2021-04-07 ENCOUNTER — Telehealth (INDEPENDENT_AMBULATORY_CARE_PROVIDER_SITE_OTHER): Payer: Medicare HMO | Admitting: Internal Medicine

## 2021-04-07 DIAGNOSIS — B001 Herpesviral vesicular dermatitis: Secondary | ICD-10-CM | POA: Diagnosis not present

## 2021-04-07 DIAGNOSIS — E785 Hyperlipidemia, unspecified: Secondary | ICD-10-CM

## 2021-04-07 MED ORDER — VALACYCLOVIR HCL 500 MG PO TABS: 500.0000 mg | ORAL_TABLET | Freq: Every day | ORAL | 3 refills | Status: DC | PRN

## 2021-04-07 NOTE — Progress Notes (Signed)
Virtual Visit via Telephone Note  I connected with Brandon Gonzales, on 04/07/2021 at 9:29 AM by telephone due to the COVID-19 pandemic and verified that I am speaking with the correct person using two identifiers.   Consent: I discussed the limitations, risks, security and privacy concerns of performing an evaluation and management service by telephone and the availability of in person appointments. I also discussed with the patient that there may be a patient responsible charge related to this service. The patient expressed understanding and agreed to proceed.   Location of Patient: Home   Location of Provider: Clinic    Persons participating in Telemedicine visit: Brandon Gonzales Nancy Nordmann  Dr. Juleen China   History of Present Illness: Patient has a visit for follow up. Reports that he needed an appointment to get a refill on Valtrex. Take Valtrex once per day; otherwise reports he will break out "like crazy" with cold sores.    Past Medical History:  Diagnosis Date  . Arthritis   . Cancer (HCC)    SKIN CA  SQUAMOUS CELL  . Coronary artery disease    a. 2007 Stent to LAD, PTCA D1;  b. DES to LAD & LCx 10/13 in the setting of STEMI.  [name] Kitchen Dyslipidemia    a. Statin and zetia-intolerant. On niacin.  [name] Kitchen GERD (gastroesophageal reflux disease)   . Heart murmur   . Hyperlipidemia    Phreesia 11/22/2020  . Hypertension   . Hypertrophic cardiomyopathy ([place])    a. 09/2012 Echo: EF 55%, mid to apical anterolateral, posterior, apical anterior, apical HK, Gr1 DD, turbulence across LVOT suggesting degree of obstruction, mild MR, mildly dil LA.  [name] Kitchen Myocardial infarction ([place])    Phreesia 11/22/2020  . Stroke [facility])    Phreesia 11/22/2020   Allergies  Allergen Reactions  . Antihistamines, Chlorpheniramine-Type Other (See Comments)    Altered mental status  . Pheniramine Other (See Comments)    Altered mental status  . Statins Other (See Comments)    Leg cramps    . Zetia  [Ezetimibe] Other (See Comments)    Leg cramps    Current Outpatient Medications on File Prior to Visit  Medication Sig Dispense Refill  . amLODipine (NORVASC) 2.5 MG tablet TAKE 1 TABLET BY MOUTH EVERY DAY (Patient taking differently: Take 2.5 mg by mouth daily.) 90 tablet 2  . betamethasone dipropionate 0.05 % cream Apply 1 application topically daily as needed (tick bite).    . carvedilol (COREG) 12.5 MG tablet TAKE 0.5 TABLETS (6.25 MG TOTAL) BY MOUTH 2 (TWO) TIMES DAILY. 90 tablet 3  . clopidogrel (PLAVIX) 75 MG tablet Take 1 tablet (75 mg total) by mouth daily. 90 tablet 3  . esomeprazole (NEXIUM) 20 MG capsule Take 20 mg by mouth every morning.    . Inositol Niacinate 500 MG CAPS Take 1,000 mg by mouth daily.    [name] Kitchen lisinopril (ZESTRIL) 40 MG tablet TAKE 1 TABLET BY MOUTH EVERY DAY (Patient taking differently: Take 40 mg by mouth daily.) 90 tablet 3  . nitroGLYCERIN (NITROSTAT) 0.4 MG SL tablet Place 0.4 mg under the tongue every 5 (five) minutes as needed for chest pain.    . valACYclovir (VALTREX) 500 MG tablet Take 1 tablet (500 mg total) by mouth daily as needed (fever blisters). 90 tablet 0   No current facility-administered medications on file prior to visit.    Observations/Objective: NAD. Speaking clearly.  Work of breathing normal.  Alert and oriented. Mood appropriate.   Assessment and  Plan: 1. Herpes labialis - valACYclovir (VALTREX) 500 MG tablet; Take 1 tablet (500 mg total) by mouth daily as needed (fever blisters).  Dispense: 90 tablet; Refill: 3  2. Hyperlipidemia, unspecified hyperlipidemia type Patient expresses concerns about his HLD. He is followed by cardiology. He has been intolerant to two statins and Zetia due to muscle cramps. We discussed potential of injectable cholesterol lowering medication and he reported that his cardiologist Dr. Martinique had mentioned it to him as well. He does not have f/u with cardiology until July. Will place referral to the lipid  clinic in the meantime.  - Ambulatory referral to Cardiology   Follow Up Instructions: 6 month f/u    I discussed the assessment and treatment plan with the patient. The patient was provided an opportunity to ask questions and all were answered. The patient agreed with the plan and demonstrated an understanding of the instructions.   The patient was advised to call back or seek an in-person evaluation if the symptoms worsen or if the condition fails to improve as anticipated.     I provided 9 minutes total of non-face-to-face time during this encounter including median intraservice time, reviewing previous notes, investigations, ordering medications, medical decision making, coordinating care and patient verbalized understanding at the end of the visit.    Phill Myron, D.O. Primary Care at [facility]  04/07/2021, 9:29 AM

## 2021-04-07 NOTE — Progress Notes (Signed)
F/u call  Valtrex refilled on 03/22/21

## 2021-04-08 DIAGNOSIS — M79671 Pain in right foot: Secondary | ICD-10-CM | POA: Diagnosis not present

## 2021-04-08 DIAGNOSIS — M7670 Peroneal tendinitis, unspecified leg: Secondary | ICD-10-CM | POA: Insufficient documentation

## 2021-04-08 DIAGNOSIS — M7671 Peroneal tendinitis, right leg: Secondary | ICD-10-CM | POA: Diagnosis not present

## 2021-04-21 DIAGNOSIS — M79671 Pain in right foot: Secondary | ICD-10-CM | POA: Diagnosis not present

## 2021-04-22 ENCOUNTER — Telehealth: Payer: Self-pay | Admitting: *Deleted

## 2021-04-22 NOTE — Telephone Encounter (Signed)
Just completed the end of study phone call/chart review for the CLEAR research study. We reviewed the changes of the most recent informed consent ([place] Version 8.1 [id number] Local [id number]) and the subject agreed. All concomitant medications were reviewed and updated in EDC/source if applicable. There are no new AE's or SAE's to report to sponsor at this time. This subject was being followed via telephone calls only so was no longer on IP. The post end of study call will be completed in approximately 30 days from today.

## 2021-04-25 DIAGNOSIS — M79671 Pain in right foot: Secondary | ICD-10-CM | POA: Insufficient documentation

## 2021-05-19 ENCOUNTER — Telehealth: Payer: Self-pay | Admitting: *Deleted

## 2021-05-19 NOTE — Telephone Encounter (Signed)
Just completed the post end of study phone call with subject for the CLEAR research study. All concomitant medications have been reviewed and updated. All AE's and SAE's have been reviewed and documented if applicable.

## 2021-05-24 DIAGNOSIS — L570 Actinic keratosis: Secondary | ICD-10-CM | POA: Diagnosis not present

## 2021-05-24 DIAGNOSIS — Z85828 Personal history of other malignant neoplasm of skin: Secondary | ICD-10-CM | POA: Diagnosis not present

## 2021-05-24 DIAGNOSIS — L820 Inflamed seborrheic keratosis: Secondary | ICD-10-CM | POA: Diagnosis not present

## 2021-05-24 DIAGNOSIS — D692 Other nonthrombocytopenic purpura: Secondary | ICD-10-CM | POA: Diagnosis not present

## 2021-05-24 DIAGNOSIS — L821 Other seborrheic keratosis: Secondary | ICD-10-CM | POA: Diagnosis not present

## 2021-05-27 ENCOUNTER — Encounter: Payer: Self-pay | Admitting: Internal Medicine

## 2021-05-27 ENCOUNTER — Telehealth (INDEPENDENT_AMBULATORY_CARE_PROVIDER_SITE_OTHER): Payer: Medicare HMO | Admitting: Internal Medicine

## 2021-05-27 VITALS — Ht 68.5 in | Wt 172.0 lb

## 2021-05-27 DIAGNOSIS — I422 Other hypertrophic cardiomyopathy: Secondary | ICD-10-CM | POA: Diagnosis not present

## 2021-05-27 DIAGNOSIS — Z95810 Presence of automatic (implantable) cardiac defibrillator: Secondary | ICD-10-CM

## 2021-05-27 DIAGNOSIS — T466X5A Adverse effect of antihyperlipidemic and antiarteriosclerotic drugs, initial encounter: Secondary | ICD-10-CM | POA: Diagnosis not present

## 2021-05-27 DIAGNOSIS — Z8673 Personal history of transient ischemic attack (TIA), and cerebral infarction without residual deficits: Secondary | ICD-10-CM

## 2021-05-27 DIAGNOSIS — E785 Hyperlipidemia, unspecified: Secondary | ICD-10-CM | POA: Diagnosis not present

## 2021-05-27 DIAGNOSIS — I2510 Atherosclerotic heart disease of native coronary artery without angina pectoris: Secondary | ICD-10-CM | POA: Diagnosis not present

## 2021-05-27 DIAGNOSIS — M791 Myalgia, unspecified site: Secondary | ICD-10-CM

## 2021-05-27 DIAGNOSIS — Z9861 Coronary angioplasty status: Secondary | ICD-10-CM

## 2021-05-27 NOTE — Patient Instructions (Addendum)
Medication Instructions:  WE WILL PLAN TO START REPATHA AFTER YOUR BLOOD WORK -REPATHA 140mg  INJECTION EVERY 14 DAYS Dr. Debara Pickett recommends Repatha 140mg  (PCSK9). This is an injectable cholesterol medication self-administered once every 14 days. This medication will likely need prior approval with your insurance company, which we will work on. If the medication is not approved initially, we may need to do an appeal with your insurance.   Administer medication in area of fatty tissue such as abdomen, outer thigh, back of upper arm - and rotate site with each injection Store medication in refrigerator until ready to administer - allow to sit at room temp for 30 mins - 1 hour prior to injection Dispose of medication in a SHARPS container - your pharmacy should be able to direct you on this and proper disposal   If you need a co-pay card for Repatha: [email or web address] >> paying for Repatha or red box that says "Teresita" in top right If you need a co-pay card for Praluent: WedMap.it >> starting & paying for Praluent  Patient Assistance:  The Health Well foundation offers assistance to help pay for medication copays.  They will cover copays for all cholesterol lowering meds, including statins, fibrates, omega-3 fish oils like Vascepa, ezetimibe, Repatha, Praluent, Nexletol, Nexlizet.  The cards are usually good for $2,500 or 12 months, whichever comes first. Go to healthwellfoundation.org Click on "Apply Now" Answer questions as to whom is applying (patient or representative) Your disease fund will be "hypercholesterolemia - Medicare access" They will ask questions about finances and which medications you are taking for cholesterol When you submit, the approval is usually within minutes.  You will need to print the card information from the site You will need to show this information to your pharmacy, they will bill your Medicare Part D plan first -then bill Health Well --for the copay.   You can  also call them at [phone], although the hold times can be quite long.   *If you need a refill on your cardiac medications before your next appointment, please call your pharmacy*  Lab Work: PLEASE RETURN TO OFFICE NEXT WEEK FOR FASTING BLOOD WORK, AND THEN RETURN IN 3 MONTHS FOR REPEAT FASTING BLOOD WORK.  If you have labs (blood work) drawn today and your tests are completely normal, you will receive your results only by: [place] (if you have MyChart) OR A paper copy in the mail If you have any lab test that is abnormal or we need to change your treatment, we will call you to review the results.  Follow-Up: At [facility], you and your health needs are our priority.  As part of our continuing mission to provide you with exceptional heart care, we have created designated Provider Care Teams.  These Care Teams include your primary Cardiologist (physician) and Advanced Practice Providers (APPs -  Physician Assistants and Nurse Practitioners) who all work together to provide you with the care you need, when you need it.  Your next appointment:   3 month(s) LIPID CLINIC   The format for your next appointment:   In Person  Provider:   Raliegh Ip Mali Hilty, MD

## 2021-05-27 NOTE — Progress Notes (Signed)
Virtual Visit via Video Note   This visit type was conducted due to national recommendations for restrictions regarding the COVID-19 Pandemic (e.g. social distancing) in an effort to limit this patient's exposure and mitigate transmission in our community.  Due to his co-morbid illnesses, this patient is at least at moderate risk for complications without adequate follow up.  This format is felt to be most appropriate for this patient at this time.  All issues noted in this document were discussed and addressed.  A limited physical exam was performed with this format.  Please refer to the patient's chart for his consent to telehealth for [facility].      Date:  05/27/2021   ID:  Brandon Gonzales, DOB 1951/01/31, MRN [id number] The patient was identified using 2 identifiers.  Evaluation Performed:  New Patient Evaluation  Patient Location:  [address] [place] [address]  Provider location:   [address] [place], [place] [address]  PCP:  Nicolette Bang, MD  Cardiologist:  Peter Martinique, MD Electrophysiologist:  Cristopher Peru, MD   Chief Complaint:  Manage dyslipidemia  History of Present Illness:    Brandon Gonzales is a 70 y.o. male who presents via audio/video conferencing for a telehealth visit today.  This is a pleasant 70 year old male with a complex cardiac history including coronary artery disease with stents to the LAD and circumflex with prior STEMI, hypertrophic cardiomyopathy who recently had an ICD placed, prior myocardial infarction, dyslipidemia, hypertension and stroke.  Unfortunately, he has been intolerant to numerous statins including rosuvastatin, atorvastatin and ezetimibe, all causing severe leg cramps.  He was referred to the research foundation and was enrolled in the CLEAR-outcomes trial, however he was randomized to placebo and therefore had no benefit from bempedoic acid.  He just completed the trial and was referred by his  primary care provider for options for management of his dyslipidemia.  His most recent lipid profile was in October however appears to be fairly stable with his other previous studies.  This demonstrated total cholesterol 208, triglycerides 238, HDL 27 and LDL of 133.  His target LDL is less than 70.  He says despite an excellent diet and work at trying to lower his cholesterol, he has not been able to get his LDL to target.  The patient does not have symptoms concerning for COVID-19 infection (fever, chills, cough, or new SHORTNESS OF BREATH).    Prior CV studies:   The following studies were reviewed today:  Chart reviewed, lab work  PMHx:  Past Medical History:  Diagnosis Date   Arthritis    Cancer ([place])    SKIN CA  SQUAMOUS CELL   Coronary artery disease    a. 2007 Stent to LAD, PTCA D1;  b. DES to LAD & LCx 10/13 in the setting of STEMI.   Dyslipidemia    a. Statin and zetia-intolerant. On niacin.   GERD (gastroesophageal reflux disease)    Heart murmur    Hyperlipidemia    Phreesia 11/22/2020   Hypertension    Hypertrophic cardiomyopathy ([place])    a. 09/2012 Echo: EF 55%, mid to apical anterolateral, posterior, apical anterior, apical HK, Gr1 DD, turbulence across LVOT suggesting degree of obstruction, mild MR, mildly dil LA.   Myocardial infarction [facility])    Phreesia 11/22/2020   Stroke [facility])    Phreesia 11/22/2020    Past Surgical History:  Procedure Laterality Date   CARDIAC CATHETERIZATION  2007   stent to LAD  and PTCA of diagonal branch   CARDIAC CATHETERIZATION  2010   CORONARY ANGIOPLASTY WITH STENT PLACEMENT  2013   95-99% prox LAD, patent LAD stent distal to this, occluded mid-dist LCx, mild <10% RCA irreg; s/p DES-prox LAD & DES to mid-dist LCx   CORONARY BALLOON ANGIOPLASTY N/A 05/16/2017   Procedure: Coronary Balloon Angioplasty;  Surgeon: Leonie Man, MD;  Location: [place] CV LAB;  Service: Cardiovascular;  Laterality: N/A;   [place]    right   ICD IMPLANT N/A 03/10/2021   Procedure: ICD IMPLANT;  Surgeon: Evans Lance, MD;  Location: [place] CV LAB;  Service: Cardiovascular;  Laterality: N/A;   KNEE SURGERY  1993   left   LEFT HEART CATH Bilateral 09/22/2012   Procedure: LEFT HEART CATH;  Surgeon: Peter M Martinique, MD;  Location: [facility] C[facility] LAB;  Service: Cardiovascular;  Laterality: Bilateral;   LEFT HEART CATH AND CORONARY ANGIOGRAPHY N/A 05/16/2017   Procedure: Left Heart Cath and Coronary Angiography;  Surgeon: Leonie Man, MD;  Location: [place] CV LAB;  Service: Cardiovascular;  Laterality: N/A;   PERCUTANEOUS CORONARY STENT INTERVENTION (PCI-S) Right 09/22/2012   Procedure: PERCUTANEOUS CORONARY STENT INTERVENTION (PCI-S);  Surgeon: Peter M Martinique, MD;  Location: [facility] CATH LAB;  Service: Cardiovascular;  Laterality: Right;    FAMHx:  Family History  Problem Relation Age of Onset   Heart attack Mother 79   Heart disease Mother     SOCHx:   reports that he has never smoked. He has never used smokeless tobacco. He reports that he does not drink alcohol and does not use drugs.  ALLERGIES:  Allergies  Allergen Reactions   Antihistamines, Chlorpheniramine-Type Other (See Comments)    Altered mental status   Pheniramine Other (See Comments)    Altered mental status   Statins Other (See Comments)    Leg cramps     Zetia [Ezetimibe] Other (See Comments)    Leg cramps    MEDS:  Current Meds  Medication Sig   amLODipine (NORVASC) 2.5 MG tablet TAKE 1 TABLET BY MOUTH EVERY DAY (Patient taking differently: Take 2.5 mg by mouth daily.)   carvedilol (COREG) 12.5 MG tablet TAKE 0.5 TABLETS (6.25 MG TOTAL) BY MOUTH 2 (TWO) TIMES DAILY.   clopidogrel (PLAVIX) 75 MG tablet Take 1 tablet (75 mg total) by mouth daily.   esomeprazole (NEXIUM) 20 MG capsule Take 20 mg by mouth every morning.   lisinopril (ZESTRIL) 40 MG tablet TAKE 1 TABLET BY MOUTH EVERY DAY   nitroGLYCERIN (NITROSTAT) 0.4 MG SL tablet Place 0.4  mg under the tongue every 5 (five) minutes as needed for chest pain.   valACYclovir (VALTREX) 500 MG tablet Take 1 tablet (500 mg total) by mouth daily as needed (fever blisters).     ROS: Pertinent items noted in HPI and remainder of comprehensive ROS otherwise negative.  Labs/Other Tests and Data Reviewed:    Recent Labs: 09/15/2020: ALT 25 03/01/2021: BUN 22; Creatinine, Ser 1.35; Hemoglobin 15.9; Platelets 171; Potassium 4.2; Sodium 140   Recent Lipid Panel Lab Results  Component Value Date/Time   CHOL 208 (H) 09/16/2020 04:28 AM   CHOL 195 09/10/2019 10:21 AM   TRIG 238 (H) 09/16/2020 04:28 AM   HDL 27 (L) 09/16/2020 04:28 AM   HDL 31 (L) 09/10/2019 10:21 AM   CHOLHDL 7.7 09/16/2020 04:28 AM   LDLCALC 133 (H) 09/16/2020 04:28 AM   LDLCALC 138 (H) 09/10/2019 10:21 AM   LDLDIRECT  159.5 10/01/2012 08:47 AM    Wt Readings from Last 3 Encounters:  05/27/21 172 lb (78 kg)  03/10/21 170 lb (77.1 kg)  03/01/21 179 lb 12.8 oz (81.6 kg)     Exam:    Vital Signs:  Ht 5' 8.5" (1.74 m)   Wt 172 lb (78 kg)   BMI 25.77 kg/m    General appearance: alert, appears stated age, and no distress Lungs: No visual respiratory difficulty Abdomen: Normal weight Extremities: extremities normal, atraumatic, no cyanosis or edema Skin: Skin color, texture, turgor normal. No rashes or lesions Neurologic: Grossly normal : Pleasant  ASSESSMENT & PLAN:    Mixed dyslipidemia, goal LDL less than 70 Coronary artery disease with prior STEMI and multiple PCI's Hypertrophic cardiomyopathy status post AICD Hypertension History of stroke Statin and ezetimibe intolerance-myalgias  Mr. Ericksen has a mixed dyslipidemia but unfortunate is been statin and ezetimibe intolerant.  This caused significant myalgias.  He was enrolled in the bempedoic acid trials however was randomized to placebo and has had no benefit over the past several years.  He is a good candidate for PCSK9 inhibitor and needs 50%  reduction in LDL cholesterol.  Would recommend Repatha 140 mg every 2 weeks.  We will reach out to his insurance for prior authorization.  We will need to update his lipid profile and he will get a fasting lipid next week.  Plan follow-up with me in about 3 to 4 months for repeat lipid assessment after starting therapy.  Thanks for the kind referral.  COVID-19 Education: The signs and symptoms of COVID-19 were discussed with the patient and how to seek care for testing (follow up with PCP or arrange E-visit).  The importance of social distancing was discussed today.  Patient Risk:   After full review of this patients clinical status, I feel that they are at least moderate risk at this time.  Time:   Today, I have spent 25 minutes with the patient with telehealth technology discussing dyslipidemia, statin intolerance, review of medical history.     Medication Adjustments/Labs and Tests Ordered: Current medicines are reviewed at length with the patient today.  Concerns regarding medicines are outlined above.   Tests Ordered: Orders Placed This Encounter  Procedures   Lipid panel   Lipid panel     Medication Changes: No orders of the defined types were placed in this encounter.   Disposition:  in 3 month(s)  Pixie Casino, MD, [facility], [place] Director of the Advanced Lipid Disorders &  Cardiovascular Risk Reduction Clinic Diplomate of the American Board of Clinical Lipidology Attending Cardiologist  Direct Dial: [phone]  Fax: [phone]  Website:  [email or web address]  Pixie Casino, MD  05/27/2021 8:58 AM

## 2021-05-30 ENCOUNTER — Telehealth: Payer: Self-pay | Admitting: Internal Medicine

## 2021-05-30 NOTE — Telephone Encounter (Signed)
PA for praluent 150mg /mL submitted via CMM Preferred PCSK9i w/[employer] (Key: [id number])

## 2021-05-30 NOTE — Telephone Encounter (Signed)
Medication approved from 12/04/2020 - 12/03/2021

## 2021-06-02 DIAGNOSIS — E785 Hyperlipidemia, unspecified: Secondary | ICD-10-CM | POA: Diagnosis not present

## 2021-06-02 LAB — LIPID PANEL
Chol/HDL Ratio: 6.2 ratio — ABNORMAL HIGH (ref 0.0–5.0)
Cholesterol, Total: 210 mg/dL — ABNORMAL HIGH (ref 100–199)
HDL: 34 mg/dL — ABNORMAL LOW (ref >39)
LDL Chol Calc (NIH): 155 mg/dL — ABNORMAL HIGH (ref 0–99)
Triglycerides: 114 mg/dL (ref 0–149)
VLDL Cholesterol Cal: 21 mg/dL (ref 5–40)

## 2021-06-10 ENCOUNTER — Ambulatory Visit (INDEPENDENT_AMBULATORY_CARE_PROVIDER_SITE_OTHER): Payer: Medicare HMO

## 2021-06-10 DIAGNOSIS — I472 Ventricular tachycardia: Secondary | ICD-10-CM | POA: Diagnosis not present

## 2021-06-10 DIAGNOSIS — I4729 Other ventricular tachycardia: Secondary | ICD-10-CM

## 2021-06-12 LAB — CUP PACEART REMOTE DEVICE CHECK
Battery Remaining Longevity: 79 mo
Battery Remaining Percentage: 95 %
Battery Voltage: 3.05 V
Brady Statistic AP VP Percent: 1 %
Brady Statistic AP VS Percent: 49 %
Brady Statistic AS VP Percent: 1.4 %
Brady Statistic AS VS Percent: 48 %
Brady Statistic RA Percent Paced: 48 %
Brady Statistic RV Percent Paced: 1.4 %
Date Time Interrogation Session: 20220708020108
HighPow Impedance: 70 Ohm
Implantable Lead Implant Date: 20220407
Implantable Lead Implant Date: 20220407
Implantable Lead Location: 753859
Implantable Lead Location: 753860
Implantable Lead Model: 7122
Implantable Pulse Generator Implant Date: 20220407
Lead Channel Impedance Value: 440 Ohm
Lead Channel Impedance Value: 590 Ohm
Lead Channel Pacing Threshold Amplitude: 0.5 V
Lead Channel Pacing Threshold Amplitude: 0.75 V
Lead Channel Pacing Threshold Pulse Width: 0.5 ms
Lead Channel Pacing Threshold Pulse Width: 0.5 ms
Lead Channel Sensing Intrinsic Amplitude: 11.8 mV
Lead Channel Sensing Intrinsic Amplitude: 3.1 mV
Lead Channel Setting Pacing Amplitude: 3.5 V
Lead Channel Setting Pacing Amplitude: 3.5 V
Lead Channel Setting Pacing Pulse Width: 0.5 ms
Lead Channel Setting Sensing Sensitivity: 0.5 mV
Pulse Gen Serial Number: 810023633

## 2021-06-13 MED ORDER — PRALUENT 150 MG/ML ~~LOC~~ SOAJ: 1.0000 | SUBCUTANEOUS | 11 refills | Status: DC

## 2021-06-13 NOTE — Addendum Note (Signed)
Addended by: Fidel Levy on: 06/13/2021 03:44 PM   Modules accepted: Orders

## 2021-06-13 NOTE — Telephone Encounter (Signed)
Spoke with patient about lab results, PCSK9. [facility]

## 2021-06-21 ENCOUNTER — Other Ambulatory Visit: Payer: Self-pay

## 2021-06-21 ENCOUNTER — Ambulatory Visit (INDEPENDENT_AMBULATORY_CARE_PROVIDER_SITE_OTHER): Payer: Medicare HMO | Admitting: Internal Medicine

## 2021-06-21 ENCOUNTER — Encounter: Payer: Self-pay | Admitting: Internal Medicine

## 2021-06-21 VITALS — BP 120/86 | HR 90 | Ht 68.5 in | Wt 177.0 lb

## 2021-06-21 DIAGNOSIS — Z95810 Presence of automatic (implantable) cardiac defibrillator: Secondary | ICD-10-CM | POA: Diagnosis not present

## 2021-06-21 DIAGNOSIS — I4729 Other ventricular tachycardia: Secondary | ICD-10-CM

## 2021-06-21 DIAGNOSIS — I422 Other hypertrophic cardiomyopathy: Secondary | ICD-10-CM

## 2021-06-21 DIAGNOSIS — I472 Ventricular tachycardia: Secondary | ICD-10-CM

## 2021-06-21 NOTE — Progress Notes (Signed)
HPI Mr. Brandon Gonzales returns today for his 3 month followup after DDD ICD insertion. He is a pleasant 70 yo man with HCM, prior stroke and is s/p ICD Insertion. He sustained an cryptogenic stroke remotely and has done well in the interim. He denies chest pain or sob.  Allergies  Allergen Reactions   Antihistamines, Chlorpheniramine-Type Other (See Comments)    Altered mental status   Pheniramine Other (See Comments)    Altered mental status   Statins Other (See Comments)    Leg cramps     Zetia [Ezetimibe] Other (See Comments)    Leg cramps     Current Outpatient Medications  Medication Sig Dispense Refill   Alirocumab (PRALUENT) 150 MG/ML SOAJ Inject 1 Dose into the skin every 14 (fourteen) days. 2 mL 11   amLODipine (NORVASC) 2.5 MG tablet TAKE 1 TABLET BY MOUTH EVERY DAY 90 tablet 2   carvedilol (COREG) 12.5 MG tablet TAKE 0.5 TABLETS (6.25 MG TOTAL) BY MOUTH 2 (TWO) TIMES DAILY. 90 tablet 3   clopidogrel (PLAVIX) 75 MG tablet Take 1 tablet (75 mg total) by mouth daily. 90 tablet 3   esomeprazole (NEXIUM) 20 MG capsule Take 20 mg by mouth every morning.     lisinopril (ZESTRIL) 40 MG tablet TAKE 1 TABLET BY MOUTH EVERY DAY 90 tablet 3   nitroGLYCERIN (NITROSTAT) 0.4 MG SL tablet Place 0.4 mg under the tongue every 5 (five) minutes as needed for chest pain.     valACYclovir (VALTREX) 500 MG tablet Take 1 tablet (500 mg total) by mouth daily as needed (fever blisters). 90 tablet 3   No current facility-administered medications for this visit.     Past Medical History:  Diagnosis Date   Arthritis    Cancer ([place])    SKIN CA  SQUAMOUS CELL   Coronary artery disease    a. 2007 Stent to LAD, PTCA D1;  b. DES to LAD & LCx 10/13 in the setting of STEMI.   Dyslipidemia    a. Statin and zetia-intolerant. On niacin.   GERD (gastroesophageal reflux disease)    Heart murmur    Hyperlipidemia    Phreesia 11/22/2020   Hypertension    Hypertrophic cardiomyopathy ([place])    a. 09/2012  Echo: EF 55%, mid to apical anterolateral, posterior, apical anterior, apical HK, Gr1 DD, turbulence across LVOT suggesting degree of obstruction, mild MR, mildly dil LA.   Myocardial infarction ([place])    Phreesia 11/22/2020   Stroke [facility])    Phreesia 11/22/2020    ROS:   All systems reviewed and negative except as noted in the HPI.   Past Surgical History:  Procedure Laterality Date   CARDIAC CATHETERIZATION  2007   stent to LAD and PTCA of diagonal branch   CARDIAC CATHETERIZATION  2010   CORONARY ANGIOPLASTY WITH STENT PLACEMENT  2013   95-99% prox LAD, patent LAD stent distal to this, occluded mid-dist LCx, mild <10% RCA irreg; s/p DES-prox LAD & DES to mid-dist LCx   CORONARY BALLOON ANGIOPLASTY N/A 05/16/2017   Procedure: Coronary Balloon Angioplasty;  Surgeon: Leonie Man, MD;  Location: [place] [facility];  Service: Cardiovascular;  Laterality: N/A;   [place]   right   ICD IMPLANT N/A 03/10/2021   Procedure: ICD IMPLANT;  Surgeon: Evans Lance, MD;  Location: [place] [facility];  Service: Cardiovascular;  Laterality: N/A;   KNEE SURGERY  1993   left   LEFT HEART CATH  Bilateral 09/22/2012   Procedure: LEFT HEART CATH;  Surgeon: Peter M Martinique, MD;  Location: [facility] CATH LAB;  Service: Cardiovascular;  Laterality: Bilateral;   LEFT HEART CATH AND CORONARY ANGIOGRAPHY N/A 05/16/2017   Procedure: Left Heart Cath and Coronary Angiography;  Surgeon: Leonie Man, MD;  Location: [place] [facility];  Service: Cardiovascular;  Laterality: N/A;   PERCUTANEOUS CORONARY STENT INTERVENTION (PCI-S) Right 09/22/2012   Procedure: PERCUTANEOUS CORONARY STENT INTERVENTION (PCI-S);  Surgeon: Peter M Martinique, MD;  Location: [facility] CATH LAB;  Service: Cardiovascular;  Laterality: Right;     Family History  Problem Relation Age of Onset   Heart attack Mother 69   Heart disease Mother      Social History   Socioeconomic History   Marital status: Married    Spouse name: Shirlean Mylar    Number of children: Not on file   Years of education: high school    Highest education level: High school graduate  Occupational History   Occupation: retired Airline pilot   Tobacco Use   Smoking status: Never   Smokeless tobacco: Never  Scientific laboratory technician Use: Never used  Substance and Sexual Activity   Alcohol use: No   Drug use: No   Sexual activity: Not Currently  Other Topics Concern   Not on file  Social History Narrative   Retired Airline pilot   Social Determinants of Radio broadcast assistant Strain: Not on Art therapist Insecurity: Not on file  Transportation Needs: Not on file  Physical Activity: Not on file  Stress: Not on file  Social Connections: Not on file  Intimate Partner Violence: Not on file     BP 120/86   Pulse 90   Ht 5' 8.5" (1.74 m)   Wt 177 lb (80.3 kg)   SpO2 92%   BMI 26.52 kg/m   Physical Exam:  Well appearing 70 yo man, NAD HEENT: Unremarkable Neck:  No JVD, no thyromegally Lymphatics:  No adenopathy Back:  No CVA tenderness Lungs:  Clear HEART:  Regular rate rhythm, 3/6 systolic murmurs, no rubs, no clicks Abd:  soft, positive bowel sounds, no organomegally, no rebound, no guarding Ext:  2 plus pulses, no edema, no cyanosis, no clubbing Skin:  No rashes no nodules Neuro:  CN II through XII intact, motor grossly intact  EKG - nsr with PVC's  DEVICE  Normal device function.  See PaceArt for details.   Assess/Plan:  HCM - he is s/p DDD ICD and no sustained arrhythmias.  Cryptogenic stroke - no atrial fib. He had atrial tachy for 8 seconds.  ICD - his st[employer] Jude DDD ICD is working normally. We will recheck in several months.  HTN - His bp is well controlled. No change in meds.  Carleene Overlie Amarise Lillo,MD

## 2021-06-21 NOTE — Patient Instructions (Signed)
Medication Instructions:  Your physician recommends that you continue on your current medications as directed. Please refer to the Current Medication list given to you today.  Labwork: None ordered.  Testing/Procedures: None ordered.  Follow-Up: Your physician wants you to follow-up in: one year with Cristopher Peru, MD or one of the following Advanced Practice Providers on your designated Care Team:   Tommye Standard, [place] Legrand Como "Jonni Sanger" Chalmers Cater, [place]  Remote monitoring is used to monitor your ICD from home. This monitoring reduces the number of office visits required to check your device to one time per year. It allows [place] to keep an eye on the functioning of your device to ensure it is working properly. You are scheduled for a device check from home on 09/09/2021. You may send your transmission at any time that day. If you have a wireless device, the transmission will be sent automatically. After your physician reviews your transmission, you will receive a postcard with your next transmission date.  Any Other Special Instructions Will Be Listed Below (If Applicable).  If you need a refill on your cardiac medications before your next appointment, please call your pharmacy.

## 2021-07-01 NOTE — Progress Notes (Signed)
Remote ICD transmission.   

## 2021-07-06 DIAGNOSIS — M79671 Pain in right foot: Secondary | ICD-10-CM | POA: Diagnosis not present

## 2021-08-03 DIAGNOSIS — M25571 Pain in right ankle and joints of right foot: Secondary | ICD-10-CM | POA: Diagnosis not present

## 2021-08-03 DIAGNOSIS — M79671 Pain in right foot: Secondary | ICD-10-CM | POA: Diagnosis not present

## 2021-08-03 DIAGNOSIS — M7671 Peroneal tendinitis, right leg: Secondary | ICD-10-CM | POA: Diagnosis not present

## 2021-08-19 ENCOUNTER — Other Ambulatory Visit: Payer: Self-pay

## 2021-08-19 ENCOUNTER — Emergency Department (HOSPITAL_COMMUNITY)
Admission: EM | Admit: 2021-08-19 | Discharge: 2021-08-19 | Disposition: A | Discharge status: Home / Self Care | Payer: Medicare HMO | Attending: Emergency Medicine | Admitting: Emergency Medicine

## 2021-08-19 ENCOUNTER — Encounter (HOSPITAL_COMMUNITY): Payer: Self-pay | Admitting: Emergency Medicine

## 2021-08-19 ENCOUNTER — Emergency Department (HOSPITAL_COMMUNITY): Payer: Medicare HMO

## 2021-08-19 DIAGNOSIS — R079 Chest pain, unspecified: Secondary | ICD-10-CM | POA: Insufficient documentation

## 2021-08-19 DIAGNOSIS — Z743 Need for continuous supervision: Secondary | ICD-10-CM | POA: Diagnosis not present

## 2021-08-19 DIAGNOSIS — Z5321 Procedure and treatment not carried out due to patient leaving prior to being seen by health care provider: Secondary | ICD-10-CM | POA: Diagnosis not present

## 2021-08-19 DIAGNOSIS — M542 Cervicalgia: Secondary | ICD-10-CM | POA: Insufficient documentation

## 2021-08-19 DIAGNOSIS — R0789 Other chest pain: Secondary | ICD-10-CM | POA: Diagnosis not present

## 2021-08-19 DIAGNOSIS — I10 Essential (primary) hypertension: Secondary | ICD-10-CM | POA: Insufficient documentation

## 2021-08-19 DIAGNOSIS — R0902 Hypoxemia: Secondary | ICD-10-CM | POA: Diagnosis not present

## 2021-08-19 DIAGNOSIS — G4489 Other headache syndrome: Secondary | ICD-10-CM | POA: Diagnosis not present

## 2021-08-19 LAB — BASIC METABOLIC PANEL
Anion gap: 8 (ref 5–15)
BUN: 23 mg/dL (ref 8–23)
CO2: 29 mmol/L (ref 22–32)
Calcium: 9.7 mg/dL (ref 8.9–10.3)
Chloride: 106 mmol/L (ref 98–111)
Creatinine, Ser: 1.48 mg/dL — ABNORMAL HIGH (ref 0.61–1.24)
GFR, Estimated: 50 mL/min — ABNORMAL LOW (ref >60)
Glucose, Bld: 109 mg/dL — ABNORMAL HIGH (ref 70–99)
Potassium: 5 mmol/L (ref 3.5–5.1)
Sodium: 143 mmol/L (ref 135–145)

## 2021-08-19 LAB — CBC
HCT: 45.3 % (ref 39.0–52.0)
Hemoglobin: 15.5 g/dL (ref 13.0–17.0)
MCH: 30.7 pg (ref 26.0–34.0)
MCHC: 34.2 g/dL (ref 30.0–36.0)
MCV: 89.7 fL (ref 80.0–100.0)
Platelets: 155 10*3/uL (ref 150–400)
RBC: 5.05 MIL/uL (ref 4.22–5.81)
RDW: 12.9 % (ref 11.5–15.5)
WBC: 6.7 10*3/uL (ref 4.0–10.5)
nRBC: 0 % (ref 0.0–0.2)

## 2021-08-19 LAB — TROPONIN I (HIGH SENSITIVITY): Troponin I (High Sensitivity): 22 ng/L — ABNORMAL HIGH (ref <18)

## 2021-08-19 NOTE — ED Provider Notes (Signed)
Emergency Medicine Provider Triage Evaluation Note  Brandon Gonzales , a 70 y.o. male  was evaluated in triage.  Pt complains of chest pain that started yesterday at 6:00pm.  Feels like indigestion.  Has cardiac hx.  Some improvement with nitro.  Review of Systems  Positive: Chest pain, SOB Negative: Fever, chills  Physical Exam  There were no vitals taken for this visit. Gen:   Awake, no distress   Resp:  Normal effort  MSK:   Moves extremities without difficulty  Other:    Medical Decision Making  Medically screening exam initiated at 2:55 AM.  Appropriate orders placed.  Brandon Gonzales was informed that the remainder of the evaluation will be completed by another provider, this initial triage assessment does not replace that evaluation, and the importance of remaining in the ED until their evaluation is complete.  Chest pain   [name], PA-C 08/19/21 0256    Arnaldo Natal, MD 08/19/21 [phone]

## 2021-08-19 NOTE — ED Triage Notes (Signed)
Patient with chest pain since 6pm last evening.  Thought is would go away, radiates to neck.  Patient was given nitro and gave him some relief of pain.  Patient with cardiac history.  Hypertensive.

## 2021-08-19 NOTE — ED Notes (Signed)
Patient decided to leave.   

## 2021-08-23 ENCOUNTER — Telehealth: Payer: Self-pay | Admitting: Internal Medicine

## 2021-08-23 ENCOUNTER — Telehealth (INDEPENDENT_AMBULATORY_CARE_PROVIDER_SITE_OTHER): Payer: Medicare HMO | Admitting: Nurse Practitioner

## 2021-08-23 DIAGNOSIS — R778 Other specified abnormalities of plasma proteins: Secondary | ICD-10-CM

## 2021-08-23 DIAGNOSIS — R079 Chest pain, unspecified: Secondary | ICD-10-CM | POA: Diagnosis not present

## 2021-08-23 NOTE — Telephone Encounter (Signed)
Spoke with patient of Dr. Martinique. He states on 9/16 he went to ED - felt like he couldn't take a deep breath. He reports he had indigestion really bad, couldn't stop burping during the day prior but ate nothing that would trigger this. EMS gave him NTG in route to hospital. He reports his troponin was elevated.   He had a telehealth visit with PCP today who advised he contact our office.   He has no complaints at the present time. He would like MD to review ED notes, labs.   He does not have an appt with gen cardiologist until Dec 2022 There is a DOD opening on 09/02/21

## 2021-08-23 NOTE — Progress Notes (Signed)
Virtual Visit via Video Note  I connected with Brandon Gonzales on 08/23/21 at  1:50 PM EDT by a video enabled telemedicine application and verified that I am speaking with the correct person using two identifiers.  Location: Patient: home Provider: office   I discussed the limitations of evaluation and management by telemedicine and the availability of in person appointments. The patient expressed understanding and agreed to proceed.  History of Present Illness:  Patient presents today for ED follow-up through video visit.  Patient was seen in the ED on 08/20/2019 today.  He presented to the ED with chest pain.  Patient does have cardiac history and is followed by cardiology.  Patient's initial evaluation was started in the ED but patient did not stay he left the ED AMA.  Patient is called today to get lab results from his ED visit.  Overall labs were stable.  We did discuss that his troponin level was slightly elevated and EKG was abnormal.  Chest x-ray was clear.  Patient was advised to follow-up with his cardiologist.  Overall patient states that he is much improved and no longer having chest pain at this time. Denies f/c/s, n/v/d, hemoptysis, PND, chest pain or edema.     Observations/Objective:  Vitals with BMI 08/19/2021 06/21/2021 05/27/2021  Height - 5' 8.5" 5' 8.5"  Weight - 177 lbs 172 lbs  BMI - 06.23 76.28  Systolic 315 176 -  Diastolic 160 86 -  Pulse 67 90 -      Assessment and Plan:  Intermittent chest pain Abnormal EKG Elevated troponin:  Advised patient to follow with his cardiologist for further evaluation   Follow up:  Follow up if needed     I discussed the assessment and treatment plan with the patient. The patient was provided an opportunity to ask questions and all were answered. The patient agreed with the plan and demonstrated an understanding of the instructions.   The patient was advised to call back or seek an in-person evaluation if the symptoms  worsen or if the condition fails to improve as anticipated.  I provided 23 minutes of non-face-to-face time during this encounter.   Fenton Foy, NP

## 2021-08-23 NOTE — Telephone Encounter (Signed)
Pt c/o of Chest Pain: STAT if CP now or developed within 24 hours  1. Are you having CP right now? No; over the weekend; enzymes levels are elevated  2. Are you experiencing any other symptoms (ex. SOB, nausea, vomiting, sweating)? Could not take deep breath in left lung; severe indegestion  3. How long have you been experiencing CP? Early Friday mornings-Sunday morning  4. Is your CP continuous or coming and going? Continuous   5. Have you taken Nitroglycerin? EMS gave him one on the way to hospital ?

## 2021-08-23 NOTE — Telephone Encounter (Signed)
Spoke to patient he stated PCP requested him to be seen sooner than December.Advised Dr.Jordan has had a cancellation 9/30 at 9:30 am,appointment scheduled.

## 2021-08-24 ENCOUNTER — Encounter: Payer: Self-pay | Admitting: Nurse Practitioner

## 2021-08-24 NOTE — Patient Instructions (Signed)
Intermittent chest pain Abnormal EKG Elevated troponin:  Advised patient to follow with his cardiologist for further evaluation   Follow up:  Follow up if needed
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
## 2021-09-02 ENCOUNTER — Encounter: Payer: Self-pay | Admitting: Cardiology

## 2021-09-02 ENCOUNTER — Ambulatory Visit: Payer: Medicare HMO | Admitting: Cardiology

## 2021-09-02 ENCOUNTER — Other Ambulatory Visit: Payer: Self-pay

## 2021-09-02 VITALS — BP 140/90 | HR 54 | Ht 68.0 in | Wt 175.0 lb

## 2021-09-02 DIAGNOSIS — E782 Mixed hyperlipidemia: Secondary | ICD-10-CM

## 2021-09-02 DIAGNOSIS — Z95810 Presence of automatic (implantable) cardiac defibrillator: Secondary | ICD-10-CM

## 2021-09-02 DIAGNOSIS — I422 Other hypertrophic cardiomyopathy: Secondary | ICD-10-CM

## 2021-09-02 DIAGNOSIS — I472 Ventricular tachycardia: Secondary | ICD-10-CM | POA: Diagnosis not present

## 2021-09-02 DIAGNOSIS — Z9861 Coronary angioplasty status: Secondary | ICD-10-CM | POA: Diagnosis not present

## 2021-09-02 DIAGNOSIS — I2510 Atherosclerotic heart disease of native coronary artery without angina pectoris: Secondary | ICD-10-CM

## 2021-09-02 DIAGNOSIS — I4729 Other ventricular tachycardia: Secondary | ICD-10-CM

## 2021-09-02 DIAGNOSIS — R079 Chest pain, unspecified: Secondary | ICD-10-CM

## 2021-09-09 ENCOUNTER — Ambulatory Visit (INDEPENDENT_AMBULATORY_CARE_PROVIDER_SITE_OTHER): Payer: Medicare HMO

## 2021-09-09 DIAGNOSIS — I422 Other hypertrophic cardiomyopathy: Secondary | ICD-10-CM

## 2021-09-12 ENCOUNTER — Other Ambulatory Visit: Payer: Self-pay | Admitting: Cardiology

## 2021-09-13 LAB — CUP PACEART REMOTE DEVICE CHECK
Battery Remaining Longevity: 96 mo
Battery Remaining Percentage: 92 %
Battery Voltage: 3.04 V
Brady Statistic AP VP Percent: 1 %
Brady Statistic AP VS Percent: 53 %
Brady Statistic AS VP Percent: 1 %
Brady Statistic AS VS Percent: 46 %
Brady Statistic RA Percent Paced: 51 %
Brady Statistic RV Percent Paced: 1 %
Date Time Interrogation Session: 20221007020126
HighPow Impedance: 71 Ohm
Implantable Lead Implant Date: 20220407
Implantable Lead Implant Date: 20220407
Implantable Lead Location: 753859
Implantable Lead Location: 753860
Implantable Lead Model: 7122
Implantable Pulse Generator Implant Date: 20220407
Lead Channel Impedance Value: 390 Ohm
Lead Channel Impedance Value: 630 Ohm
Lead Channel Pacing Threshold Amplitude: 0.75 V
Lead Channel Pacing Threshold Amplitude: 0.75 V
Lead Channel Pacing Threshold Pulse Width: 0.5 ms
Lead Channel Pacing Threshold Pulse Width: 0.5 ms
Lead Channel Sensing Intrinsic Amplitude: 11.8 mV
Lead Channel Sensing Intrinsic Amplitude: 2 mV
Lead Channel Setting Pacing Amplitude: 2 V
Lead Channel Setting Pacing Amplitude: 2.5 V
Lead Channel Setting Pacing Pulse Width: 0.5 ms
Lead Channel Setting Sensing Sensitivity: 0.5 mV
Pulse Gen Serial Number: 810023633

## 2021-09-15 DIAGNOSIS — M545 Low back pain, unspecified: Secondary | ICD-10-CM | POA: Insufficient documentation

## 2021-09-15 DIAGNOSIS — M79671 Pain in right foot: Secondary | ICD-10-CM | POA: Insufficient documentation

## 2021-09-15 DIAGNOSIS — M25571 Pain in right ankle and joints of right foot: Secondary | ICD-10-CM | POA: Diagnosis not present

## 2021-09-15 DIAGNOSIS — M79672 Pain in left foot: Secondary | ICD-10-CM | POA: Diagnosis not present

## 2021-09-15 DIAGNOSIS — M25572 Pain in left ankle and joints of left foot: Secondary | ICD-10-CM | POA: Diagnosis not present

## 2021-09-19 NOTE — Progress Notes (Signed)
Remote ICD transmission.   

## 2021-09-22 ENCOUNTER — Other Ambulatory Visit: Payer: Self-pay | Admitting: *Deleted

## 2021-09-22 DIAGNOSIS — E785 Hyperlipidemia, unspecified: Secondary | ICD-10-CM

## 2021-10-04 ENCOUNTER — Telehealth: Payer: Self-pay
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
## 2021-10-05 ENCOUNTER — Ambulatory Visit: Payer: Medicare HMO | Admitting: Internal Medicine

## 2021-10-08 ENCOUNTER — Other Ambulatory Visit: Payer: Self-pay | Admitting: Cardiology

## 2021-10-12 ENCOUNTER — Telehealth: Payer: Self-pay

## 2021-10-12 NOTE — Telephone Encounter (Signed)
Called patient to discuss the PREVAIL study. Patient didn't answer so message was left asking for a return phone to discuss the study in detail

## 2021-11-01 ENCOUNTER — Other Ambulatory Visit: Payer: Self-pay | Admitting: Cardiology

## 2021-11-08 DIAGNOSIS — L821 Other seborrheic keratosis: Secondary | ICD-10-CM | POA: Diagnosis not present

## 2021-11-08 DIAGNOSIS — Z85820 Personal history of malignant melanoma of skin: Secondary | ICD-10-CM | POA: Diagnosis not present

## 2021-11-08 DIAGNOSIS — L570 Actinic keratosis: Secondary | ICD-10-CM | POA: Diagnosis not present

## 2021-11-08 DIAGNOSIS — L812 Freckles: Secondary | ICD-10-CM | POA: Diagnosis not present

## 2021-11-08 DIAGNOSIS — Z85828 Personal history of other malignant neoplasm of skin: Secondary | ICD-10-CM | POA: Diagnosis not present

## 2021-11-09 DIAGNOSIS — E785 Hyperlipidemia, unspecified: Secondary | ICD-10-CM | POA: Diagnosis not present

## 2021-11-09 LAB — LIPID PANEL
Chol/HDL Ratio: 2.8 ratio (ref 0.0–5.0)
Cholesterol, Total: 111 mg/dL (ref 100–199)
HDL: 40 mg/dL (ref >39)
LDL Chol Calc (NIH): 54 mg/dL (ref 0–99)
Triglycerides: 87 mg/dL (ref 0–149)
VLDL Cholesterol Cal: 17 mg/dL (ref 5–40)

## 2021-11-13 NOTE — Progress Notes (Signed)
Cardiology Office Note   Date:  11/15/2021   ID:  Brandon Gonzales, DOB 1950-12-31, MRN [id number]  PCP:  Nicolette Bang, MD  Cardiologist:   Lizandro Spellman Martinique, MD   No chief complaint on file.      History of Present Illness: Brandon Gonzales is a 70 y.o. male who presents for follow up CAD and HCM. He has a PMH including CAD prior mid LAD stenting in 2007 with  PTCA to D1, followed by lateral STEMI and PCI/DES to the midLCX & proximalLAD in 2013. S/p cutting balloon PTCA of the LAD in June 2019 for in stent restenosis.  He also has a h/o HTN, HL, GERD, and hypertrophic cardiomyopathy.     He called on 02/19/17 with complaints of dyspnea and dizziness that he states were similar to when he had his MI. He felt very flushed in his face. He was referred to the ED.  BP recorded by EMS was 210/107. Came down to 159/87 in ED.  Ecg showed NSR with a RBBB- new in January 2018. No acute change. Delta troponin was normal. UA, BMET, BNP were all normal. He was discharged on amlodipine 5 mg daily.    He was evaluated with Echo in April 2018 showing moderate LVH with severe basal septal hypertrophy. No significant outflow gradient. Mild MR. Myoview study showed a small area of scar at the base of the lateral wall. No ischemia. EF 51%. He presented in June 2018 with symptoms of worsening chest pain and dyspnea on exertion. He underwent cardiac cath showing 95% in stent restenosis of the proximal LAD treated with cutting  balloon angioplasty. There was a 65% mid RCA stenosis and 50% in the mid LCx. He was enrolled in the CLEAR trial for hyperlipidemia given history of statin and Zetia intolerance.   The patient was seen in the office 12/17/2019 after an episode of near syncope a couple on New Year's Day.  They had just eaten and he was in his car with his daughter (who is a paramedic) driving a plate of food over to his brother's house.  While driving he became weak and near syncopal.  He denied any  diaphoresis, shortness of breath, or nausea and vomiting.  His daughter took his pulse and thought he might be in atrial fibrillation but if it was present it did not last long.  His symptoms resolved quickly.  He did have a recurrent episode the following 2 days but none since.    A 7 day ZIO monitor was done and showed episodic bradycardia down to the 40"s- usually early am.  He also had frequent PVCs and short runs of PSVT.  In addition he had runs of NSWCT that appear to be NSVT. The longest was 14 beats.  He has had no further episodes of near syncope or symptomatic tachycardia.  Cardiac MRI was ordered with results noted below. Given syncopal episode, wide complex tachycardia noted on monitor, HCM with late gadolinium uptake on MRI and relatively low EF 52% I recommended EP evaluation to see if ICD indicated. He initially deferred. Seen by Dr Lovena Le on 03/23/20. He did eventually undergo ICD implant in April 2022.   He was admitted in October 2021 with a stroke. Had right arm and 4th and 5th finger numbness and weakness. Multiple left MCA infarcts embolic secondary to unknown source.CTA head & neck advanced atherosclerosis B ICA bifurcation and bulbs. B ICA 30% stenosis w/ pronounced irregularity. Proximal R Subclavian 50%.  R VA origin 30%. L VA origin 50%. Mid to distal BA. No LVO. MRI  showed Punctate L primary motor cortec and L subcortical parietal white matter infarcts. MR CS multilevel CX spondylosis w/ moderate and severe foraminal stenosis / narrowing C4-11. Echo showed no cardioembolic source. He was seen by EP for consideration of loop recorder but after discussion he did agree to have ICD implant. Since ICD implant no Afib noted. He was subsequently started on Praluent for his hyperlipidemia.   He was seen in the ED on 08/23/21 with chest pain. States he had severe chest pain radiating into his neck bilateral and it hurt to take a deep breath. This lasted a couple of hours at least. Did not  change with sl Ntg x 1. States his pain eventually wore off. BP was elevated at 200/104.  Patient's initial evaluation was started in the ED but patient did not stay he left the ED AMA. Overall labs were stable.  Troponin level was slightly elevated at 22 and EKG unchanged from prior. He states pain is different than when he had blockage before. Suspect this was more reflux.   Since that time he has had no further chest pain. In October he fell 16 feet off a ladder and had bilateral ankle sprain. Low back pain. Followed by ortho. Hasn't been able to exercise much. Tolerating Praluent well. Just concerned about cost.    Past Medical History:  Diagnosis Date   Arthritis    Cancer ([place])    SKIN CA  SQUAMOUS CELL   Coronary artery disease    a. 2007 Stent to LAD, PTCA D1;  b. DES to LAD & LCx 10/13 in the setting of STEMI.   Dyslipidemia    a. Statin and zetia-intolerant. On niacin.   GERD (gastroesophageal reflux disease)    Heart murmur    Hyperlipidemia    Phreesia 11/22/2020   Hypertension    Hypertrophic cardiomyopathy ([place])    a. 09/2012 Echo: EF 55%, mid to apical anterolateral, posterior, apical anterior, apical HK, Gr1 DD, turbulence across LVOT suggesting degree of obstruction, mild MR, mildly dil LA.   Myocardial infarction ([place])    Phreesia 11/22/2020   Stroke [facility])    Phreesia 11/22/2020    Past Surgical History:  Procedure Laterality Date   CARDIAC CATHETERIZATION  2007   stent to LAD and PTCA of diagonal branch   CARDIAC CATHETERIZATION  2010   CORONARY ANGIOPLASTY WITH STENT PLACEMENT  2013   95-99% prox LAD, patent LAD stent distal to this, occluded mid-dist LCx, mild <10% RCA irreg; s/p DES-prox LAD & DES to mid-dist LCx   CORONARY BALLOON ANGIOPLASTY N/A 05/16/2017   Procedure: Coronary Balloon Angioplasty;  Surgeon: Leonie Man, MD;  Location: [place] [facility];  Service: Cardiovascular;  Laterality: N/A;   [place]   right   ICD IMPLANT N/A  03/10/2021   Procedure: ICD IMPLANT;  Surgeon: Evans Lance, MD;  Location: [place] [facility];  Service: Cardiovascular;  Laterality: N/A;   KNEE SURGERY  1993   left   LEFT HEART CATH Bilateral 09/22/2012   Procedure: LEFT HEART CATH;  Surgeon: Dudley Cooley M Martinique, MD;  Location: [facility] CATH LAB;  Service: Cardiovascular;  Laterality: Bilateral;   LEFT HEART CATH AND CORONARY ANGIOGRAPHY N/A 05/16/2017   Procedure: Left Heart Cath and Coronary Angiography;  Surgeon: Leonie Man, MD;  Location: [place] [facility];  Service: Cardiovascular;  Laterality: N/A;   PERCUTANEOUS  CORONARY STENT INTERVENTION (PCI-S) Right 09/22/2012   Procedure: PERCUTANEOUS CORONARY STENT INTERVENTION (PCI-S);  Surgeon: Revella Shelton M Martinique, MD;  Location: [facility] CATH LAB;  Service: Cardiovascular;  Laterality: Right;     Current Outpatient Medications  Medication Sig Dispense Refill   Alirocumab (PRALUENT) 150 MG/ML SOAJ Inject 1 Dose into the skin every 14 (fourteen) days. 2 mL 11   amLODipine (NORVASC) 2.5 MG tablet TAKE 1 TABLET BY MOUTH EVERY DAY 90 tablet 2   carvedilol (COREG) 12.5 MG tablet TAKE 0.5 TABLETS (6.25 MG TOTAL) BY MOUTH 2 (TWO) TIMES DAILY. 90 tablet 3   clopidogrel (PLAVIX) 75 MG tablet TAKE 1 TABLET BY MOUTH EVERY DAY 90 tablet 3   esomeprazole (NEXIUM) 20 MG capsule Take 20 mg by mouth every morning.     lisinopril (ZESTRIL) 40 MG tablet TAKE 1 TABLET BY MOUTH EVERY DAY 90 tablet 3   nitroGLYCERIN (NITROSTAT) 0.4 MG SL tablet Place 0.4 mg under the tongue every 5 (five) minutes as needed for chest pain.     valACYclovir (VALTREX) 500 MG tablet Take 1 tablet (500 mg total) by mouth daily as needed (fever blisters). 90 tablet 3   No current facility-administered medications for this visit.    Allergies:   Antihistamines, chlorpheniramine-type; Pheniramine; Statins; and Zetia [ezetimibe]    Social History:  The patient  reports that he has never smoked. He has never used smokeless tobacco. He reports  that he does not drink alcohol and does not use drugs.   Family History:  The patient's family history includes Heart attack (age of onset: 51) in his mother; Heart disease in his mother.    ROS:  Please see the history of present illness.   Otherwise, review of systems are positive for none.   All other systems are reviewed and negative.    PHYSICAL EXAM: VS:  BP 120/78 (BP Location: Left Arm, Patient Position: Sitting, Cuff Size: Normal)   Pulse 68   Resp 20   Ht 5\' 8"  (1.727 m)   Wt 175 lb (79.4 kg)   SpO2 98%   BMI 26.61 kg/m  , BMI Body mass index is 26.61 kg/m. GEN: Well nourished, well developed, in no acute distress  HEENT: normal  Neck: no JVD, carotid bruits, or masses Cardiac: RRR;  rubs, or gallops,no edema. Harsh gr 1-3/2 systolic murmur LSB and apex. Respiratory:  clear to auscultation bilaterally, normal work of breathing GI: soft, nontender, nondistended, + BS MS: no deformity or atrophy  Skin: warm and dry, no rash Neuro:  Strength and sensation are intact Psych: euthymic mood, full affect   EKG:  EKG is not ordered today. The ekg ordered today demonstrates N/A   Recent Labs: 08/19/2021: BUN 23; Creatinine, Ser 1.48; Hemoglobin 15.5; Platelets 155; Potassium 5.0; Sodium 143    Lipid Panel    Component Value Date/Time   CHOL 111 11/09/2021 1106   TRIG 87 11/09/2021 1106   HDL 40 11/09/2021 1106   CHOLHDL 2.8 11/09/2021 1106   CHOLHDL 7.7 09/16/2020 0428   VLDL 48 (H) 09/16/2020 0428   LDLCALC 54 11/09/2021 1106   LDLDIRECT 159.5 10/01/2012 0847      Wt Readings from Last 3 Encounters:  11/15/21 175 lb (79.4 kg)  09/02/21 175 lb (79.4 kg)  06/21/21 177 lb (80.3 kg)      Other studies Reviewed: Additional studies/ records that were reviewed today include:   Cardiac cath 05/16/17:  Coronary Balloon Angioplasty  Left Heart Cath and Coronary Angiography  Conclusion    Ost RCA lesion, 40 %stenosed. Mid RCA lesion, 65 %stenosed. Borderline  significant. The left ventricular systolic function is normal. LV end diastolic pressure is mildly elevated. There is no mitral valve regurgitation. There is no aortic valve stenosis. Prox LAD to Mid LAD bare-metal stent, 20 %stenosed. Mid LAD lesion, 40 %stenosed. Localized segment of previous bare-metal stent- 2nd Mrg DES stent, 0 %stenosed. Prox Cx to Mid Cx lesion, 40 %stenosed. Ost LAD lesion, 95 %stenosed. Post intervention, there is a 10% residual stenosis.   Mr. Aston has several potential culprit lesions, however the most obvious one in the LAD with 95% stenosis. This is in-stent restenosis, therefore treated with balloon angioplasty using the [facility] Cutting Balloon followed by post-dilation with a noncompliant balloon.   The RCA lesion is of borderline significance, but does not appear to be acute in nature. Would defer intervention on this vessel at this time unless she has ongoing or worsening symptoms.     Plan: Overnight monitoring and likely discharge tomorrow Return to nursing unit for ongoing care and TR been removal. Back on dual antiplatelet therapy.- Reduced aspirin 81 mg him and using Brilinta. Further risk factor modification per rounding team.     He can follow-up with Dr. Martinique, will need to have a follow-up appointment rescheduled as he was post be there tomorrow June 14.       Glenetta Hew, M.D., M.S. Interventional Cardiologist      Echo 07/14/19: IMPRESSIONS     1. The left ventricle has normal systolic function with an ejection  fraction of 60-65%. The cavity size was normal. Severe basal septal  hypertrophy and mild concentric hypertrophy. Left ventricular diastolic  Doppler parameters are consistent with  pseudonormalization. Elevated left ventricular end-diastolic pressure.   2. The average left ventricular global longitudinal strain is -16.0 %.   3. The right ventricle has normal systolic function. The cavity was  normal. There is no  increase in right ventricular wall thickness.   4. Left atrial size was mildly dilated.   5. The aortic valve is tricuspid. Moderate sclerosis of the aortic valve.  Aortic valve regurgitation is mild by color flow Doppler.   6. The aorta is normal in size and structure.   Cardiac MRI 02/25/20: CLINICAL DATA:  Hypertrophic cardiomyopathy suspected, further testing   COMPARISON: Echocardiogram 07/14/2019   EXAM: CARDIAC MRI   TECHNIQUE: The patient was scanned on a 1.5 Tesla GE magnet. A dedicated cardiac coil was used. Functional imaging was done using Fiesta sequences. 2,3, and 4 chamber views were done to assess for RWMA's. Modified Simpson's rule using a short axis stack was used to calculate an ejection fraction on a dedicated work Conservation officer, nature. The patient received 59mL GADAVIST GADOBUTROL 1 MMOL/ML IV SOLN. After 10 minutes inversion recovery sequences were used to assess for infiltration and scar tissue.   CONTRAST:  5mL GADAVIST GADOBUTROL 1 MMOL/ML IV SOLN   FINDINGS: LEFT VENTRICLE:   Normal LV chamber size.   Wall thickness is increased in the basal septum as described below. Wall thinning in the anterior wall and inferolateral wall. The lateral wall demonstrates scar and is significantly thinned.   Akinesis of the inferolateral wall from base to mid ventricle, and hypokinesis of lateral apex. Hypokinesis of the anterior wall from base to apex.   LVEF = 52%   Maximal wall thickness: 18 mm   Location: Basal anteroseptum   There is systolic anterior motion of the mitral valve.  Flow dephasing suggests left ventricular outflow tract obstruction. Mitral regurgitation is seen, posteriorly directed, eccentric and qualitatively mild-moderate.   No evidence of left ventricular apical aneurysm.   Findings are consistent with hypertrophic cardiomyopathy with obstruction. Morphologic subtype: Sigmoid.   There is late gadolinium enhancement in the  left ventricular myocardium.   LGE noted subendocardium of the inferolateral and the inferior portion of the anterolateral wall from base to mid ventricle, including a mildly aneurysmal appearing inferolateral mid ventricular segment. Delayed myocardial enhancement is <50% of the thickness of the lateral wall, suggesting viability in this coronary distribution.   There is also LGE in the subendocardium of the anterior wall from base to mid ventricle. Delayed myocardial enhancement is <50% of the thickness of the anterior wall, suggesting viability in this coronary distribution.   Mild delayed myocardial enhancement noted at the RV insertion points. This represents <1% of the myocardial mass. Quantitation of LGE not performed in the setting of prominent delayed gadolinium enhancement from ischemic heart disease.   Normal T1 myocardial nulling kinetics suggests against a diagnosis of cardiac amyloidosis.   ECV = 22%, normal.   RIGHT VENTRICLE:   Normal right ventricular size, thickness and systolic function (RVEF =16%). There are no regional wall motion abnormalities.   ATRIA:   Mild left atrial enlargement.  Normal right atrial chamber size.   VALVES:   Mild-moderate mitral valve regurgitation. Due to systolic anterior motion by flow dephasing.   Trivial aortic valve regurgitation by flow dephasing.   PERICARDIUM:   Normal pericardium.  Trace circumferential pericardial effusion.   OTHER: No significant extracardiac findings noted.   MEASUREMENTS:   LVEDV: 174 mL   LVESV: 83 mL   SV: 91 mL   CO: 5.5 L/min   Myocardial mass: 160 g   RVEDV: 121 mL   RVEDS: 49 mL   RVSV: 72 mL   IMPRESSION: 1.  Normal left ventricular size, LVEF 52%.   2. Akinesis of the basal-mid inferolateral wall with scar and wall thinning. Hypokinesis of the basal-apical anterior wall. Subendocardial delayed enhancement that is <50% thickness of the myocardium, suggestive of  ischemic heart disease with viability in these regions.   3. Hypertrophic cardiomyopathy, sigmoid subtype. 18 mm basal anteroseptum.   4. Left ventricular outflow tract obstruction with systolic anterior motion of the mitral valve and qualitatively mild-moderate mitral valve regurgitation.   5. Mild myocardial enhancement in the RV insertion points at the base. No significant delayed myocardial enhancement associated with area of myocardial thickening.   6. No evidence of cardiac amyloidosis. Normal extracellular volume, 22%.   7.  Normal right ventricular size and function, RVEF 60%   8.  Trace pericardial effusion.     Electronically Signed   By: Cherlynn Kaiser   On: 02/29/2020 18:43   Echo 09/16/20: IMPRESSIONS     1. Left ventricular ejection fraction, by estimation, is 65 to 70%. The  left ventricle has normal function. The left ventricle has no regional  wall motion abnormalities. There is moderate concentric left ventricular  hypertrophy. Left ventricular  diastolic parameters are consistent with Grade I diastolic dysfunction  (impaired relaxation).   2. Right ventricular systolic function is normal. The right ventricular  size is normal.   3. Left atrial size was moderately dilated.   4. The mitral valve is normal in structure. Trivial mitral valve  regurgitation. No evidence of mitral stenosis.   5. The aortic valve is normal in structure. There is mild calcification  of  the aortic valve. There is mild thickening of the aortic valve. Aortic  valve regurgitation is mild. No aortic stenosis is present.   6. The inferior vena cava is normal in size with greater than 50%  respiratory variability, suggesting right atrial pressure of 3 mmHg.   Conclusion(s)/Recommendation(s): No intracardiac source of embolism  detected on this transthoracic study. A transesophageal echocardiogram is  recommended to exclude cardiac source of embolism if clinically indicated.    ASSESSMENT AND PLAN:    1.Hypertrophic CM:  euvolemic on exam.  Most recent Echo in Oct 2021 showed no significant LVOT gradient.  Moderate LVH and normal EF. Late gadolinium enhancement. He has NSVT noted on monitor. Now s/p ICD implant.   2.  CAD: s/p prior LAD, D1, and LCX PCI with STEMI in 2013. Admitted in June 2018 with in stent restenosis of the proximal LAD. Symptoms predominantly of DOE. Treated with cutting balloon PCI. Interestedly his Myoview study prior was normal so may not be a reliable study.  Currently without angina. Continue medical therapy   3.  HL:   intolerant to statins and zetia. On CLEAR trial now. Last LDL 155. Now on Praluent. Excellent response with LDL 54. Has appointment with Dr Debara Pickett in February.   4.  Hypertension:  BP is well controlled.    5.  S/p CVA October 2021. Likely related to embolus nonobstructive but extensive carotid arterial disease.    .      Current medicines are reviewed at length with the patient today.  The patient does not have concerns regarding medicines.  The following changes have been made:  no change  Labs/ tests ordered today include:  No orders of the defined types were placed in this encounter.    Disposition:   FU with me in 6 months.  Signed, Adriella Essex Martinique, MD  11/15/2021 2:50 PM    [facility] [address] [place], [place], [address] Phone [phone], Fax [phone]

## 2021-11-15 ENCOUNTER — Other Ambulatory Visit: Payer: Self-pay

## 2021-11-15 ENCOUNTER — Encounter: Payer: Self-pay | Admitting: Cardiology

## 2021-11-15 ENCOUNTER — Ambulatory Visit: Payer: Medicare HMO | Admitting: Cardiology

## 2021-11-15 VITALS — BP 120/78 | HR 68 | Resp 20 | Ht 68.0 in | Wt 175.0 lb

## 2021-11-15 DIAGNOSIS — I2510 Atherosclerotic heart disease of native coronary artery without angina pectoris: Secondary | ICD-10-CM

## 2021-11-15 DIAGNOSIS — I422 Other hypertrophic cardiomyopathy: Secondary | ICD-10-CM | POA: Diagnosis not present

## 2021-11-15 DIAGNOSIS — Z9861 Coronary angioplasty status: Secondary | ICD-10-CM

## 2021-11-15 DIAGNOSIS — I4729 Other ventricular tachycardia: Secondary | ICD-10-CM | POA: Diagnosis not present

## 2021-11-15 DIAGNOSIS — E785 Hyperlipidemia, unspecified: Secondary | ICD-10-CM

## 2021-11-15 DIAGNOSIS — Z95810 Presence of automatic (implantable) cardiac defibrillator: Secondary | ICD-10-CM

## 2021-11-16 DIAGNOSIS — S93492A Sprain of other ligament of left ankle, initial encounter: Secondary | ICD-10-CM | POA: Diagnosis not present

## 2021-11-16 DIAGNOSIS — S93409A Sprain of unspecified ligament of unspecified ankle, initial encounter: Secondary | ICD-10-CM | POA: Insufficient documentation

## 2021-11-16 DIAGNOSIS — S93491A Sprain of other ligament of right ankle, initial encounter: Secondary | ICD-10-CM | POA: Diagnosis not present

## 2021-11-16 DIAGNOSIS — S93401A Sprain of unspecified ligament of right ankle, initial encounter: Secondary | ICD-10-CM | POA: Insufficient documentation

## 2021-11-16 DIAGNOSIS — M25572 Pain in left ankle and joints of left foot: Secondary | ICD-10-CM | POA: Diagnosis not present

## 2021-11-16 DIAGNOSIS — M25571 Pain in right ankle and joints of right foot: Secondary | ICD-10-CM | POA: Diagnosis not present

## 2021-11-24 ENCOUNTER — Encounter: Payer: Self-pay | Admitting: Internal Medicine

## 2021-11-30 ENCOUNTER — Telehealth: Payer: Self-pay

## 2021-11-30 NOTE — Telephone Encounter (Signed)
The patient currently has access to the requested medication and a Prior Authorization is not needed for the patient/medication. CMM Key: [id number]

## 2021-12-06 DIAGNOSIS — M25571 Pain in right ankle and joints of right foot: Secondary | ICD-10-CM | POA: Diagnosis not present

## 2021-12-06 DIAGNOSIS — M25572 Pain in left ankle and joints of left foot: Secondary | ICD-10-CM | POA: Diagnosis not present

## 2021-12-09 ENCOUNTER — Ambulatory Visit (INDEPENDENT_AMBULATORY_CARE_PROVIDER_SITE_OTHER): Payer: Medicare HMO

## 2021-12-09 DIAGNOSIS — I4729 Other ventricular tachycardia: Secondary | ICD-10-CM | POA: Diagnosis not present

## 2021-12-12 LAB — CUP PACEART REMOTE DEVICE CHECK
Battery Remaining Longevity: 94 mo
Battery Remaining Percentage: 89 %
Battery Voltage: 3.02 V
Brady Statistic AP VP Percent: 1 %
Brady Statistic AP VS Percent: 51 %
Brady Statistic AS VP Percent: 1 %
Brady Statistic AS VS Percent: 48 %
Brady Statistic RA Percent Paced: 50 %
Brady Statistic RV Percent Paced: 1 %
Date Time Interrogation Session: 20230106010126
HighPow Impedance: 70 Ohm
Implantable Lead Implant Date: 20220407
Implantable Lead Implant Date: 20220407
Implantable Lead Location: 753859
Implantable Lead Location: 753860
Implantable Lead Model: 7122
Implantable Pulse Generator Implant Date: 20220407
Lead Channel Impedance Value: 400 Ohm
Lead Channel Impedance Value: 590 Ohm
Lead Channel Pacing Threshold Amplitude: 0.75 V
Lead Channel Pacing Threshold Amplitude: 0.75 V
Lead Channel Pacing Threshold Pulse Width: 0.5 ms
Lead Channel Pacing Threshold Pulse Width: 0.5 ms
Lead Channel Sensing Intrinsic Amplitude: 1.4 mV
Lead Channel Sensing Intrinsic Amplitude: 11.8 mV
Lead Channel Setting Pacing Amplitude: 2 V
Lead Channel Setting Pacing Amplitude: 2.5 V
Lead Channel Setting Pacing Pulse Width: 0.5 ms
Lead Channel Setting Sensing Sensitivity: 0.5 mV
Pulse Gen Serial Number: 810023633

## 2021-12-20 NOTE — Progress Notes (Signed)
Remote ICD transmission.   

## 2021-12-27 ENCOUNTER — Telehealth: Payer: Medicare HMO | Admitting: Physician Assistant

## 2021-12-27 DIAGNOSIS — J019 Acute sinusitis, unspecified: Secondary | ICD-10-CM

## 2021-12-27 DIAGNOSIS — B9689 Other specified bacterial agents as the cause of diseases classified elsewhere: Secondary | ICD-10-CM

## 2021-12-27 MED ORDER — DOXYCYCLINE HYCLATE 100 MG PO TABS
100.0000 mg | ORAL_TABLET | Freq: Two times a day (BID) | ORAL | 0 refills | Status: DC
Start: 2021-12-27 — End: 2022-01-25

## 2021-12-27 NOTE — Progress Notes (Signed)

## 2022-01-05 ENCOUNTER — Other Ambulatory Visit: Payer: Self-pay | Admitting: Cardiology

## 2022-01-23 ENCOUNTER — Telehealth: Payer: Medicare HMO | Admitting: Physician Assistant

## 2022-01-23 ENCOUNTER — Telehealth: Payer: Self-pay | Admitting: Internal Medicine

## 2022-01-23 DIAGNOSIS — R6889 Other general symptoms and signs: Secondary | ICD-10-CM

## 2022-01-23 MED ORDER — OSELTAMIVIR PHOSPHATE 30 MG PO CAPS: 30.0000 mg | ORAL_CAPSULE | Freq: Two times a day (BID) | ORAL | 0 refills | Status: DC

## 2022-01-23 NOTE — Progress Notes (Signed)
I have spent 5 minutes in review of e-visit questionnaire, review and updating patient chart, medical decision making and response to patient.   Alianna Wurster Cody [employer]se [place], PA-C    

## 2022-01-23 NOTE — Progress Notes (Signed)
E visit for Flu like symptoms   We are sorry that you are not feeling well.  Here is how we plan to help!  You should absolutely take a home COVID test giving your medical history to make sure this is negative. If positive, please let us know so we can guide you further.   If negative we will treat for suspected influenza as noted below.    Influenza or the flu is   an infection caused by a respiratory virus. The flu virus is highly contagious and persons who did not receive their yearly flu vaccination may catch the flu from close contact.  We have anti-viral medications to treat the viruses that cause this infection. They are not a cure and only shorten the course of the infection. These prescriptions are most effective when they are given within the first 2 days of flu symptoms. Antiviral medication are indicated if you have a high risk of complications from the flu. You should  also consider an antiviral medication if you are in close contact with someone who is at risk. These medications can help patients avoid complications from the flu  but have side effects that you should know. Possible side effects from Tamiflu or oseltamivir include nausea, vomiting, diarrhea, dizziness, headaches, eye redness, sleep problems or other respiratory symptoms. You should not take Tamiflu if you have an allergy to oseltamivir or any to the ingredients in Tamiflu.  Based upon your symptoms and potential risk factors I have prescribed Oseltamivir (Tamiflu).  It has been sent to your designated pharmacy.  You will take one 30 mg capsule orally twice a day for the next 5 days.  ANYONE WHO HAS FLU SYMPTOMS SHOULD: Stay home. The flu is highly contagious and going out or to work exposes others! Be sure to drink plenty of fluids. Water is fine as well as fruit juices, sodas and electrolyte beverages. You may want to stay away from caffeine or alcohol. If you are nauseated, try taking small sips of liquids.  How do you know if you are getting enough fluid? Your urine should be a pale yellow or almost colorless. Get rest. Taking a steamy shower or using a humidifier may help nasal congestion and ease sore throat pain. Using a saline nasal spray works much the same way. Cough drops, hard candies and sore throat lozenges may ease your cough. Line up a caregiver. Have someone check on you regularly.   GET HELP RIGHT AWAY IF: You cannot keep down liquids or your medications. You become short of breath Your fell like you are going to pass out or loose consciousness. Your symptoms persist after you have completed your treatment plan MAKE SURE YOU  Understand these instructions. Will watch your condition. Will get help right away if you are not doing well or get worse.  Your e-visit answers were reviewed by a board certified advanced clinical practitioner to complete your personal care plan.  Depending on the condition, your plan could have included both over the counter or prescription medications.  If there is a problem please reply  once you have received a response from your provider.  Your safety is important to [place].  If you have drug allergies check your prescription carefully.    You can use MyChart to ask questions about todays visit, request a non-urgent call back, or ask for a work or school excuse for 24 hours related to this e-Visit. If it has been greater than 24 hours you will  need to follow up with your provider, or enter a new e-Visit to address those concerns.  You will get an e-mail in the next two days asking about your experience.  I hope that your e-visit has been valuable and will speed your recovery. Thank you for using e-visits.

## 2022-01-23 NOTE — Telephone Encounter (Signed)
Patient states he woke up with flu like symptoms and would like to know if he can have his appointment 2/22 virtually.

## 2022-01-23 NOTE — Telephone Encounter (Signed)
Spoke with pt, aware appointment has been change to virtual visit.

## 2022-01-25 ENCOUNTER — Other Ambulatory Visit: Payer: Self-pay

## 2022-01-25 ENCOUNTER — Telehealth: Payer: Medicare HMO | Admitting: Internal Medicine

## 2022-01-25 ENCOUNTER — Encounter: Payer: Self-pay | Admitting: Internal Medicine

## 2022-01-25 VITALS — BP 130/89 | HR 74 | Ht 68.05 in | Wt 167.0 lb

## 2022-01-25 DIAGNOSIS — Z9861 Coronary angioplasty status: Secondary | ICD-10-CM | POA: Diagnosis not present

## 2022-01-25 DIAGNOSIS — T466X5A Adverse effect of antihyperlipidemic and antiarteriosclerotic drugs, initial encounter: Secondary | ICD-10-CM

## 2022-01-25 DIAGNOSIS — E785 Hyperlipidemia, unspecified: Secondary | ICD-10-CM | POA: Diagnosis not present

## 2022-01-25 DIAGNOSIS — M791 Myalgia, unspecified site: Secondary | ICD-10-CM

## 2022-01-25 DIAGNOSIS — I2510 Atherosclerotic heart disease of native coronary artery without angina pectoris: Secondary | ICD-10-CM | POA: Diagnosis not present

## 2022-01-25 DIAGNOSIS — T466X5D Adverse effect of antihyperlipidemic and antiarteriosclerotic drugs, subsequent encounter: Secondary | ICD-10-CM | POA: Diagnosis not present

## 2022-01-25 DIAGNOSIS — Z8673 Personal history of transient ischemic attack (TIA), and cerebral infarction without residual deficits: Secondary | ICD-10-CM | POA: Diagnosis not present

## 2022-01-25 MED ORDER — OSELTAMIVIR PHOSPHATE 30 MG PO CAPS: 30.0000 mg | ORAL_CAPSULE | Freq: Two times a day (BID) | ORAL | 0 refills | Status: DC

## 2022-01-25 NOTE — Patient Instructions (Signed)
Medication Instructions:  Your physician recommends that you continue on your current medications as directed. Please refer to the Current Medication list given to you today.  *If you need a refill on your cardiac medications before your next appointment, please call your pharmacy*   Lab Work: FASTING lab work to check cholesterol in about 6 months -- before your next appointment   If you have labs (blood work) drawn today and your tests are completely normal, you will receive your results only by: [place] (if you have MyChart) OR A paper copy in the mail If you have any lab test that is abnormal or we need to change your treatment, we will call you to review the results.  Follow-Up: At [facility], you and your health needs are our priority.  As part of our continuing mission to provide you with exceptional heart care, we have created designated Provider Care Teams.  These Care Teams include your primary Cardiologist (physician) and Advanced Practice Providers (APPs -  Physician Assistants and Nurse Practitioners) who all work together to provide you with the care you need, when you need it.  We recommend signing up for the patient portal called "MyChart".  Sign up information is provided on this After Visit Summary.  MyChart is used to connect with patients for Virtual Visits (Telemedicine).  Patients are able to view lab/test results, encounter notes, upcoming appointments, etc.  Non-urgent messages can be sent to your provider as well.   To learn more about what you can do with MyChart, go to [email or web address].    Your next appointment:   6 month LIPID CLINIC appointment with Dr. Debara Pickett

## 2022-01-25 NOTE — Progress Notes (Signed)
Virtual Visit via Video Note   This visit type was conducted due to national recommendations for restrictions regarding the COVID-19 Pandemic (e.g. social distancing) in an effort to limit this patient's exposure and mitigate transmission in our community.  Due to his co-morbid illnesses, this patient is at least at moderate risk for complications without adequate follow up.  This format is felt to be most appropriate for this patient at this time.  All issues noted in this document were discussed and addressed.  A limited physical exam was performed with this format.  Please refer to the patient's chart for his consent to telehealth for [facility].      Date:  01/25/2022   ID:  Brandon Gonzales, DOB [birth year], MRN [id number] The patient was identified using 2 identifiers.  Evaluation Performed:  New Patient Evaluation  Patient Location:  [address] [place] [address]  Provider location:   [address], [place] [address] [place], [place] [address]  PCP:  Nicolette Bang, MD  Cardiologist:  Peter Martinique, MD Electrophysiologist:  Cristopher Peru, MD   Chief Complaint:  Manage dyslipidemia  History of Present Illness:    Brandon Gonzales is a 71 y.o. male who presents via audio/video conferencing for a telehealth visit today.  This is a pleasant 71 year old male with a complex cardiac history including coronary artery disease with stents to the LAD and circumflex with prior STEMI, hypertrophic cardiomyopathy who recently had an ICD placed, prior myocardial infarction, dyslipidemia, hypertension and stroke.  Unfortunately, he has been intolerant to numerous statins including rosuvastatin, atorvastatin and ezetimibe, all causing severe leg cramps.  He was referred to the research foundation and was enrolled in the CLEAR-outcomes trial, however he was randomized to placebo and therefore had no benefit from bempedoic acid.  He just completed the trial and was referred by his  primary care provider for options for management of his dyslipidemia.  His most recent lipid profile was in October however appears to be fairly stable with his other previous studies.  This demonstrated total cholesterol 208, triglycerides 238, HDL 27 and LDL of 133.  His target LDL is less than 70.  He says despite an excellent diet and work at trying to lower his cholesterol, he has not been able to get his LDL to target.  01/26/2022  Brandon Gonzales is seen today for follow-up.  He is doing well on Praluent.  He had a marked reduction in his lipids.  Total cholesterol now 111 (down from 210), triglycerides 87, HDL 40 and LDL 54, down from 155.  He is also tolerating the medicine very well.  Unfortunately he developed influenza.  He is now going to start on some Tamiflu and this is why we did a virtual visit today.  His only other concern was the cost of the medication.  He says he does not think he will qualify for need based grant.  Cost is gone up significantly though.  I believe he said $900 for 33-month supply  The patient does not have symptoms concerning for COVID-19 infection (fever, chills, cough, or new SHORTNESS OF BREATH).    Prior CV studies:   The following studies were reviewed today:  Chart reviewed, lab work  PMHx:  Past Medical History:  Diagnosis Date   Arthritis    Cancer ([place])    SKIN CA  SQUAMOUS CELL   Coronary artery disease    a. 2007 Stent to LAD, PTCA D1;  b. DES to LAD &  LCx 10/13 in the setting of STEMI.   Dyslipidemia    a. Statin and zetia-intolerant. On niacin.   GERD (gastroesophageal reflux disease)    Heart murmur    Hyperlipidemia    Phreesia 11/22/2020   Hypertension    Hypertrophic cardiomyopathy ([place])    a. 09/2012 Echo: EF 55%, mid to apical anterolateral, posterior, apical anterior, apical HK, Gr1 DD, turbulence across LVOT suggesting degree of obstruction, mild MR, mildly dil LA.   Myocardial infarction ([place])    Phreesia 11/22/2020   Stroke [facility])     Phreesia 11/22/2020    Past Surgical History:  Procedure Laterality Date   CARDIAC CATHETERIZATION  2007   stent to LAD and PTCA of diagonal branch   CARDIAC CATHETERIZATION  2010   CORONARY ANGIOPLASTY WITH STENT PLACEMENT  2013   95-99% prox LAD, patent LAD stent distal to this, occluded mid-dist LCx, mild <10% RCA irreg; s/p DES-prox LAD & DES to mid-dist LCx   CORONARY BALLOON ANGIOPLASTY N/A 05/16/2017   Procedure: Coronary Balloon Angioplasty;  Surgeon: Leonie Man, MD;  Location: [place] CV LAB;  Service: Cardiovascular;  Laterality: N/A;   [place]   right   ICD IMPLANT N/A 03/10/2021   Procedure: ICD IMPLANT;  Surgeon: Evans Lance, MD;  Location: [place] CV LAB;  Service: Cardiovascular;  Laterality: N/A;   KNEE SURGERY  1993   left   LEFT HEART CATH Bilateral 09/22/2012   Procedure: LEFT HEART CATH;  Surgeon: Peter M Martinique, MD;  Location: [facility] CATH LAB;  Service: Cardiovascular;  Laterality: Bilateral;   LEFT HEART CATH AND CORONARY ANGIOGRAPHY N/A 05/16/2017   Procedure: Left Heart Cath and Coronary Angiography;  Surgeon: Leonie Man, MD;  Location: [place] CV LAB;  Service: Cardiovascular;  Laterality: N/A;   PERCUTANEOUS CORONARY STENT INTERVENTION (PCI-S) Right 09/22/2012   Procedure: PERCUTANEOUS CORONARY STENT INTERVENTION (PCI-S);  Surgeon: Peter M Martinique, MD;  Location: [facility] CATH LAB;  Service: Cardiovascular;  Laterality: Right;    FAMHx:  Family History  Problem Relation Age of Onset   Heart attack Mother 62   Heart disease Mother     SOCHx:   reports that he has never smoked. He has never used smokeless tobacco. He reports that he does not drink alcohol and does not use drugs.  ALLERGIES:  Allergies  Allergen Reactions   Antihistamines, Chlorpheniramine-Type Other (See Comments)    Altered mental status   Pheniramine Other (See Comments)    Altered mental status   Statins Other (See Comments)    Leg cramps     Zetia  [Ezetimibe] Other (See Comments)    Leg cramps    MEDS:  Current Meds  Medication Sig   Alirocumab (PRALUENT) 150 MG/ML SOAJ Inject 1 Dose into the skin every 14 (fourteen) days.   amLODipine (NORVASC) 2.5 MG tablet TAKE 1 TABLET BY MOUTH EVERY DAY   carvedilol (COREG) 12.5 MG tablet TAKE 0.5 TABLETS (6.25 MG TOTAL) BY MOUTH 2 (TWO) TIMES DAILY.   clopidogrel (PLAVIX) 75 MG tablet TAKE 1 TABLET BY MOUTH EVERY DAY   esomeprazole (NEXIUM) 20 MG capsule Take 20 mg by mouth every morning.   lisinopril (ZESTRIL) 40 MG tablet TAKE 1 TABLET BY MOUTH EVERY DAY   nitroGLYCERIN (NITROSTAT) 0.4 MG SL tablet Place 0.4 mg under the tongue every 5 (five) minutes as needed for chest pain.   valACYclovir (VALTREX) 500 MG tablet Take 1 tablet (500 mg total) by mouth daily  as needed (fever blisters).     ROS: Pertinent items noted in HPI and remainder of comprehensive ROS otherwise negative.  Labs/Other Tests and Data Reviewed:    Recent Labs: 08/19/2021: BUN 23; Creatinine, Ser 1.48; Hemoglobin 15.5; Platelets 155; Potassium 5.0; Sodium 143   Recent Lipid Panel Lab Results  Component Value Date/Time   CHOL 111 11/09/2021 11:06 AM   TRIG 87 11/09/2021 11:06 AM   HDL 40 11/09/2021 11:06 AM   CHOLHDL 2.8 11/09/2021 11:06 AM   CHOLHDL 7.7 09/16/2020 04:28 AM   LDLCALC 54 11/09/2021 11:06 AM   LDLDIRECT 159.5 10/01/2012 08:47 AM    Wt Readings from Last 3 Encounters:  01/25/22 167 lb (75.8 kg)  11/15/21 175 lb (79.4 kg)  09/02/21 175 lb (79.4 kg)     Exam:    Vital Signs:  BP 130/89    Pulse 74    Ht 5' 8.05" (1.728 m)    Wt 167 lb (75.8 kg)    SpO2 97%    BMI 25.35 kg/m    General appearance: alert, appears stated age, and no distress Lungs: No visual respiratory difficulty Abdomen: Normal weight Extremities: extremities normal, atraumatic, no cyanosis or edema Skin: Skin color, texture, turgor normal. No rashes or lesions Neurologic: Grossly normal : Pleasant  ASSESSMENT & PLAN:     Mixed dyslipidemia, goal LDL less than 70 Coronary artery disease with prior STEMI and multiple PCI's Hypertrophic cardiomyopathy status post AICD Hypertension History of stroke Statin and ezetimibe intolerance-myalgias  Mr. Hunnicutt has had marked improvement in his lipids on Praluent.  We will continue with his current therapy.  The only issue may be cost but he is very well treated on it.  I think this would also reduce the risk of a stroke as well as coronary disease.  We will plan follow-up with repeat lipids in 6 months or sooner as necessary.  COVID-19 Education: The signs and symptoms of COVID-19 were discussed with the patient and how to seek care for testing (follow up with PCP or arrange E-visit).  The importance of social distancing was discussed today.  Patient Risk:   After full review of this patients clinical status, I feel that they are at least moderate risk at this time.  Time:   Today, I have spent 15 minutes with the patient with telehealth technology discussing dyslipidemia, statin intolerance, review of medical history.     Medication Adjustments/Labs and Tests Ordered: Current medicines are reviewed at length with the patient today.  Concerns regarding medicines are outlined above.   Tests Ordered: Orders Placed This Encounter  Procedures   Lipid panel     Medication Changes: No orders of the defined types were placed in this encounter.    Disposition:  in 6 month(s)  Pixie Casino, MD, [facility], [place] Director of the Advanced Lipid Disorders &  Cardiovascular Risk Reduction Clinic Diplomate of the American Board of Clinical Lipidology Attending Cardiologist  Direct Dial: [phone]   Fax: [phone]  Website:  [email or web address]  Pixie Casino, MD  01/25/2022 1:47 PM

## 2022-01-25 NOTE — Addendum Note (Signed)
Addended by: Brunetta Jeans on: 01/25/2022 12:34 PM   Modules accepted: Orders

## 2022-03-10 ENCOUNTER — Ambulatory Visit (INDEPENDENT_AMBULATORY_CARE_PROVIDER_SITE_OTHER): Payer: Medicare HMO

## 2022-03-10 DIAGNOSIS — I422 Other hypertrophic cardiomyopathy: Secondary | ICD-10-CM | POA: Diagnosis not present

## 2022-03-13 LAB — CUP PACEART REMOTE DEVICE CHECK
Battery Remaining Longevity: 91 mo
Battery Remaining Percentage: 87 %
Battery Voltage: 3.02 V
Brady Statistic AP VP Percent: 1 %
Brady Statistic AP VS Percent: 49 %
Brady Statistic AS VP Percent: 1 %
Brady Statistic AS VS Percent: 50 %
Brady Statistic RA Percent Paced: 47 %
Brady Statistic RV Percent Paced: 1 %
Date Time Interrogation Session: 20230407194905
HighPow Impedance: 65 Ohm
Implantable Lead Implant Date: 20220407
Implantable Lead Implant Date: 20220407
Implantable Lead Location: 753859
Implantable Lead Location: 753860
Implantable Lead Model: 7122
Implantable Pulse Generator Implant Date: 20220407
Lead Channel Impedance Value: 390 Ohm
Lead Channel Impedance Value: 590 Ohm
Lead Channel Pacing Threshold Amplitude: 0.75 V
Lead Channel Pacing Threshold Amplitude: 0.75 V
Lead Channel Pacing Threshold Pulse Width: 0.5 ms
Lead Channel Pacing Threshold Pulse Width: 0.5 ms
Lead Channel Sensing Intrinsic Amplitude: 1 mV
Lead Channel Sensing Intrinsic Amplitude: 11.8 mV
Lead Channel Setting Pacing Amplitude: 2 V
Lead Channel Setting Pacing Amplitude: 2.5 V
Lead Channel Setting Pacing Pulse Width: 0.5 ms
Lead Channel Setting Sensing Sensitivity: 0.5 mV
Pulse Gen Serial Number: 810023633

## 2022-03-27 NOTE — Progress Notes (Signed)
Remote ICD transmission.   

## 2022-04-03 DIAGNOSIS — L608 Other nail disorders: Secondary | ICD-10-CM | POA: Insufficient documentation

## 2022-04-03 DIAGNOSIS — M79644 Pain in right finger(s): Secondary | ICD-10-CM | POA: Diagnosis not present

## 2022-05-06 NOTE — Progress Notes (Signed)
Cardiology Office Note   Date:  05/10/2022   ID:  Brandon Gonzales, DOB 10/05/1951, MRN [id number]  PCP:  Nicolette Bang, MD  Cardiologist:   Providence Stivers Martinique, MD   Chief Complaint  Patient presents with   Follow-up    6 months.   Coronary Artery Disease   Palpitations        History of Present Illness: Brandon Gonzales is a 71 y.o. male who presents for follow up CAD and HCM. He has a PMH including CAD prior mid LAD stenting in 2007 with  PTCA to D1, followed by lateral STEMI and PCI/DES to the midLCX & proximalLAD in 2013. S/p cutting balloon PTCA of the LAD in June 2019 for in stent restenosis.  He also has a h/o HTN, HL, GERD, and hypertrophic cardiomyopathy.     He called on 02/19/17 with complaints of dyspnea and dizziness that he states were similar to when he had his MI. He felt very flushed in his face. He was referred to the ED.  BP recorded by EMS was 210/107. Came down to 159/87 in ED.  Ecg showed NSR with a RBBB- new in January 2018. No acute change. Delta troponin was normal. UA, BMET, BNP were all normal. He was discharged on amlodipine 5 mg daily.    He was evaluated with Echo in April 2018 showing moderate LVH with severe basal septal hypertrophy. No significant outflow gradient. Mild MR. Myoview study showed a small area of scar at the base of the lateral wall. No ischemia. EF 51%. He presented in June 2018 with symptoms of worsening chest pain and dyspnea on exertion. He underwent cardiac cath showing 95% in stent restenosis of the proximal LAD treated with cutting  balloon angioplasty. There was a 65% mid RCA stenosis and 50% in the mid LCx. He was enrolled in the CLEAR trial for hyperlipidemia given history of statin and Zetia intolerance.   The patient was seen in the office 12/17/2019 after an episode of near syncope a couple on New Year's Day.  They had just eaten and he was in his car with his daughter (who is a paramedic) driving a plate of food over to his  brother's house.  While driving he became weak and near syncopal.  He denied any diaphoresis, shortness of breath, or nausea and vomiting.  His daughter took his pulse and thought he might be in atrial fibrillation but if it was present it did not last long.  His symptoms resolved quickly.  He did have a recurrent episode the following 2 days but none since.    A 7 day ZIO monitor was done and showed episodic bradycardia down to the 40"s- usually early am.  He also had frequent PVCs and short runs of PSVT.  In addition he had runs of NSWCT that appear to be NSVT. The longest was 14 beats.  He has had no further episodes of near syncope or symptomatic tachycardia.  Cardiac MRI was ordered with results noted below. Given syncopal episode, wide complex tachycardia noted on monitor, HCM with late gadolinium uptake on MRI and relatively low EF 52% I recommended EP evaluation to see if ICD indicated. He initially deferred. Seen by Dr Lovena Le on 03/23/20. He did eventually undergo ICD implant in April 2022.   He was admitted in October 2021 with a stroke. Had right arm and 4th and 5th finger numbness and weakness. Multiple left MCA infarcts embolic secondary to unknown source.CTA head &  neck advanced atherosclerosis B ICA bifurcation and bulbs. B ICA 30% stenosis w/ pronounced irregularity. Proximal R Subclavian 50%. R VA origin 30%. L VA origin 50%. Mid to distal BA. No LVO. MRI  showed Punctate L primary motor cortec and L subcortical parietal white matter infarcts. MR CS multilevel CX spondylosis w/ moderate and severe foraminal stenosis / narrowing C4-11. Echo showed no cardioembolic source. He was seen by EP for consideration of loop recorder but after discussion he did agree to have ICD implant. Since ICD implant no Afib noted. He was subsequently started on Praluent for his hyperlipidemia.   He was seen in the ED on 08/23/21 with chest pain. States he had severe chest pain radiating into his neck bilateral and it  hurt to take a deep breath. This lasted a couple of hours at least. Did not change with sl Ntg x 1. States his pain eventually wore off. BP was elevated at 200/104.  Patient's initial evaluation was started in the ED but patient did not stay he left the ED AMA. Overall labs were stable.  Troponin level was slightly elevated at 22 and EKG unchanged from prior. He states pain is different than when he had blockage before. Suspect this was more reflux.   On follow up today he feels great. Is building a new house which is stressful. No chest pain or palpitations. Tolerating medication well. Noted 3 mild episodes of lightheadedness. No ICD discharges.    Past Medical History:  Diagnosis Date   Arthritis    Cancer ([place])    SKIN CA  SQUAMOUS CELL   Coronary artery disease    a. 2007 Stent to LAD, PTCA D1;  b. DES to LAD & LCx 10/13 in the setting of STEMI.   Dyslipidemia    a. Statin and zetia-intolerant. On niacin.   GERD (gastroesophageal reflux disease)    Heart murmur    Hyperlipidemia    Phreesia 11/22/2020   Hypertension    Hypertrophic cardiomyopathy ([place])    a. 09/2012 Echo: EF 55%, mid to apical anterolateral, posterior, apical anterior, apical HK, Gr1 DD, turbulence across LVOT suggesting degree of obstruction, mild MR, mildly dil LA.   Myocardial infarction ([place])    Phreesia 11/22/2020   Stroke [facility])    Phreesia 11/22/2020    Past Surgical History:  Procedure Laterality Date   CARDIAC CATHETERIZATION  2007   stent to LAD and PTCA of diagonal branch   CARDIAC CATHETERIZATION  2010   CORONARY ANGIOPLASTY WITH STENT PLACEMENT  2013   95-99% prox LAD, patent LAD stent distal to this, occluded mid-dist LCx, mild <10% RCA irreg; s/p DES-prox LAD & DES to mid-dist LCx   CORONARY BALLOON ANGIOPLASTY N/A 05/16/2017   Procedure: Coronary Balloon Angioplasty;  Surgeon: Leonie Man, MD;  Location: [place] [facility];  Service: Cardiovascular;  Laterality: N/A;   [place]    right   ICD IMPLANT N/A 03/10/2021   Procedure: ICD IMPLANT;  Surgeon: Evans Lance, MD;  Location: [place] [facility];  Service: Cardiovascular;  Laterality: N/A;   KNEE SURGERY  1993   left   LEFT HEART CATH Bilateral 09/22/2012   Procedure: LEFT HEART CATH;  Surgeon: Margel Joens M Martinique, MD;  Location: [facility] CATH LAB;  Service: Cardiovascular;  Laterality: Bilateral;   LEFT HEART CATH AND CORONARY ANGIOGRAPHY N/A 05/16/2017   Procedure: Left Heart Cath and Coronary Angiography;  Surgeon: Leonie Man, MD;  Location: [place] [facility];  Service: Cardiovascular;  Laterality: N/A;   PERCUTANEOUS CORONARY STENT INTERVENTION (PCI-S) Right 09/22/2012   Procedure: PERCUTANEOUS CORONARY STENT INTERVENTION (PCI-S);  Surgeon: Rechel Delosreyes M Martinique, MD;  Location: [facility] CATH LAB;  Service: Cardiovascular;  Laterality: Right;     Current Outpatient Medications  Medication Sig Dispense Refill   Alirocumab (PRALUENT) 150 MG/ML SOAJ Inject 1 Dose into the skin every 14 (fourteen) days. 2 mL 11   amLODipine (NORVASC) 2.5 MG tablet TAKE 1 TABLET BY MOUTH EVERY DAY 90 tablet 2   carvedilol (COREG) 12.5 MG tablet TAKE 0.5 TABLETS (6.25 MG TOTAL) BY MOUTH 2 (TWO) TIMES DAILY. 90 tablet 3   clopidogrel (PLAVIX) 75 MG tablet TAKE 1 TABLET BY MOUTH EVERY DAY 90 tablet 3   esomeprazole (NEXIUM) 20 MG capsule Take 20 mg by mouth every morning.     lisinopril (ZESTRIL) 40 MG tablet TAKE 1 TABLET BY MOUTH EVERY DAY 90 tablet 3   nitroGLYCERIN (NITROSTAT) 0.4 MG SL tablet Place 0.4 mg under the tongue every 5 (five) minutes as needed for chest pain.     oseltamivir (TAMIFLU) 30 MG capsule Take 1 capsule (30 mg total) by mouth 2 (two) times daily. 10 capsule 0   valACYclovir (VALTREX) 500 MG tablet Take 1 tablet (500 mg total) by mouth daily as needed (fever blisters). 90 tablet 3   No current facility-administered medications for this visit.    Allergies:   Antihistamines, chlorpheniramine-type; Pheniramine; Statins; and  Zetia [ezetimibe]    Social History:  The patient  reports that he has never smoked. He has never used smokeless tobacco. He reports that he does not drink alcohol and does not use drugs.   Family History:  The patient's family history includes Heart attack (age of onset: 40) in his mother; Heart disease in his mother.    ROS:  Please see the history of present illness.   Otherwise, review of systems are positive for none.   All other systems are reviewed and negative.    PHYSICAL EXAM: VS:  BP 130/86 (BP Location: Left Arm, Patient Position: Sitting, Cuff Size: Normal)   Pulse 65   Ht 5' 8.5" (1.74 m)   Wt 178 lb (80.7 kg)   BMI 26.67 kg/m  , BMI Body mass index is 26.67 kg/m. GEN: Well nourished, well developed, in no acute distress  HEENT: normal  Neck: no JVD, carotid bruits, or masses Cardiac: RRR;  rubs, or gallops,no edema. Harsh gr 2-6/9 systolic murmur LSB and apex. Respiratory:  clear to auscultation bilaterally, normal work of breathing GI: soft, nontender, nondistended, + BS MS: no deformity or atrophy  Skin: warm and dry, no rash Neuro:  Strength and sensation are intact Psych: euthymic mood, full affect   EKG:  EKG isordered today. The ekg ordered today demonstrates NSR with occ PVC. LAFB, RBBB. No change. I have personally reviewed and interpreted this study.    Recent Labs: 08/19/2021: BUN 23; Creatinine, Ser 1.48; Hemoglobin 15.5; Platelets 155; Potassium 5.0; Sodium 143    Lipid Panel    Component Value Date/Time   CHOL 111 11/09/2021 1106   TRIG 87 11/09/2021 1106   HDL 40 11/09/2021 1106   CHOLHDL 2.8 11/09/2021 1106   CHOLHDL 7.7 09/16/2020 0428   VLDL 48 (H) 09/16/2020 0428   LDLCALC 54 11/09/2021 1106   LDLDIRECT 159.5 10/01/2012 0847      Wt Readings from Last 3 Encounters:  05/10/22 178 lb (80.7 kg)  01/25/22 167 lb (75.8 kg)  11/15/21  175 lb (79.4 kg)      Other studies Reviewed: Additional studies/ records that were reviewed  today include:   Cardiac cath 05/16/17:  Coronary Balloon Angioplasty  Left Heart Cath and Coronary Angiography  Conclusion    Ost RCA lesion, 40 %stenosed. Mid RCA lesion, 65 %stenosed. Borderline significant. The left ventricular systolic function is normal. LV end diastolic pressure is mildly elevated. There is no mitral valve regurgitation. There is no aortic valve stenosis. Prox LAD to Mid LAD bare-metal stent, 20 %stenosed. Mid LAD lesion, 40 %stenosed. Localized segment of previous bare-metal stent- 2nd Mrg DES stent, 0 %stenosed. Prox Cx to Mid Cx lesion, 40 %stenosed. Ost LAD lesion, 95 %stenosed. Post intervention, there is a 10% residual stenosis.   Mr. Weldy has several potential culprit lesions, however the most obvious one in the LAD with 95% stenosis. This is in-stent restenosis, therefore treated with balloon angioplasty using the [facility] Cutting Balloon followed by post-dilation with a noncompliant balloon.   The RCA lesion is of borderline significance, but does not appear to be acute in nature. Would defer intervention on this vessel at this time unless she has ongoing or worsening symptoms.     Plan: Overnight monitoring and likely discharge tomorrow Return to nursing unit for ongoing care and TR been removal. Back on dual antiplatelet therapy.- Reduced aspirin 81 mg him and using Brilinta. Further risk factor modification per rounding team.     He can follow-up with Dr. Martinique, will need to have a follow-up appointment rescheduled as he was post be there tomorrow June 14.       Brandon Gonzales, M.D., M.S. Interventional Cardiologist      Echo 07/14/19: IMPRESSIONS     1. The left ventricle has normal systolic function with an ejection  fraction of 60-65%. The cavity size was normal. Severe basal septal  hypertrophy and mild concentric hypertrophy. Left ventricular diastolic  Doppler parameters are consistent with  pseudonormalization. Elevated left  ventricular end-diastolic pressure.   2. The average left ventricular global longitudinal strain is -16.0 %.   3. The right ventricle has normal systolic function. The cavity was  normal. There is no increase in right ventricular wall thickness.   4. Left atrial size was mildly dilated.   5. The aortic valve is tricuspid. Moderate sclerosis of the aortic valve.  Aortic valve regurgitation is mild by color flow Doppler.   6. The aorta is normal in size and structure.   Cardiac MRI 02/25/20: CLINICAL DATA:  Hypertrophic cardiomyopathy suspected, further testing   COMPARISON: Echocardiogram 07/14/2019   EXAM: CARDIAC MRI   TECHNIQUE: The patient was scanned on a 1.5 Tesla GE magnet. A dedicated cardiac coil was used. Functional imaging was done using Fiesta sequences. 2,3, and 4 chamber views were done to assess for RWMA's. Modified Simpson's rule using a short axis stack was used to calculate an ejection fraction on a dedicated work Conservation officer, nature. The patient received 17m GADAVIST GADOBUTROL 1 MMOL/ML IV SOLN. After 10 minutes inversion recovery sequences were used to assess for infiltration and scar tissue.   CONTRAST:  115mGADAVIST GADOBUTROL 1 MMOL/ML IV SOLN   FINDINGS: LEFT VENTRICLE:   Normal LV chamber size.   Wall thickness is increased in the basal septum as described below. Wall thinning in the anterior wall and inferolateral wall. The lateral wall demonstrates scar and is significantly thinned.   Akinesis of the inferolateral wall from base to mid ventricle, and hypokinesis of lateral apex.  Hypokinesis of the anterior wall from base to apex.   LVEF = 52%   Maximal wall thickness: 18 mm   Location: Basal anteroseptum   There is systolic anterior motion of the mitral valve. Flow dephasing suggests left ventricular outflow tract obstruction. Mitral regurgitation is seen, posteriorly directed, eccentric and qualitatively mild-moderate.   No  evidence of left ventricular apical aneurysm.   Findings are consistent with hypertrophic cardiomyopathy with obstruction. Morphologic subtype: Sigmoid.   There is late gadolinium enhancement in the left ventricular myocardium.   LGE noted subendocardium of the inferolateral and the inferior portion of the anterolateral wall from base to mid ventricle, including a mildly aneurysmal appearing inferolateral mid ventricular segment. Delayed myocardial enhancement is <50% of the thickness of the lateral wall, suggesting viability in this coronary distribution.   There is also LGE in the subendocardium of the anterior wall from base to mid ventricle. Delayed myocardial enhancement is <50% of the thickness of the anterior wall, suggesting viability in this coronary distribution.   Mild delayed myocardial enhancement noted at the RV insertion points. This represents <1% of the myocardial mass. Quantitation of LGE not performed in the setting of prominent delayed gadolinium enhancement from ischemic heart disease.   Normal T1 myocardial nulling kinetics suggests against a diagnosis of cardiac amyloidosis.   ECV = 22%, normal.   RIGHT VENTRICLE:   Normal right ventricular size, thickness and systolic function (RVEF =08%). There are no regional wall motion abnormalities.   ATRIA:   Mild left atrial enlargement.  Normal right atrial chamber size.   VALVES:   Mild-moderate mitral valve regurgitation. Due to systolic anterior motion by flow dephasing.   Trivial aortic valve regurgitation by flow dephasing.   PERICARDIUM:   Normal pericardium.  Trace circumferential pericardial effusion.   OTHER: No significant extracardiac findings noted.   MEASUREMENTS:   LVEDV: 174 mL   LVESV: 83 mL   SV: 91 mL   CO: 5.5 L/min   Myocardial mass: 160 g   RVEDV: 121 mL   RVEDS: 49 mL   RVSV: 72 mL   IMPRESSION: 1.  Normal left ventricular size, LVEF 52%.   2. Akinesis of  the basal-mid inferolateral wall with scar and wall thinning. Hypokinesis of the basal-apical anterior wall. Subendocardial delayed enhancement that is <50% thickness of the myocardium, suggestive of ischemic heart disease with viability in these regions.   3. Hypertrophic cardiomyopathy, sigmoid subtype. 18 mm basal anteroseptum.   4. Left ventricular outflow tract obstruction with systolic anterior motion of the mitral valve and qualitatively mild-moderate mitral valve regurgitation.   5. Mild myocardial enhancement in the RV insertion points at the base. No significant delayed myocardial enhancement associated with area of myocardial thickening.   6. No evidence of cardiac amyloidosis. Normal extracellular volume, 22%.   7.  Normal right ventricular size and function, RVEF 60%   8.  Trace pericardial effusion.     Electronically Signed   By: Cherlynn Kaiser   On: 02/29/2020 18:43   Echo 09/16/20: IMPRESSIONS     1. Left ventricular ejection fraction, by estimation, is 65 to 70%. The  left ventricle has normal function. The left ventricle has no regional  wall motion abnormalities. There is moderate concentric left ventricular  hypertrophy. Left ventricular  diastolic parameters are consistent with Grade I diastolic dysfunction  (impaired relaxation).   2. Right ventricular systolic function is normal. The right ventricular  size is normal.   3. Left atrial size was moderately  dilated.   4. The mitral valve is normal in structure. Trivial mitral valve  regurgitation. No evidence of mitral stenosis.   5. The aortic valve is normal in structure. There is mild calcification  of the aortic valve. There is mild thickening of the aortic valve. Aortic  valve regurgitation is mild. No aortic stenosis is present.   6. The inferior vena cava is normal in size with greater than 50%  respiratory variability, suggesting right atrial pressure of 3 mmHg.    Conclusion(s)/Recommendation(s): No intracardiac source of embolism  detected on this transthoracic study. A transesophageal echocardiogram is  recommended to exclude cardiac source of embolism if clinically indicated.   ASSESSMENT AND PLAN:    1.Hypertrophic CM:  euvolemic on exam.  Most recent Echo in Oct 2021 showed no significant LVOT gradient.  Moderate LVH and normal EF. Late gadolinium enhancement. He has NSVT noted on monitor. Now s/p ICD implant. Clinically doing well.    2.  CAD: s/p prior LAD, D1, and LCX PCI with STEMI in 2013. Admitted in June 2018 with in stent restenosis of the proximal LAD. Symptoms predominantly of DOE. Treated with cutting balloon PCI. Interestedly his Myoview study prior was normal so may not be a reliable study.  Currently without angina. Continue medical therapy   3.  HL:   intolerant to statins and zetia. On CLEAR trial now. LDL was 155. Now on Praluent. Excellent response with LDL 54. Follow up with Dr Debara Pickett. Repeat lab.    4.  Hypertension:  BP is well controlled.    5.  S/p CVA October 2021. Likely related to embolus nonobstructive but extensive carotid arterial disease.    .      Current medicines are reviewed at length with the patient today.  The patient does not have concerns regarding medicines.  The following changes have been made:  no change  Labs/ tests ordered today include:  No orders of the defined types were placed in this encounter.     Disposition:   FU with me in 6 months.  Signed, Octivia Canion Martinique, MD  05/10/2022 3:28 PM    [facility] [address] [place], [place], [address] Phone [phone], Fax [phone]

## 2022-05-10 ENCOUNTER — Ambulatory Visit: Payer: Medicare HMO | Admitting: Cardiology

## 2022-05-10 ENCOUNTER — Encounter: Payer: Self-pay | Admitting: Cardiology

## 2022-05-10 VITALS — BP 130/86 | HR 65 | Ht 68.5 in | Wt 178.0 lb

## 2022-05-10 DIAGNOSIS — Z95810 Presence of automatic (implantable) cardiac defibrillator: Secondary | ICD-10-CM

## 2022-05-10 DIAGNOSIS — E785 Hyperlipidemia, unspecified: Secondary | ICD-10-CM | POA: Diagnosis not present

## 2022-05-10 DIAGNOSIS — I2510 Atherosclerotic heart disease of native coronary artery without angina pectoris: Secondary | ICD-10-CM | POA: Diagnosis not present

## 2022-05-10 DIAGNOSIS — I4729 Other ventricular tachycardia: Secondary | ICD-10-CM

## 2022-05-10 DIAGNOSIS — Z85820 Personal history of malignant melanoma of skin: Secondary | ICD-10-CM | POA: Diagnosis not present

## 2022-05-10 DIAGNOSIS — Z9861 Coronary angioplasty status: Secondary | ICD-10-CM | POA: Diagnosis not present

## 2022-05-10 DIAGNOSIS — L820 Inflamed seborrheic keratosis: Secondary | ICD-10-CM | POA: Diagnosis not present

## 2022-05-10 DIAGNOSIS — D1801 Hemangioma of skin and subcutaneous tissue: Secondary | ICD-10-CM | POA: Diagnosis not present

## 2022-05-10 DIAGNOSIS — I422 Other hypertrophic cardiomyopathy: Secondary | ICD-10-CM

## 2022-05-10 DIAGNOSIS — L812 Freckles: Secondary | ICD-10-CM | POA: Diagnosis not present

## 2022-05-10 DIAGNOSIS — Z85828 Personal history of other malignant neoplasm of skin: Secondary | ICD-10-CM | POA: Diagnosis not present

## 2022-05-10 DIAGNOSIS — L570 Actinic keratosis: Secondary | ICD-10-CM | POA: Diagnosis not present

## 2022-05-10 DIAGNOSIS — L821 Other seborrheic keratosis: Secondary | ICD-10-CM | POA: Diagnosis not present

## 2022-05-10 NOTE — Patient Instructions (Signed)
Medication Instructions:  NO CHANGES  *If you need a refill on your cardiac medications before your next appointment, please call your pharmacy*   Lab Work: FASTING lipid panel in August -- about 1 week before visit with Dr. Debara Pickett   If you have labs (blood work) drawn today and your tests are completely normal, you will receive your results only by: [place] (if you have MyChart) OR A paper copy in the mail If you have any lab test that is abnormal or we need to change your treatment, we will call you to review the results.   Testing/Procedures: NONE   Follow-Up: At [facility], you and your health needs are our priority.  As part of our continuing mission to provide you with exceptional heart care, we have created designated Provider Care Teams.  These Care Teams include your primary Cardiologist (physician) and Advanced Practice Providers (APPs -  Physician Assistants and Nurse Practitioners) who all work together to provide you with the care you need, when you need it.  We recommend signing up for the patient portal called "MyChart".  Sign up information is provided on this After Visit Summary.  MyChart is used to connect with patients for Virtual Visits (Telemedicine).  Patients are able to view lab/test results, encounter notes, upcoming appointments, etc.  Non-urgent messages can be sent to your provider as well.   To learn more about what you can do with MyChart, go to [email or web address].    Your next appointment:   August 2023 with Dr. Debara Pickett   December 2023 with Dr. Martinique

## 2022-05-27 ENCOUNTER — Other Ambulatory Visit: Payer: Self-pay | Admitting: Internal Medicine

## 2022-06-09 ENCOUNTER — Ambulatory Visit (INDEPENDENT_AMBULATORY_CARE_PROVIDER_SITE_OTHER): Payer: Medicare HMO

## 2022-06-09 DIAGNOSIS — I422 Other hypertrophic cardiomyopathy: Secondary | ICD-10-CM | POA: Diagnosis not present

## 2022-06-10 LAB — CUP PACEART REMOTE DEVICE CHECK
Battery Remaining Longevity: 89 mo
Battery Remaining Percentage: 85 %
Battery Voltage: 3.01 V
Brady Statistic AP VP Percent: 1 %
Brady Statistic AP VS Percent: 48 %
Brady Statistic AS VP Percent: 1 %
Brady Statistic AS VS Percent: 51 %
Brady Statistic RA Percent Paced: 46 %
Brady Statistic RV Percent Paced: 1 %
Date Time Interrogation Session: 20230707020051
HighPow Impedance: 75 Ohm
Implantable Lead Implant Date: 20220407
Implantable Lead Implant Date: 20220407
Implantable Lead Location: 753859
Implantable Lead Location: 753860
Implantable Lead Model: 7122
Implantable Pulse Generator Implant Date: 20220407
Lead Channel Impedance Value: 350 Ohm
Lead Channel Impedance Value: 600 Ohm
Lead Channel Pacing Threshold Amplitude: 0.75 V
Lead Channel Pacing Threshold Amplitude: 0.75 V
Lead Channel Pacing Threshold Pulse Width: 0.5 ms
Lead Channel Pacing Threshold Pulse Width: 0.5 ms
Lead Channel Sensing Intrinsic Amplitude: 11.8 mV
Lead Channel Sensing Intrinsic Amplitude: 2.2 mV
Lead Channel Setting Pacing Amplitude: 2 V
Lead Channel Setting Pacing Amplitude: 2.5 V
Lead Channel Setting Pacing Pulse Width: 0.5 ms
Lead Channel Setting Sensing Sensitivity: 0.5 mV
Pulse Gen Serial Number: 810023633

## 2022-06-12 ENCOUNTER — Other Ambulatory Visit: Payer: Self-pay | Admitting: Cardiology

## 2022-06-22 ENCOUNTER — Other Ambulatory Visit: Payer: Self-pay | Admitting: Internal Medicine

## 2022-06-22 DIAGNOSIS — B001 Herpesviral vesicular dermatitis: Secondary | ICD-10-CM

## 2022-06-22 NOTE — Progress Notes (Signed)
Patient ID: [name], male    DOB: 04/19/1951  MRN: [id number]  CC: Herpes Labialis  Subjective: Brandon Gonzales is a 71 y.o. male who presents for herpes labialis.  His concerns today include:  06/22/2022 per triage RN note: Copied from [place] [id number]. Topic: General - Other >> Jun 22, 2022  8:43 AM Everette C wrote: Reason for CRM: Medication Refill - Medication: valACYclovir (VALTREX) 500 MG tablet [[id number]]    Has the patient contacted their pharmacy? No. The patient was uncertain of the medication's status  (Agent: If no, request that the patient contact the pharmacy for the refill. If patient does not wish to contact the pharmacy document the reason why and proceed with request.) (Agent: If yes, when and what did the pharmacy advise?)   Preferred Pharmacy (with phone number or street name): [facility] [id number], [place] [address] Phone: [phone] Fax: [phone] Hours: Not open 24 hours  Has the patient been seen for an appointment in the last year OR does the patient have an upcoming appointment? Yes.     Agent: Please be advised that RX refills may take up to 3 business days. We ask that you follow-up with your pharmacy.  06/23/2022  Needs refills on Valacyclovir for fever blisters. Has been without the same for 2 months due to needing refills. No further issues/concerns.   Patient Active Problem List   Diagnosis Date Noted   ICD (implantable cardioverter-defibrillator) in place 06/21/2021   Acute ischemic stroke ([place]) 09/15/2020   Near syncope 12/17/2019   Statin myopathy 11/13/2019   Herpes labialis 01/31/2018   Essential hypertension    Heart murmur    GERD (gastroesophageal reflux disease)    CAD S/P percutaneous coronary angioplasty    Cancer (HCC)    Angina at rest [facility]) 05/17/2017   Hypertrophic cardiomyopathy ([place])    S/P PTCA (percutaneous transluminal coronary angioplasty)     Unstable angina (HCC)    Positional vertigo 04/24/2016   NSVT (nonsustained ventricular tachycardia) ([place]) 09/25/2012   Hypertrophic obstructive cardiomyopathy(425.11) 09/25/2012   CKD (chronic kidney disease) stage 3, GFR 30-59 ml/min (HCC) 09/25/2012   ST elevation myocardial infarction (STEMI) of lateral wall ([place]) 09/22/2012   CAD (coronary artery disease) 01/11/2012   Dyslipidemia    H/O gastroesophageal reflux (GERD)    Arthritis      Current Outpatient Medications on File Prior to Visit  Medication Sig Dispense Refill   amLODipine (NORVASC) 2.5 MG tablet TAKE 1 TABLET BY MOUTH EVERY DAY 90 tablet 3   carvedilol (COREG) 12.5 MG tablet TAKE 0.5 TABLETS (6.25 MG TOTAL) BY MOUTH 2 (TWO) TIMES DAILY. 90 tablet 3   clopidogrel (PLAVIX) 75 MG tablet TAKE 1 TABLET BY MOUTH EVERY DAY 90 tablet 3   esomeprazole (NEXIUM) 20 MG capsule Take 20 mg by mouth every morning.     lisinopril (ZESTRIL) 40 MG tablet TAKE 1 TABLET BY MOUTH EVERY DAY 90 tablet 3   nitroGLYCERIN (NITROSTAT) 0.4 MG SL tablet Place 0.4 mg under the tongue every 5 (five) minutes as needed for chest pain.     oseltamivir (TAMIFLU) 30 MG capsule Take 1 capsule (30 mg total) by mouth 2 (two) times daily. 10 capsule 0   PRALUENT 150 MG/ML SOAJ INJECT 1 DOSE INTO THE SKIN EVERY 14 (FOURTEEN) DAYS. 6 mL 3   No current facility-administered medications on file prior to visit.    Allergies  Allergen Reactions   Antihistamines, Chlorpheniramine-Type Other (See Comments)    Altered mental status   Pheniramine Other (See Comments)    Altered mental status   Statins Other (See Comments)    Leg cramps     Zetia [Ezetimibe] Other (See Comments)    Leg cramps    Social History   Socioeconomic History   Marital status: Married    Spouse name: Shirlean Mylar   Number of children: Not on file   Years of education: high school    Highest education level: High school graduate  Occupational History   Occupation: retired Airline pilot    Tobacco Use   Smoking status: Never    Passive exposure: Never   Smokeless tobacco: Never  Vaping Use   Vaping Use: Never used  Substance and Sexual Activity   Alcohol use: No   Drug use: No   Sexual activity: Not Currently  Other Topics Concern   Not on file  Social History Narrative   Retired Airline pilot   Social Determinants of Radio broadcast assistant Strain: Not on file  Food Insecurity: Not on file  Transportation Needs: Not on file  Physical Activity: Unknown (02/26/2018)   Exercise Vital Sign    Days of Exercise per Week: 4 days    Minutes of Exercise per Session: Not on file  Stress: Not on file  Social Connections: Not on file  Intimate Partner Violence: Not on file    Family History  Problem Relation Age of Onset   Heart attack Mother 68   Heart disease Mother     Past Surgical History:  Procedure Laterality Date   CARDIAC CATHETERIZATION  2007   stent to LAD and PTCA of diagonal branch   CARDIAC CATHETERIZATION  2010   CORONARY ANGIOPLASTY WITH STENT PLACEMENT  2013   95-99% prox LAD, patent LAD stent distal to this, occluded mid-dist LCx, mild <10% RCA irreg; s/p DES-prox LAD & DES to mid-dist LCx   CORONARY BALLOON ANGIOPLASTY N/A 05/16/2017   Procedure: Coronary Balloon Angioplasty;  Surgeon: Leonie Man, MD;  Location: [facility];  Service: Cardiovascular;  Laterality: N/A;   [place]   right   ICD IMPLANT N/A 03/10/2021   Procedure: ICD IMPLANT;  Surgeon: Evans Lance, MD;  Location: [facility];  Service: Cardiovascular;  Laterality: N/A;   KNEE SURGERY  1993   left   LEFT HEART CATH Bilateral 09/22/2012   Procedure: LEFT HEART CATH;  Surgeon: Peter M Martinique, MD;  Location: [facility] CATH LAB;  Service: Cardiovascular;  Laterality: Bilateral;   LEFT HEART CATH AND CORONARY ANGIOGRAPHY N/A 05/16/2017   Procedure: Left Heart Cath and Coronary Angiography;  Surgeon: Leonie Man, MD;  Location: [facility] CV LAB;   Service: Cardiovascular;  Laterality: N/A;   PERCUTANEOUS CORONARY STENT INTERVENTION (PCI-S) Right 09/22/2012   Procedure: PERCUTANEOUS CORONARY STENT INTERVENTION (PCI-S);  Surgeon: Peter M Martinique, MD;  Location[facility] CATH LAB;  Service: Cardiovascular;  Laterality: Right;    ROS: Review of Systems Negative except as stated above  PHYSICAL EXAM: BP 113/73 (BP Location: Left Arm, Patient Position: Sitting, Cuff Size: Normal)   Pulse 74   Temp 98.3 F (36.8 C)   Resp 16   Ht 5' 8.5" (1.74 m)   Wt 168 lb (76.2 kg)   SpO2 95%   BMI 25.17 kg/m   Physical Exam HENT:     Head: Normocephalic and atraumatic.     Mouth/Throat:  Comments: Fever blister of lip.  Eyes:     Extraocular Movements: Extraocular movements intact.     Conjunctiva/sclera: Conjunctivae normal.     Pupils: Pupils are equal, round, and reactive to light.  Cardiovascular:     Rate and Rhythm: Normal rate and regular rhythm.     Pulses: Normal pulses.     Heart sounds: Normal heart sounds.  Musculoskeletal:     Cervical back: Normal range of motion and neck supple.  Neurological:     General: No focal deficit present.     Mental Status: He is alert and oriented to person, place, and time.  Psychiatric:        Mood and Affect: Mood normal.        Behavior: Behavior normal.     ASSESSMENT AND PLAN: 1. Herpes labialis - Valacyclovir as prescribed. Counseled on medication adherence and adverse effects.  - Follow-up with primary provider as scheduled.  - valACYclovir (VALTREX) 500 MG tablet; Take 1 tablet (500 mg total) by mouth daily as needed (fever blisters).  Dispense: 90 tablet; Refill: 0   Patient was given the opportunity to ask questions.  Patient verbalized understanding of the plan and was able to repeat key elements of the plan. Patient was given clear instructions to go to Emergency Department or return to medical center if symptoms don't improve, worsen, or new problems develop.The patient  verbalized understanding.    Requested Prescriptions   Signed Prescriptions Disp Refills   valACYclovir (VALTREX) 500 MG tablet 90 tablet 0    Sig: Take 1 tablet (500 mg total) by mouth daily as needed (fever blisters).    Return for Follow-Up or next available Dorna Mai, MD.  Camillia Herter, NP

## 2022-06-22 NOTE — Telephone Encounter (Signed)
Copied from [place] #[id number]. Topic: General - Other >> Jun 22, 2022  8:43 AM Everette C wrote: Reason for CRM: Medication Refill - Medication: valACYclovir (VALTREX) 500 MG tablet [[id number]]   Has the patient contacted their pharmacy? No. The patient was uncertain of the medication's status  (Agent: If no, request that the patient contact the pharmacy for the refill. If patient does not wish to contact the pharmacy document the reason why and proceed with request.) (Agent: If yes, when and what did the pharmacy advise?)  Preferred Pharmacy (with phone number or street name): [facility] [id number], [place] [address] Phone: [phone] Fax: [phone] Hours: Not open 24 hours   Has the patient been seen for an appointment in the last year OR does the patient have an upcoming appointment? Yes.    Agent: Please be advised that RX refills may take up to 3 business days. We ask that you follow-up with your pharmacy.

## 2022-06-23 ENCOUNTER — Ambulatory Visit (INDEPENDENT_AMBULATORY_CARE_PROVIDER_SITE_OTHER): Payer: Medicare HMO | Admitting: Family

## 2022-06-23 ENCOUNTER — Encounter: Payer: Self-pay | Admitting: Family

## 2022-06-23 VITALS — BP 113/73 | HR 74 | Temp 98.3°F | Resp 16 | Ht 68.5 in | Wt 168.0 lb

## 2022-06-23 DIAGNOSIS — B001 Herpesviral vesicular dermatitis: Secondary | ICD-10-CM | POA: Diagnosis not present

## 2022-06-23 MED ORDER — VALACYCLOVIR HCL 500 MG PO TABS: 500.0000 mg | ORAL_TABLET | Freq: Every day | ORAL | 0 refills | Status: DC | PRN

## 2022-06-23 NOTE — Progress Notes (Signed)
Pt presents for fever blister flare on lip needs refill on Valtrex had not had to use in about year

## 2022-06-23 NOTE — Telephone Encounter (Signed)
Requested medications are due for refill today.  yes  Requested medications are on the active medications list.  yes  Last refill. 04/07/2021  Future visit scheduled.   yes  Notes to clinic.  Phill Myron listed as PCP.    Requested Prescriptions  Pending Prescriptions Disp Refills   valACYclovir (VALTREX) 500 MG tablet 90 tablet 3    Sig: Take 1 tablet (500 mg total) by mouth daily as needed (fever blisters).     Antimicrobials:  Antiviral Agents - Anti-Herpetic Passed - 06/22/2022 11:24 AM      Passed - Valid encounter within last 12 months    Recent Outpatient Visits           10 months ago Chest pain, unspecified type   Primary Care at [facility], Kriste Basque, NP   1 year ago Herpes labialis   Primary Care at [facility], Bayard Beaver, MD   1 year ago Medicare annual wellness visit, subsequent   Primary Care at [facility], Bayard Beaver, MD   1 year ago Encounter to establish care   Primary Care at [facility], Bayard Beaver, MD   8 years ago Herpes labialis   [facility] at [facility], Ranell Patrick, MD       Future Appointments             Today Camillia Herter, NP Primary Care at [facility]   In 1 month Hilty, Nadean Corwin, MD [facility] [place] [facility] [place]   In 4 months Martinique, Ander Slade, MD [facility] [place]

## 2022-06-23 NOTE — Patient Instructions (Addendum)
Cold Sore  A cold sore, also called a fever blister, is a small, fluid-filled sore that forms inside of the mouth or on the lips, gums, nose, chin, or cheeks. Cold sores can spread to other parts of the body, such as the eyes, fingers, or genitals. Cold sores can spread from person to person (are contagious) until the sores crust over completely. Most cold sores go away within 2 weeks. What are the causes? Cold sores are caused by a virus (herpes simplex virus type 1, HSV-1). The virus can spread from person to person through close contact, such as through: Kissing. Touching the affected area. Sharing personal items such as lip balm, razors, a drinking glass, or eating utensils. What increases the risk? Being tired, stressed, or sick. Having your period (menstruating). Being pregnant. Taking certain medicines. Being out in cold weather or getting too much sun. What are the signs or symptoms? Symptoms of a cold sore go through different stages: Tingling, itching, or burning is felt 1-2 days before the cold sore appears. Fluid-filled blisters appear on the lips, inside the mouth, on the nose, or on the cheeks. The blisters start to ooze clear fluid. The blisters dry up, and a yellow crust appears in their place. The crust falls off. In some cases, other symptoms can develop along with cold sores. These can include: Fever. Sore throat. Headache. Muscle aches. Swollen neck glands. How is this treated? There is no cure for cold sores or the virus that causes them. There is also no vaccine to prevent the virus. Most cold sores go away on their own without treatment within 2 weeks. Your doctor may prescribe medicines to: Help with pain. Keep the virus from growing. Help you heal faster. Medicines may be in the form of creams, gels, pills, or a shot. Follow these instructions at home: Medicines Take or apply over-the-counter and prescription medicines only as told by your doctor. Use a  cotton-tip swab to apply creams or gels to your sores. Ask your doctor if you can take lysine supplements. These may help with healing. Sore care  Do not touch the sores or pick the scabs. Wash your hands often with soap and water for at least 20 seconds. Do not touch your eyes without washing your hands first. Keep the sores clean and dry. If told, put ice on the sores. To do this: Put ice in a plastic bag. Place a towel between your skin and the bag. Leave the ice on for 20 minutes, 2-3 times a day. Take off the ice if your skin turns bright red. This is very important. If you cannot feel pain, heat, or cold, you have a greater risk of damage to the area. Eating and drinking Eat a soft, bland diet. Avoid eating hot, cold, or salty foods. These can hurt your mouth. Use a straw if it hurts to drink out of a glass. Eat foods that have a lot of lysine in them. These include meat, fish, and dairy products. Avoid sugary foods, chocolates, nuts, and grains. These foods have a high amount of a substance (arginine) that can cause the virus to grow. Lifestyle Do not kiss, have oral sex, or share personal items until your sores heal. Stress, poor sleep, and being out in the sun can trigger a cold sore. Make sure you: Do activities that help you relax, such as deep breathing exercises or meditation. Get enough sleep. Put sunscreen on your lips before you go out in the sun. Contact a   doctor if: You have symptoms for more than 2 weeks. You have pus coming from the sores. You have redness that is spreading. You have pain or irritation in your eye. You get sores on your genitals. Your sores do not heal within 2 weeks. You get cold sores often. Get help right away if: You have a fever and your symptoms suddenly get worse. You have a headache and confusion. You have tiredness (fatigue). You do not want to eat as much as normal (loss of appetite). You have a stiff neck or are sensitive to  light. Summary A cold sore is a small, fluid-filled sore that forms inside of the mouth or on the lips, gums, nose, chin, or cheeks. Cold sores can spread from person to person (are contagious) until the sores crust over completely. Most cold sores go away within 2 weeks. Wash your hands often. Do not touch your eyes without washing your hands first. Do not kiss, have oral sex, or share personal items until your sores heal. Contact a doctor if your sores do not heal within 2 weeks. This information is not intended to replace advice given to you by your health care provider. Make sure you discuss any questions you have with your health care provider. Document Revised: 08/31/2021 Document Reviewed: 08/31/2021 Elsevier Patient Education  2023 Elsevier Inc.  

## 2022-06-26 NOTE — Progress Notes (Signed)
Remote ICD transmission.   

## 2022-07-20 DIAGNOSIS — E785 Hyperlipidemia, unspecified: Secondary | ICD-10-CM | POA: Diagnosis not present

## 2022-07-20 LAB — LIPID PANEL
Chol/HDL Ratio: 2.6 ratio (ref 0.0–5.0)
Cholesterol, Total: 90 mg/dL — ABNORMAL LOW (ref 100–199)
HDL: 35 mg/dL — ABNORMAL LOW (ref >39)
LDL Chol Calc (NIH): 36 mg/dL (ref 0–99)
Triglycerides: 98 mg/dL (ref 0–149)
VLDL Cholesterol Cal: 19 mg/dL (ref 5–40)

## 2022-07-31 ENCOUNTER — Ambulatory Visit: Payer: Medicare HMO | Attending: Internal Medicine | Admitting: Internal Medicine

## 2022-07-31 ENCOUNTER — Encounter: Payer: Self-pay | Admitting: Internal Medicine

## 2022-07-31 ENCOUNTER — Telehealth: Payer: Self-pay

## 2022-07-31 VITALS — BP 142/88 | HR 67 | Ht 68.5 in | Wt 168.0 lb

## 2022-07-31 DIAGNOSIS — I2510 Atherosclerotic heart disease of native coronary artery without angina pectoris: Secondary | ICD-10-CM

## 2022-07-31 DIAGNOSIS — T466X5A Adverse effect of antihyperlipidemic and antiarteriosclerotic drugs, initial encounter: Secondary | ICD-10-CM

## 2022-07-31 DIAGNOSIS — Z9861 Coronary angioplasty status: Secondary | ICD-10-CM | POA: Diagnosis not present

## 2022-07-31 DIAGNOSIS — M791 Myalgia, unspecified site: Secondary | ICD-10-CM

## 2022-07-31 DIAGNOSIS — E782 Mixed hyperlipidemia: Secondary | ICD-10-CM

## 2022-07-31 DIAGNOSIS — I10 Essential (primary) hypertension: Secondary | ICD-10-CM | POA: Diagnosis not present

## 2022-07-31 DIAGNOSIS — Z8673 Personal history of transient ischemic attack (TIA), and cerebral infarction without residual deficits: Secondary | ICD-10-CM | POA: Diagnosis not present

## 2022-07-31 DIAGNOSIS — E785 Hyperlipidemia, unspecified: Secondary | ICD-10-CM

## 2022-07-31 MED ORDER — NITROGLYCERIN 0.4 MG SL SUBL: 0.4000 mg | SUBLINGUAL_TABLET | SUBLINGUAL | 2 refills | Status: DC | PRN

## 2022-07-31 NOTE — Addendum Note (Signed)
Addended by: Dionicio Stall E on: 07/31/2022 10:03 AM   Modules accepted: Orders

## 2022-07-31 NOTE — Patient Instructions (Signed)
Medication Instructions:  Your physician recommends that you continue on your current medications as directed. Please refer to the Current Medication list given to you today.  *If you need a refill on your cardiac medications before your next appointment, please call your pharmacy*   Lab Work: Please return for FASTING Blood Work before Lipid Panel the next appointment in 12 Months. No appointment needed, lab here at the office is open Monday-Friday from 8AM to 4PM and closed daily for lunch from 12:45-1:45.    Follow-Up: At [facility], you and your health needs are our priority.  As part of our continuing mission to provide you with exceptional heart care, we have created designated Provider Care Teams.  These Care Teams include your primary Cardiologist (physician) and Advanced Practice Providers (APPs -  Physician Assistants and Nurse Practitioners) who all work together to provide you with the care you need, when you need it.  We recommend signing up for the patient portal called "MyChart".  Sign up information is provided on this After Visit Summary.  MyChart is used to connect with patients for Virtual Visits (Telemedicine).  Patients are able to view lab/test results, encounter notes, upcoming appointments, etc.  Non-urgent messages can be sent to your provider as well.   To learn more about what you can do with MyChart, go to [email or web address].    Your next appointment:   12 year(s)  The format for your next appointment:   In Person  Provider:   Dr. Lyman Bishop

## 2022-07-31 NOTE — Telephone Encounter (Signed)
I connected with  Brandon Gonzales on 07/31/22 by a video enabled telemedicine application and verified that I am speaking with the correct person using two identifiers.   I discussed the limitations of evaluation and management by telemedicine. The patient expressed understanding and agreed to proceed.

## 2022-07-31 NOTE — Addendum Note (Signed)
Addended by: Dionicio Stall E on: 07/31/2022 09:53 AM   Modules accepted: Orders

## 2022-07-31 NOTE — Progress Notes (Signed)
Virtual Visit via Video Note   This visit type was conducted due to national recommendations for restrictions regarding the COVID-19 Pandemic (e.g. social distancing) in an effort to limit this patient's exposure and mitigate transmission in our community.  Due to his co-morbid illnesses, this patient is at least at moderate risk for complications without adequate follow up.  This format is felt to be most appropriate for this patient at this time.  All issues noted in this document were discussed and addressed.  A limited physical exam was performed with this format.  Please refer to the patient's chart for his consent to telehealth for [facility].      Date:  07/31/2022   ID:  Brandon Gonzales, DOB October 31, 1951, MRN [id number] The patient was identified using 2 identifiers.  Evaluation Performed:  Follow-Up Visit  Patient Location:  [address] [place] [address]  Provider location:   [address] [place], [place] [address]  PCP:  Dorna Mai, MD  Cardiologist:  Peter Martinique, MD Electrophysiologist:  Cristopher Peru, MD   Chief Complaint:  Manage dyslipidemia  History of Present Illness:    Brandon Gonzales is a 72 y.o. male who presents via audio/video conferencing for a telehealth visit today.  This is a pleasant 70 year old male with a complex cardiac history including coronary artery disease with stents to the LAD and circumflex with prior STEMI, hypertrophic cardiomyopathy who recently had an ICD placed, prior myocardial infarction, dyslipidemia, hypertension and stroke.  Unfortunately, he has been intolerant to numerous statins including rosuvastatin, atorvastatin and ezetimibe, all causing severe leg cramps.  He was referred to the research foundation and was enrolled in the CLEAR-outcomes trial, however he was randomized to placebo and therefore had no benefit from bempedoic acid.  He just completed the trial and was referred by his primary care provider  for options for management of his dyslipidemia.  His most recent lipid profile was in October however appears to be fairly stable with his other previous studies.  This demonstrated total cholesterol 208, triglycerides 238, HDL 27 and LDL of 133.  His target LDL is less than 70.  He says despite an excellent diet and work at trying to lower his cholesterol, he has not been able to get his LDL to target.  01/26/2022  Brandon Gonzales is seen today for follow-up.  He is doing well on Praluent.  He had a marked reduction in his lipids.  Total cholesterol now 111 (down from 210), triglycerides 87, HDL 40 and LDL 54, down from 155.  He is also tolerating the medicine very well.  Unfortunately he developed influenza.  He is now going to start on some Tamiflu and this is why we did a virtual visit today.  His only other concern was the cost of the medication.  He says he does not think he will qualify for need based grant.  Cost is gone up significantly though.  I believe he said $900 for 13-monthsupply  07/31/2022  Brandon Gonzales continues to do well.  He is tolerating Praluent without any issues.  He is on the 150 mg every 2 week dose.  Lipids have further improved with total cholesterol 90, triglycerides 98, HDL 35 and LDL 36, down from 54 about 8 months ago.  Prior CV studies:   The following studies were reviewed today:  Chart reviewed, lab work  PMHx:  Past Medical History:  Diagnosis Date   Arthritis    Cancer ([place]  SKIN CA  SQUAMOUS CELL   Coronary artery disease    a. 2007 Stent to LAD, PTCA D1;  b. DES to LAD & LCx 10/13 in the setting of STEMI.   Dyslipidemia    a. Statin and zetia-intolerant. On niacin.   GERD (gastroesophageal reflux disease)    Heart murmur    Hyperlipidemia    Phreesia 11/22/2020   Hypertension    Hypertrophic cardiomyopathy ([place])    a. 09/2012 Echo: EF 55%, mid to apical anterolateral, posterior, apical anterior, apical HK, Gr1 DD, turbulence across LVOT suggesting  degree of obstruction, mild MR, mildly dil LA.   Myocardial infarction ([place])    Phreesia 11/22/2020   Stroke [facility])    Phreesia 11/22/2020    Past Surgical History:  Procedure Laterality Date   CARDIAC CATHETERIZATION  2007   stent to LAD and PTCA of diagonal branch   CARDIAC CATHETERIZATION  2010   CORONARY ANGIOPLASTY WITH STENT PLACEMENT  2013   95-99% prox LAD, patent LAD stent distal to this, occluded mid-dist LCx, mild <10% RCA irreg; s/p DES-prox LAD & DES to mid-dist LCx   CORONARY BALLOON ANGIOPLASTY N/A 05/16/2017   Procedure: Coronary Balloon Angioplasty;  Surgeon: Leonie Man, MD;  Location: [place] CV LAB;  Service: Cardiovascular;  Laterality: N/A;   [place]   right   ICD IMPLANT N/A 03/10/2021   Procedure: ICD IMPLANT;  Surgeon: Evans Lance, MD;  Location: [place] CV LAB;  Service: Cardiovascular;  Laterality: N/A;   KNEE SURGERY  1993   left   LEFT HEART CATH Bilateral 09/22/2012   Procedure: LEFT HEART CATH;  Surgeon: Peter M Martinique, MD;  Location: [facility] CATH LAB;  Service: Cardiovascular;  Laterality: Bilateral;   LEFT HEART CATH AND CORONARY ANGIOGRAPHY N/A 05/16/2017   Procedure: Left Heart Cath and Coronary Angiography;  Surgeon: Leonie Man, MD;  Location: [place] CV LAB;  Service: Cardiovascular;  Laterality: N/A;   PERCUTANEOUS CORONARY STENT INTERVENTION (PCI-S) Right 09/22/2012   Procedure: PERCUTANEOUS CORONARY STENT INTERVENTION (PCI-S);  Surgeon: Peter M Martinique, MD;  Location: [facility] CATH LAB;  Service: Cardiovascular;  Laterality: Right;    FAMHx:  Family History  Problem Relation Age of Onset   Heart attack Mother 87   Heart disease Mother     SOCHx:   reports that he has never smoked. He has never been exposed to tobacco smoke. He has never used smokeless tobacco. He reports that he does not drink alcohol and does not use drugs.  ALLERGIES:  Allergies  Allergen Reactions   Antihistamines, Chlorpheniramine-Type Other  (See Comments)    Altered mental status   Pheniramine Other (See Comments)    Altered mental status   Statins Other (See Comments)    Leg cramps     Zetia [Ezetimibe] Other (See Comments)    Leg cramps    MEDS:  Current Meds  Medication Sig   amLODipine (NORVASC) 2.5 MG tablet TAKE 1 TABLET BY MOUTH EVERY DAY   carvedilol (COREG) 12.5 MG tablet TAKE 0.5 TABLETS (6.25 MG TOTAL) BY MOUTH 2 (TWO) TIMES DAILY.   clopidogrel (PLAVIX) 75 MG tablet TAKE 1 TABLET BY MOUTH EVERY DAY   esomeprazole (NEXIUM) 20 MG capsule Take 20 mg by mouth every morning.   lisinopril (ZESTRIL) 40 MG tablet TAKE 1 TABLET BY MOUTH EVERY DAY   nitroGLYCERIN (NITROSTAT) 0.4 MG SL tablet Place 0.4 mg under the tongue every 5 (five) minutes as needed for chest  pain.   oseltamivir (TAMIFLU) 30 MG capsule Take 1 capsule (30 mg total) by mouth 2 (two) times daily.   PRALUENT 150 MG/ML SOAJ INJECT 1 DOSE INTO THE SKIN EVERY 14 (FOURTEEN) DAYS.   valACYclovir (VALTREX) 500 MG tablet Take 1 tablet (500 mg total) by mouth daily as needed (fever blisters).     ROS: Pertinent items noted in HPI and remainder of comprehensive ROS otherwise negative.  Labs/Other Tests and Data Reviewed:    Recent Labs: 08/19/2021: BUN 23; Creatinine, Ser 1.48; Hemoglobin 15.5; Platelets 155; Potassium 5.0; Sodium 143   Recent Lipid Panel Lab Results  Component Value Date/Time   CHOL 90 (L) 07/20/2022 09:56 AM   TRIG 98 07/20/2022 09:56 AM   HDL 35 (L) 07/20/2022 09:56 AM   CHOLHDL 2.6 07/20/2022 09:56 AM   CHOLHDL 7.7 09/16/2020 04:28 AM   LDLCALC 36 07/20/2022 09:56 AM   LDLDIRECT 159.5 10/01/2012 08:47 AM    Wt Readings from Last 3 Encounters:  07/31/22 168 lb (76.2 kg)  06/23/22 168 lb (76.2 kg)  05/10/22 178 lb (80.7 kg)     Exam:    Vital Signs:  BP (!) 142/88   Pulse 67   Ht 5' 8.5" (1.74 m)   Wt 168 lb (76.2 kg)   SpO2 97%   BMI 25.17 kg/m    General appearance: alert, appears stated age, and no  distress Lungs: No visual respiratory difficulty Abdomen: Normal weight Extremities: extremities normal, atraumatic, no cyanosis or edema Skin: Skin color, texture, turgor normal. No rashes or lesions Neurologic: Grossly normal : Pleasant  ASSESSMENT & PLAN:    Mixed dyslipidemia, goal LDL less than 55 Coronary artery disease with prior STEMI and multiple PCI's Hypertrophic cardiomyopathy status post AICD Hypertension History of stroke Statin and ezetimibe intolerance-myalgias  Brandon Gonzales is at target LDL less than 55 in fact has gone even lower with more physical activity and diet.  He is in the process of building a house on his property.  Overall he is doing very well with the medication.  I will continue with this current dose.  Plan repeat lipid in 1 year and follow-up with me at that time.  COVID-19 Education: The signs and symptoms of COVID-19 were discussed with the patient and how to seek care for testing (follow up with PCP or arrange E-visit).  The importance of social distancing was discussed today.  Patient Risk:   After full review of this patients clinical status, I feel that they are at least moderate risk at this time.  Time:   Today, I have spent 15 minutes with the patient with telehealth technology discussing dyslipidemia, statin intolerance, review of medical history.     Medication Adjustments/Labs and Tests Ordered: Current medicines are reviewed at length with the patient today.  Concerns regarding medicines are outlined above.   Tests Ordered: No orders of the defined types were placed in this encounter.    Medication Changes: No orders of the defined types were placed in this encounter.    Disposition:  in 1 year(s)  Pixie Casino, MD, [facility], Media Director of the [facility]  [facility] Diplomate of the American Board of Clinical Lipidology Attending  Cardiologist  Direct Dial: [phone]  Fax: [phone]  Website:  [email or web address]  Pixie Casino, MD  07/31/2022 9:35 AM

## 2022-09-04 ENCOUNTER — Other Ambulatory Visit: Payer: Medicare HMO

## 2022-09-08 ENCOUNTER — Ambulatory Visit (INDEPENDENT_AMBULATORY_CARE_PROVIDER_SITE_OTHER): Payer: Medicare HMO

## 2022-09-08 DIAGNOSIS — I422 Other hypertrophic cardiomyopathy: Secondary | ICD-10-CM | POA: Diagnosis not present

## 2022-09-08 LAB — CUP PACEART REMOTE DEVICE CHECK
Battery Remaining Longevity: 85 mo
Battery Remaining Percentage: 83 %
Battery Voltage: 3.01 V
Brady Statistic AP VP Percent: 1 %
Brady Statistic AP VS Percent: 47 %
Brady Statistic AS VP Percent: 1 %
Brady Statistic AS VS Percent: 53 %
Brady Statistic RA Percent Paced: 45 %
Brady Statistic RV Percent Paced: 1 %
Date Time Interrogation Session: 20231006020043
HighPow Impedance: 72 Ohm
Implantable Lead Implant Date: 20220407
Implantable Lead Implant Date: 20220407
Implantable Lead Location: 753859
Implantable Lead Location: 753860
Implantable Lead Model: 7122
Implantable Pulse Generator Implant Date: 20220407
Lead Channel Impedance Value: 380 Ohm
Lead Channel Impedance Value: 480 Ohm
Lead Channel Pacing Threshold Amplitude: 0.75 V
Lead Channel Pacing Threshold Amplitude: 0.75 V
Lead Channel Pacing Threshold Pulse Width: 0.5 ms
Lead Channel Pacing Threshold Pulse Width: 0.5 ms
Lead Channel Sensing Intrinsic Amplitude: 1.6 mV
Lead Channel Sensing Intrinsic Amplitude: 11.8 mV
Lead Channel Setting Pacing Amplitude: 2 V
Lead Channel Setting Pacing Amplitude: 2.5 V
Lead Channel Setting Pacing Pulse Width: 0.5 ms
Lead Channel Setting Sensing Sensitivity: 0.5 mV
Pulse Gen Serial Number: 810023633

## 2022-09-13 DIAGNOSIS — H25043 Posterior subcapsular polar age-related cataract, bilateral: Secondary | ICD-10-CM | POA: Diagnosis not present

## 2022-09-13 DIAGNOSIS — Z0100 Encounter for examination of eyes and vision without abnormal findings: Secondary | ICD-10-CM | POA: Diagnosis not present

## 2022-09-14 NOTE — Progress Notes (Signed)
Remote ICD transmission.   

## 2022-09-17 ENCOUNTER — Other Ambulatory Visit: Payer: Self-pay | Admitting: Family

## 2022-09-17 DIAGNOSIS — B001 Herpesviral vesicular dermatitis: Secondary | ICD-10-CM

## 2022-10-12 ENCOUNTER — Other Ambulatory Visit: Payer: Self-pay | Admitting: Cardiology

## 2022-10-24 ENCOUNTER — Encounter: Payer: Self-pay | Admitting: Nurse Practitioner

## 2022-10-24 ENCOUNTER — Ambulatory Visit (INDEPENDENT_AMBULATORY_CARE_PROVIDER_SITE_OTHER): Payer: Medicare HMO | Admitting: Nurse Practitioner

## 2022-10-24 VITALS — BP 145/83 | HR 60 | Resp 18 | Ht 68.5 in | Wt 166.0 lb

## 2022-10-24 DIAGNOSIS — E782 Mixed hyperlipidemia: Secondary | ICD-10-CM | POA: Diagnosis not present

## 2022-10-24 DIAGNOSIS — Z7689 Persons encountering health services in other specified circumstances: Secondary | ICD-10-CM | POA: Diagnosis not present

## 2022-10-24 DIAGNOSIS — Z0000 Encounter for general adult medical examination without abnormal findings: Secondary | ICD-10-CM

## 2022-10-24 DIAGNOSIS — Z125 Encounter for screening for malignant neoplasm of prostate: Secondary | ICD-10-CM | POA: Diagnosis not present

## 2022-10-24 DIAGNOSIS — R5383 Other fatigue: Secondary | ICD-10-CM | POA: Diagnosis not present

## 2022-10-24 DIAGNOSIS — M79671 Pain in right foot: Secondary | ICD-10-CM | POA: Diagnosis not present

## 2022-10-24 DIAGNOSIS — I25118 Atherosclerotic heart disease of native coronary artery with other forms of angina pectoris: Secondary | ICD-10-CM | POA: Diagnosis not present

## 2022-10-24 NOTE — Progress Notes (Signed)
New Patient Office Visit  Subjective    Patient ID: DEMICHAEL TRAUM, male    DOB: 1951-10-01  Age: 71 y.o. MRN: [id number]  CC:  Chief Complaint  Patient presents with   Establish Care    Concerned about DM    HPI Brandon Gonzales presents to establish care -relocating from another practice so he can go closer to home. -has pain in the bottom of his right foot.  -has been present for 2 to 3 months.  -hurts more at night when he has been on his feet for a long time.  -did fall off of a ladder in 09/2021. Was seen by sports medicine. Did some physical therapy which did help.  -he is able to move his toes and ankles without difficulty   Outpatient Encounter Medications as of 10/24/2022  Medication Sig   amLODipine (NORVASC) 2.5 MG tablet TAKE 1 TABLET BY MOUTH EVERY DAY   carvedilol (COREG) 12.5 MG tablet TAKE 0.5 TABLETS (6.25 MG TOTAL) BY MOUTH 2 (TWO) TIMES DAILY.   esomeprazole (NEXIUM) 20 MG capsule Take 20 mg by mouth every morning.   lisinopril (ZESTRIL) 40 MG tablet TAKE 1 TABLET BY MOUTH EVERY DAY   nitroGLYCERIN (NITROSTAT) 0.4 MG SL tablet Place 1 tablet (0.4 mg total) under the tongue every 5 (five) minutes as needed for chest pain.   oseltamivir (TAMIFLU) 30 MG capsule Take 1 capsule (30 mg total) by mouth 2 (two) times daily.   PRALUENT 150 MG/ML SOAJ INJECT 1 DOSE INTO THE SKIN EVERY 14 (FOURTEEN) DAYS.   valACYclovir (VALTREX) 500 MG tablet Take 1 tablet (500 mg total) by mouth daily as needed (fever blisters).   [DISCONTINUED] clopidogrel (PLAVIX) 75 MG tablet TAKE 1 TABLET BY MOUTH EVERY DAY   No facility-administered encounter medications on file as of 10/24/2022.    Past Medical History:  Diagnosis Date   Arthritis    Cancer ([place])    SKIN CA  SQUAMOUS CELL   Coronary artery disease    a. 2007 Stent to LAD, PTCA D1;  b. DES to LAD & LCx 10/13 in the setting of STEMI.   Dyslipidemia    a. Statin and zetia-intolerant. On niacin.   GERD (gastroesophageal  reflux disease)    Heart murmur    Hyperlipidemia    Phreesia 11/22/2020   Hypertension    Hypertrophic cardiomyopathy ([place])    a. 09/2012 Echo: EF 55%, mid to apical anterolateral, posterior, apical anterior, apical HK, Gr1 DD, turbulence across LVOT suggesting degree of obstruction, mild MR, mildly dil LA.   Myocardial infarction ([place])    Phreesia 11/22/2020   Stroke [facility])    Phreesia 11/22/2020    Past Surgical History:  Procedure Laterality Date   CARDIAC CATHETERIZATION  2007   stent to LAD and PTCA of diagonal branch   CARDIAC CATHETERIZATION  2010   CORONARY ANGIOPLASTY WITH STENT PLACEMENT  2013   95-99% prox LAD, patent LAD stent distal to this, occluded mid-dist LCx, mild <10% RCA irreg; s/p DES-prox LAD & DES to mid-dist LCx   CORONARY BALLOON ANGIOPLASTY N/A 05/16/2017   Procedure: Coronary Balloon Angioplasty;  Surgeon: Leonie Man, MD;  Location: [place] CV LAB;  Service: Cardiovascular;  Laterality: N/A;   [place]   right   ICD IMPLANT N/A 03/10/2021   Procedure: ICD IMPLANT;  Surgeon: Evans Lance, MD;  Location: [place] CV LAB;  Service: Cardiovascular;  Laterality: N/A;   KNEE SURGERY  1993   left   LEFT HEART CATH Bilateral 09/22/2012   Procedure: LEFT HEART CATH;  Surgeon: Peter M Martinique, MD;  Location: [facility] CATH LAB;  Service: Cardiovascular;  Laterality: Bilateral;   LEFT HEART CATH AND CORONARY ANGIOGRAPHY N/A 05/16/2017   Procedure: Left Heart Cath and Coronary Angiography;  Surgeon: Leonie Man, MD;  Location: [place] CV LAB;  Service: Cardiovascular;  Laterality: N/A;   PERCUTANEOUS CORONARY STENT INTERVENTION (PCI-S) Right 09/22/2012   Procedure: PERCUTANEOUS CORONARY STENT INTERVENTION (PCI-S);  Surgeon: Peter M Martinique, MD;  Location: [facility] CATH LAB;  Service: Cardiovascular;  Laterality: Right;    Family History  Problem Relation Age of Onset   Heart attack Mother 32   Heart disease Mother     Social History    Socioeconomic History   Marital status: Married    Spouse name: Shirlean Mylar   Number of children: Not on file   Years of education: high school    Highest education level: High school graduate  Occupational History   Occupation: retired Airline pilot   Tobacco Use   Smoking status: Never    Passive exposure: Never   Smokeless tobacco: Never  Vaping Use   Vaping Use: Never used  Substance and Sexual Activity   Alcohol use: No   Drug use: No   Sexual activity: Not Currently  Other Topics Concern   Not on file  Social History Narrative   Retired Airline pilot   Social Determinants of Radio broadcast assistant Strain: Not on file  Food Insecurity: Not on file  Transportation Needs: Not on file  Physical Activity: Unknown (02/26/2018)   Exercise Vital Sign    Days of Exercise per Week: 4 days    Minutes of Exercise per Session: Not on file  Stress: Not on file  Social Connections: Not on file  Intimate Partner Violence: Not on file    Review of Systems  Constitutional:  Negative for chills, fever and malaise/fatigue.  HENT:  Negative for congestion, sinus pain and sore throat.   Eyes: Negative.   Respiratory:  Negative for cough, shortness of breath and wheezing.   Cardiovascular:  Negative for chest pain, palpitations and leg swelling.       Elevated blood pressure   Gastrointestinal:  Negative for constipation, diarrhea, nausea and vomiting.  Genitourinary: Negative.   Musculoskeletal:  Positive for myalgias.       Right foot pain   Skin: Negative.   Neurological:  Negative for dizziness and headaches.  Endo/Heme/Allergies:  Does not bruise/bleed easily.  Psychiatric/Behavioral:  Negative for depression. The patient is not nervous/anxious.         Objective    Today's Vitals   10/24/22 0932 10/24/22 0933  BP: (Abnormal) 156/93 (Abnormal) 145/83  Pulse: (Abnormal) 59 60  Resp: 18   SpO2: 100%   Weight: 166 lb (75.3 kg)   Height: 5' 8.5" (1.74 m)    Body mass  index is 24.87 kg/m.   Physical Exam Vitals and nursing note reviewed.  Constitutional:      Appearance: Normal appearance. He is well-developed.  HENT:     Head: Normocephalic and atraumatic.  Eyes:     Pupils: Pupils are equal, round, and reactive to light.  Neck:     Vascular: No carotid bruit.  Cardiovascular:     Rate and Rhythm: Normal rate and regular rhythm.     Pulses: Normal pulses.     Heart sounds: Normal heart sounds.  Pulmonary:  Effort: Pulmonary effort is normal.     Breath sounds: Normal breath sounds.  Abdominal:     Palpations: Abdomen is soft.  Musculoskeletal:     Cervical back: Normal range of motion and neck supple.     Right foot: Decreased range of motion. Tenderness present.     Left foot: Normal.  Lymphadenopathy:     Cervical: No cervical adenopathy.  Skin:    General: Skin is warm and dry.     Capillary Refill: Capillary refill takes less than 2 seconds.  Neurological:     General: No focal deficit present.     Mental Status: He is alert and oriented to person, place, and time.  Psychiatric:        Mood and Affect: Mood normal.        Behavior: Behavior normal.        Thought Content: Thought content normal.        Judgment: Judgment normal.      Assessment & Plan:  1. Right foot pain Will get x-ray of right foot. Encouraged him to apply ice to the foot when sore. Take tylenol as needed for pain. Consider referral to orthopedics for further evaluation.  - DG Foot 2 Views Right; Future  2. Coronary artery disease involving native coronary artery of native heart with other form of angina pectoris [facility]) Patient routinely sees cardiology. Check routine, fasting labs.  - Lipid panel - Comprehensive metabolic panel - CBC  3. Other fatigue Check thyroid panel for further evaluation.  - TSH + free T4  4. Mixed hyperlipidemia Check fasting lipids and cmp. Cardiology to manage lipid lowering medication.  - Lipid panel - Comprehensive  metabolic panel - CBC  5. Screening for prostate cancer Check PSA with fasting labs  - PSA  6. Healthcare maintenance Routine, fasting labs drawn during today's visit.  - Lipid panel - Comprehensive metabolic panel - CBC  7. Encounter to establish care Appointment today to establish new primary care provider     Problem List Items Addressed This Visit       Cardiovascular and Mediastinum   CAD (coronary artery disease)   Relevant Orders   Lipid panel (Completed)   Comprehensive metabolic panel (Completed)   CBC (Completed)     Other   Right foot pain - Primary   Relevant Orders   DG Foot 2 Views Right   Other fatigue   Relevant Orders   TSH + free T4 (Completed)   Mixed hyperlipidemia   Relevant Orders   Lipid panel (Completed)   Comprehensive metabolic panel (Completed)   CBC (Completed)   Other Visit Diagnoses     Screening for prostate cancer       Relevant Orders   PSA (Completed)   Healthcare maintenance       Relevant Orders   Lipid panel (Completed)   Comprehensive metabolic panel (Completed)   CBC (Completed)   Encounter to establish care           Return in about 3 months (around 01/24/2023) for medicare wellness.   Ronnell Freshwater, NP

## 2022-10-25 ENCOUNTER — Other Ambulatory Visit: Payer: Self-pay | Admitting: Cardiology

## 2022-10-25 LAB — COMPREHENSIVE METABOLIC PANEL
ALT: 18 IU/L (ref 0–44)
AST: 15 IU/L (ref 0–40)
Albumin/Globulin Ratio: 3 — ABNORMAL HIGH (ref 1.2–2.2)
Albumin: 4.5 g/dL (ref 3.8–4.8)
Alkaline Phosphatase: 55 IU/L (ref 44–121)
BUN/Creatinine Ratio: 11 (ref 10–24)
BUN: 15 mg/dL (ref 8–27)
Bilirubin Total: 1.8 mg/dL — ABNORMAL HIGH (ref 0.0–1.2)
CO2: 24 mmol/L (ref 20–29)
Calcium: 9.4 mg/dL (ref 8.6–10.2)
Chloride: 105 mmol/L (ref 96–106)
Creatinine, Ser: 1.36 mg/dL — ABNORMAL HIGH (ref 0.76–1.27)
Globulin, Total: 1.5 g/dL (ref 1.5–4.5)
Glucose: 104 mg/dL — ABNORMAL HIGH (ref 70–99)
Potassium: 4.9 mmol/L (ref 3.5–5.2)
Sodium: 142 mmol/L (ref 134–144)
Total Protein: 6 g/dL (ref 6.0–8.5)
eGFR: 55 mL/min/{1.73_m2} — ABNORMAL LOW (ref >59)

## 2022-10-25 LAB — LIPID PANEL
Chol/HDL Ratio: 2.7 ratio (ref 0.0–5.0)
Cholesterol, Total: 109 mg/dL (ref 100–199)
HDL: 41 mg/dL (ref >39)
LDL Chol Calc (NIH): 52 mg/dL (ref 0–99)
Triglycerides: 76 mg/dL (ref 0–149)
VLDL Cholesterol Cal: 16 mg/dL (ref 5–40)

## 2022-10-25 LAB — CBC
Hematocrit: 47.2 % (ref 37.5–51.0)
Hemoglobin: 16 g/dL (ref 13.0–17.7)
MCH: 30.7 pg (ref 26.6–33.0)
MCHC: 33.9 g/dL (ref 31.5–35.7)
MCV: 90 fL (ref 79–97)
Platelets: 169 10*3/uL (ref 150–450)
RBC: 5.22 x10E6/uL (ref 4.14–5.80)
RDW: 12.1 % (ref 11.6–15.4)
WBC: 5.7 10*3/uL (ref 3.4–10.8)

## 2022-10-25 LAB — TSH+FREE T4
Free T4: 1.11 ng/dL (ref 0.82–1.77)
TSH: 3.74 u[IU]/mL (ref 0.450–4.500)

## 2022-10-25 LAB — PSA: Prostate Specific Ag, Serum: 3.1 ng/mL (ref 0.0–4.0)

## 2022-11-05 DIAGNOSIS — E782 Mixed hyperlipidemia: Secondary | ICD-10-CM | POA: Insufficient documentation

## 2022-11-05 DIAGNOSIS — R5383 Other fatigue: Secondary | ICD-10-CM | POA: Insufficient documentation

## 2022-11-05 DIAGNOSIS — M79671 Pain in right foot: Secondary | ICD-10-CM | POA: Insufficient documentation

## 2022-11-07 DIAGNOSIS — Z85828 Personal history of other malignant neoplasm of skin: Secondary | ICD-10-CM | POA: Diagnosis not present

## 2022-11-07 DIAGNOSIS — L821 Other seborrheic keratosis: Secondary | ICD-10-CM | POA: Diagnosis not present

## 2022-11-07 DIAGNOSIS — L570 Actinic keratosis: Secondary | ICD-10-CM | POA: Diagnosis not present

## 2022-11-07 DIAGNOSIS — D692 Other nonthrombocytopenic purpura: Secondary | ICD-10-CM | POA: Diagnosis not present

## 2022-11-07 DIAGNOSIS — L812 Freckles: Secondary | ICD-10-CM | POA: Diagnosis not present

## 2022-11-07 NOTE — Progress Notes (Signed)
Cardiology Office Note   Date:  11/10/2022   ID:  Brandon Gonzales, DOB [birth year], MRN [id number]  PCP:  Dorna Mai, MD  Cardiologist:   Burl Tauzin Martinique, MD   Chief Complaint  Patient presents with   Coronary Artery Disease        History of Present Illness: Brandon Gonzales is a 71 y.o. male who presents for follow up CAD and HCM. He has a PMH including CAD prior mid LAD stenting in 2007 with  PTCA to D1, followed by lateral STEMI and PCI/DES to the midLCX & proximalLAD in 2013. S/p cutting balloon PTCA of the LAD in June 2019 for in stent restenosis.  He also has a h/o HTN, HL, GERD, and hypertrophic cardiomyopathy.     He called on 02/19/17 with complaints of dyspnea and dizziness that he states were similar to when he had his MI. He felt very flushed in his face. He was referred to the ED.  BP recorded by EMS was 210/107. Came down to 159/87 in ED.  Ecg showed NSR with a RBBB- new in January 2018. No acute change. Delta troponin was normal. UA, BMET, BNP were all normal. He was discharged on amlodipine 5 mg daily.    He was evaluated with Echo in April 2018 showing moderate LVH with severe basal septal hypertrophy. No significant outflow gradient. Mild MR. Myoview study showed a small area of scar at the base of the lateral wall. No ischemia. EF 51%. He presented in June 2018 with symptoms of worsening chest pain and dyspnea on exertion. He underwent cardiac cath showing 95% in stent restenosis of the proximal LAD treated with cutting  balloon angioplasty. There was a 65% mid RCA stenosis and 50% in the mid LCx. He was enrolled in the CLEAR trial for hyperlipidemia given history of statin and Zetia intolerance.   The patient was seen in the office 12/17/2019 after an episode of near syncope a couple on New Year's Day.  They had just eaten and he was in his car with his daughter (who is a paramedic) driving a plate of food over to his brother's house.  While driving he became weak and  near syncopal.  He denied any diaphoresis, shortness of breath, or nausea and vomiting.  His daughter took his pulse and thought he might be in atrial fibrillation but if it was present it did not last long.  His symptoms resolved quickly.  He did have a recurrent episode the following 2 days but none since.    A 7 day ZIO monitor was done and showed episodic bradycardia down to the 40"s- usually early am.  He also had frequent PVCs and short runs of PSVT.  In addition he had runs of NSWCT that appear to be NSVT. The longest was 14 beats.  He has had no further episodes of near syncope or symptomatic tachycardia.  Cardiac MRI was ordered with results noted below. Given syncopal episode, wide complex tachycardia noted on monitor, HCM with late gadolinium uptake on MRI and relatively low EF 52% I recommended EP evaluation to see if ICD indicated. He initially deferred. Seen by Dr Lovena Le on 03/23/20. He did eventually undergo ICD implant in April 2022.   He was admitted in October 2021 with a stroke. Had right arm and 4th and 5th finger numbness and weakness. Multiple left MCA infarcts embolic secondary to unknown source.CTA head & neck advanced atherosclerosis B ICA bifurcation and bulbs. B ICA 30% stenosis  w/ pronounced irregularity. Proximal R Subclavian 50%. R VA origin 30%. L VA origin 50%. Mid to distal BA. No LVO. MRI  showed Punctate L primary motor cortec and L subcortical parietal white matter infarcts. MR CS multilevel CX spondylosis w/ moderate and severe foraminal stenosis / narrowing C4-11. Echo showed no cardioembolic source. He was seen by EP for consideration of loop recorder but after discussion he did agree to have ICD implant. Since ICD implant no Afib noted. He was subsequently started on Praluent for his hyperlipidemia.   He was seen in the ED on 08/23/21 with chest pain. States he had severe chest pain radiating into his neck bilateral and it hurt to take a deep breath. This lasted a couple  of hours at least. Did not change with sl Ntg x 1. States his pain eventually wore off. BP was elevated at 200/104.  Patient's initial evaluation was started in the ED but patient did not stay he left the ED AMA. Overall labs were stable.  Troponin level was slightly elevated at 22 and EKG unchanged from prior. He states pain is different than when he had blockage before. Suspect this was more reflux.   On follow up today he feels great. He has completed building his house and moved in October. He is retired No chest pain or palpitations. Tolerating medication well.  No ICD discharges. Does note some discomfort on balls of feet. This has improved since he is not on his feet as much.   Past Medical History:  Diagnosis Date   Arthritis    Cancer ([place])    SKIN CA  SQUAMOUS CELL   Coronary artery disease    a. 2007 Stent to LAD, PTCA D1;  b. DES to LAD & LCx 10/13 in the setting of STEMI.   Dyslipidemia    a. Statin and zetia-intolerant. On niacin.   GERD (gastroesophageal reflux disease)    Heart murmur    Hyperlipidemia    Phreesia 11/22/2020   Hypertension    Hypertrophic cardiomyopathy ([place])    a. 09/2012 Echo: EF 55%, mid to apical anterolateral, posterior, apical anterior, apical HK, Gr1 DD, turbulence across LVOT suggesting degree of obstruction, mild MR, mildly dil LA.   Myocardial infarction ([place])    Phreesia 11/22/2020   Stroke [facility])    Phreesia 11/22/2020    Past Surgical History:  Procedure Laterality Date   CARDIAC CATHETERIZATION  2007   stent to LAD and PTCA of diagonal branch   CARDIAC CATHETERIZATION  2010   CORONARY ANGIOPLASTY WITH STENT PLACEMENT  2013   95-99% prox LAD, patent LAD stent distal to this, occluded mid-dist LCx, mild <10% RCA irreg; s/p DES-prox LAD & DES to mid-dist LCx   CORONARY BALLOON ANGIOPLASTY N/A 05/16/2017   Procedure: Coronary Balloon Angioplasty;  Surgeon: Leonie Man, MD;  Location: [facility];  Service: Cardiovascular;   Laterality: N/A;   [place]   right   ICD IMPLANT N/A 03/10/2021   Procedure: ICD IMPLANT;  Surgeon: Evans Lance, MD;  Location: [place] [place];  Service: Cardiovascular;  Laterality: N/A;   KNEE SURGERY  1993   left   LEFT HEART CATH Bilateral 09/22/2012   Procedure: LEFT HEART CATH;  Surgeon: Jahon Bart M Martinique, MD;  Location: [facility] CATH LAB;  Service: Cardiovascular;  Laterality: Bilateral;   LEFT HEART CATH AND CORONARY ANGIOGRAPHY N/A 05/16/2017   Procedure: Left Heart Cath and Coronary Angiography;  Surgeon: Leonie Man, MD;  Location: [facility];  Service: Cardiovascular;  Laterality: N/A;   PERCUTANEOUS CORONARY STENT INTERVENTION (PCI-S) Right 09/22/2012   Procedure: PERCUTANEOUS CORONARY STENT INTERVENTION (PCI-S);  Surgeon: Pelham Hennick M Martinique, MD;  Location: [facility] CATH LAB;  Service: Cardiovascular;  Laterality: Right;     Current Outpatient Medications  Medication Sig Dispense Refill   amLODipine (NORVASC) 2.5 MG tablet TAKE 1 TABLET BY MOUTH EVERY DAY 90 tablet 3   carvedilol (COREG) 12.5 MG tablet TAKE 0.5 TABLETS (6.25 MG TOTAL) BY MOUTH 2 (TWO) TIMES DAILY. 90 tablet 3   clopidogrel (PLAVIX) 75 MG tablet TAKE 1 TABLET BY MOUTH EVERY DAY 90 tablet 3   esomeprazole (NEXIUM) 20 MG capsule Take 20 mg by mouth every morning.     lisinopril (ZESTRIL) 40 MG tablet TAKE 1 TABLET BY MOUTH EVERY DAY 90 tablet 3   nitroGLYCERIN (NITROSTAT) 0.4 MG SL tablet Place 1 tablet (0.4 mg total) under the tongue every 5 (five) minutes as needed for chest pain. 25 tablet 2   oseltamivir (TAMIFLU) 30 MG capsule Take 1 capsule (30 mg total) by mouth 2 (two) times daily. 10 capsule 0   PRALUENT 150 MG/ML SOAJ INJECT 1 DOSE INTO THE SKIN EVERY 14 (FOURTEEN) DAYS. 6 mL 3   valACYclovir (VALTREX) 500 MG tablet Take 1 tablet (500 mg total) by mouth daily as needed (fever blisters). 90 tablet 0   No current facility-administered medications for this visit.    Allergies:    Antihistamines, chlorpheniramine-type; Pheniramine; Statins; and Zetia [ezetimibe]    Social History:  The patient  reports that he has never smoked. He has never been exposed to tobacco smoke. He has never used smokeless tobacco. He reports that he does not drink alcohol and does not use drugs.   Family History:  The patient's family history includes Heart attack (age of onset: 60) in his mother; Heart disease in his mother.    ROS:  Please see the history of present illness.   Otherwise, review of systems are positive for none.   All other systems are reviewed and negative.    PHYSICAL EXAM: VS:  BP 122/78   Pulse 70   Ht '5\' 8"'$  (1.727 m)   Wt 167 lb 3.2 oz (75.8 kg)   SpO2 99%   BMI 25.42 kg/m  , BMI Body mass index is 25.42 kg/m. GEN: Well nourished, well developed, in no acute distress  HEENT: normal  Neck: no JVD, carotid bruits, or masses Cardiac: RRR;  rubs, or gallops,no edema. Harsh gr 2/6 systolic murmur LSB and apex. Respiratory:  clear to auscultation bilaterally, normal work of breathing GI: soft, nontender, nondistended, + BS MS: no deformity or atrophy  Skin: warm and dry, no rash Neuro:  Strength and sensation are intact Psych: euthymic mood, full affect   EKG:  EKG is no ordered today.     Recent Labs: 10/24/2022: ALT 18; BUN 15; Creatinine, Ser 1.36; Hemoglobin 16.0; Platelets 169; Potassium 4.9; Sodium 142; TSH 3.740    Lipid Panel    Component Value Date/Time   CHOL 109 10/24/2022 1011   TRIG 76 10/24/2022 1011   HDL 41 10/24/2022 1011   CHOLHDL 2.7 10/24/2022 1011   CHOLHDL 7.7 09/16/2020 0428   VLDL 48 (H) 09/16/2020 0428   LDLCALC 52 10/24/2022 1011   LDLDIRECT 159.5 10/01/2012 0847      Wt Readings from Last 3 Encounters:  11/10/22 167 lb 3.2 oz (75.8 kg)  10/24/22 166 lb (75.3 kg)  07/31/22 168 lb (76.2 kg)      Other studies Reviewed: Additional studies/ records that were reviewed today include:   Cardiac cath 05/16/17:   Coronary Balloon Angioplasty  Left Heart Cath and Coronary Angiography  Conclusion    Ost RCA lesion, 40 %stenosed. Mid RCA lesion, 65 %stenosed. Borderline significant. The left ventricular systolic function is normal. LV end diastolic pressure is mildly elevated. There is no mitral valve regurgitation. There is no aortic valve stenosis. Prox LAD to Mid LAD bare-metal stent, 20 %stenosed. Mid LAD lesion, 40 %stenosed. Localized segment of previous bare-metal stent- 2nd Mrg DES stent, 0 %stenosed. Prox Cx to Mid Cx lesion, 40 %stenosed. Ost LAD lesion, 95 %stenosed. Post intervention, there is a 10% residual stenosis.   Mr. Whorley has several potential culprit lesions, however the most obvious one in the LAD with 95% stenosis. This is in-stent restenosis, therefore treated with balloon angioplasty using the [facility] Cutting Balloon followed by post-dilation with a noncompliant balloon.   The RCA lesion is of borderline significance, but does not appear to be acute in nature. Would defer intervention on this vessel at this time unless she has ongoing or worsening symptoms.     Plan: Overnight monitoring and likely discharge tomorrow Return to nursing unit for ongoing care and TR been removal. Back on dual antiplatelet therapy.- Reduced aspirin 81 mg him and using Brilinta. Further risk factor modification per rounding team.     He can follow-up with Dr. Martinique, will need to have a follow-up appointment rescheduled as he was post be there tomorrow June 14.       Glenetta Hew, M.D., M.S. Interventional Cardiologist      Echo 07/14/19: IMPRESSIONS     1. The left ventricle has normal systolic function with an ejection  fraction of 60-65%. The cavity size was normal. Severe basal septal  hypertrophy and mild concentric hypertrophy. Left ventricular diastolic  Doppler parameters are consistent with  pseudonormalization. Elevated left ventricular end-diastolic pressure.   2.  The average left ventricular global longitudinal strain is -16.0 %.   3. The right ventricle has normal systolic function. The cavity was  normal. There is no increase in right ventricular wall thickness.   4. Left atrial size was mildly dilated.   5. The aortic valve is tricuspid. Moderate sclerosis of the aortic valve.  Aortic valve regurgitation is mild by color flow Doppler.   6. The aorta is normal in size and structure.   Cardiac MRI 02/25/20: CLINICAL DATA:  Hypertrophic cardiomyopathy suspected, further testing   COMPARISON: Echocardiogram 07/14/2019   EXAM: CARDIAC MRI   TECHNIQUE: The patient was scanned on a 1.5 Tesla GE magnet. A dedicated cardiac coil was used. Functional imaging was done using Fiesta sequences. 2,3, and 4 chamber views were done to assess for RWMA's. Modified Simpson's rule using a short axis stack was used to calculate an ejection fraction on a dedicated work Conservation officer, nature. The patient received 52m GADAVIST GADOBUTROL 1 MMOL/ML IV SOLN. After 10 minutes inversion recovery sequences were used to assess for infiltration and scar tissue.   CONTRAST:  124mGADAVIST GADOBUTROL 1 MMOL/ML IV SOLN   FINDINGS: LEFT VENTRICLE:   Normal LV chamber size.   Wall thickness is increased in the basal septum as described below. Wall thinning in the anterior wall and inferolateral wall. The lateral wall demonstrates scar and is significantly thinned.   Akinesis of the inferolateral wall from base to mid ventricle, and hypokinesis of lateral  apex. Hypokinesis of the anterior wall from base to apex.   LVEF = 52%   Maximal wall thickness: 18 mm   Location: Basal anteroseptum   There is systolic anterior motion of the mitral valve. Flow dephasing suggests left ventricular outflow tract obstruction. Mitral regurgitation is seen, posteriorly directed, eccentric and qualitatively mild-moderate.   No evidence of left ventricular apical  aneurysm.   Findings are consistent with hypertrophic cardiomyopathy with obstruction. Morphologic subtype: Sigmoid.   There is late gadolinium enhancement in the left ventricular myocardium.   LGE noted subendocardium of the inferolateral and the inferior portion of the anterolateral wall from base to mid ventricle, including a mildly aneurysmal appearing inferolateral mid ventricular segment. Delayed myocardial enhancement is <50% of the thickness of the lateral wall, suggesting viability in this coronary distribution.   There is also LGE in the subendocardium of the anterior wall from base to mid ventricle. Delayed myocardial enhancement is <50% of the thickness of the anterior wall, suggesting viability in this coronary distribution.   Mild delayed myocardial enhancement noted at the RV insertion points. This represents <1% of the myocardial mass. Quantitation of LGE not performed in the setting of prominent delayed gadolinium enhancement from ischemic heart disease.   Normal T1 myocardial nulling kinetics suggests against a diagnosis of cardiac amyloidosis.   ECV = 22%, normal.   RIGHT VENTRICLE:   Normal right ventricular size, thickness and systolic function (RVEF =63%). There are no regional wall motion abnormalities.   ATRIA:   Mild left atrial enlargement.  Normal right atrial chamber size.   VALVES:   Mild-moderate mitral valve regurgitation. Due to systolic anterior motion by flow dephasing.   Trivial aortic valve regurgitation by flow dephasing.   PERICARDIUM:   Normal pericardium.  Trace circumferential pericardial effusion.   OTHER: No significant extracardiac findings noted.   MEASUREMENTS:   LVEDV: 174 mL   LVESV: 83 mL   SV: 91 mL   CO: 5.5 L/min   Myocardial mass: 160 g   RVEDV: 121 mL   RVEDS: 49 mL   RVSV: 72 mL   IMPRESSION: 1.  Normal left ventricular size, LVEF 52%.   2. Akinesis of the basal-mid inferolateral wall with  scar and wall thinning. Hypokinesis of the basal-apical anterior wall. Subendocardial delayed enhancement that is <50% thickness of the myocardium, suggestive of ischemic heart disease with viability in these regions.   3. Hypertrophic cardiomyopathy, sigmoid subtype. 18 mm basal anteroseptum.   4. Left ventricular outflow tract obstruction with systolic anterior motion of the mitral valve and qualitatively mild-moderate mitral valve regurgitation.   5. Mild myocardial enhancement in the RV insertion points at the base. No significant delayed myocardial enhancement associated with area of myocardial thickening.   6. No evidence of cardiac amyloidosis. Normal extracellular volume, 22%.   7.  Normal right ventricular size and function, RVEF 60%   8.  Trace pericardial effusion.     Electronically Signed   By: Cherlynn Kaiser   On: 02/29/2020 18:43   Echo 09/16/20: IMPRESSIONS     1. Left ventricular ejection fraction, by estimation, is 65 to 70%. The  left ventricle has normal function. The left ventricle has no regional  wall motion abnormalities. There is moderate concentric left ventricular  hypertrophy. Left ventricular  diastolic parameters are consistent with Grade I diastolic dysfunction  (impaired relaxation).   2. Right ventricular systolic function is normal. The right ventricular  size is normal.   3. Left atrial size was  moderately dilated.   4. The mitral valve is normal in structure. Trivial mitral valve  regurgitation. No evidence of mitral stenosis.   5. The aortic valve is normal in structure. There is mild calcification  of the aortic valve. There is mild thickening of the aortic valve. Aortic  valve regurgitation is mild. No aortic stenosis is present.   6. The inferior vena cava is normal in size with greater than 50%  respiratory variability, suggesting right atrial pressure of 3 mmHg.   Conclusion(s)/Recommendation(s): No intracardiac source of  embolism  detected on this transthoracic study. A transesophageal echocardiogram is  recommended to exclude cardiac source of embolism if clinically indicated.   ASSESSMENT AND PLAN:    1.Hypertrophic CM:  euvolemic on exam.  Most recent Echo in Oct 2021 showed no significant LVOT gradient.  Moderate LVH and normal EF. Late gadolinium enhancement. He has NSVT noted on monitor. Now s/p ICD implant. Clinically doing well.    2.  CAD: s/p prior LAD, D1, and LCX PCI with STEMI in 2013. Admitted in June 2018 with in stent restenosis of the proximal LAD. Symptoms predominantly of DOE. Treated with cutting balloon PCI. Interestedly his Myoview study prior was normal so may not be a reliable study.  Currently without angina. Continue medical therapy   3.  HL:   intolerant to statins and zetia. On CLEAR trial now. LDL was 155. Now on Praluent. Excellent response with LDL 54.    4.  Hypertension:  BP is well controlled.    5.  S/p CVA October 2021. Likely related to embolus nonobstructive but extensive carotid arterial disease.    .      Current medicines are reviewed at length with the patient today.  The patient does not have concerns regarding medicines.  The following changes have been made:  no change  Labs/ tests ordered today include:  No orders of the defined types were placed in this encounter.     Disposition:   FU with me in 6 months.  Signed, Malachy Coleman Martinique, MD  11/10/2022 9:20 AM    [facility] [address], [place], [place], [address] Phone [phone], Fax [phone]

## 2022-11-10 ENCOUNTER — Encounter: Payer: Self-pay | Admitting: Cardiology

## 2022-11-10 ENCOUNTER — Ambulatory Visit: Payer: Medicare HMO | Attending: Cardiology | Admitting: Cardiology

## 2022-11-10 VITALS — BP 122/78 | HR 70 | Ht 68.0 in | Wt 167.2 lb

## 2022-11-10 DIAGNOSIS — Z9861 Coronary angioplasty status: Secondary | ICD-10-CM | POA: Diagnosis not present

## 2022-11-10 DIAGNOSIS — E785 Hyperlipidemia, unspecified: Secondary | ICD-10-CM | POA: Diagnosis not present

## 2022-11-10 DIAGNOSIS — I2510 Atherosclerotic heart disease of native coronary artery without angina pectoris: Secondary | ICD-10-CM

## 2022-11-10 DIAGNOSIS — I4729 Other ventricular tachycardia: Secondary | ICD-10-CM | POA: Diagnosis not present

## 2022-11-10 DIAGNOSIS — I495 Sick sinus syndrome: Secondary | ICD-10-CM | POA: Diagnosis not present

## 2022-11-10 DIAGNOSIS — Z95810 Presence of automatic (implantable) cardiac defibrillator: Secondary | ICD-10-CM | POA: Diagnosis not present

## 2022-11-10 DIAGNOSIS — I422 Other hypertrophic cardiomyopathy: Secondary | ICD-10-CM | POA: Diagnosis not present

## 2022-11-10 NOTE — Patient Instructions (Signed)
Medication Instructions:  The current medical regimen is effective;  continue present plan and medications.  *If you need a refill on your cardiac medications before your next appointment, please call your pharmacy*   Follow-Up: At [facility], you and your health needs are our priority.  As part of our continuing mission to provide you with exceptional heart care, we have created designated Provider Care Teams.  These Care Teams include your primary Cardiologist (physician) and Advanced Practice Providers (APPs -  Physician Assistants and Nurse Practitioners) who all work together to provide you with the care you need, when you need it.  We recommend signing up for the patient portal called "MyChart".  Sign up information is provided on this After Visit Summary.  MyChart is used to connect with patients for Virtual Visits (Telemedicine).  Patients are able to view lab/test results, encounter notes, upcoming appointments, etc.  Non-urgent messages can be sent to your provider as well.   To learn more about what you can do with MyChart, go to [email or web address].    Your next appointment:   6 month(s)  The format for your next appointment:   In Person  Provider:   Peter Jordan, MD         

## 2022-11-12 ENCOUNTER — Other Ambulatory Visit: Payer: Self-pay | Admitting: Family

## 2022-11-12 DIAGNOSIS — B001 Herpesviral vesicular dermatitis: Secondary | ICD-10-CM

## 2022-11-14 ENCOUNTER — Telehealth: Payer: Self-pay | Admitting: Family Medicine

## 2022-11-14 NOTE — Telephone Encounter (Signed)
Spoke with patient to schedule AWV  He stated she has changed PCP to [facility]

## 2022-11-15 ENCOUNTER — Ambulatory Visit: Payer: Self-pay

## 2022-11-15 NOTE — Patient Outreach (Signed)
  Care Coordination   11/15/2022 Name: Brandon Gonzales MRN: [id number] DOB: [birth year]   Care Coordination Outreach Attempts:  An unsuccessful telephone outreach was attempted today to offer the patient information about available care coordination services as a benefit of their health plan.   Follow Up Plan:  Additional outreach attempts will be made to offer the patient care coordination information and services.   Encounter Outcome:  No Answer   Care Coordination Interventions:  No, not indicated    Daneen Schick, BSW, CDP Social Worker, Certified Dementia Practitioner [facility]  Care Coordination [phone]

## 2022-11-16 ENCOUNTER — Telehealth: Payer: Self-pay | Admitting: *Deleted

## 2022-11-16 ENCOUNTER — Other Ambulatory Visit: Payer: Self-pay | Admitting: Nurse Practitioner

## 2022-11-16 DIAGNOSIS — B001 Herpesviral vesicular dermatitis: Secondary | ICD-10-CM

## 2022-11-16 MED ORDER — VALACYCLOVIR HCL 500 MG PO TABS: 500.0000 mg | ORAL_TABLET | Freq: Every day | ORAL | 0 refills | Status: DC

## 2022-11-16 NOTE — Telephone Encounter (Signed)
I have sent this to [employer]

## 2022-11-16 NOTE — Telephone Encounter (Signed)
Pt calling and requesting a refill on below.   Medication: valACYclovir (VALTREX) 500 MG tablet   Pharmacy: [facility] [id number] - [place], [place]   LOV: 10/24/22 ROV: Not scheduled yet, due in February

## 2022-12-08 ENCOUNTER — Ambulatory Visit (INDEPENDENT_AMBULATORY_CARE_PROVIDER_SITE_OTHER): Payer: Medicare HMO

## 2022-12-08 DIAGNOSIS — I495 Sick sinus syndrome: Secondary | ICD-10-CM | POA: Diagnosis not present

## 2022-12-08 LAB — CUP PACEART REMOTE DEVICE CHECK
Battery Remaining Longevity: 83 mo
Battery Remaining Percentage: 81 %
Battery Voltage: 3.01 V
Brady Statistic AP VP Percent: 1 %
Brady Statistic AP VS Percent: 48 %
Brady Statistic AS VP Percent: 1 %
Brady Statistic AS VS Percent: 52 %
Brady Statistic RA Percent Paced: 46 %
Brady Statistic RV Percent Paced: 1 %
Date Time Interrogation Session: 20240104210231
HighPow Impedance: 78 Ohm
Implantable Lead Connection Status: 753985
Implantable Lead Connection Status: 753985
Implantable Lead Implant Date: 20220407
Implantable Lead Implant Date: 20220407
Implantable Lead Location: 753859
Implantable Lead Location: 753860
Implantable Lead Model: 7122
Implantable Pulse Generator Implant Date: 20220407
Lead Channel Impedance Value: 390 Ohm
Lead Channel Impedance Value: 590 Ohm
Lead Channel Pacing Threshold Amplitude: 0.75 V
Lead Channel Pacing Threshold Amplitude: 0.75 V
Lead Channel Pacing Threshold Pulse Width: 0.5 ms
Lead Channel Pacing Threshold Pulse Width: 0.5 ms
Lead Channel Sensing Intrinsic Amplitude: 1.4 mV
Lead Channel Sensing Intrinsic Amplitude: 11.8 mV
Lead Channel Setting Pacing Amplitude: 2 V
Lead Channel Setting Pacing Amplitude: 2.5 V
Lead Channel Setting Pacing Pulse Width: 0.5 ms
Lead Channel Setting Sensing Sensitivity: 0.5 mV
Pulse Gen Serial Number: 810023633
Zone Setting Status: 755011

## 2022-12-25 NOTE — Progress Notes (Signed)
Remote ICD transmission.   

## 2023-01-09 ENCOUNTER — Other Ambulatory Visit: Payer: Self-pay | Admitting: Cardiology

## 2023-01-23 ENCOUNTER — Telehealth: Payer: Medicare HMO | Admitting: Physician Assistant

## 2023-01-23 DIAGNOSIS — J019 Acute sinusitis, unspecified: Secondary | ICD-10-CM | POA: Diagnosis not present

## 2023-01-23 DIAGNOSIS — B9689 Other specified bacterial agents as the cause of diseases classified elsewhere: Secondary | ICD-10-CM

## 2023-01-23 MED ORDER — DOXYCYCLINE HYCLATE 100 MG PO TABS: 100.0000 mg | ORAL_TABLET | Freq: Two times a day (BID) | ORAL | 0 refills | Status: AC

## 2023-01-23 MED ORDER — FLUTICASONE PROPIONATE 50 MCG/ACT NA SUSP
2.0000 | Freq: Every day | NASAL | 6 refills | Status: DC
Start: 2023-01-23 — End: 2023-07-24

## 2023-01-23 MED ORDER — BENZONATATE 100 MG PO CAPS: 100.0000 mg | ORAL_CAPSULE | Freq: Three times a day (TID) | ORAL | 0 refills | Status: AC | PRN

## 2023-01-23 NOTE — Progress Notes (Signed)
Virtual Visit Consent   Brandon Gonzales, you are scheduled for a virtual visit with a [facility] provider today. Just as with appointments in the office, your consent must be obtained to participate. Your consent will be active for this visit and any virtual visit you may have with one of our providers in the next 365 days. If you have a MyChart account, a copy of this consent can be sent to you electronically.  As this is a virtual visit, video technology does not allow for your provider to perform a traditional examination. This may limit your provider's ability to fully assess your condition. If your provider identifies any concerns that need to be evaluated in person or the need to arrange testing (such as labs, EKG, etc.), we will make arrangements to do so. Although advances in technology are sophisticated, we cannot ensure that it will always work on either your end or our end. If the connection with a video visit is poor, the visit may have to be switched to a telephone visit. With either a video or telephone visit, we are not always able to ensure that we have a secure connection.  By engaging in this virtual visit, you consent to the provision of healthcare and authorize for your insurance to be billed (if applicable) for the services provided during this visit. Depending on your insurance coverage, you may receive a charge related to this service.  I need to obtain your verbal consent now. Are you willing to proceed with your visit today? Brandon Gonzales has provided verbal consent on 01/23/2023 for a virtual visit (video or telephone). Lenise Arena Ward, PA-C  Date: 01/23/2023 4:07 PM  Virtual Visit via Video Note   I, Lenise Arena Ward, connected with  Brandon Gonzales  ([id number], [birth year]) on 01/23/23 at  4:00 PM EST by a video-enabled telemedicine application and verified that I am speaking with the correct person using two identifiers.  Location: Patient: Virtual Visit Location Patient:  Home Provider: Virtual Visit Location Provider: Home Office   I discussed the limitations of evaluation and management by telemedicine and the availability of in person appointments. The patient expressed understanding and agreed to proceed.    History of Present Illness: Brandon Gonzales is a 72 y.o. who identifies as a male who was assigned male at birth, and is being seen today for congestion, sinus pressure and pain, decreased energy, headache, sore throat.  Sx started about 5 days ago. Reports fever over the weekend.  Reports he is taking tylenol with temporary relief and otc cold and cough medications. Reports associated cough, that is worse at night.  Denies shortness of breath or wheezing.  Denis sick contacts. He reports frequent sinus infections.  Requesting antibiotics.   HPI: HPI  Problems:  Patient Active Problem List   Diagnosis Date Noted   Right foot pain 11/05/2022   Other fatigue 11/05/2022   Mixed hyperlipidemia 11/05/2022   ICD (implantable cardioverter-defibrillator) in place 06/21/2021   Acute ischemic stroke ([place]) 09/15/2020   Near syncope 12/17/2019   Statin myopathy 11/13/2019   Herpes labialis 01/31/2018   Essential hypertension    Heart murmur    GERD (gastroesophageal reflux disease)    CAD S/P percutaneous coronary angioplasty    Cancer ([place])    Angina at rest 05/17/2017   Hypertrophic cardiomyopathy ([place])    S/P PTCA (percutaneous transluminal coronary angioplasty)    Unstable angina (HCC)    Positional vertigo 04/24/2016   NSVT (nonsustained  ventricular tachycardia) ([place]) 09/25/2012   Hypertrophic obstructive cardiomyopathy(425.11) 09/25/2012   CKD (chronic kidney disease) stage 3, GFR 30-59 ml/min (HCC) 09/25/2012   ST elevation myocardial infarction (STEMI) of lateral wall (HCC) 09/22/2012   CAD (coronary artery disease) 01/11/2012   Dyslipidemia    H/O gastroesophageal reflux (GERD)    Arthritis     Allergies:  Allergies  Allergen Reactions    Antihistamines, Chlorpheniramine-Type Other (See Comments)    Altered mental status   Pheniramine Other (See Comments)    Altered mental status   Statins Other (See Comments)    Leg cramps     Zetia [Ezetimibe] Other (See Comments)    Leg cramps   Medications:  Current Outpatient Medications:    benzonatate (TESSALON) 100 MG capsule, Take 1 capsule (100 mg total) by mouth 3 (three) times daily as needed for up to 10 days for cough., Disp: 20 capsule, Rfl: 0   doxycycline (VIBRA-TABS) 100 MG tablet, Take 1 tablet (100 mg total) by mouth 2 (two) times daily for 5 days., Disp: 10 tablet, Rfl: 0   fluticasone (FLONASE) 50 MCG/ACT nasal spray, Place 2 sprays into both nostrils daily., Disp: 16 g, Rfl: 6   amLODipine (NORVASC) 2.5 MG tablet, TAKE 1 TABLET BY MOUTH EVERY DAY, Disp: 90 tablet, Rfl: 3   carvedilol (COREG) 12.5 MG tablet, TAKE 0.5 TABLETS (6.25 MG TOTAL) BY MOUTH 2 (TWO) TIMES DAILY., Disp: 90 tablet, Rfl: 3   clopidogrel (PLAVIX) 75 MG tablet, TAKE 1 TABLET BY MOUTH EVERY DAY, Disp: 90 tablet, Rfl: 3   esomeprazole (NEXIUM) 20 MG capsule, Take 20 mg by mouth every morning., Disp: , Rfl:    lisinopril (ZESTRIL) 40 MG tablet, TAKE 1 TABLET BY MOUTH EVERY DAY, Disp: 90 tablet, Rfl: 3   nitroGLYCERIN (NITROSTAT) 0.4 MG SL tablet, Place 1 tablet (0.4 mg total) under the tongue every 5 (five) minutes as needed for chest pain., Disp: 25 tablet, Rfl: 2   oseltamivir (TAMIFLU) 30 MG capsule, Take 1 capsule (30 mg total) by mouth 2 (two) times daily., Disp: 10 capsule, Rfl: 0   PRALUENT 150 MG/ML SOAJ, INJECT 1 DOSE INTO THE SKIN EVERY 14 (FOURTEEN) DAYS., Disp: 6 mL, Rfl: 3   valACYclovir (VALTREX) 500 MG tablet, Take 1 tablet (500 mg total) by mouth daily., Disp: 90 tablet, Rfl: 0  Observations/Objective: Patient is well-developed, well-nourished in no acute distress.  Resting comfortably at home.  Head is normocephalic, atraumatic.  No labored breathing.  Speech is clear and coherent  with logical content.  Patient is alert and oriented at baseline.    Assessment and Plan: 1. Acute bacterial sinusitis - doxycycline (VIBRA-TABS) 100 MG tablet; Take 1 tablet (100 mg total) by mouth 2 (two) times daily for 5 days.  Dispense: 10 tablet; Refill: 0 - fluticasone (FLONASE) 50 MCG/ACT nasal spray; Place 2 sprays into both nostrils daily.  Dispense: 16 g; Refill: 6 - benzonatate (TESSALON) 100 MG capsule; Take 1 capsule (100 mg total) by mouth 3 (three) times daily as needed for up to 10 days for cough.  Dispense: 20 capsule; Refill: 0  Advised continued supportive care.  IN person evaluation precautions discussed. Pt in no acute distress, speaking in complete sentences.   Follow Up Instructions: I discussed the assessment and treatment plan with the patient. The patient was provided an opportunity to ask questions and all were answered. The patient agreed with the plan and demonstrated an understanding of the instructions.  A copy of instructions were  sent to the patient via MyChart unless otherwise noted below.     The patient was advised to call back or seek an in-person evaluation if the symptoms worsen or if the condition fails to improve as anticipated.  Time:  I spent 16 minutes with the patient via telehealth technology discussing the above problems/concerns.    Lenise Arena Ward, PA-C

## 2023-01-23 NOTE — Patient Instructions (Signed)
Kelle Darting Manuele, thank you for joining [place], PA-C for today's virtual visit.  While this provider is not your primary care provider (PCP), if your PCP is located in our provider database this encounter information will be shared with them immediately following your visit.   [facility] account gives you access to today's visit and all your visits, tests, and labs performed at [facility] " click here if you don't have a [facility] account or go to [email or web address]  Consent: (Patient) Brandon Gonzales provided verbal consent for this virtual visit at the beginning of the encounter.  Current Medications:  Current Outpatient Medications:    benzonatate (TESSALON) 100 MG capsule, Take 1 capsule (100 mg total) by mouth 3 (three) times daily as needed for up to 10 days for cough., Disp: 20 capsule, Rfl: 0   doxycycline (VIBRA-TABS) 100 MG tablet, Take 1 tablet (100 mg total) by mouth 2 (two) times daily for 5 days., Disp: 10 tablet, Rfl: 0   fluticasone (FLONASE) 50 MCG/ACT nasal spray, Place 2 sprays into both nostrils daily., Disp: 16 g, Rfl: 6   amLODipine (NORVASC) 2.5 MG tablet, TAKE 1 TABLET BY MOUTH EVERY DAY, Disp: 90 tablet, Rfl: 3   carvedilol (COREG) 12.5 MG tablet, TAKE 0.5 TABLETS (6.25 MG TOTAL) BY MOUTH 2 (TWO) TIMES DAILY., Disp: 90 tablet, Rfl: 3   clopidogrel (PLAVIX) 75 MG tablet, TAKE 1 TABLET BY MOUTH EVERY DAY, Disp: 90 tablet, Rfl: 3   esomeprazole (NEXIUM) 20 MG capsule, Take 20 mg by mouth every morning., Disp: , Rfl:    lisinopril (ZESTRIL) 40 MG tablet, TAKE 1 TABLET BY MOUTH EVERY DAY, Disp: 90 tablet, Rfl: 3   nitroGLYCERIN (NITROSTAT) 0.4 MG SL tablet, Place 1 tablet (0.4 mg total) under the tongue every 5 (five) minutes as needed for chest pain., Disp: 25 tablet, Rfl: 2   oseltamivir (TAMIFLU) 30 MG capsule, Take 1 capsule (30 mg total) by mouth 2 (two) times daily., Disp: 10 capsule, Rfl: 0   PRALUENT 150 MG/ML SOAJ,  INJECT 1 DOSE INTO THE SKIN EVERY 14 (FOURTEEN) DAYS., Disp: 6 mL, Rfl: 3   valACYclovir (VALTREX) 500 MG tablet, Take 1 tablet (500 mg total) by mouth daily., Disp: 90 tablet, Rfl: 0   Medications ordered in this encounter:  Meds ordered this encounter  Medications   doxycycline (VIBRA-TABS) 100 MG tablet    Sig: Take 1 tablet (100 mg total) by mouth 2 (two) times daily for 5 days.    Dispense:  10 tablet    Refill:  0    Order Specific Question:   Supervising Provider    Answer:   Chase Picket [id number]   fluticasone (FLONASE) 50 MCG/ACT nasal spray    Sig: Place 2 sprays into both nostrils daily.    Dispense:  16 g    Refill:  6    Order Specific Question:   Supervising Provider    Answer:   Chase Picket [id number]   benzonatate (TESSALON) 100 MG capsule    Sig: Take 1 capsule (100 mg total) by mouth 3 (three) times daily as needed for up to 10 days for cough.    Dispense:  20 capsule    Refill:  0    Order Specific Question:   Supervising Provider    Answer:   Chase Picket [id number]     *If you need refills on other medications prior to your next appointment, please contact your  pharmacy*  Follow-Up: Call back or seek an in-person evaluation if the symptoms worsen or if the condition fails to improve as anticipated.  [facility] [phone]  Other Instructions Take tessalon as needed for cough.  Use flonase once daily.  Can continue with tylenol or ibuprofen as needed.  Drink plenty of fluids, rest. If you develop worsening symptoms or no improvement follow up with your PCP or Urgent Care for in person evaluation.    If you have been instructed to have an in-person evaluation today at a local Urgent Care facility, please use the link below. It will take you to a list of all of our available [facility], including address, phone number and hours of operation. Please do not delay care.  [facility]  If you or a family  member do not have a primary care provider, use the link below to schedule a visit and establish care. When you choose a [facility] primary care physician or advanced practice provider, you gain a long-term partner in health. Find a Primary Care Provider  Learn more about [facility]'s in-office and virtual care options: [facility]

## 2023-02-05 ENCOUNTER — Other Ambulatory Visit: Payer: Self-pay | Admitting: Internal Medicine

## 2023-02-05 ENCOUNTER — Telehealth: Payer: Self-pay | Admitting: Pharmacist

## 2023-02-05 MED ORDER — REPATHA SURECLICK 140 MG/ML ~~LOC~~ SOAJ: 1.0000 | SUBCUTANEOUS | 11 refills | Status: DC

## 2023-02-05 NOTE — Telephone Encounter (Signed)
Received fax from pharmacy that [place] is no longer covered by pt's insurance and [facility] is preferred. PA submitted for [facility], Key: [id number], request approved through 12/04/23. Pt aware of med change.

## 2023-02-05 NOTE — Telephone Encounter (Signed)
Routed to PharmD Pool 

## 2023-03-12 ENCOUNTER — Ambulatory Visit (INDEPENDENT_AMBULATORY_CARE_PROVIDER_SITE_OTHER): Payer: Medicare HMO

## 2023-03-12 DIAGNOSIS — I495 Sick sinus syndrome: Secondary | ICD-10-CM | POA: Diagnosis not present

## 2023-03-12 LAB — CUP PACEART REMOTE DEVICE CHECK
Battery Remaining Longevity: 80 mo
Battery Remaining Percentage: 78 %
Battery Voltage: 3.01 V
Brady Statistic AP VP Percent: 1 %
Brady Statistic AP VS Percent: 47 %
Brady Statistic AS VP Percent: 1 %
Brady Statistic AS VS Percent: 52 %
Brady Statistic RA Percent Paced: 45 %
Brady Statistic RV Percent Paced: 1 %
Date Time Interrogation Session: 20240407220613
HighPow Impedance: 69 Ohm
Implantable Lead Connection Status: 753985
Implantable Lead Connection Status: 753985
Implantable Lead Implant Date: 20220407
Implantable Lead Implant Date: 20220407
Implantable Lead Location: 753859
Implantable Lead Location: 753860
Implantable Lead Model: 7122
Implantable Pulse Generator Implant Date: 20220407
Lead Channel Impedance Value: 350 Ohm
Lead Channel Impedance Value: 530 Ohm
Lead Channel Pacing Threshold Amplitude: 0.75 V
Lead Channel Pacing Threshold Amplitude: 0.75 V
Lead Channel Pacing Threshold Pulse Width: 0.5 ms
Lead Channel Pacing Threshold Pulse Width: 0.5 ms
Lead Channel Sensing Intrinsic Amplitude: 1.8 mV
Lead Channel Sensing Intrinsic Amplitude: 11.5 mV
Lead Channel Setting Pacing Amplitude: 2 V
Lead Channel Setting Pacing Amplitude: 2.5 V
Lead Channel Setting Pacing Pulse Width: 0.5 ms
Lead Channel Setting Sensing Sensitivity: 0.5 mV
Pulse Gen Serial Number: 810023633
Zone Setting Status: 755011

## 2023-03-27 ENCOUNTER — Telehealth: Payer: Self-pay

## 2023-03-27 NOTE — Telephone Encounter (Signed)
Called patient to schedule Medicare Annual Wellness Visit (AWV). Unable to reach patient.  Last date of AWV: 11/24/20  Please schedule an appointment at any time on Annual Wellness visit schedule.

## 2023-04-18 NOTE — Progress Notes (Signed)
Remote ICD transmission.   

## 2023-05-05 IMAGING — DX DG CHEST 2V
2 series · 2 of 2 positions shown · non-contrast
Comparison: 03/10/2021

CLINICAL DATA: Chest pain

EXAM:
CHEST - 2 VIEW

[chest lat]
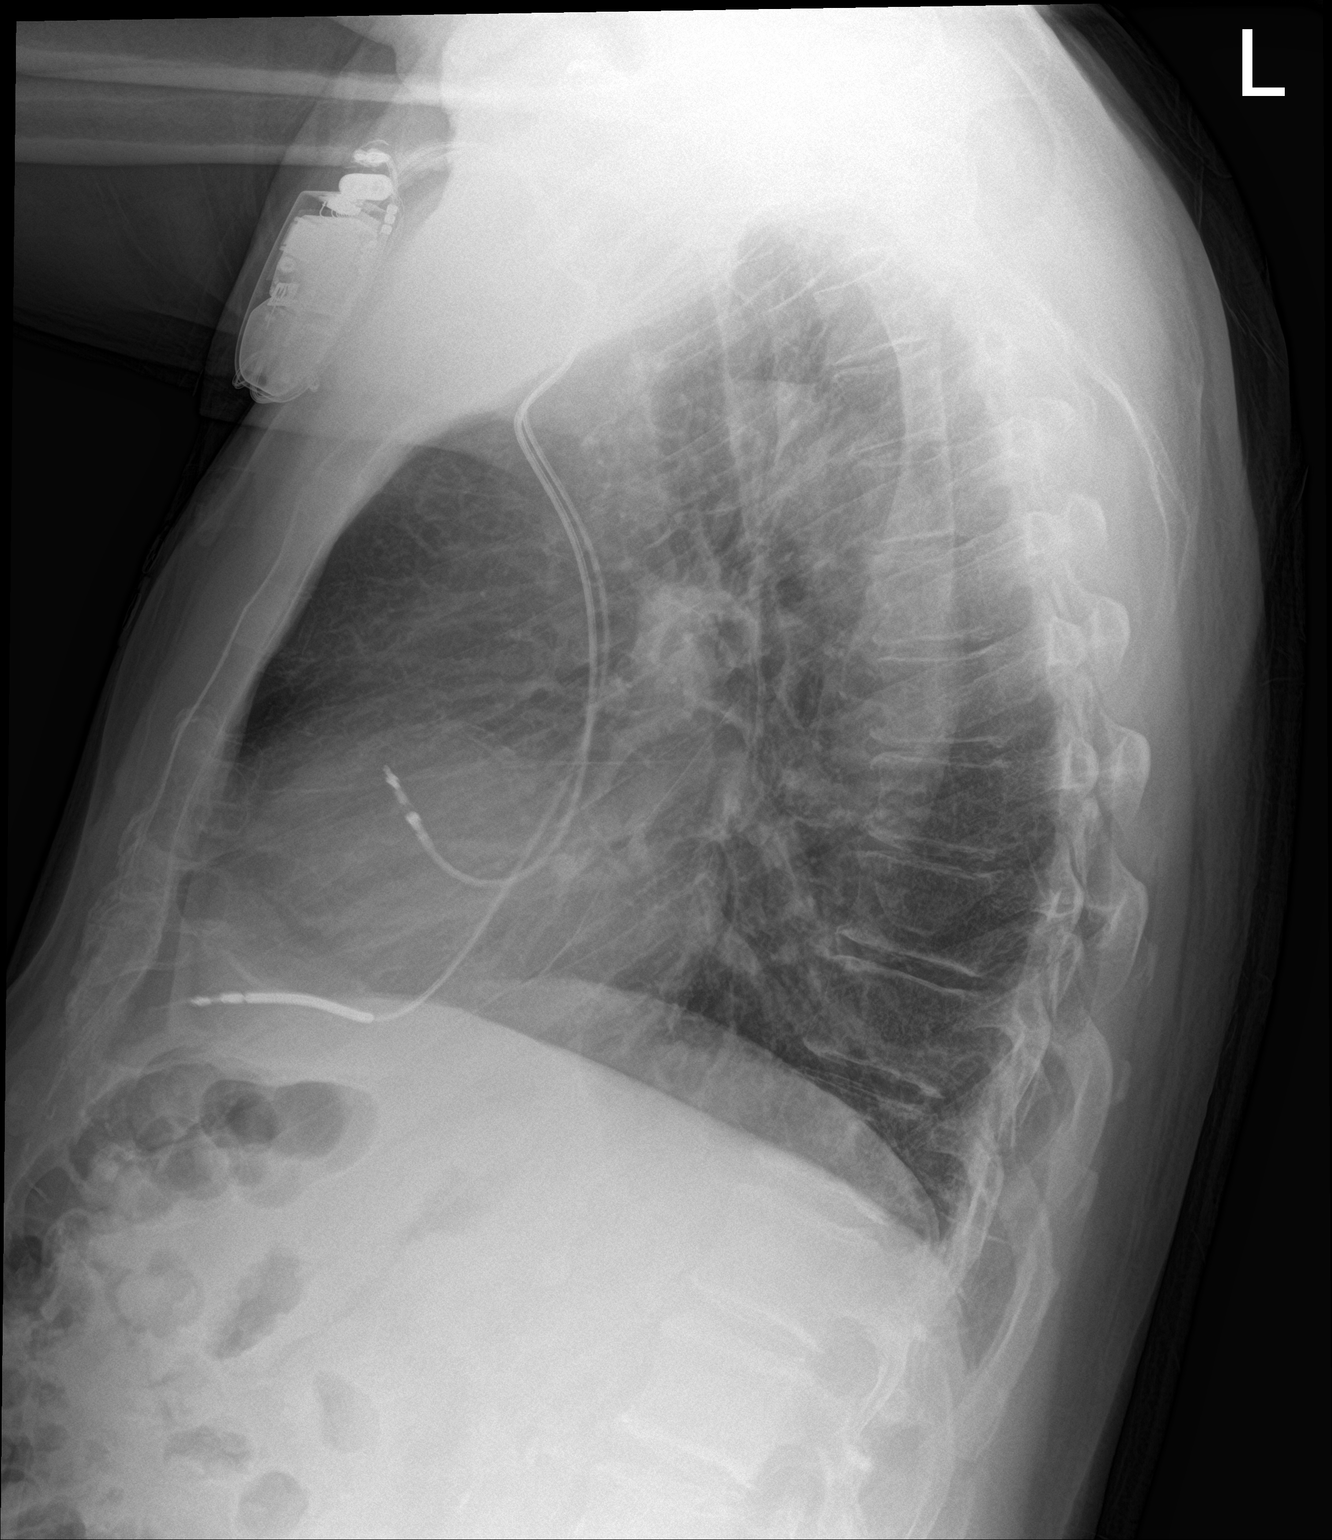

[chest pa]
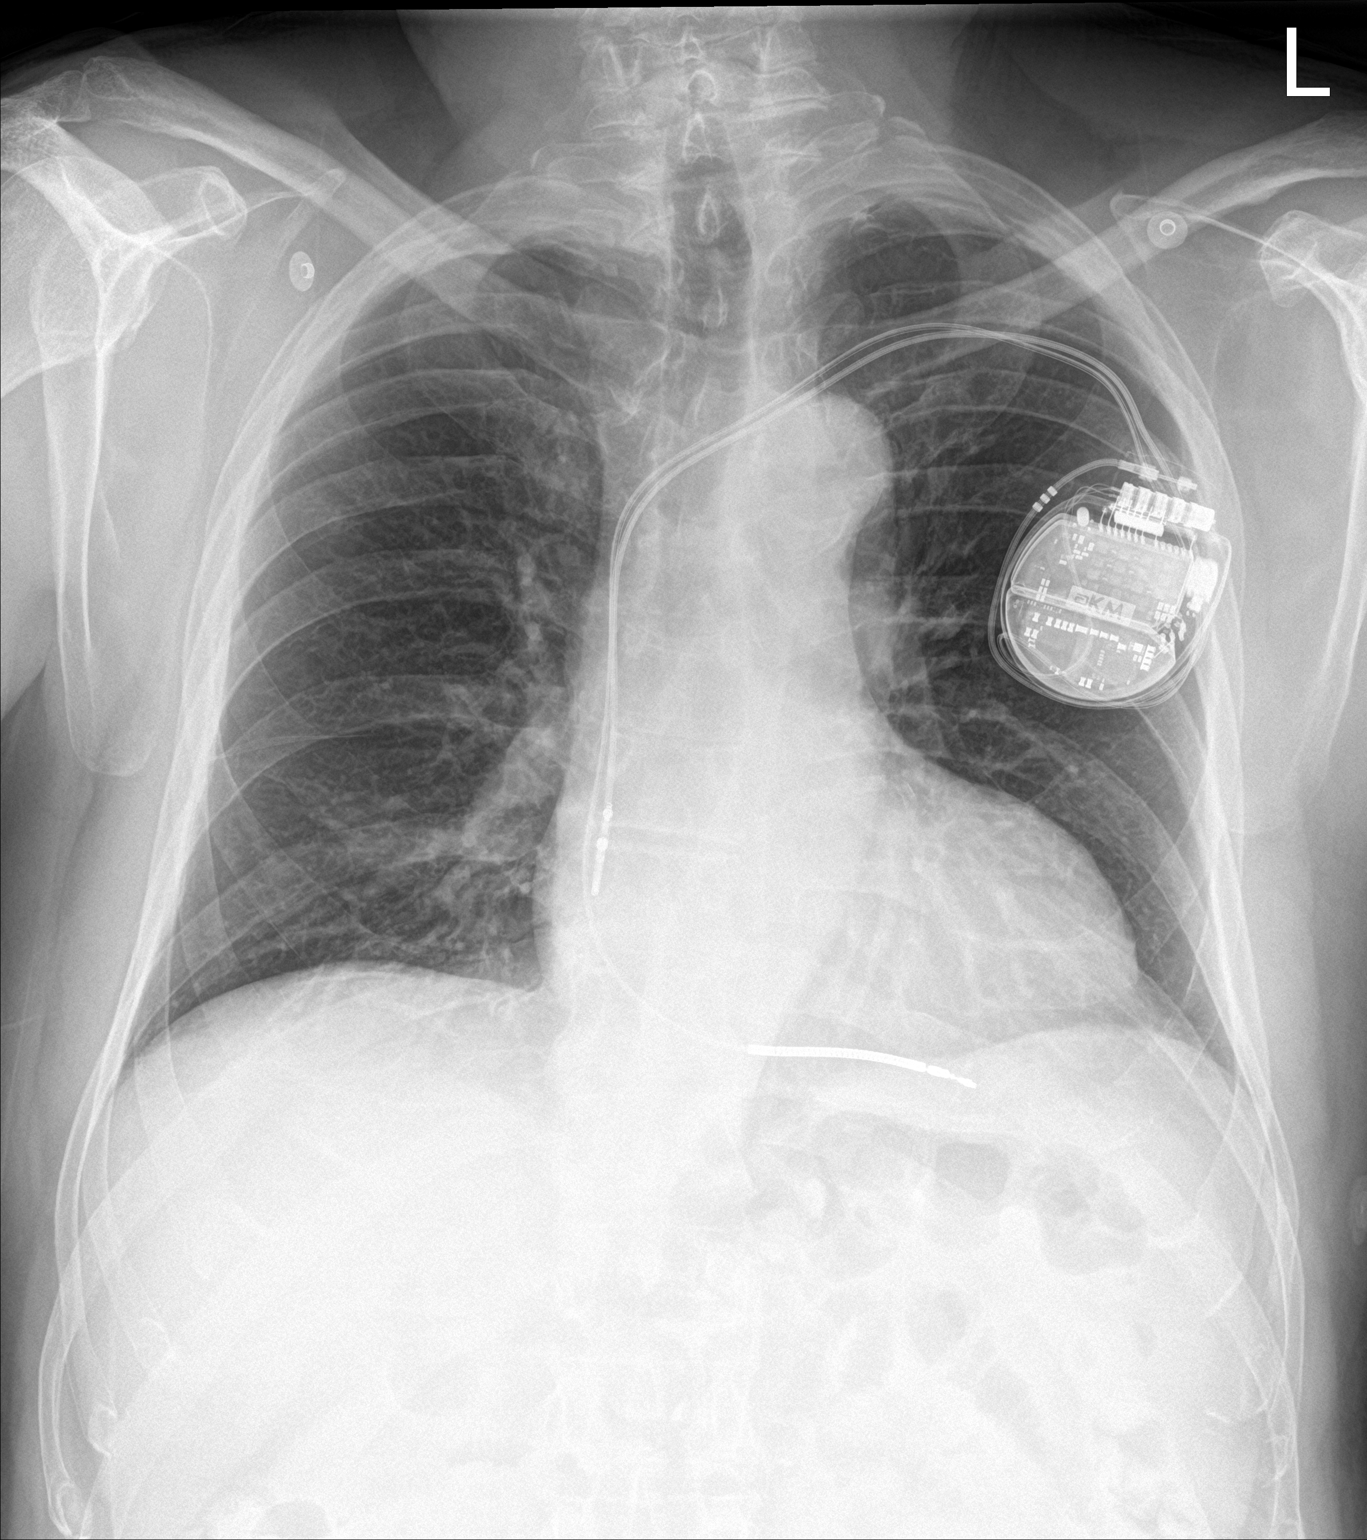

[2 of 2 positions shown; findings below may reference images not displayed]

FINDINGS: Lungs are clear.  No pleural effusion or pneumothorax.

The heart is top-normal in size.  Left subclavian pacemaker.

Visualized osseous structures are within normal limits.
IMPRESSION: No evidence of acute cardiopulmonary disease.

## 2023-05-09 DIAGNOSIS — D485 Neoplasm of uncertain behavior of skin: Secondary | ICD-10-CM | POA: Diagnosis not present

## 2023-05-09 DIAGNOSIS — C44629 Squamous cell carcinoma of skin of left upper limb, including shoulder: Secondary | ICD-10-CM | POA: Diagnosis not present

## 2023-05-09 DIAGNOSIS — Z85820 Personal history of malignant melanoma of skin: Secondary | ICD-10-CM | POA: Diagnosis not present

## 2023-05-09 DIAGNOSIS — L570 Actinic keratosis: Secondary | ICD-10-CM | POA: Diagnosis not present

## 2023-05-09 DIAGNOSIS — L820 Inflamed seborrheic keratosis: Secondary | ICD-10-CM | POA: Diagnosis not present

## 2023-05-09 DIAGNOSIS — Z85828 Personal history of other malignant neoplasm of skin: Secondary | ICD-10-CM | POA: Diagnosis not present

## 2023-05-09 DIAGNOSIS — L821 Other seborrheic keratosis: Secondary | ICD-10-CM | POA: Diagnosis not present

## 2023-06-05 ENCOUNTER — Other Ambulatory Visit: Payer: Self-pay

## 2023-06-05 DIAGNOSIS — E785 Hyperlipidemia, unspecified: Secondary | ICD-10-CM

## 2023-06-05 DIAGNOSIS — I2510 Atherosclerotic heart disease of native coronary artery without angina pectoris: Secondary | ICD-10-CM

## 2023-06-06 DIAGNOSIS — L570 Actinic keratosis: Secondary | ICD-10-CM | POA: Diagnosis not present

## 2023-06-06 DIAGNOSIS — L821 Other seborrheic keratosis: Secondary | ICD-10-CM | POA: Diagnosis not present

## 2023-06-06 DIAGNOSIS — Z85828 Personal history of other malignant neoplasm of skin: Secondary | ICD-10-CM | POA: Diagnosis not present

## 2023-06-11 ENCOUNTER — Ambulatory Visit (INDEPENDENT_AMBULATORY_CARE_PROVIDER_SITE_OTHER): Payer: Medicare HMO

## 2023-06-11 ENCOUNTER — Telehealth: Payer: Self-pay

## 2023-06-11 DIAGNOSIS — I495 Sick sinus syndrome: Secondary | ICD-10-CM | POA: Diagnosis not present

## 2023-06-11 LAB — CUP PACEART REMOTE DEVICE CHECK
Battery Remaining Longevity: 77 mo
Battery Remaining Percentage: 76 %
Battery Voltage: 3.01 V
Brady Statistic AP VP Percent: 1 %
Brady Statistic AP VS Percent: 47 %
Brady Statistic AS VP Percent: 1 %
Brady Statistic AS VS Percent: 52 %
Brady Statistic RA Percent Paced: 45 %
Brady Statistic RV Percent Paced: 1 %
Date Time Interrogation Session: 20240707220154
HighPow Impedance: 75 Ohm
Implantable Lead Connection Status: 753985
Implantable Lead Connection Status: 753985
Implantable Lead Implant Date: 20220407
Implantable Lead Implant Date: 20220407
Implantable Lead Location: 753859
Implantable Lead Location: 753860
Implantable Lead Model: 7122
Implantable Pulse Generator Implant Date: 20220407
Lead Channel Impedance Value: 360 Ohm
Lead Channel Impedance Value: 550 Ohm
Lead Channel Pacing Threshold Amplitude: 0.75 V
Lead Channel Pacing Threshold Amplitude: 0.75 V
Lead Channel Pacing Threshold Pulse Width: 0.5 ms
Lead Channel Pacing Threshold Pulse Width: 0.5 ms
Lead Channel Sensing Intrinsic Amplitude: 1.2 mV
Lead Channel Sensing Intrinsic Amplitude: 11.3 mV
Lead Channel Setting Pacing Amplitude: 2 V
Lead Channel Setting Pacing Amplitude: 2.5 V
Lead Channel Setting Pacing Pulse Width: 0.5 ms
Lead Channel Setting Sensing Sensitivity: 0.5 mV
Pulse Gen Serial Number: 810023633
Zone Setting Status: 755011

## 2023-06-11 NOTE — Telephone Encounter (Signed)
Discussed with A. Tillery PA-C in Dr. Lubertha Basque absence. We will refer patient to the AF clinic.  I contacted him and he is fully in agreement.  He would like for them to wait and call him to make the appt on Wednesay 06/13/23.  Tomorrow is his anniversary and they will be traveling.   Forwarding to AF clinic. Thanks.

## 2023-06-11 NOTE — Telephone Encounter (Signed)
Scheduled remote reviewed. Normal device function.   53 AMS and 2 VT-NS events on 06/05/23 between 4:24 pm and 6:12 pm, longest 11 min 6 sec, EGMs c/w AF with RVR noted, average V rates during AMS primarily 70-110 bpm.  Per Epic patient has a hx of a stroke, no hx of AF and is not on OAC. Routed to clinic for review. Next remote 91 days. MC, CVRS  Patient states that on 7/2 at this time he was laying a stone sidewalk.  He became dizzy, SOB, with near syncopal event. He thought he was dehydrated.  He denies any symptoms since.  Denies that he was using any electrical equipment.    Discussed the importance of staying out of the heat, delaying labor intensive activity until cooler weather and getting help with heavy tasks.  Also, patient to increase fluid intake during these hot days especially. Patient verbalizes understanding.   Forwarding for provider review and recommendations.

## 2023-06-18 NOTE — Telephone Encounter (Signed)
Patient has appt with afib clinic: 06/19/2023.

## 2023-06-19 ENCOUNTER — Encounter (HOSPITAL_COMMUNITY): Payer: Self-pay | Admitting: Internal Medicine

## 2023-06-19 ENCOUNTER — Ambulatory Visit (HOSPITAL_COMMUNITY)
Admission: RE | Admit: 2023-06-19 | Discharge: 2023-06-19 | Disposition: A | Discharge status: Home / Self Care | Payer: Medicare HMO | Source: Ambulatory Visit | Attending: Internal Medicine | Admitting: Internal Medicine

## 2023-06-19 VITALS — BP 140/88 | HR 60 | Ht 68.0 in | Wt 169.2 lb

## 2023-06-19 DIAGNOSIS — Z7901 Long term (current) use of anticoagulants: Secondary | ICD-10-CM | POA: Insufficient documentation

## 2023-06-19 DIAGNOSIS — I480 Paroxysmal atrial fibrillation: Secondary | ICD-10-CM | POA: Insufficient documentation

## 2023-06-19 DIAGNOSIS — E785 Hyperlipidemia, unspecified: Secondary | ICD-10-CM | POA: Diagnosis not present

## 2023-06-19 DIAGNOSIS — K219 Gastro-esophageal reflux disease without esophagitis: Secondary | ICD-10-CM | POA: Insufficient documentation

## 2023-06-19 DIAGNOSIS — I110 Hypertensive heart disease with heart failure: Secondary | ICD-10-CM | POA: Diagnosis not present

## 2023-06-19 DIAGNOSIS — Z8673 Personal history of transient ischemic attack (TIA), and cerebral infarction without residual deficits: Secondary | ICD-10-CM | POA: Insufficient documentation

## 2023-06-19 DIAGNOSIS — I2510 Atherosclerotic heart disease of native coronary artery without angina pectoris: Secondary | ICD-10-CM | POA: Diagnosis not present

## 2023-06-19 DIAGNOSIS — D6869 Other thrombophilia: Secondary | ICD-10-CM | POA: Insufficient documentation

## 2023-06-19 DIAGNOSIS — I509 Heart failure, unspecified: Secondary | ICD-10-CM | POA: Insufficient documentation

## 2023-06-19 MED ORDER — APIXABAN 5 MG PO TABS: 5.0000 mg | ORAL_TABLET | Freq: Two times a day (BID) | ORAL | 3 refills | Status: DC

## 2023-06-19 NOTE — Patient Instructions (Signed)
 Start Eliquis 5mg twice a day 

## 2023-06-19 NOTE — Progress Notes (Signed)
Primary Care Physician: Carlean Jews, NP Primary Cardiologist: Peter Swaziland, MD Electrophysiologist: Lewayne Bunting, MD     Referring Physician: Dr. Denese Killings Brandon Gonzales is a 72 y.o. male with a history of CAD s/p stent, CVA, HTN, HLD, GERD, hypertrophic cardiomyopathy, nonsustained VT, syncope s/p ICD implantation  03/10/2021, and paroxysmal atrial fibrillation who presents for consultation in the [facility]. Recent device check completed on 06/11/23 showed new Afib with RVR. Patient has a CHADS2VASC score of 5.  On evaluation today, he is currently in NSR. He specifically notes that day of episode he was overexerting himself working outside. He was not drinking enough water and actually had heat exhaustion. He takes Plavix daily. He does not drink coffee or alcohol. Wife states he does not snore or stop breathing at night.     Today, he denies symptoms of palpitations, chest pain, shortness of breath, orthopnea, PND, lower extremity edema, dizziness, presyncope, syncope, snoring, daytime somnolence, bleeding, or neurologic sequela. The patient is tolerating medications without difficulties and is otherwise without complaint today.    Atrial Fibrillation Risk Factors:  he does not have symptoms or diagnosis of sleep apnea.  he has a BMI of Body mass index is 25.73 kg/m.[name] Kitchen Filed Weights   06/19/23 0915  Weight: 76.7 kg    Current Outpatient Medications  Medication Sig Dispense Refill   amLODipine (NORVASC) 2.5 MG tablet TAKE 1 TABLET BY MOUTH EVERY DAY 90 tablet 3   apixaban (ELIQUIS) 5 MG TABS tablet Take 1 tablet (5 mg total) by mouth 2 (two) times daily. 60 tablet 3   carvedilol (COREG) 12.5 MG tablet TAKE 0.5 TABLETS (6.25 MG TOTAL) BY MOUTH 2 (TWO) TIMES DAILY. 90 tablet 3   clopidogrel (PLAVIX) 75 MG tablet TAKE 1 TABLET BY MOUTH EVERY DAY 90 tablet 3   esomeprazole (NEXIUM) 20 MG capsule Take 20 mg by mouth every morning.     Evolocumab  (REPATHA SURECLICK) 140 MG/ML SOAJ Inject 140 mg into the skin every 14 (fourteen) days. 2 mL 11   lisinopril (ZESTRIL) 40 MG tablet TAKE 1 TABLET BY MOUTH EVERY DAY 90 tablet 3   nitroGLYCERIN (NITROSTAT) 0.4 MG SL tablet Place 1 tablet (0.4 mg total) under the tongue every 5 (five) minutes as needed for chest pain. 25 tablet 2   valACYclovir (VALTREX) 500 MG tablet Take 1 tablet (500 mg total) by mouth daily. (Patient taking differently: Take 500 mg by mouth as needed.) 90 tablet 0   fluticasone (FLONASE) 50 MCG/ACT nasal spray Place 2 sprays into both nostrils daily. 16 g 6   oseltamivir (TAMIFLU) 30 MG capsule Take 1 capsule (30 mg total) by mouth 2 (two) times daily. (Patient not taking: Reported on 06/19/2023) 10 capsule 0   No current facility-administered medications for this encounter.    Atrial Fibrillation Management history:  Previous antiarrhythmic drugs: None Previous cardioversions: None Previous ablations: None Anticoagulation history: None   ROS- All systems are reviewed and negative except as per the HPI above.  Physical Exam: BP (!) 140/88   Pulse 60   Ht 5\' 8"  (1.727 m)   Wt 76.7 kg   BMI 25.73 kg/m   GEN: Well nourished, well developed in no acute distress NECK: No JVD; No carotid bruits CARDIAC: Regular rate and rhythm, no murmurs, rubs, gallops RESPIRATORY:  Clear to auscultation without rales, wheezing or rhonchi  ABDOMEN: Soft, non-tender, non-distended EXTREMITIES:  No edema; No deformity  EKG today demonstrates  Vent. rate 60 BPM PR interval 222 ms QRS duration 142 ms QT/QTcB 446/446 ms P-R-T axes 70 106 67 Atrial-paced rhythm with prolonged AV conduction Indeterminate axis Right bundle branch block Abnormal ECG When compared with ECG of 19-Aug-2021 02:56, PREVIOUS ECG IS PRESENT  Echo 09/16/20 demonstrated   1. Left ventricular ejection fraction, by estimation, is 65 to 70%. The  left ventricle has normal function. The left ventricle has  no regional  wall motion abnormalities. There is moderate concentric left ventricular  hypertrophy. Left ventricular  diastolic parameters are consistent with Grade I diastolic dysfunction  (impaired relaxation).   2. Right ventricular systolic function is normal. The right ventricular  size is normal.   3. Left atrial size was moderately dilated.   4. The mitral valve is normal in structure. Trivial mitral valve  regurgitation. No evidence of mitral stenosis.   5. The aortic valve is normal in structure. There is mild calcification  of the aortic valve. There is mild thickening of the aortic valve. Aortic  valve regurgitation is mild. No aortic stenosis is present.   6. The inferior vena cava is normal in size with greater than 50%  respiratory variability, suggesting right atrial pressure of 3 mmHg.   ASSESSMENT & PLAN CHA2DS2-VASc Score = 5  The patient's score is based upon: CHF History: 0 HTN History: 1 Diabetes History: 0 Stroke History: 2 Vascular Disease History: 1 Age Score: 1 Gender Score: 0       ASSESSMENT AND PLAN: Paroxysmal Atrial Fibrillation (ICD10:  I48.0) The patient's CHA2DS2-VASc score is 5, indicating a 7.2% annual risk of stroke.    He is in NSR today. Education provided about Afib with visual diagram. Discussion about anticoagulation to prevent stroke. Continue monitoring via device checks. He is overdue to see Dr. Ladona Ridgel for device f/u.   Secondary Hypercoagulable State (ICD10:  D68.69) The patient is at significant risk for stroke/thromboembolism based upon his CHA2DS2-VASc Score of 5.  Start Apixaban (Eliquis).   Confirmed with cardiologist to continue plavix and begin anticoagulation with Eliquis 5 mg BID. If he has bleeding issues, then would discontinue plavix. After discussion, patient is agreeable to begin anticoagulation.   Follow up as scheduled with Dr. Swaziland. Will schedule to see Dr. Ladona Ridgel for device check.    [place] Bells, PA-C   Af[facility] [address] [place], [place] [address] [phone]

## 2023-06-28 NOTE — Progress Notes (Signed)
Remote ICD transmission.   

## 2023-07-18 ENCOUNTER — Other Ambulatory Visit: Payer: Self-pay | Admitting: Cardiology

## 2023-07-19 DIAGNOSIS — Z85828 Personal history of other malignant neoplasm of skin: Secondary | ICD-10-CM | POA: Diagnosis not present

## 2023-07-19 DIAGNOSIS — L570 Actinic keratosis: Secondary | ICD-10-CM | POA: Diagnosis not present

## 2023-07-19 DIAGNOSIS — L738 Other specified follicular disorders: Secondary | ICD-10-CM | POA: Diagnosis not present

## 2023-07-24 ENCOUNTER — Encounter: Payer: Self-pay | Admitting: Internal Medicine

## 2023-07-24 ENCOUNTER — Ambulatory Visit: Payer: Medicare HMO | Attending: Internal Medicine | Admitting: Internal Medicine

## 2023-07-24 DIAGNOSIS — B001 Herpesviral vesicular dermatitis: Secondary | ICD-10-CM | POA: Diagnosis not present

## 2023-07-24 LAB — CUP PACEART INCLINIC DEVICE CHECK
Battery Remaining Longevity: 80 mo
Brady Statistic RA Percent Paced: 45 %
Brady Statistic RV Percent Paced: 0.23 %
Date Time Interrogation Session: 20240820172846
HighPow Impedance: 72 Ohm
Implantable Lead Connection Status: 753985
Implantable Lead Connection Status: 753985
Implantable Lead Implant Date: 20220407
Implantable Lead Implant Date: 20220407
Implantable Lead Location: 753859
Implantable Lead Location: 753860
Implantable Lead Model: 7122
Implantable Pulse Generator Implant Date: 20220407
Lead Channel Impedance Value: 362.5 Ohm
Lead Channel Impedance Value: 537.5 Ohm
Lead Channel Pacing Threshold Amplitude: 0.5 V
Lead Channel Pacing Threshold Amplitude: 0.5 V
Lead Channel Pacing Threshold Amplitude: 0.75 V
Lead Channel Pacing Threshold Amplitude: 0.75 V
Lead Channel Pacing Threshold Pulse Width: 0.5 ms
Lead Channel Pacing Threshold Pulse Width: 0.5 ms
Lead Channel Pacing Threshold Pulse Width: 0.5 ms
Lead Channel Pacing Threshold Pulse Width: 0.5 ms
Lead Channel Sensing Intrinsic Amplitude: 1 mV
Lead Channel Sensing Intrinsic Amplitude: 11.3 mV
Lead Channel Setting Pacing Amplitude: 2 V
Lead Channel Setting Pacing Amplitude: 2.5 V
Lead Channel Setting Pacing Pulse Width: 0.5 ms
Lead Channel Setting Sensing Sensitivity: 0.5 mV
Pulse Gen Serial Number: 810023633
Zone Setting Status: 755011

## 2023-07-24 MED ORDER — ASPIRIN 81 MG PO TBEC: 81.0000 mg | DELAYED_RELEASE_TABLET | Freq: Every day | ORAL | Status: DC

## 2023-07-24 MED ORDER — VALACYCLOVIR HCL 500 MG PO TABS
500.0000 mg | ORAL_TABLET | ORAL | 0 refills | Status: DC | PRN
Start: 2023-07-24 — End: 2024-03-31

## 2023-07-24 NOTE — Progress Notes (Signed)
HPI Brandon Gonzales returns today for followup. He is a pleasant 72 yo man with HCM, s/p DDD ICD insertion. He also has CAD, s/p PCI over 6 years ago.  He sustained an cryptogenic stroke remotely and has subsequently been found to have atrial fib. He is on eliquis.  He denies chest pain or sob.  Allergies  Allergen Reactions   Antihistamines, Chlorpheniramine-Type Other (See Comments)    Altered mental status   Pheniramine Other (See Comments)    Altered mental status   Statins Other (See Comments)    Leg cramps     Zetia [Ezetimibe] Other (See Comments)    Leg cramps     Current Outpatient Medications  Medication Sig Dispense Refill   amLODipine (NORVASC) 2.5 MG tablet TAKE 1 TABLET BY MOUTH EVERY DAY 90 tablet 1   apixaban (ELIQUIS) 5 MG TABS tablet Take 1 tablet (5 mg total) by mouth 2 (two) times daily. 60 tablet 3   carvedilol (COREG) 12.5 MG tablet TAKE 0.5 TABLETS (6.25 MG TOTAL) BY MOUTH 2 (TWO) TIMES DAILY. 90 tablet 3   clopidogrel (PLAVIX) 75 MG tablet TAKE 1 TABLET BY MOUTH EVERY DAY 90 tablet 3   esomeprazole (NEXIUM) 20 MG capsule Take 20 mg by mouth every morning.     Evolocumab (REPATHA SURECLICK) 140 MG/ML SOAJ Inject 140 mg into the skin every 14 (fourteen) days. 2 mL 11   lisinopril (ZESTRIL) 40 MG tablet TAKE 1 TABLET BY MOUTH EVERY DAY 90 tablet 3   nitroGLYCERIN (NITROSTAT) 0.4 MG SL tablet Place 1 tablet (0.4 mg total) under the tongue every 5 (five) minutes as needed for chest pain. 25 tablet 2   valACYclovir (VALTREX) 500 MG tablet Take 1 tablet (500 mg total) by mouth daily. (Patient taking differently: Take 500 mg by mouth as needed.) 90 tablet 0   No current facility-administered medications for this visit.     Past Medical History:  Diagnosis Date   Arthritis    Cancer (HCC)    SKIN CA  SQUAMOUS CELL   Coronary artery disease    a. 2007 Stent to LAD, PTCA D1;  b. DES to LAD & LCx 10/13 in the setting of STEMI.   Dyslipidemia    a. Statin and  zetia-intolerant. On niacin.   GERD (gastroesophageal reflux disease)    Heart murmur    Hyperlipidemia    Phreesia 11/22/2020   Hypertension    Hypertrophic cardiomyopathy (HCC)    a. 09/2012 Echo: EF 55%, mid to apical anterolateral, posterior, apical anterior, apical HK, Gr1 DD, turbulence across LVOT suggesting degree of obstruction, mild MR, mildly dil LA.   Myocardial infarction (HCC)    Phreesia 11/22/2020   Stroke [facility])    Phreesia 11/22/2020    ROS:   All systems reviewed and negative except as noted in the HPI.   Past Surgical History:  Procedure Laterality Date   CARDIAC CATHETERIZATION  2007   stent to LAD and PTCA of diagonal branch   CARDIAC CATHETERIZATION  2010   CORONARY ANGIOPLASTY WITH STENT PLACEMENT  2013   95-99% prox LAD, patent LAD stent distal to this, occluded mid-dist LCx, mild <10% RCA irreg; s/p DES-prox LAD & DES to mid-dist LCx   CORONARY BALLOON ANGIOPLASTY N/A 05/16/2017   Procedure: Coronary Balloon Angioplasty;  Surgeon: Marykay Lex, MD;  Location: [facility] INVASIVE CV LAB;  Service: Cardiovascular;  Laterality: N/A;   ELBOW SURGERY  1996   right   ICD  IMPLANT N/A 03/10/2021   Procedure: ICD IMPLANT;  Surgeon: Marinus Maw, MD;  Location[facility] INVASIVE CV LAB;  Service: Cardiovascular;  Laterality: N/A;   KNEE SURGERY  1993   left   LEFT HEART CATH Bilateral 09/22/2012   Procedure: LEFT HEART CATH;  Surgeon: Peter M Swaziland, MD;  Location: [facility] CATH LAB;  Service: Cardiovascular;  Laterality: Bilateral;   LEFT HEART CATH AND CORONARY ANGIOGRAPHY N/A 05/16/2017   Procedure: Left Heart Cath and Coronary Angiography;  Surgeon: Marykay Lex, MD;  Location: [facility] INVASIVE CV LAB;  Service: Cardiovascular;  Laterality: N/A;   PERCUTANEOUS CORONARY STENT INTERVENTION (PCI-S) Right 09/22/2012   Procedure: PERCUTANEOUS CORONARY STENT INTERVENTION (PCI-S);  Surgeon: Peter M Swaziland, MD;  Location: [facility] CATH LAB;  Service: Cardiovascular;  Laterality: Right;      Family History  Problem Relation Age of Onset   Heart attack Mother 30   Heart disease Mother    AAA (abdominal aortic aneurysm) Father    Heart disease Father      Social History   Socioeconomic History   Marital status: Married    Spouse name: Junie Panning   Number of children: Not on file   Years of education: high school    Highest education level: High school graduate  Occupational History   Occupation: retired IT sales professional   Tobacco Use   Smoking status: Never    Passive exposure: Never   Smokeless tobacco: Never  Vaping Use   Vaping status: Never Used  Substance and Sexual Activity   Alcohol use: No   Drug use: No   Sexual activity: Not Currently  Other Topics Concern   Not on file  Social History Narrative   Retired IT sales professional   Social Determinants of Corporate investment banker Strain: Not on file  Food Insecurity: Not on file  Transportation Needs: Not on file  Physical Activity: Unknown (02/26/2018)   Exercise Vital Sign    Days of Exercise per Week: 4 days    Minutes of Exercise per Session: Not on file  Stress: Not on file  Social Connections: Not on file  Intimate Partner Violence: Not on file     BP 128/80   Pulse 60   Ht 5\' 8"  (1.727 m)   Wt 173 lb 3.2 oz (78.6 kg)   SpO2 96%   BMI 26.33 kg/m   Physical Exam:  Well appearing NAD HEENT: Unremarkable Neck:  No JVD, no thyromegally Lymphatics:  No adenopathy Back:  No CVA tenderness Lungs:  Clear HEART:  Regular rate rhythm, no murmurs, no rubs, no clicks Abd:  soft, positive bowel sounds, no organomegally, no rebound, no guarding Ext:  2 plus pulses, no edema, no cyanosis, no clubbing Skin:  No rashes no nodules Neuro:  CN II through XII intact, motor grossly intact   DEVICE  Normal device function.  See PaceArt for details.   Assess/Plan:  HCM - he is s/p DDD ICD and he has not had any VT.  Cryptogenic stroke - He has been found to have atrial fib and has been placed on  eliquis. He will stop the plavix. ICD - his st[employer] Jude DDD ICD is working normally. We will recheck in several months.  HTN - His bp is well controlled. No change in meds. CAD - he will stop plavix and start ASA 81 mg daily.   Sharlot Gowda Aretta Stetzel,MD

## 2023-07-24 NOTE — Patient Instructions (Addendum)
Medication Instructions:  Your physician has recommended you make the following change in your medication:  STOP clopidogrel (plavix) START 81mg  Aspirin one tablet daily  Lab Work: None ordered.  If you have labs (blood work) drawn today and your tests are completely normal, you will receive your results only by: MyChart Message (if you have MyChart) OR A paper copy in the mail If you have any lab test that is abnormal or we need to change your treatment, we will call you to review the results.  Testing/Procedures: None ordered.  Follow-Up: At [facility], you and your health needs are our priority.  As part of our continuing mission to provide you with exceptional heart care, we have created designated Provider Care Teams.  These Care Teams include your primary Cardiologist (physician) and Advanced Practice Providers (APPs -  Physician Assistants and Nurse Practitioners) who all work together to provide you with the care you need, when you need it.  Your next appointment:   1 year(s)  The format for your next appointment:   In Person  Provider:   Lewayne Bunting, MD{or one of the following Advanced Practice Providers on your designated Care Team:   Francis Dowse, [place] Casimiro Needle "Mardelle Matte" [place], [place] Earnest Rosier, NP  Important Information About Sugar

## 2023-07-26 ENCOUNTER — Other Ambulatory Visit: Payer: Self-pay | Admitting: Internal Medicine

## 2023-07-26 DIAGNOSIS — B001 Herpesviral vesicular dermatitis: Secondary | ICD-10-CM

## 2023-08-09 DIAGNOSIS — E785 Hyperlipidemia, unspecified: Secondary | ICD-10-CM | POA: Diagnosis not present

## 2023-08-09 DIAGNOSIS — I2510 Atherosclerotic heart disease of native coronary artery without angina pectoris: Secondary | ICD-10-CM | POA: Diagnosis not present

## 2023-08-09 DIAGNOSIS — Z9861 Coronary angioplasty status: Secondary | ICD-10-CM | POA: Diagnosis not present

## 2023-08-09 NOTE — Progress Notes (Signed)
Cardiology Office Note   Date:  08/15/2023   ID:  TICE MCMEANS, DOB 08/12/1951, MRN [id number]  PCP:  Carlean Jews, NP  Cardiologist:   Quanah Majka [place], MD   Chief Complaint  Patient presents with   Atrial Fibrillation   Coronary Artery Disease        History of Present Illness: Brandon Gonzales is a 72 y.o. male who presents for follow up CAD and HCM. He has a PMH including CAD prior mid LAD stenting in 2007 with  PTCA to D1, followed by lateral STEMI and PCI/DES to the midLCX & proximalLAD in 2013. S/p cutting balloon PTCA of the LAD in June 2019 for in stent restenosis.  He also has a h/o HTN, HL, GERD, and hypertrophic cardiomyopathy.     He called on 02/19/17 with complaints of dyspnea and dizziness that he states were similar to when he had his MI. He felt very flushed in his face. He was referred to the ED.  BP recorded by EMS was 210/107. Came down to 159/87 in ED.  Ecg showed NSR with a RBBB- new in January 2018. No acute change. Delta troponin was normal. UA, BMET, BNP were all normal. He was discharged on amlodipine 5 mg daily.    He was evaluated with Echo in April 2018 showing moderate LVH with severe basal septal hypertrophy. No significant outflow gradient. Mild MR. Myoview study showed a small area of scar at the base of the lateral wall. No ischemia. EF 51%. He presented in June 2018 with symptoms of worsening chest pain and dyspnea on exertion. He underwent cardiac cath showing 95% in stent restenosis of the proximal LAD treated with cutting  balloon angioplasty. There was a 65% mid RCA stenosis and 50% in the mid LCx. He was enrolled in the CLEAR trial for hyperlipidemia given history of statin and Zetia intolerance.   The patient was seen in the office 12/17/2019 after an episode of near syncope a couple on New Year's Day.  They had just eaten and he was in his car with his daughter (who is a paramedic) driving a plate of food over to his brother's house.  While  driving he became weak and near syncopal.  He denied any diaphoresis, shortness of breath, or nausea and vomiting.  His daughter took his pulse and thought he might be in atrial fibrillation but if it was present it did not last long.  His symptoms resolved quickly.  He did have a recurrent episode the following 2 days but none since.    A 7 day ZIO monitor was done and showed episodic bradycardia down to the 40"s- usually early am.  He also had frequent PVCs and short runs of PSVT.  In addition he had runs of NSWCT that appear to be NSVT. The longest was 14 beats.  He has had no further episodes of near syncope or symptomatic tachycardia.  Cardiac MRI was ordered with results noted below. Given syncopal episode, wide complex tachycardia noted on monitor, HCM with late gadolinium uptake on MRI and relatively low EF 52% I recommended EP evaluation to see if ICD indicated. He initially deferred. Seen by Dr Ladona Ridgel on 03/23/20. He did eventually undergo ICD implant in April 2022.   He was admitted in October 2021 with a stroke. Had right arm and 4th and 5th finger numbness and weakness. Multiple left MCA infarcts embolic secondary to unknown source.CTA head & neck advanced atherosclerosis B ICA bifurcation and  bulbs. B ICA 30% stenosis w/ pronounced irregularity. Proximal R Subclavian 50%. R VA origin 30%. L VA origin 50%. Mid to distal BA. No LVO. MRI  showed Punctate L primary motor cortec and L subcortical parietal white matter infarcts. MR CS multilevel CX spondylosis w/ moderate and severe foraminal stenosis / narrowing C4-11. Echo showed no cardioembolic source. He was seen by EP for consideration of loop recorder but after discussion he did agree to have ICD implant. He was subsequently started on Praluent for his hyperlipidemia.   He was seen in the ED on 08/23/21 with chest pain. States he had severe chest pain radiating into his neck bilateral and it hurt to take a deep breath. This lasted a couple of  hours at least. Did not change with sl Ntg x 1. States his pain eventually wore off. BP was elevated at 200/104.  Patient's initial evaluation was started in the ED but patient did not stay he left the ED AMA. Overall labs were stable.  Troponin level was slightly elevated at 22 and EKG unchanged from prior. He states pain is different than when he had blockage before. Suspect this was more reflux.   On device check in July he was noted to have paroxysmal Afib. States he had heat exhaustion at that time. He was started on Eliquis and ASA was discontinued. He has done well since then. No significant palpitations, dizziness, chest pain or dyspnea. He does note some mild bruising.    Past Medical History:  Diagnosis Date   Arthritis    Cancer (HCC)    SKIN CA  SQUAMOUS CELL   Coronary artery disease    a. 2007 Stent to LAD, PTCA D1;  b. DES to LAD & LCx 10/13 in the setting of STEMI.   Dyslipidemia    a. Statin and zetia-intolerant. On niacin.   GERD (gastroesophageal reflux disease)    Heart murmur    Hyperlipidemia    Phreesia 11/22/2020   Hypertension    Hypertrophic cardiomyopathy (HCC)    a. 09/2012 Echo: EF 55%, mid to apical anterolateral, posterior, apical anterior, apical HK, Gr1 DD, turbulence across LVOT suggesting degree of obstruction, mild MR, mildly dil LA.   Myocardial infarction (HCC)    Phreesia 11/22/2020   Stroke [facility])    Phreesia 11/22/2020    Past Surgical History:  Procedure Laterality Date   CARDIAC CATHETERIZATION  2007   stent to LAD and PTCA of diagonal branch   CARDIAC CATHETERIZATION  2010   CORONARY ANGIOPLASTY WITH STENT PLACEMENT  2013   95-99% prox LAD, patent LAD stent distal to this, occluded mid-dist LCx, mild <10% RCA irreg; s/p DES-prox LAD & DES to mid-dist LCx   CORONARY BALLOON ANGIOPLASTY N/A 05/16/2017   Procedure: Coronary Balloon Angioplasty;  Surgeon: Marykay Lex, MD;  Location: [facility] INVASIVE CV LAB;  Service: Cardiovascular;   Laterality: N/A;   ELBOW SURGERY  1996   right   ICD IMPLANT N/A 03/10/2021   Procedure: ICD IMPLANT;  Surgeon: Marinus Maw, MD;  Location: [facility] INVASIVE CV LAB;  Service: Cardiovascular;  Laterality: N/A;   KNEE SURGERY  1993   left   LEFT HEART CATH Bilateral 09/22/2012   Procedure: LEFT HEART CATH;  Surgeon: Damonie Ellenwood M [place], MD;  Location[facility] CATH LAB;  Service: Cardiovascular;  Laterality: Bilateral;   LEFT HEART CATH AND CORONARY ANGIOGRAPHY N/A 05/16/2017   Procedure: Left Heart Cath and Coronary Angiography;  Surgeon: Marykay Lex, MD;  Location: [facility]  INVASIVE CV LAB;  Service: Cardiovascular;  Laterality: N/A;   PERCUTANEOUS CORONARY STENT INTERVENTION (PCI-S) Right 09/22/2012   Procedure: PERCUTANEOUS CORONARY STENT INTERVENTION (PCI-S);  Surgeon: Jeven Topper M [place], MD;  Location: [facility] CATH LAB;  Service: Cardiovascular;  Laterality: Right;     Current Outpatient Medications  Medication Sig Dispense Refill   amLODipine (NORVASC) 2.5 MG tablet TAKE 1 TABLET BY MOUTH EVERY DAY 90 tablet 1   carvedilol (COREG) 12.5 MG tablet TAKE 0.5 TABLETS (6.25 MG TOTAL) BY MOUTH 2 (TWO) TIMES DAILY. 90 tablet 3   clopidogrel (PLAVIX) 75 MG tablet Take 75 mg by mouth daily.     esomeprazole (NEXIUM) 20 MG capsule Take 20 mg by mouth every morning.     Evolocumab (REPATHA SURECLICK) 140 MG/ML SOAJ Inject 140 mg into the skin every 14 (fourteen) days. 2 mL 11   lisinopril (ZESTRIL) 40 MG tablet TAKE 1 TABLET BY MOUTH EVERY DAY 90 tablet 3   nitroGLYCERIN (NITROSTAT) 0.4 MG SL tablet Place 1 tablet (0.4 mg total) under the tongue every 5 (five) minutes as needed for chest pain. 25 tablet 2   valACYclovir (VALTREX) 500 MG tablet Take 1 tablet (500 mg total) by mouth as needed. 60 tablet 0   apixaban (ELIQUIS) 5 MG TABS tablet Take 1 tablet (5 mg total) by mouth 2 (two) times daily. 60 tablet 3   No current facility-administered medications for this visit.    Allergies:   Antihistamines,  chlorpheniramine-type; Pheniramine; Statins; and Zetia [ezetimibe]    Social History:  The patient  reports that he has never smoked. He has never been exposed to tobacco smoke. He has never used smokeless tobacco. He reports that he does not drink alcohol and does not use drugs.   Family History:  The patient's family history includes AAA (abdominal aortic aneurysm) in his father; Heart attack (age of onset: 28) in his mother; Heart disease in his father and mother.    ROS:  Please see the history of present illness.   Otherwise, review of systems are positive for none.   All other systems are reviewed and negative.    PHYSICAL EXAM: VS:  BP 128/82   Pulse 62   Ht 5\' 8"  (1.727 m)   Wt 172 lb 6.4 oz (78.2 kg)   SpO2 93%   BMI 26.21 kg/m  , BMI Body mass index is 26.21 kg/m. GEN: Well nourished, well developed, in no acute distress  HEENT: normal  Neck: no JVD, carotid bruits, or masses Cardiac: RRR;  rubs, or gallops,no edema. Harsh gr 2/6 systolic murmur LSB and apex. Respiratory:  clear to auscultation bilaterally, normal work of breathing GI: soft, nontender, nondistended, + BS MS: no deformity or atrophy  Skin: warm and dry, no rash Neuro:  Strength and sensation are intact Psych: euthymic mood, full affect   EKG:  EKG is not ordered today.     Recent Labs: 10/24/2022: Hemoglobin 16.0; Platelets 169; TSH 3.740 08/09/2023: ALT 16; BUN 12; Creatinine, Ser 1.37; Potassium 5.0; Sodium 143    Lipid Panel    Component Value Date/Time   CHOL 96 (L) 08/09/2023 1105   TRIG 122 08/09/2023 1105   HDL 37 (L) 08/09/2023 1105   CHOLHDL 2.6 08/09/2023 1105   CHOLHDL 7.7 09/16/2020 0428   VLDL 48 (H) 09/16/2020 0428   LDLCALC 37 08/09/2023 1105   LDLDIRECT 159.5 10/01/2012 0847      Wt Readings from Last 3 Encounters:  08/15/23 172 lb 6.4 oz (78.2  kg)  07/24/23 173 lb 3.2 oz (78.6 kg)  06/19/23 169 lb 3.2 oz (76.7 kg)      Other studies Reviewed: Additional studies/  records that were reviewed today include:   Cardiac cath 05/16/17:  Coronary Balloon Angioplasty  Left Heart Cath and Coronary Angiography  Conclusion    Ost RCA lesion, 40 %stenosed. Mid RCA lesion, 65 %stenosed. Borderline significant. The left ventricular systolic function is normal. LV end diastolic pressure is mildly elevated. There is no mitral valve regurgitation. There is no aortic valve stenosis. Prox LAD to Mid LAD bare-metal stent, 20 %stenosed. Mid LAD lesion, 40 %stenosed. Localized segment of previous bare-metal stent- 2nd Mrg DES stent, 0 %stenosed. Prox Cx to Mid Cx lesion, 40 %stenosed. Ost LAD lesion, 95 %stenosed. Post intervention, there is a 10% residual stenosis.   Mr. Zelinski has several potential culprit lesions, however the most obvious one in the LAD with 95% stenosis. This is in-stent restenosis, therefore treated with balloon angioplasty using the [facility] Cutting Balloon followed by post-dilation with a noncompliant balloon.   The RCA lesion is of borderline significance, but does not appear to be acute in nature. Would defer intervention on this vessel at this time unless she has ongoing or worsening symptoms.     Plan: Overnight monitoring and likely discharge tomorrow Return to nursing unit for ongoing care and TR been removal. Back on dual antiplatelet therapy.- Reduced aspirin 81 mg him and using Brilinta. Further risk factor modification per rounding team.     He can follow-up with Dr. [place], will need to have a follow-up appointment rescheduled as he was post be there tomorrow June 14.       Bryan Lemma, M.D., M.S. Interventional Cardiologist      Echo 07/14/19: IMPRESSIONS     1. The left ventricle has normal systolic function with an ejection  fraction of 60-65%. The cavity size was normal. Severe basal septal  hypertrophy and mild concentric hypertrophy. Left ventricular diastolic  Doppler parameters are consistent with   pseudonormalization. Elevated left ventricular end-diastolic pressure.   2. The average left ventricular global longitudinal strain is -16.0 %.   3. The right ventricle has normal systolic function. The cavity was  normal. There is no increase in right ventricular wall thickness.   4. Left atrial size was mildly dilated.   5. The aortic valve is tricuspid. Moderate sclerosis of the aortic valve.  Aortic valve regurgitation is mild by color flow Doppler.   6. The aorta is normal in size and structure.   Cardiac MRI 02/25/20: CLINICAL DATA:  Hypertrophic cardiomyopathy suspected, further testing   COMPARISON: Echocardiogram 07/14/2019   EXAM: CARDIAC MRI   TECHNIQUE: The patient was scanned on a 1.5 Tesla GE magnet. A dedicated cardiac coil was used. Functional imaging was done using Fiesta sequences. 2,3, and 4 chamber views were done to assess for RWMA's. Modified Simpson's rule using a short axis stack was used to calculate an ejection fraction on a dedicated work [employer]. The patient received 10mL GADAVIST GADOBUTROL 1 MMOL/ML IV SOLN. After 10 minutes inversion recovery sequences were used to assess for infiltration and scar tissue.   CONTRAST:  10mL GADAVIST GADOBUTROL 1 MMOL/ML IV SOLN   FINDINGS: LEFT VENTRICLE:   Normal LV chamber size.   Wall thickness is increased in the basal septum as described below. Wall thinning in the anterior wall and inferolateral wall. The lateral wall demonstrates scar and is significantly thinned.   Akinesis of  the inferolateral wall from base to mid ventricle, and hypokinesis of lateral apex. Hypokinesis of the anterior wall from base to apex.   LVEF = 52%   Maximal wall thickness: 18 mm   Location: Basal anteroseptum   There is systolic anterior motion of the mitral valve. Flow dephasing suggests left ventricular outflow tract obstruction. Mitral regurgitation is seen, posteriorly directed, eccentric  and qualitatively mild-moderate.   No evidence of left ventricular apical aneurysm.   Findings are consistent with hypertrophic cardiomyopathy with obstruction. Morphologic subtype: Sigmoid.   There is late gadolinium enhancement in the left ventricular myocardium.   LGE noted subendocardium of the inferolateral and the inferior portion of the anterolateral wall from base to mid ventricle, including a mildly aneurysmal appearing inferolateral mid ventricular segment. Delayed myocardial enhancement is <50% of the thickness of the lateral wall, suggesting viability in this coronary distribution.   There is also LGE in the subendocardium of the anterior wall from base to mid ventricle. Delayed myocardial enhancement is <50% of the thickness of the anterior wall, suggesting viability in this coronary distribution.   Mild delayed myocardial enhancement noted at the RV insertion points. This represents <1% of the myocardial mass. Quantitation of LGE not performed in the setting of prominent delayed gadolinium enhancement from ischemic heart disease.   Normal T1 myocardial nulling kinetics suggests against a diagnosis of cardiac amyloidosis.   ECV = 22%, normal.   RIGHT VENTRICLE:   Normal right ventricular size, thickness and systolic function (RVEF =60%). There are no regional wall motion abnormalities.   ATRIA:   Mild left atrial enlargement.  Normal right atrial chamber size.   VALVES:   Mild-moderate mitral valve regurgitation. Due to systolic anterior motion by flow dephasing.   Trivial aortic valve regurgitation by flow dephasing.   PERICARDIUM:   Normal pericardium.  Trace circumferential pericardial effusion.   OTHER: No significant extracardiac findings noted.   MEASUREMENTS:   LVEDV: 174 mL   LVESV: 83 mL   SV: 91 mL   CO: 5.5 L/min   Myocardial mass: 160 g   RVEDV: 121 mL   RVEDS: 49 mL   RVSV: 72 mL   IMPRESSION: 1.  Normal left  ventricular size, LVEF 52%.   2. Akinesis of the basal-mid inferolateral wall with scar and wall thinning. Hypokinesis of the basal-apical anterior wall. Subendocardial delayed enhancement that is <50% thickness of the myocardium, suggestive of ischemic heart disease with viability in these regions.   3. Hypertrophic cardiomyopathy, sigmoid subtype. 18 mm basal anteroseptum.   4. Left ventricular outflow tract obstruction with systolic anterior motion of the mitral valve and qualitatively mild-moderate mitral valve regurgitation.   5. Mild myocardial enhancement in the RV insertion points at the base. No significant delayed myocardial enhancement associated with area of myocardial thickening.   6. No evidence of cardiac amyloidosis. Normal extracellular volume, 22%.   7.  Normal right ventricular size and function, RVEF 60%   8.  Trace pericardial effusion.     Electronically Signed   By: Weston Brass   On: 02/29/2020 18:43   Echo 09/16/20: IMPRESSIONS     1. Left ventricular ejection fraction, by estimation, is 65 to 70%. The  left ventricle has normal function. The left ventricle has no regional  wall motion abnormalities. There is moderate concentric left ventricular  hypertrophy. Left ventricular  diastolic parameters are consistent with Grade I diastolic dysfunction  (impaired relaxation).   2. Right ventricular systolic function is normal. The right  ventricular  size is normal.   3. Left atrial size was moderately dilated.   4. The mitral valve is normal in structure. Trivial mitral valve  regurgitation. No evidence of mitral stenosis.   5. The aortic valve is normal in structure. There is mild calcification  of the aortic valve. There is mild thickening of the aortic valve. Aortic  valve regurgitation is mild. No aortic stenosis is present.   6. The inferior vena cava is normal in size with greater than 50%  respiratory variability, suggesting right atrial  pressure of 3 mmHg.   Conclusion(s)/Recommendation(s): No intracardiac source of embolism  detected on this transthoracic study. A transesophageal echocardiogram is  recommended to exclude cardiac source of embolism if clinically indicated.   ASSESSMENT AND PLAN:    1.Hypertrophic CM:  euvolemic on exam.  Most recent Echo in Oct 2021 showed no significant LVOT gradient.  Moderate LVH and normal EF. Late gadolinium enhancement. He has NSVT noted on monitor. Now s/p ICD implant. Clinically doing well.    2.  CAD: s/p prior LAD, D1, and LCX PCI with STEMI in 2013. Admitted in June 2018 with in stent restenosis of the proximal LAD. Symptoms predominantly of DOE. Treated with cutting balloon PCI. Interestedly his Myoview study prior was normal so may not be a reliable study.  Currently without angina. Continue medical therapy. Would continue Plavix but not ASA since now on Eliquis.    3.  HL:   intolerant to statins and zetia. On CLEAR trial now. LDL was 155. Now on Praluent. Excellent response with LDL 37    4.  Hypertension:  BP is well controlled.    5.  S/p CVA October 2021. Likely related to embolus nonobstructive but extensive carotid arterial disease.   6.  Paroxysmal Afib noted on device check. [place] Vasc score of 5. Now on Eliquis.    .      Current medicines are reviewed at length with the patient today.  The patient does not have concerns regarding medicines.  The following changes have been made:  no change  Labs/ tests ordered today include:  No orders of the defined types were placed in this encounter.     Disposition:   FU with me in 6 months.  Signed, Tatelyn Vanhecke [place], MD  08/15/2023 9:50 AM    [facility] [address] [place], [place], [address] Phone [phone], Fax [phone]

## 2023-08-15 ENCOUNTER — Ambulatory Visit: Payer: Medicare HMO | Attending: Cardiology | Admitting: Cardiology

## 2023-08-15 ENCOUNTER — Encounter: Payer: Self-pay | Admitting: Cardiology

## 2023-08-15 VITALS — BP 128/82 | HR 62 | Ht 68.0 in | Wt 172.4 lb

## 2023-08-15 DIAGNOSIS — Z8673 Personal history of transient ischemic attack (TIA), and cerebral infarction without residual deficits: Secondary | ICD-10-CM | POA: Diagnosis not present

## 2023-08-15 DIAGNOSIS — I422 Other hypertrophic cardiomyopathy: Secondary | ICD-10-CM | POA: Diagnosis not present

## 2023-08-15 DIAGNOSIS — Z9861 Coronary angioplasty status: Secondary | ICD-10-CM | POA: Diagnosis not present

## 2023-08-15 DIAGNOSIS — I2510 Atherosclerotic heart disease of native coronary artery without angina pectoris: Secondary | ICD-10-CM | POA: Diagnosis not present

## 2023-08-15 DIAGNOSIS — I480 Paroxysmal atrial fibrillation: Secondary | ICD-10-CM | POA: Diagnosis not present

## 2023-08-15 DIAGNOSIS — I495 Sick sinus syndrome: Secondary | ICD-10-CM

## 2023-08-15 MED ORDER — APIXABAN 5 MG PO TABS: 5.0000 mg | ORAL_TABLET | Freq: Two times a day (BID) | ORAL | 3 refills | Status: DC

## 2023-08-15 MED ORDER — NITROGLYCERIN 0.4 MG SL SUBL: 0.4000 mg | SUBLINGUAL_TABLET | SUBLINGUAL | 2 refills | Status: DC | PRN

## 2023-08-15 NOTE — Patient Instructions (Signed)
Medication Instructions:  Continue same medications *If you need a refill on your cardiac medications before your next appointment, please call your pharmacy*   Lab Work: None ordered   Testing/Procedures: None ordered   Follow-Up: At [facility], you and your health needs are our priority.  As part of our continuing mission to provide you with exceptional heart care, we have created designated Provider Care Teams.  These Care Teams include your primary Cardiologist (physician) and Advanced Practice Providers (APPs -  Physician Assistants and Nurse Practitioners) who all work together to provide you with the care you need, when you need it.  We recommend signing up for the patient portal called "MyChart".  Sign up information is provided on this After Visit Summary.  MyChart is used to connect with patients for Virtual Visits (Telemedicine).  Patients are able to view lab/test results, encounter notes, upcoming appointments, etc.  Non-urgent messages can be sent to your provider as well.   To learn more about what you can do with MyChart, go to [email or web address].    Your next appointment:  6 months    Call in [name] to schedule March appointment     Provider:  Dr.Jordan

## 2023-08-20 DIAGNOSIS — I10 Essential (primary) hypertension: Secondary | ICD-10-CM | POA: Diagnosis not present

## 2023-08-20 DIAGNOSIS — H25013 Cortical age-related cataract, bilateral: Secondary | ICD-10-CM | POA: Diagnosis not present

## 2023-08-20 DIAGNOSIS — H2511 Age-related nuclear cataract, right eye: Secondary | ICD-10-CM | POA: Diagnosis not present

## 2023-08-20 DIAGNOSIS — H2513 Age-related nuclear cataract, bilateral: Secondary | ICD-10-CM | POA: Diagnosis not present

## 2023-08-20 DIAGNOSIS — H25043 Posterior subcapsular polar age-related cataract, bilateral: Secondary | ICD-10-CM | POA: Diagnosis not present

## 2023-08-24 ENCOUNTER — Telehealth: Payer: Self-pay

## 2023-08-24 NOTE — Telephone Encounter (Signed)
..  Pre-operative Risk Assessment    Patient Name: Brandon Gonzales  DOB: Aug 23, 1951 MRN: [id number]      Request for Surgical Clearance    Procedure:   CATARACT EXTRACTION WITH INTRAOCUYLAR IMPLANTATION OF BOTH EYE  Date of Surgery:  Clearance 09/11/23                                 Surgeon:  DR Mia Creek OR DR Fredderick Phenix Surgeon's Group or Practice Name:  [facility] Phone number:  [phone] Fax number:  [phone]   Type of Clearance Requested:   - Medical  - Pharmacy:  Hold Apixaban (Eliquis)     Type of Anesthesia:  Not Indicated   Additional requests/questions:   LAST O/V 08/15/23, NEXT VIDEO VISIT 10/05/23  Signed, Renee Ramus   08/24/2023, 2:28 PM

## 2023-08-24 NOTE — Telephone Encounter (Signed)
Patient Name: Brandon Gonzales  DOB: [birth year] MRN: [id number]  Primary Cardiologist: Peter [place], MD  Chart reviewed as part of pre-operative protocol coverage. Cataract extractions are recognized in guidelines as low risk surgeries that do not typically require specific preoperative testing or holding of blood thinner therapy. Therefore, given past medical history and time since last visit, based on ACC/AHA guidelines, Brandon Gonzales would be at acceptable risk for the planned procedure without further cardiovascular testing.   I will route this recommendation to the requesting party via Epic fax function and remove from pre-op pool.  Please call with questions.  Brandon Levering, NP 08/24/2023, 2:45 PM

## 2023-08-28 ENCOUNTER — Telehealth: Payer: Self-pay | Admitting: Cardiology

## 2023-08-28 NOTE — Telephone Encounter (Signed)
Office calling to state they never received the medical clearance fax. They would like it to be faxed over to [phone] please. Please advise

## 2023-08-28 NOTE — Telephone Encounter (Signed)
I re-faxed cardiac clearance updates to the provider fax number given by the office.

## 2023-09-10 ENCOUNTER — Ambulatory Visit (INDEPENDENT_AMBULATORY_CARE_PROVIDER_SITE_OTHER): Payer: Medicare HMO

## 2023-09-10 DIAGNOSIS — I495 Sick sinus syndrome: Secondary | ICD-10-CM

## 2023-09-10 DIAGNOSIS — I4729 Other ventricular tachycardia: Secondary | ICD-10-CM | POA: Diagnosis not present

## 2023-09-11 DIAGNOSIS — H2512 Age-related nuclear cataract, left eye: Secondary | ICD-10-CM | POA: Diagnosis not present

## 2023-09-11 DIAGNOSIS — H25012 Cortical age-related cataract, left eye: Secondary | ICD-10-CM | POA: Diagnosis not present

## 2023-09-11 DIAGNOSIS — H269 Unspecified cataract: Secondary | ICD-10-CM | POA: Diagnosis not present

## 2023-09-11 DIAGNOSIS — H25042 Posterior subcapsular polar age-related cataract, left eye: Secondary | ICD-10-CM | POA: Diagnosis not present

## 2023-09-11 DIAGNOSIS — H2511 Age-related nuclear cataract, right eye: Secondary | ICD-10-CM | POA: Diagnosis not present

## 2023-09-11 LAB — CUP PACEART REMOTE DEVICE CHECK
Battery Remaining Longevity: 74 mo
Battery Remaining Percentage: 73 %
Battery Voltage: 3.01 V
Brady Statistic AP VP Percent: 1 %
Brady Statistic AP VS Percent: 55 %
Brady Statistic AS VP Percent: 1 %
Brady Statistic AS VS Percent: 42 %
Brady Statistic RA Percent Paced: 51 %
Brady Statistic RV Percent Paced: 1 %
Date Time Interrogation Session: 20241006220416
HighPow Impedance: 72 Ohm
Implantable Lead Connection Status: 753985
Implantable Lead Connection Status: 753985
Implantable Lead Implant Date: 20220407
Implantable Lead Implant Date: 20220407
Implantable Lead Location: 753859
Implantable Lead Location: 753860
Implantable Lead Model: 7122
Implantable Pulse Generator Implant Date: 20220407
Lead Channel Impedance Value: 330 Ohm
Lead Channel Impedance Value: 530 Ohm
Lead Channel Pacing Threshold Amplitude: 0.5 V
Lead Channel Pacing Threshold Amplitude: 0.75 V
Lead Channel Pacing Threshold Pulse Width: 0.5 ms
Lead Channel Pacing Threshold Pulse Width: 0.5 ms
Lead Channel Sensing Intrinsic Amplitude: 0.8 mV
Lead Channel Sensing Intrinsic Amplitude: 11.7 mV
Lead Channel Setting Pacing Amplitude: 2 V
Lead Channel Setting Pacing Amplitude: 2.5 V
Lead Channel Setting Pacing Pulse Width: 0.5 ms
Lead Channel Setting Sensing Sensitivity: 0.5 mV
Pulse Gen Serial Number: 810023633
Zone Setting Status: 755011

## 2023-09-15 ENCOUNTER — Ambulatory Visit
Admission: EM | Admit: 2023-09-15 | Discharge: 2023-09-15 | Disposition: A | Discharge status: Home / Self Care | Payer: Medicare HMO

## 2023-09-15 DIAGNOSIS — J019 Acute sinusitis, unspecified: Secondary | ICD-10-CM

## 2023-09-15 MED ORDER — AMOXICILLIN-POT CLAVULANATE 875-125 MG PO TABS: 1.0000 | ORAL_TABLET | Freq: Two times a day (BID) | ORAL | 0 refills | Status: DC

## 2023-09-15 NOTE — ED Provider Notes (Signed)
[facility]    CSN: [id number] Arrival date & time: 09/15/23  0945      History   Chief Complaint Chief Complaint  Patient presents with   Sinus Problem    HPI Brandon Gonzales is a 72 y.o. male.   Patient here today for evaluation of sore throat that started a few days ago that was followed by sinus congestion and pressure.  He reports he has had some cough as well.  He states he has been getting sinus infections for 50 years and states this is what is going on at this time.  He reports he knows that he needs amoxicillin.  He has not had any fever.  He denies any headache.  The history is provided by the patient.  Sinus Problem Pertinent negatives include no abdominal pain, no headaches and no shortness of breath.    Past Medical History:  Diagnosis Date   Arthritis    Cancer (HCC)    SKIN CA  SQUAMOUS CELL   Coronary artery disease    a. 2007 Stent to LAD, PTCA D1;  b. DES to LAD & LCx 10/13 in the setting of STEMI.   Dyslipidemia    a. Statin and zetia-intolerant. On niacin.   GERD (gastroesophageal reflux disease)    Heart murmur    Hyperlipidemia    Phreesia 11/22/2020   Hypertension    Hypertrophic cardiomyopathy (HCC)    a. 09/2012 Echo: EF 55%, mid to apical anterolateral, posterior, apical anterior, apical HK, Gr1 DD, turbulence across LVOT suggesting degree of obstruction, mild MR, mildly dil LA.   Myocardial infarction (HCC)    Phreesia 11/22/2020   Stroke [facility])    Phreesia 11/22/2020    Patient Active Problem List   Diagnosis Date Noted   Hypercoagulable state due to paroxysmal atrial fibrillation (HCC) 06/19/2023   Paroxysmal atrial fibrillation (HCC) 06/19/2023   Right foot pain 11/05/2022   Other fatigue 11/05/2022   Mixed hyperlipidemia 11/05/2022   Acquired deformity of nail of finger 04/03/2022   Pain in finger of right hand 04/03/2022   Sprain of lateral ligament of ankle joint 11/16/2021   Sprain of right ankle 11/16/2021    Low back pain 09/15/2021   Pain in both feet 09/15/2021   Bilateral ankle joint pain 08/03/2021   ICD (implantable cardioverter-defibrillator) in place 06/21/2021   Pain in right foot 04/25/2021   Peroneal tendinitis 04/08/2021   Acute ischemic stroke (HCC) 09/15/2020   Near syncope 12/17/2019   Statin myopathy 11/13/2019   Bilateral plantar fasciitis 07/25/2019   Herpes labialis 01/31/2018   Essential hypertension    Heart murmur    GERD (gastroesophageal reflux disease)    CAD S/P percutaneous coronary angioplasty    Cancer (HCC)    Angina at rest [facility]) 05/17/2017   Hypertrophic cardiomyopathy (HCC)    S/P PTCA (percutaneous transluminal coronary angioplasty)    Unstable angina (HCC)    Positional vertigo 04/24/2016   NSVT (nonsustained ventricular tachycardia) (HCC) 09/25/2012   Hypertrophic obstructive cardiomyopathy (HCC) 09/25/2012   CKD (chronic kidney disease) stage 3, GFR 30-59 ml/min (HCC) 09/25/2012   ST elevation myocardial infarction (STEMI) of lateral wall (HCC) 09/22/2012   CAD (coronary artery disease) 01/11/2012   Dyslipidemia    H/O gastroesophageal reflux (GERD)    Arthritis     Past Surgical History:  Procedure Laterality Date   CARDIAC CATHETERIZATION  2007   stent to LAD and PTCA of diagonal branch   CARDIAC CATHETERIZATION  2010  CORONARY ANGIOPLASTY WITH STENT PLACEMENT  2013   95-99% prox LAD, patent LAD stent distal to this, occluded mid-dist LCx, mild <10% RCA irreg; s/p DES-prox LAD & DES to mid-dist LCx   CORONARY BALLOON ANGIOPLASTY N/A 05/16/2017   Procedure: Coronary Balloon Angioplasty;  Surgeon: Marykay Lex, MD;  Location: [facility] INVASIVE CV LAB;  Service: Cardiovascular;  Laterality: N/A;   ELBOW SURGERY  1996   right   ICD IMPLANT N/A 03/10/2021   Procedure: ICD IMPLANT;  Surgeon: Marinus Maw, MD;  Location: [facility] INVASIVE CV LAB;  Service: Cardiovascular;  Laterality: N/A;   KNEE SURGERY  1993   left   LEFT HEART CATH Bilateral  09/22/2012   Procedure: LEFT HEART CATH;  Surgeon: Peter M Swaziland, MD;  Location: [facility] CATH LAB;  Service: Cardiovascular;  Laterality: Bilateral;   LEFT HEART CATH AND CORONARY ANGIOGRAPHY N/A 05/16/2017   Procedure: Left Heart Cath and Coronary Angiography;  Surgeon: Marykay Lex, MD;  Location: [facility] INVASIVE CV LAB;  Service: Cardiovascular;  Laterality: N/A;   PERCUTANEOUS CORONARY STENT INTERVENTION (PCI-S) Right 09/22/2012   Procedure: PERCUTANEOUS CORONARY STENT INTERVENTION (PCI-S);  Surgeon: Peter M Swaziland, MD;  Location: [facility] CATH LAB;  Service: Cardiovascular;  Laterality: Right;       Home Medications    Prior to Admission medications   Medication Sig Start Date End Date Taking? Authorizing Provider  amLODipine (NORVASC) 2.5 MG tablet TAKE 1 TABLET BY MOUTH EVERY DAY 07/18/23  Yes Swaziland, Peter M, MD  amoxicillin-clavulanate (AUGMENTIN) 875-125 MG tablet Take 1 tablet by mouth every 12 (twelve) hours. 09/15/23  Yes Tomi Bamberger, PA-C  apixaban (ELIQUIS) 5 MG TABS tablet Take 1 tablet (5 mg total) by mouth 2 (two) times daily. Patient taking differently: Take 5 mg by mouth daily. 08/15/23  Yes Swazil[place], Peter M, MD  BESIVANCE 0.6 % SUSP Place 1 drop into the left eye 3 (three) times daily. Used for right eye recently. 09/12/23  Yes [provider]  carvedilol (COREG) 12.5 MG tablet TAKE 0.5 TABLETS (6.25 MG TOTAL) BY MOUTH 2 (TWO) TIMES DAILY. 10/13/22  Yes [place]azil[place], Peter M, MD  clopidogrel (PLAVIX) 75 MG tablet Take 75 mg by mouth daily. 08/01/23  Yes [provider]  DUREZOL 0.05 % EMUL Place 1 drop into the left eye 3 (three) times daily. Used recently in right eye. 09/12/23  Yes [provider]  esomeprazole (NEXIUM) 20 MG capsule Take 20 mg by mouth every morning.   Yes [provider]  Evolocumab (REPATHA SURECLICK) 140 MG/ML SOAJ Inject 140 mg into the skin every 14 (fourteen) days. 02/05/23  Yes Hilty, Lisette Abu, MD  lisinopril (ZESTRIL) 40 MG  tablet TAKE 1 TABLET BY MOUTH EVERY DAY 01/11/23  Yes [place]azil[place], Peter M, MD  PROLENSA 0.07 % SOLN Place 1 drop into the left eye 2 (two) times daily. Used most recently in right eye. 09/12/23  Yes [provider]  nitroGLYCERIN (NITROSTAT) 0.4 MG SL tablet Place 1 tablet (0.4 mg total) under the tongue every 5 (five) minutes as needed for chest pain. 08/15/23   Swaziland, Peter M, MD  valACYclovir (VALTREX) 500 MG tablet Take 1 tablet (500 mg total) by mouth as needed. 07/24/23   Marinus Maw, MD  XDEMVY 0.25 % SOLN Apply 1 drop to eye 2 (two) times daily. 08/20/23   [provider]    Family History Family History  Problem Relation Age of Onset   Heart attack Mother 61  Heart disease Mother    AAA (abdominal aortic aneurysm) Father    Heart disease Father     Social History Social History   Tobacco Use   Smoking status: Never    Passive exposure: Never   Smokeless tobacco: Never  Vaping Use   Vaping status: Never Used  Substance Use Topics   Alcohol use: No   Drug use: No     Allergies   Antihistamines, chlorpheniramine-type; Pheniramine; Statins; and Zetia [ezetimibe]   Review of Systems Review of Systems  Constitutional:  Negative for chills and fever.  HENT:  Positive for congestion, sinus pressure and sore throat. Negative for ear pain.   Eyes:  Negative for discharge and redness.  Respiratory:  Positive for cough. Negative for shortness of breath.   Gastrointestinal:  Negative for abdominal pain, nausea and vomiting.  Neurological:  Negative for headaches.     Physical Exam Triage Vital Signs ED Triage Vitals  Encounter Vitals Group     BP      Systolic BP Percentile      Diastolic BP Percentile      Pulse      Resp      Temp      Temp src      SpO2      Weight      Height      Head Circumference      Peak Flow      Pain Score      Pain Loc      Pain Education      Exclude from Growth Chart    No data found.  Updated Vital  Signs BP 116/78 (BP Location: Right Arm)   Pulse 77   Temp 97.7 F (36.5 C) (Oral)   Resp 18   Ht 5\' 8"  (1.727 m)   Wt 167 lb (75.8 kg)   SpO2 96%   BMI 25.39 kg/m      Physical Exam Vitals and nursing note reviewed.  Constitutional:      General: He is not in acute distress.    Appearance: Normal appearance. He is not ill-appearing.  HENT:     Head: Normocephalic and atraumatic.     Nose: Congestion present.     Mouth/Throat:     Mouth: Mucous membranes are moist.     Pharynx: Oropharynx is clear. No oropharyngeal exudate or posterior oropharyngeal erythema.  Eyes:     Conjunctiva/sclera: Conjunctivae normal.  Cardiovascular:     Rate and Rhythm: Normal rate and regular rhythm.     Heart sounds: Normal heart sounds. No murmur heard. Pulmonary:     Effort: Pulmonary effort is normal. No respiratory distress.     Breath sounds: Normal breath sounds. No wheezing, rhonchi or rales.  Skin:    General: Skin is warm and dry.  Neurological:     Mental Status: He is alert.  Psychiatric:        Mood and Affect: Mood normal.        Thought Content: Thought content normal.      UC Treatments / Results  Labs (all labs ordered are listed, but only abnormal results are displayed) Labs Reviewed - No data to display  EKG   Radiology No results found.  Procedures Procedures (including critical care time)  Medications Ordered in UC Medications - No data to display  Initial Impression / Assessment and Plan / UC Course  I have reviewed the triage vital signs and the nursing notes.  Pertinent labs & imaging results that were available during my care of the patient were reviewed by me and considered in my medical decision making (see chart for details).    Augmentin prescribed to cover sinusitis.  Encouraged follow-up if no gradual improvement or with any further concerns.  Final Clinical Impressions(s) / UC Diagnoses   Final diagnoses:  Acute sinusitis, recurrence  not specified, unspecified location   Discharge Instructions   None    ED Prescriptions     Medication Sig Dispense Auth. Provider   amoxicillin-clavulanate (AUGMENTIN) 875-125 MG tablet Take 1 tablet by mouth every 12 (twelve) hours. 14 tablet Tomi Bamberger, PA-C      PDMP not reviewed this encounter.   Tomi Bamberger, PA-C 09/25/23 1739

## 2023-09-15 NOTE — ED Triage Notes (Signed)
"  Started with sore throat on Thursday, then cough/congestion, and now sinus pressure with pain". "I have been getting sinus infections for 50+ yrs and know I need Amoxicillin". No fever. No ha.

## 2023-09-18 DIAGNOSIS — H269 Unspecified cataract: Secondary | ICD-10-CM | POA: Diagnosis not present

## 2023-09-18 DIAGNOSIS — H2512 Age-related nuclear cataract, left eye: Secondary | ICD-10-CM | POA: Diagnosis not present

## 2023-09-25 ENCOUNTER — Encounter: Payer: Self-pay | Admitting: Physician Assistant

## 2023-09-25 NOTE — Progress Notes (Signed)
Remote ICD transmission.   

## 2023-10-05 ENCOUNTER — Ambulatory Visit: Payer: Medicare HMO | Attending: Internal Medicine | Admitting: Internal Medicine

## 2023-10-05 ENCOUNTER — Encounter: Payer: Self-pay | Admitting: Internal Medicine

## 2023-10-05 VITALS — Ht 68.0 in | Wt 167.0 lb

## 2023-10-05 DIAGNOSIS — E782 Mixed hyperlipidemia: Secondary | ICD-10-CM

## 2023-10-05 DIAGNOSIS — Z9861 Coronary angioplasty status: Secondary | ICD-10-CM

## 2023-10-05 DIAGNOSIS — I2510 Atherosclerotic heart disease of native coronary artery without angina pectoris: Secondary | ICD-10-CM

## 2023-10-05 DIAGNOSIS — I480 Paroxysmal atrial fibrillation: Secondary | ICD-10-CM | POA: Diagnosis not present

## 2023-10-05 DIAGNOSIS — M791 Myalgia, unspecified site: Secondary | ICD-10-CM | POA: Diagnosis not present

## 2023-10-05 DIAGNOSIS — E785 Hyperlipidemia, unspecified: Secondary | ICD-10-CM

## 2023-10-05 DIAGNOSIS — T466X5A Adverse effect of antihyperlipidemic and antiarteriosclerotic drugs, initial encounter: Secondary | ICD-10-CM | POA: Diagnosis not present

## 2023-10-05 NOTE — Patient Instructions (Signed)
Medication Instructions:  NO CHANGES  *If you need a refill on your cardiac medications before your next appointment, please call your pharmacy*   Lab Work: FASTING lab work in 1 year to check cholesterol    Follow-Up: At [facility], you and your health needs are our priority.  As part of our continuing mission to provide you with exceptional heart care, we have created designated Provider Care Teams.  These Care Teams include your primary Cardiologist (physician) and Advanced Practice Providers (APPs -  Physician Assistants and Nurse Practitioners) who all work together to provide you with the care you need, when you need it.  We recommend signing up for the patient portal called "MyChart".  Sign up information is provided on this After Visit Summary.  MyChart is used to connect with patients for Virtual Visits (Telemedicine).  Patients are able to view lab/test results, encounter notes, upcoming appointments, etc.  Non-urgent messages can be sent to your provider as well.   To learn more about what you can do with MyChart, go to [email or web address].    Your next appointment:   12 months -- Dr. Rennis Golden -- lipid clinic

## 2023-10-05 NOTE — Progress Notes (Signed)
Virtual Visit via Video Note   This visit type was conducted due to national recommendations for restrictions regarding the COVID-19 Pandemic (e.g. social distancing) in an effort to limit this patient's exposure and mitigate transmission in our community.  Due to his co-morbid illnesses, this patient is at least at moderate risk for complications without adequate follow up.  This format is felt to be most appropriate for this patient at this time.  All issues noted in this document were discussed and addressed.  A limited physical exam was performed with this format.  Please refer to the patient's chart for his consent to telehealth for [facility].      Date:  10/05/2023   ID:  Brandon Gonzales, DOB 07/25/1951, MRN [id number] The patient was identified using 2 identifiers.  Evaluation Performed:  Follow-Up Visit  Patient Location:  [address] [place] [address]  Provider location:   [address], [address] [place], [place] [address]  PCP:  Carlean Jews, NP  Cardiologist:  Peter Swaziland, MD Electrophysiologist:  Lewayne Bunting, MD   Chief Complaint:  Follow-up dyslipidemia  History of Present Illness:    Brandon Gonzales is a 72 y.o. male who presents via audio/video conferencing for a telehealth visit today.  This is a pleasant 72 year old male with a complex cardiac history including coronary artery disease with stents to the LAD and circumflex with prior STEMI, hypertrophic cardiomyopathy who recently had an ICD placed, prior myocardial infarction, dyslipidemia, hypertension and stroke.  Unfortunately, he has been intolerant to numerous statins including rosuvastatin, atorvastatin and ezetimibe, all causing severe leg cramps.  He was referred to the research foundation and was enrolled in the CLEAR-outcomes trial, however he was randomized to placebo and therefore had no benefit from bempedoic acid.  He just completed the trial and was referred by his primary care  provider for options for management of his dyslipidemia.  His most recent lipid profile was in October however appears to be fairly stable with his other previous studies.  This demonstrated total cholesterol 208, triglycerides 238, HDL 27 and LDL of 133.  His target LDL is less than 70.  He says despite an excellent diet and work at trying to lower his cholesterol, he has not been able to get his LDL to target.  01/26/2022  Brandon Gonzales is seen today for follow-up.  He is doing well on Praluent.  He had a marked reduction in his lipids.  Total cholesterol now 111 (down from 210), triglycerides 87, HDL 40 and LDL 54, down from 155.  He is also tolerating the medicine very well.  Unfortunately he developed influenza.  He is now going to start on some Tamiflu and this is why we did a virtual visit today.  His only other concern was the cost of the medication.  He says he does not think he will qualify for need based grant.  Cost is gone up significantly though.  I believe he said $900 for 55-month supply  07/31/2022  Brandon Gonzales continues to do well.  He is tolerating Praluent without any issues.  He is on the 150 mg every 2 week dose.  Lipids have further improved with total cholesterol 90, triglycerides 98, HDL 35 and LDL 36, down from 54 about 8 months ago.  10/05/2023  Brandon Gonzales returns today for follow-up.  He has done very well on PCSK9 inhibitor therapy. He has switched from Praluent to Repatha due to insurance reasons.  Nonetheless he continues  to work well for him.  ALT 96, triglycerides 122, HDL 37 and LDL 37.  Liver enzymes have been normal.  His only other concern today is that recently he has been having significant bruising and easy bleeding without being able to get bleeding to stop.  This included was when he cut himself shaving or after he had some dermatologic procedures.  He had recently been started on Eliquis for atrial fibrillation that was detected on his device and also is on Plavix  given a history of coronary artery disease with prior stents.  Prior CV studies:   The following studies were reviewed today:  Chart reviewed, lab work  PMHx:  Past Medical History:  Diagnosis Date   Arthritis    Cancer (HCC)    SKIN CA  SQUAMOUS CELL   Coronary artery disease    a. 2007 Stent to LAD, PTCA D1;  b. DES to LAD & LCx 10/13 in the setting of STEMI.   Dyslipidemia    a. Statin and zetia-intolerant. On niacin.   GERD (gastroesophageal reflux disease)    Heart murmur    Hyperlipidemia    Phreesia 11/22/2020   Hypertension    Hypertrophic cardiomyopathy (HCC)    a. 09/2012 Echo: EF 55%, mid to apical anterolateral, posterior, apical anterior, apical HK, Gr1 DD, turbulence across LVOT suggesting degree of obstruction, mild MR, mildly dil LA.   Myocardial infarction (HCC)    Phreesia 11/22/2020   Stroke [facility])    Phreesia 11/22/2020    Past Surgical History:  Procedure Laterality Date   CARDIAC CATHETERIZATION  2007   stent to LAD and PTCA of diagonal branch   CARDIAC CATHETERIZATION  2010   CORONARY ANGIOPLASTY WITH STENT PLACEMENT  2013   95-99% prox LAD, patent LAD stent distal to this, occluded mid-dist LCx, mild <10% RCA irreg; s/p DES-prox LAD & DES to mid-dist LCx   CORONARY BALLOON ANGIOPLASTY N/A 05/16/2017   Procedure: Coronary Balloon Angioplasty;  Surgeon: Marykay Lex, MD;  Location: [facility] INVASIVE CV LAB;  Service: Cardiovascular;  Laterality: N/A;   ELBOW SURGERY  1996   right   ICD IMPLANT N/A 03/10/2021   Procedure: ICD IMPLANT;  Surgeon: Marinus Maw, MD;  Location: [facility] INVASIVE CV LAB;  Service: Cardiovascular;  Laterality: N/A;   KNEE SURGERY  1993   left   LEFT HEART CATH Bilateral 09/22/2012   Procedure: LEFT HEART CATH;  Surgeon: Peter M Swaziland, MD;  Location: [facility] CATH LAB;  Service: Cardiovascular;  Laterality: Bilateral;   LEFT HEART CATH AND CORONARY ANGIOGRAPHY N/A 05/16/2017   Procedure: Left Heart Cath and Coronary Angiography;   Surgeon: Marykay Lex, MD;  Location: [facility] INVASIVE CV LAB;  Service: Cardiovascular;  Laterality: N/A;   PERCUTANEOUS CORONARY STENT INTERVENTION (PCI-S) Right 09/22/2012   Procedure: PERCUTANEOUS CORONARY STENT INTERVENTION (PCI-S);  Surgeon: Peter M Swaziland, MD;  Location: [facility] CATH LAB;  Service: Cardiovascular;  Laterality: Right;    FAMHx:  Family History  Problem Relation Age of Onset   Heart attack Mother 62   Heart disease Mother    AAA (abdominal aortic aneurysm) Father    Heart disease Father     SOCHx:   reports that he has never smoked. He has never been exposed to tobacco smoke. He has never used smokeless tobacco. He reports that he does not drink alcohol and does not use drugs.  ALLERGIES:  Allergies  Allergen Reactions   Antihistamines, Chlorpheniramine-Type Other (See Comments)    Altered  mental status   Pheniramine Other (See Comments)    Altered mental status   Statins Other (See Comments)    Leg cramps     Zetia [Ezetimibe] Other (See Comments)    Leg cramps    MEDS:  Current Meds  Medication Sig   amLODipine (NORVASC) 2.5 MG tablet TAKE 1 TABLET BY MOUTH EVERY DAY   apixaban (ELIQUIS) 5 MG TABS tablet Take 1 tablet (5 mg total) by mouth 2 (two) times daily. (Patient taking differently: Take 5 mg by mouth daily.)   BESIVANCE 0.6 % SUSP Place 1 drop into the left eye 3 (three) times daily. Used for right eye recently.   carvedilol (COREG) 12.5 MG tablet TAKE 0.5 TABLETS (6.25 MG TOTAL) BY MOUTH 2 (TWO) TIMES DAILY.   clopidogrel (PLAVIX) 75 MG tablet Take 75 mg by mouth daily.   DUREZOL 0.05 % EMUL Place 1 drop into the left eye 3 (three) times daily. Used recently in right eye.   esomeprazole (NEXIUM) 20 MG capsule Take 20 mg by mouth every morning.   Evolocumab (REPATHA SURECLICK) 140 MG/ML SOAJ Inject 140 mg into the skin every 14 (fourteen) days.   lisinopril (ZESTRIL) 40 MG tablet TAKE 1 TABLET BY MOUTH EVERY DAY   nitroGLYCERIN (NITROSTAT) 0.4 MG SL  tablet Place 1 tablet (0.4 mg total) under the tongue every 5 (five) minutes as needed for chest pain.   PROLENSA 0.07 % SOLN Place 1 drop into the left eye 2 (two) times daily. Used most recently in right eye.   valACYclovir (VALTREX) 500 MG tablet Take 1 tablet (500 mg total) by mouth as needed.   XDEMVY 0.25 % SOLN Apply 1 drop to eye 2 (two) times daily.     ROS: Pertinent items noted in HPI and remainder of comprehensive ROS otherwise negative.  Labs/Other Tests and Data Reviewed:    Recent Labs: 10/24/2022: Hemoglobin 16.0; Platelets 169; TSH 3.740 08/09/2023: ALT 16; BUN 12; Creatinine, Ser 1.37; Potassium 5.0; Sodium 143   Recent Lipid Panel Lab Results  Component Value Date/Time   CHOL 96 (L) 08/09/2023 11:05 AM   TRIG 122 08/09/2023 11:05 AM   HDL 37 (L) 08/09/2023 11:05 AM   CHOLHDL 2.6 08/09/2023 11:05 AM   CHOLHDL 7.7 09/16/2020 04:28 AM   LDLCALC 37 08/09/2023 11:05 AM   LDLDIRECT 159.5 10/01/2012 08:47 AM    Wt Readings from Last 3 Encounters:  10/05/23 167 lb (75.8 kg)  09/15/23 167 lb (75.8 kg)  08/15/23 172 lb 6.4 oz (78.2 kg)     Exam:    Vital Signs:  Ht 5\' 8"  (1.727 m)   Wt 167 lb (75.8 kg)   BMI 25.39 kg/m    General appearance: alert, appears stated age, and no distress Lungs: No visual respiratory difficulty Abdomen: Normal weight Extremities: extremities normal, atraumatic, no cyanosis or edema Skin: Skin color, texture, turgor normal. No rashes or lesions Neurologic: Grossly normal : Pleasant  ASSESSMENT & PLAN:    Mixed dyslipidemia, goal LDL less than 55 Coronary artery disease with prior STEMI and multiple PCI's Hypertrophic cardiomyopathy status post AICD Hypertension History of stroke Statin and ezetimibe intolerance-myalgias  Brandon Gonzales has switched from Praluent to Repatha due to insurance reasons but continues to have excellent control of his lipids.  At some point we may wish to get an LP(a) although his therapy may not change  based on that.  In the future therapies may be available for him if it were elevated.  He  did report easy bleeding now that he is on Plavix and Eliquis.  I will bring this to the attention of his cardiologist and electrophysiologist to see if possibly they might consider stopping his Plavix or switching from Plavix to aspirin to help with his bleeding.  COVID-19 Education: The signs and symptoms of COVID-19 were discussed with the patient and how to seek care for testing (follow up with PCP or arrange E-visit).  The importance of social distancing was discussed today.  Patient Risk:   After full review of this patients clinical status, I feel that they are at least moderate risk at this time.  Time:   Today, I have spent 15 minutes with the patient with telehealth technology discussing dyslipidemia, statin intolerance, review of medical history.     Medication Adjustments/Labs and Tests Ordered: Current medicines are reviewed at length with the patient today.  Concerns regarding medicines are outlined above.   Tests Ordered: Orders Placed This Encounter  Procedures   Lipid panel     Medication Changes: No orders of the defined types were placed in this encounter.    Disposition:  in 1 year(s)  Chrystie Nose, MD, [facility], FACP  [place]  [facility]  Medical Director of the Advanced Lipid Disorders &  Cardiovascular Risk Reduction Clinic Diplomate of the American Board of Clinical Lipidology Attending Cardiologist  Direct Dial: [phone]  Fax: [phone]  Website:  [email or web address]  Chrystie Nose, MD  10/05/2023 11:43 AM

## 2023-10-13 ENCOUNTER — Other Ambulatory Visit: Payer: Self-pay | Admitting: Cardiology

## 2023-11-08 DIAGNOSIS — D2261 Melanocytic nevi of right upper limb, including shoulder: Secondary | ICD-10-CM | POA: Diagnosis not present

## 2023-11-08 DIAGNOSIS — Z85820 Personal history of malignant melanoma of skin: Secondary | ICD-10-CM | POA: Diagnosis not present

## 2023-11-08 DIAGNOSIS — L821 Other seborrheic keratosis: Secondary | ICD-10-CM | POA: Diagnosis not present

## 2023-11-08 DIAGNOSIS — L820 Inflamed seborrheic keratosis: Secondary | ICD-10-CM | POA: Diagnosis not present

## 2023-11-08 DIAGNOSIS — Z85828 Personal history of other malignant neoplasm of skin: Secondary | ICD-10-CM | POA: Diagnosis not present

## 2023-11-08 DIAGNOSIS — D485 Neoplasm of uncertain behavior of skin: Secondary | ICD-10-CM | POA: Diagnosis not present

## 2023-11-08 DIAGNOSIS — D1801 Hemangioma of skin and subcutaneous tissue: Secondary | ICD-10-CM | POA: Diagnosis not present

## 2023-11-08 DIAGNOSIS — L812 Freckles: Secondary | ICD-10-CM | POA: Diagnosis not present

## 2023-12-10 ENCOUNTER — Ambulatory Visit (INDEPENDENT_AMBULATORY_CARE_PROVIDER_SITE_OTHER): Payer: Medicare HMO

## 2023-12-10 DIAGNOSIS — I495 Sick sinus syndrome: Secondary | ICD-10-CM | POA: Diagnosis not present

## 2023-12-10 DIAGNOSIS — I4729 Other ventricular tachycardia: Secondary | ICD-10-CM

## 2023-12-11 LAB — CUP PACEART REMOTE DEVICE CHECK
Battery Remaining Longevity: 73 mo
Battery Remaining Percentage: 71 %
Battery Voltage: 3.01 V
Brady Statistic AP VP Percent: 1 %
Brady Statistic AP VS Percent: 55 %
Brady Statistic AS VP Percent: 1 %
Brady Statistic AS VS Percent: 42 %
Brady Statistic RA Percent Paced: 51 %
Brady Statistic RV Percent Paced: 1 %
Date Time Interrogation Session: 20250105210149
HighPow Impedance: 73 Ohm
Implantable Lead Connection Status: 753985
Implantable Lead Connection Status: 753985
Implantable Lead Implant Date: 20220407
Implantable Lead Implant Date: 20220407
Implantable Lead Location: 753859
Implantable Lead Location: 753860
Implantable Lead Model: 7122
Implantable Pulse Generator Implant Date: 20220407
Lead Channel Impedance Value: 390 Ohm
Lead Channel Impedance Value: 550 Ohm
Lead Channel Pacing Threshold Amplitude: 0.5 V
Lead Channel Pacing Threshold Amplitude: 0.75 V
Lead Channel Pacing Threshold Pulse Width: 0.5 ms
Lead Channel Pacing Threshold Pulse Width: 0.5 ms
Lead Channel Sensing Intrinsic Amplitude: 1.7 mV
Lead Channel Sensing Intrinsic Amplitude: 11.7 mV
Lead Channel Setting Pacing Amplitude: 2 V
Lead Channel Setting Pacing Amplitude: 2.5 V
Lead Channel Setting Pacing Pulse Width: 0.5 ms
Lead Channel Setting Sensing Sensitivity: 0.5 mV
Pulse Gen Serial Number: 810023633
Zone Setting Status: 755011

## 2023-12-18 ENCOUNTER — Other Ambulatory Visit: Payer: Self-pay | Admitting: Cardiology

## 2023-12-20 NOTE — Telephone Encounter (Signed)
Copied from CRM [id number]. Topic: General - Inquiry >> Dec 20, 2023  3:55 PM Suzette B wrote: Reason for CRM: Patient has called in requesting a referral to see a neurologist after advising the patient he needed to see the provider he stated that he was upset about the way things were set up I apologized and noticed all appointments is not until feb. Patient stated he wanted to speak with the nurse to get him an appointment but he wasn't sure who the provider was.  Please reach out to  patient to assist with the referral [phone]

## 2023-12-20 NOTE — Telephone Encounter (Signed)
I contacted pt and scheduled this appointment with dr Constance Goltz

## 2023-12-25 ENCOUNTER — Encounter: Payer: Self-pay | Admitting: Family Medicine

## 2023-12-25 ENCOUNTER — Ambulatory Visit (INDEPENDENT_AMBULATORY_CARE_PROVIDER_SITE_OTHER): Payer: Medicare HMO | Admitting: Family Medicine

## 2023-12-25 VITALS — BP 145/83 | HR 61 | Ht 68.0 in | Wt 174.4 lb

## 2023-12-25 DIAGNOSIS — I480 Paroxysmal atrial fibrillation: Secondary | ICD-10-CM | POA: Diagnosis not present

## 2023-12-25 DIAGNOSIS — N183 Chronic kidney disease, stage 3 unspecified: Secondary | ICD-10-CM

## 2023-12-25 DIAGNOSIS — R251 Tremor, unspecified: Secondary | ICD-10-CM

## 2023-12-25 NOTE — Progress Notes (Unsigned)
   Established Patient Office Visit  Subjective   Patient ID: CASMER LANE, male    DOB: 02-Nov-1951  Age: 73 y.o. MRN: [id number]  No chief complaint on file.   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   Roundup.  Difficulty shaving.    Dr. Lucia Gaskins.  Does migraines only.    Notices in the hands.    Pronunciation of wods is sometimes harder to get out.      Coronary artery disease-sees Dr. Rennis Golden.  On Repatha.  A-fib-bleeding and bruising.  A-fib was detected on his device.  On Plavix as well.  Dr. Ladona Ridgel pacemaker.     The ASCVD Risk score (Arnett DK, et al., 2019) failed to calculate for the following reasons:   Risk score cannot be calculated because patient has a medical history suggesting prior/existing ASCVD  Health Maintenance Due  Topic Date Due   COVID-19 Vaccine (1) Never done   Pneumonia Vaccine 16+ Years old (1 of 2 - PCV) Never done   DTaP/Tdap/Td (1 - Tdap) Never done   Zoster Vaccines- Shingrix (1 of 2) Never done   Medicare Annual Wellness (AWV)  11/24/2021   INFLUENZA VACCINE  07/05/2023      Objective:     There were no vitals taken for this visit. {Vitals History (Optional):23777}  Physical Exam   No results found for any visits on 12/25/23.      Assessment & Plan:   There are no diagnoses linked to this encounter.   No follow-ups on file.    Sandre Kitty, MD

## 2023-12-25 NOTE — Patient Instructions (Signed)
It was nice to see you today,  We addressed the following topics today: -I think the tremors you are experiencing are more likely essential tremor than Parkinson's, but I will send in a referral to the neurologist.  Someone should call you in the next 3 weeks to schedule appointment.  If you have not heard from them let us know. - When you talk to Dr. Swaziland again you can ask him about nonmedication options to use instead of Eliquis.  He can let you know if you are appropriate for any of those procedures.  Have a great day,  Frederic Jericho, MD

## 2023-12-27 DIAGNOSIS — R251 Tremor, unspecified: Secondary | ICD-10-CM | POA: Insufficient documentation

## 2023-12-27 NOTE — Assessment & Plan Note (Signed)
Patient on Eliquis and clopidogrel.  Started taking the Eliquis daily due to difficulties with easy bruising and bleeding that was hard to control.  He has discussed this with his cardiologist who would prefer the patient to take it as directed.  Has pacemaker/defibrillator.  Given his issues with bleeding, I advised him to talk to his cardiologist at their next appointment regarding nonpharmacologic options in place of anticoagulants and if they would be appropriate in his situation.

## 2023-12-27 NOTE — Assessment & Plan Note (Signed)
Patient noticed upper extremity tremor with movement.  No resting tremors.  Exam did not find anything concerning for Parkinson's, most likely he has essential tremor.  He is on carvedilol for heart disease.  Symptoms are not interfering with his IADLs.  Even so, the patient would still prefer to speak to a neurologist for confirmation.  This is based more so on his previous health issues and preferring to be on the safe side rather than wait too late to treat a potential problem.  Will send in referral to neurology.

## 2023-12-27 NOTE — Assessment & Plan Note (Signed)
GFR stable, last checked approximately 3 months ago.  I believe he has some labs upcoming with his cardiologist so will defer labs at this time.  Continue to monitor.  No medication changes needed at this time.

## 2024-01-13 ENCOUNTER — Other Ambulatory Visit: Payer: Self-pay | Admitting: Cardiology

## 2024-01-20 ENCOUNTER — Other Ambulatory Visit: Payer: Self-pay | Admitting: Internal Medicine

## 2024-01-21 NOTE — Progress Notes (Signed)
 Remote ICD transmission.

## 2024-01-21 NOTE — Addendum Note (Signed)
Addended by: Geralyn Flash D on: 01/21/2024 11:16 AM   Modules accepted: Orders

## 2024-02-07 ENCOUNTER — Other Ambulatory Visit: Payer: Self-pay | Admitting: Cardiology

## 2024-02-11 NOTE — Progress Notes (Unsigned)
 Cardiology Office Note   Date:  02/11/2024   ID:  Brandon Gonzales, DOB 04-16-1951, MRN [id number]  PCP:  Sandre Kitty, MD  Cardiologist:   Belanna Manring [place], MD   No chief complaint on file.       History of Present Illness: Brandon Gonzales is a 73 y.o. male who presents for follow up CAD and HCM. He has a PMH including CAD prior mid LAD stenting in 2007 with  PTCA to D1, followed by lateral STEMI and PCI/DES to the midLCX & proximalLAD in 2013. S/p cutting balloon PTCA of the LAD in June 2019 for in stent restenosis.  He also has a h/o HTN, HL, GERD, and hypertrophic cardiomyopathy.     He called on 02/19/17 with complaints of dyspnea and dizziness that he states were similar to when he had his MI. He felt very flushed in his face. He was referred to the ED.  BP recorded by EMS was 210/107. Came down to 159/87 in ED.  Ecg showed NSR with a RBBB- new in January 2018. No acute change. Delta troponin was normal. UA, BMET, BNP were all normal. He was discharged on amlodipine 5 mg daily.    He was evaluated with Echo in April 2018 showing moderate LVH with severe basal septal hypertrophy. No significant outflow gradient. Mild MR. Myoview study showed a small area of scar at the base of the lateral wall. No ischemia. EF 51%. He presented in June 2018 with symptoms of worsening chest pain and dyspnea on exertion. He underwent cardiac cath showing 95% in stent restenosis of the proximal LAD treated with cutting  balloon angioplasty. There was a 65% mid RCA stenosis and 50% in the mid LCx. He was enrolled in the CLEAR trial for hyperlipidemia given history of statin and Zetia intolerance.   The patient was seen in the office 12/17/2019 after an episode of near syncope a couple on New Year's Day.  They had just eaten and he was in his car with his daughter (who is a paramedic) driving a plate of food over to his brother's house.  While driving he became weak and near syncopal.  He denied any  diaphoresis, shortness of breath, or nausea and vomiting.  His daughter took his pulse and thought he might be in atrial fibrillation but if it was present it did not last long.  His symptoms resolved quickly.  He did have a recurrent episode the following 2 days but none since.    A 7 day ZIO monitor was done and showed episodic bradycardia down to the 40"s- usually early am.  He also had frequent PVCs and short runs of PSVT.  In addition he had runs of NSWCT that appear to be NSVT. The longest was 14 beats.  He has had no further episodes of near syncope or symptomatic tachycardia.  Cardiac MRI was ordered with results noted below. Given syncopal episode, wide complex tachycardia noted on monitor, HCM with late gadolinium uptake on MRI and relatively low EF 52% I recommended EP evaluation to see if ICD indicated. He initially deferred. Seen by Dr Ladona Ridgel on 03/23/20. He did eventually undergo ICD implant in April 2022.   He was admitted in October 2021 with a stroke. Had right arm and 4th and 5th finger numbness and weakness. Multiple left MCA infarcts embolic secondary to unknown source.CTA head & neck advanced atherosclerosis B ICA bifurcation and bulbs. B ICA 30% stenosis w/ pronounced irregularity. Proximal R Subclavian  50%. R VA origin 30%. L VA origin 50%. Mid to distal BA. No LVO. MRI  showed Punctate L primary motor cortec and L subcortical parietal white matter infarcts. MR CS multilevel CX spondylosis w/ moderate and severe foraminal stenosis / narrowing C4-11. Echo showed no cardioembolic source. He was seen by EP for consideration of loop recorder but after discussion he did agree to have ICD implant. He was subsequently started on Praluent for his hyperlipidemia.   He was seen in the ED on 08/23/21 with chest pain. States he had severe chest pain radiating into his neck bilateral and it hurt to take a deep breath. This lasted a couple of hours at least. Did not change with sl Ntg x 1. States his  pain eventually wore off. BP was elevated at 200/104.  Patient's initial evaluation was started in the ED but patient did not stay he left the ED AMA. Overall labs were stable.  Troponin level was slightly elevated at 22 and EKG unchanged from prior. He states pain is different than when he had blockage before. Suspect this was more reflux.   On follow up today he is doing well. On Eliquis since Afib noted on device. Denies any palpitations, dizziness, chest pain or dyspnea. Still working full time but planning to retire in June. He does note a mild tremor and has evaluation planned with Neuro.    Past Medical History:  Diagnosis Date   Arthritis    Cancer (HCC)    SKIN CA  SQUAMOUS CELL   Coronary artery disease    a. 2007 Stent to LAD, PTCA D1;  b. DES to LAD & LCx 10/13 in the setting of STEMI.   Dyslipidemia    a. Statin and zetia-intolerant. On niacin.   GERD (gastroesophageal reflux disease)    Heart murmur    Hyperlipidemia    Phreesia 11/22/2020   Hypertension    Hypertrophic cardiomyopathy (HCC)    a. 09/2012 Echo: EF 55%, mid to apical anterolateral, posterior, apical anterior, apical HK, Gr1 DD, turbulence across LVOT suggesting degree of obstruction, mild MR, mildly dil LA.   Myocardial infarction (HCC)    Phreesia 11/22/2020   Stroke [facility])    Phreesia 11/22/2020    Past Surgical History:  Procedure Laterality Date   CARDIAC CATHETERIZATION  2007   stent to LAD and PTCA of diagonal branch   CARDIAC CATHETERIZATION  2010   CORONARY ANGIOPLASTY WITH STENT PLACEMENT  2013   95-99% prox LAD, patent LAD stent distal to this, occluded mid-dist LCx, mild <10% RCA irreg; s/p DES-prox LAD & DES to mid-dist LCx   CORONARY BALLOON ANGIOPLASTY N/A 05/16/2017   Procedure: Coronary Balloon Angioplasty;  Surgeon: Marykay Lex, MD;  Location: [facility] INVASIVE CV LAB;  Service: Cardiovascular;  Laterality: N/A;   ELBOW SURGERY  1996   right   ICD IMPLANT N/A 03/10/2021   Procedure: ICD  IMPLANT;  Surgeon: Marinus Maw, MD;  Location: [facility] INVASIVE CV LAB;  Service: Cardiovascular;  Laterality: N/A;   KNEE SURGERY  1993   left   LEFT HEART CATH Bilateral 09/22/2012   Procedure: LEFT HEART CATH;  Surgeon: Paisly Fingerhut M Swaziland, MD;  Location: [facility] CATH LAB;  Service: Cardiovascular;  Laterality: Bilateral;   LEFT HEART CATH AND CORONARY ANGIOGRAPHY N/A 05/16/2017   Procedure: Left Heart Cath and Coronary Angiography;  Surgeon: Marykay Lex, MD;  Location: [facility] INVASIVE CV LAB;  Service: Cardiovascular;  Laterality: N/A;   PERCUTANEOUS CORONARY STENT INTERVENTION (  PCI-S) Right 09/22/2012   Procedure: PERCUTANEOUS CORONARY STENT INTERVENTION (PCI-S);  Surgeon: Mandy Fitzwater M Swaziland, MD;  Location: [facility] CATH LAB;  Service: Cardiovascular;  Laterality: Right;     Current Outpatient Medications  Medication Sig Dispense Refill   amLODipine (NORVASC) 2.5 MG tablet TAKE 1 TABLET BY MOUTH EVERY DAY 90 tablet 2   apixaban (ELIQUIS) 5 MG TABS tablet Take 1 tablet (5 mg total) by mouth 2 (two) times daily. (Patient taking differently: Take 5 mg by mouth daily.) 180 tablet 3   carvedilol (COREG) 12.5 MG tablet TAKE 0.5 TABLETS (6.25 MG TOTAL) BY MOUTH 2 (TWO) TIMES DAILY. 90 tablet 2   clopidogrel (PLAVIX) 75 MG tablet TAKE 1 TABLET BY MOUTH EVERY DAY 90 tablet 3   esomeprazole (NEXIUM) 20 MG capsule Take 20 mg by mouth every morning.     Evolocumab (REPATHA SURECLICK) 140 MG/ML SOAJ INJECT 140 MG INTO THE SKIN EVERY 14 (FOURTEEN) DAYS. 6 mL 3   lisinopril (ZESTRIL) 40 MG tablet TAKE 1 TABLET BY MOUTH EVERY DAY 90 tablet 3   nitroGLYCERIN (NITROSTAT) 0.4 MG SL tablet Place 1 tablet (0.4 mg total) under the tongue every 5 (five) minutes as needed for chest pain. 25 tablet 2   valACYclovir (VALTREX) 500 MG tablet Take 1 tablet (500 mg total) by mouth as needed. 60 tablet 0   No current facility-administered medications for this visit.    Allergies:   Antihistamines, chlorpheniramine-type; Pheniramine;  Statins; and Zetia [ezetimibe]    Social History:  The patient  reports that he has never smoked. He has never been exposed to tobacco smoke. He has never used smokeless tobacco. He reports that he does not drink alcohol and does not use drugs.   Family History:  The patient's family history includes AAA (abdominal aortic aneurysm) in his father; Heart attack (age of onset: 89) in his mother; Heart disease in his father and mother.    ROS:  Please see the history of present illness.   Otherwise, review of systems are positive for none.   All other systems are reviewed and negative.    PHYSICAL EXAM: VS:  There were no vitals taken for this visit. , BMI There is no height or weight on file to calculate BMI. GEN: Well nourished, well developed, in no acute distress  HEENT: normal  Neck: no JVD, carotid bruits, or masses Cardiac: RRR;  rubs, or gallops,no edema. Harsh gr 2/6 systolic murmur LSB and apex. Respiratory:  clear to auscultation bilaterally, normal work of breathing GI: soft, nontender, nondistended, + BS MS: no deformity or atrophy  Skin: warm and dry, no rash Neuro:  Strength and sensation are intact Psych: euthymic mood, full affect   EKG:  EKG is not ordered today.     Recent Labs: 08/09/2023: ALT 16; BUN 12; Creatinine, Ser 1.37; Potassium 5.0; Sodium 143    Lipid Panel    Component Value Date/Time   CHOL 96 (L) 08/09/2023 1105   TRIG 122 08/09/2023 1105   HDL 37 (L) 08/09/2023 1105   CHOLHDL 2.6 08/09/2023 1105   CHOLHDL 7.7 09/16/2020 0428   VLDL 48 (H) 09/16/2020 0428   LDLCALC 37 08/09/2023 1105   LDLDIRECT 159.5 10/01/2012 0847      Wt Readings from Last 3 Encounters:  12/25/23 174 lb 6.4 oz (79.1 kg)  10/05/23 167 lb (75.8 kg)  09/15/23 167 lb (75.8 kg)      Other studies Reviewed: Additional studies/ records that were reviewed today include:  Cardiac cath 05/16/17:  Coronary Balloon Angioplasty  Left Heart Cath and Coronary Angiography   Conclusion    Ost RCA lesion, 40 %stenosed. Mid RCA lesion, 65 %stenosed. Borderline significant. The left ventricular systolic function is normal. LV end diastolic pressure is mildly elevated. There is no mitral valve regurgitation. There is no aortic valve stenosis. Prox LAD to Mid LAD bare-metal stent, 20 %stenosed. Mid LAD lesion, 40 %stenosed. Localized segment of previous bare-metal stent- 2nd Mrg DES stent, 0 %stenosed. Prox Cx to Mid Cx lesion, 40 %stenosed. Ost LAD lesion, 95 %stenosed. Post intervention, there is a 10% residual stenosis.   Mr. Lauf has several potential culprit lesions, however the most obvious one in the LAD with 95% stenosis. This is in-stent restenosis, therefore treated with balloon angioplasty using the [facility] Cutting Balloon followed by post-dilation with a noncompliant balloon.   The RCA lesion is of borderline significance, but does not appear to be acute in nature. Would defer intervention on this vessel at this time unless she has ongoing or worsening symptoms.     Plan: Overnight monitoring and likely discharge tomorrow Return to nursing unit for ongoing care and TR been removal. Back on dual antiplatelet therapy.- Reduced aspirin 81 mg him and using Brilinta. Further risk factor modification per rounding team.     He can follow-up with Dr. Sw[place], will need to have a follow-up appointment rescheduled as he was post be there tomorrow June 14.       Bryan Lemma, M.D., M.S. Interventional Cardiologist      Echo 07/14/19: IMPRESSIONS     1. The left ventricle has normal systolic function with an ejection  fraction of 60-65%. The cavity size was normal. Severe basal septal  hypertrophy and mild concentric hypertrophy. Left ventricular diastolic  Doppler parameters are consistent with  pseudonormalization. Elevated left ventricular end-diastolic pressure.   2. The average left ventricular global longitudinal strain is -16.0 %.   3.  The right ventricle has normal systolic function. The cavity was  normal. There is no increase in right ventricular wall thickness.   4. Left atrial size was mildly dilated.   5. The aortic valve is tricuspid. Moderate sclerosis of the aortic valve.  Aortic valve regurgitation is mild by color flow Doppler.   6. The aorta is normal in size and structure.   Cardiac MRI 02/25/20: CLINICAL DATA:  Hypertrophic cardiomyopathy suspected, further testing   COMPARISON: Echocardiogram 07/14/2019   EXAM: CARDIAC MRI   TECHNIQUE: The patient was scanned on a 1.5 Tesla GE magnet. A dedicated cardiac coil was used. Functional imaging was done using Fiesta sequences. 2,3, and 4 chamber views were done to assess for RWMA's. Modified Simpson's rule using a short axis stack was used to calculate an ejection fraction on a dedicated work [employer]. The patient received 10mL GADAVIST GADOBUTROL 1 MMOL/ML IV SOLN. After 10 minutes inversion recovery sequences were used to assess for infiltration and scar tissue.   CONTRAST:  10mL GADAVIST GADOBUTROL 1 MMOL/ML IV SOLN   FINDINGS: LEFT VENTRICLE:   Normal LV chamber size.   Wall thickness is increased in the basal septum as described below. Wall thinning in the anterior wall and inferolateral wall. The lateral wall demonstrates scar and is significantly thinned.   Akinesis of the inferolateral wall from base to mid ventricle, and hypokinesis of lateral apex. Hypokinesis of the anterior wall from base to apex.   LVEF = 52%   Maximal wall thickness: 18 mm  Location: Basal anteroseptum   There is systolic anterior motion of the mitral valve. Flow dephasing suggests left ventricular outflow tract obstruction. Mitral regurgitation is seen, posteriorly directed, eccentric and qualitatively mild-moderate.   No evidence of left ventricular apical aneurysm.   Findings are consistent with hypertrophic cardiomyopathy  with obstruction. Morphologic subtype: Sigmoid.   There is late gadolinium enhancement in the left ventricular myocardium.   LGE noted subendocardium of the inferolateral and the inferior portion of the anterolateral wall from base to mid ventricle, including a mildly aneurysmal appearing inferolateral mid ventricular segment. Delayed myocardial enhancement is <50% of the thickness of the lateral wall, suggesting viability in this coronary distribution.   There is also LGE in the subendocardium of the anterior wall from base to mid ventricle. Delayed myocardial enhancement is <50% of the thickness of the anterior wall, suggesting viability in this coronary distribution.   Mild delayed myocardial enhancement noted at the RV insertion points. This represents <1% of the myocardial mass. Quantitation of LGE not performed in the setting of prominent delayed gadolinium enhancement from ischemic heart disease.   Normal T1 myocardial nulling kinetics suggests against a diagnosis of cardiac amyloidosis.   ECV = 22%, normal.   RIGHT VENTRICLE:   Normal right ventricular size, thickness and systolic function (RVEF =60%). There are no regional wall motion abnormalities.   ATRIA:   Mild left atrial enlargement.  Normal right atrial chamber size.   VALVES:   Mild-moderate mitral valve regurgitation. Due to systolic anterior motion by flow dephasing.   Trivial aortic valve regurgitation by flow dephasing.   PERICARDIUM:   Normal pericardium.  Trace circumferential pericardial effusion.   OTHER: No significant extracardiac findings noted.   MEASUREMENTS:   LVEDV: 174 mL   LVESV: 83 mL   SV: 91 mL   CO: 5.5 L/min   Myocardial mass: 160 g   RVEDV: 121 mL   RVEDS: 49 mL   RVSV: 72 mL   IMPRESSION: 1.  Normal left ventricular size, LVEF 52%.   2. Akinesis of the basal-mid inferolateral wall with scar and wall thinning. Hypokinesis of the basal-apical anterior  wall. Subendocardial delayed enhancement that is <50% thickness of the myocardium, suggestive of ischemic heart disease with viability in these regions.   3. Hypertrophic cardiomyopathy, sigmoid subtype. 18 mm basal anteroseptum.   4. Left ventricular outflow tract obstruction with systolic anterior motion of the mitral valve and qualitatively mild-moderate mitral valve regurgitation.   5. Mild myocardial enhancement in the RV insertion points at the base. No significant delayed myocardial enhancement associated with area of myocardial thickening.   6. No evidence of cardiac amyloidosis. Normal extracellular volume, 22%.   7.  Normal right ventricular size and function, RVEF 60%   8.  Trace pericardial effusion.     Electronically Signed   By: Weston Brass   On: 02/29/2020 18:43   Echo 09/16/20: IMPRESSIONS     1. Left ventricular ejection fraction, by estimation, is 65 to 70%. The  left ventricle has normal function. The left ventricle has no regional  wall motion abnormalities. There is moderate concentric left ventricular  hypertrophy. Left ventricular  diastolic parameters are consistent with Grade I diastolic dysfunction  (impaired relaxation).   2. Right ventricular systolic function is normal. The right ventricular  size is normal.   3. Left atrial size was moderately dilated.   4. The mitral valve is normal in structure. Trivial mitral valve  regurgitation. No evidence of mitral stenosis.  5. The aortic valve is normal in structure. There is mild calcification  of the aortic valve. There is mild thickening of the aortic valve. Aortic  valve regurgitation is mild. No aortic stenosis is present.   6. The inferior vena cava is normal in size with greater than 50%  respiratory variability, suggesting right atrial pressure of 3 mmHg.   Conclusion(s)/Recommendation(s): No intracardiac source of embolism  detected on this transthoracic study. A transesophageal  echocardiogram is  recommended to exclude cardiac source of embolism if clinically indicated.   ASSESSMENT AND PLAN:    1. Hypertrophic CM:  Most recent Echo in Oct 2021 showed no significant LVOT gradient.  Moderate LVH and normal EF. Late gadolinium enhancement. He has NSVT noted on monitor. Now s/p ICD implant.  He is asymptomatic.    2.  CAD: s/p prior LAD, D1, and LCX PCI with STEMI in 2013. Admitted in June 2018 with in stent restenosis of the proximal LAD. Symptoms predominantly of DOE. Treated with cutting balloon PCI. Interestedly his Myoview study prior was normal so may not be a reliable study.  Currently no symptoms. Continue amlodipine and Coreg.  Would continue Plavix but not ASA since now on Eliquis.    3.  HL:   intolerant to statins and zetia. On CLEAR trial now. LDL was 155. Now on Repatha.  Excellent response with LDL 37    4.  Hypertension:  BP is well controlled.    5.  S/p CVA October 2021. Likely related to embolus nonobstructive but extensive carotid arterial disease.   6.  Paroxysmal Afib noted on device check. [place] Vasc score of 5. Now on Eliquis.    .      Current medicines are reviewed at length with the patient today.  The patient does not have concerns regarding medicines.  The following changes have been made:  no change  Labs/ tests ordered today include:  No orders of the defined types were placed in this encounter.     Disposition:   FU with me in 6 months.  Signed, Aashi Derrington Sw[place], MD  02/11/2024 8:18 AM    [facility] [address], [place], [place], [address] Phone [phone], Fax [phone]

## 2024-02-13 ENCOUNTER — Encounter: Payer: Self-pay | Admitting: Cardiology

## 2024-02-13 ENCOUNTER — Ambulatory Visit: Payer: Medicare HMO | Attending: Cardiology | Admitting: Cardiology

## 2024-02-13 VITALS — BP 134/68 | HR 60 | Ht 68.0 in | Wt 175.0 lb

## 2024-02-13 DIAGNOSIS — I4729 Other ventricular tachycardia: Secondary | ICD-10-CM

## 2024-02-13 DIAGNOSIS — I2510 Atherosclerotic heart disease of native coronary artery without angina pectoris: Secondary | ICD-10-CM | POA: Diagnosis not present

## 2024-02-13 DIAGNOSIS — I495 Sick sinus syndrome: Secondary | ICD-10-CM | POA: Diagnosis not present

## 2024-02-13 DIAGNOSIS — Z95810 Presence of automatic (implantable) cardiac defibrillator: Secondary | ICD-10-CM | POA: Diagnosis not present

## 2024-02-13 DIAGNOSIS — I480 Paroxysmal atrial fibrillation: Secondary | ICD-10-CM | POA: Diagnosis not present

## 2024-02-13 DIAGNOSIS — I422 Other hypertrophic cardiomyopathy: Secondary | ICD-10-CM

## 2024-02-13 DIAGNOSIS — Z9861 Coronary angioplasty status: Secondary | ICD-10-CM

## 2024-02-13 NOTE — Patient Instructions (Signed)
 Medication Instructions:  Continue same medications *If you need a refill on your cardiac medications before your next appointment, please call your pharmacy*   Lab Work: None ordered   Testing/Procedures: None ordered   Follow-Up: At [facility], you and your health needs are our priority.  As part of our continuing mission to provide you with exceptional heart care, we have created designated Provider Care Teams.  These Care Teams include your primary Cardiologist (physician) and Advanced Practice Providers (APPs -  Physician Assistants and Nurse Practitioners) who all work together to provide you with the care you need, when you need it.  We recommend signing up for the patient portal called "MyChart".  Sign up information is provided on this After Visit Summary.  MyChart is used to connect with patients for Virtual Visits (Telemedicine).  Patients are able to view lab/test results, encounter notes, upcoming appointments, etc.  Non-urgent messages can be sent to your provider as well.   To learn more about what you can do with MyChart, go to [email or web address].    Your next appointment:  6 months   Call in May to schedule Sept appointment     Provider:  Dr.Jordan

## 2024-03-08 ENCOUNTER — Other Ambulatory Visit: Payer: Self-pay | Admitting: Cardiology

## 2024-03-10 ENCOUNTER — Ambulatory Visit (INDEPENDENT_AMBULATORY_CARE_PROVIDER_SITE_OTHER): Payer: Medicare HMO

## 2024-03-10 DIAGNOSIS — I495 Sick sinus syndrome: Secondary | ICD-10-CM

## 2024-03-11 LAB — CUP PACEART REMOTE DEVICE CHECK
Battery Remaining Longevity: 69 mo
Battery Remaining Percentage: 68 %
Battery Voltage: 3.01 V
Brady Statistic AP VP Percent: 1 %
Brady Statistic AP VS Percent: 55 %
Brady Statistic AS VP Percent: 1 %
Brady Statistic AS VS Percent: 43 %
Brady Statistic RA Percent Paced: 52 %
Brady Statistic RV Percent Paced: 1 %
Date Time Interrogation Session: 20250406220758
HighPow Impedance: 70 Ohm
Implantable Lead Connection Status: 753985
Implantable Lead Connection Status: 753985
Implantable Lead Implant Date: 20220407
Implantable Lead Implant Date: 20220407
Implantable Lead Location: 753859
Implantable Lead Location: 753860
Implantable Lead Model: 7122
Implantable Pulse Generator Implant Date: 20220407
Lead Channel Impedance Value: 330 Ohm
Lead Channel Impedance Value: 460 Ohm
Lead Channel Pacing Threshold Amplitude: 0.5 V
Lead Channel Pacing Threshold Amplitude: 0.75 V
Lead Channel Pacing Threshold Pulse Width: 0.5 ms
Lead Channel Pacing Threshold Pulse Width: 0.5 ms
Lead Channel Sensing Intrinsic Amplitude: 1.1 mV
Lead Channel Sensing Intrinsic Amplitude: 11.8 mV
Lead Channel Setting Pacing Amplitude: 2 V
Lead Channel Setting Pacing Amplitude: 2.5 V
Lead Channel Setting Pacing Pulse Width: 0.5 ms
Lead Channel Setting Sensing Sensitivity: 0.5 mV
Pulse Gen Serial Number: 810023633
Zone Setting Status: 755011

## 2024-03-12 ENCOUNTER — Encounter: Payer: Self-pay | Admitting: Internal Medicine

## 2024-03-31 ENCOUNTER — Ambulatory Visit (INDEPENDENT_AMBULATORY_CARE_PROVIDER_SITE_OTHER): Payer: Medicare HMO | Admitting: Family Medicine

## 2024-03-31 ENCOUNTER — Encounter: Payer: Self-pay | Admitting: Family Medicine

## 2024-03-31 VITALS — BP 137/85 | HR 61 | Ht 68.0 in | Wt 176.0 lb

## 2024-03-31 DIAGNOSIS — B001 Herpesviral vesicular dermatitis: Secondary | ICD-10-CM

## 2024-03-31 DIAGNOSIS — I480 Paroxysmal atrial fibrillation: Secondary | ICD-10-CM

## 2024-03-31 DIAGNOSIS — N183 Chronic kidney disease, stage 3 unspecified: Secondary | ICD-10-CM

## 2024-03-31 DIAGNOSIS — I10 Essential (primary) hypertension: Secondary | ICD-10-CM

## 2024-03-31 DIAGNOSIS — D6869 Other thrombophilia: Secondary | ICD-10-CM | POA: Diagnosis not present

## 2024-03-31 DIAGNOSIS — M79671 Pain in right foot: Secondary | ICD-10-CM

## 2024-03-31 DIAGNOSIS — R251 Tremor, unspecified: Secondary | ICD-10-CM | POA: Diagnosis not present

## 2024-03-31 DIAGNOSIS — I422 Other hypertrophic cardiomyopathy: Secondary | ICD-10-CM | POA: Diagnosis not present

## 2024-03-31 MED ORDER — VALACYCLOVIR HCL 500 MG PO TABS: 500.0000 mg | ORAL_TABLET | Freq: Every day | ORAL | 1 refills | Status: AC

## 2024-03-31 NOTE — Assessment & Plan Note (Signed)
 Continue carvedilol and Eliquis

## 2024-03-31 NOTE — Assessment & Plan Note (Signed)
 Recently had a period of extensive walking in [facility] about a month ago and having right sided pain at the ball of his foot.  Wears Brooks shoes that help.  Has been fitted for custom orthotics in the past that did not help.  Advised him to follow-up with the orthopedist/podiatrist who has evaluated for this in the past

## 2024-03-31 NOTE — Assessment & Plan Note (Signed)
Follows with cardiology  Continue current management

## 2024-03-31 NOTE — Assessment & Plan Note (Signed)
 Repeating kidney function testing.  Has been stable recently.

## 2024-03-31 NOTE — Assessment & Plan Note (Signed)
 Taking Eliquis  but only once a day due to his concerns with difficulty stopping bleeding episodes.  We discussed balancing the risk of bleeding versus stroke.  Has not had any blood in bowel movements or dark tarry stools.

## 2024-03-31 NOTE — Patient Instructions (Signed)
 It was nice to see you today,  We addressed the following topics today: -I would recommend scheduling appointment with your orthopedist who has evaluated your foot injuries in the past if your pain does not improve over the next few weeks. - I sent in the Valtrex  daily for 90 days with a refill - I have sent in an order for a kidney function test.  You can let the lab know when you get your other test that you would also like this test ordered.  Have a great day,  Etha Henle, MD

## 2024-03-31 NOTE — Assessment & Plan Note (Signed)
 Still ears consistent with essential tremor but he endorses that symptoms are better than the last time.  Has an appointment with neurology upcoming and he wishes to keep this appointment.

## 2024-03-31 NOTE — Assessment & Plan Note (Signed)
 Sending in refill as daily suppressive therapy 500 mg Valtrex .

## 2024-03-31 NOTE — Progress Notes (Signed)
 Established Patient Office Visit  Subjective   Patient ID: Brandon Gonzales, male    DOB: Jul 09, 1951  Age: 73 y.o. MRN: [id number]  Chief Complaint  Patient presents with   Medical Management of Chronic Issues    HPI  Subjective: - Requesting Valtrex  prescription renewal  - Reports upcoming neurology appointment on 04/17/2024 - Reports minor hand tremor when attempting fine motor tasks (putting on pants, pressing fingers together) - Reports tremor has improved since last visit when unable to hold razor still while shaving  - Taking Eliquis  once daily instead of prescribed twice daily due to excessive bleeding with cuts - Reports episode of prolonged bleeding after shaving with blood on pillow 24 hours later  - Requests 90-day supply of Valtrex  for daily use due to recent outbreak  - Reports foot pain, worse in right foot after walking - Reports history of plantar fasciitis - Reports fall from ladder approximately 1.5 years ago with bilateral ankle sprains and foot bruising - Reports walking 25 miles in 2 days during recent [place] trip in early April - Previously tried custom orthotics ($400/set) without significant benefit - Currently using [employer] with arch support which helps - Previously saw podiatrist who prescribed topical medication with minimal benefit   The ASCVD Risk score (Arnett DK, et al., 2019) failed to calculate for the following reasons:   Risk score cannot be calculated because patient has a medical history suggesting prior/existing ASCVD  Health Maintenance Due  Topic Date Due   DTaP/Tdap/Td (1 - Tdap) Never done   Pneumonia Vaccine 73+ Years old (1 of 2 - PCV) Never done   Zoster Vaccines- Shingrix (1 of 2) Never done   Medicare Annual Wellness (AWV)  11/24/2021   COVID-19 Vaccine (1 - 2024-25 season) Never done      Objective:     BP 137/85   Pulse 61   Ht 5\' 8"  (1.727 m)   Wt 176 lb (79.8 kg)   SpO2 97%   BMI 26.76 kg/m    Physical  Exam General: Alert, oriented Pulmonary: No respiratory stress Psych: Pleasant affect   No results found for any visits on 03/31/24.      Assessment & Plan:   Tremor Assessment & Plan: Still ears consistent with essential tremor but he endorses that symptoms are better than the last time.  Has an appointment with neurology upcoming and he wishes to keep this appointment.   Herpes labialis Assessment & Plan: Sending in refill as daily suppressive therapy 500 mg Valtrex .  Orders: -     valACYclovir  HCl; Take 1 tablet (500 mg total) by mouth daily.  Dispense: 90 tablet; Refill: 1  Essential hypertension -     Comprehensive metabolic panel with GFR; Future  Stage 3 chronic kidney disease, unspecified whether stage 3a or 3b CKD (HCC) Assessment & Plan: Repeating kidney function testing.  Has been stable recently.   Hypercoagulable state due to paroxysmal atrial fibrillation [facility]) Assessment & Plan: Taking Eliquis  but only once a day due to his concerns with difficulty stopping bleeding episodes.  We discussed balancing the risk of bleeding versus stroke.  Has not had any blood in bowel movements or dark tarry stools.   Paroxysmal atrial fibrillation (HCC) Assessment & Plan: Continue carvedilol  and Eliquis    Hypertrophic cardiomyopathy (HCC) Assessment & Plan: Follows with cardiology.  Continue current management.   Right foot pain Assessment & Plan: Recently had a period of extensive walking in [place] [facility] about a  month ago and having right sided pain at the ball of his foot.  Wears Brooks shoes that help.  Has been fitted for custom orthotics in the past that did not help.  Advised him to follow-up with the orthopedist/podiatrist who has evaluated for this in the past      Return in about 6 months (around 09/30/2024) for hld, HTN.    Laneta Pintos, MD

## 2024-04-17 ENCOUNTER — Telehealth: Payer: Self-pay | Admitting: Neurology

## 2024-04-17 ENCOUNTER — Ambulatory Visit: Payer: Medicare Other | Admitting: Neurology

## 2024-04-17 NOTE — Telephone Encounter (Signed)
Mychart provider out

## 2024-04-17 NOTE — Telephone Encounter (Signed)
 Pt called in regards to MyChart message . Pt states he waiting 3 months for the appoint me he had and would like to be seen sooner . Pt is frustrated and unhappy . Informed pt I will add him on wait list ,however he want an earlier appt.

## 2024-04-24 ENCOUNTER — Ambulatory Visit: Admitting: Neurology

## 2024-04-24 ENCOUNTER — Encounter: Payer: Self-pay | Admitting: Neurology

## 2024-04-24 VITALS — BP 120/77 | HR 58 | Ht 68.5 in | Wt 176.0 lb

## 2024-04-24 DIAGNOSIS — R251 Tremor, unspecified: Secondary | ICD-10-CM | POA: Diagnosis not present

## 2024-04-24 NOTE — Progress Notes (Signed)
 Chief Complaint  Patient presents with   New Patient (Initial Visit)    Rm15, wife present, internal referral for Tremor: bilateral hands & fingers which effects dexterity especially when shaving face (ongoing since January).       ASSESSMENT AND PLAN  Brandon Gonzales is a 73 y.o. male   Tremor  No parkinsonian features  Will have thyroid  functional test by his cardiologist  Continue observe only return to clinic for new issues  DIAGNOSTIC DATA (LABS, IMAGING, TESTING) - I reviewed patient records, labs, notes, testing and imaging myself where available.   MEDICAL HISTORY:  Brandon Gonzales, is a 73 year old male, accompanied by his wife seen in request by his primary care doctor Laneta Pintos, for evaluation of bilateral hands tremor, wants to rule out Parkinson's disease, initial evaluation was on Apr 24, 2024  History is obtained from the patient and review of electronic medical records. I personally reviewed pertinent available imaging films in PACS.   PMHx of  HLD HTN CAD, s/p stent S/p Pacemaker Stroke Hypertrophic Cardiomyopathy A fib,  He is a retired IT sales professional, still work on Anim[employer] sometimes, since January 2025, he noticed difficulty using his hand, involving both hands, difficulty typing, in the morning, sometimes he has difficulty lying his finger together, is noticeable when he shaved, is hard for him to hold his razor steady  He denies difficulty using utensil, denies trouble walking, recent left inner thigh muscle injury, his left leg pain  He denied loss sense of smell, no REM sleep disorder, no family history of tremor  PHYSICAL EXAM:   Vitals:   04/24/24 1106  BP: 120/77  Pulse: (!) 58  Weight: 176 lb (79.8 kg)  Height: 5' 8.5" (1.74 m)      Body mass index is 26.37 kg/m.  PHYSICAL EXAMNIATION:  Gen: NAD, conversant, well nourised, well groomed                     Cardiovascular: Regular rate rhythm, no peripheral edema, warm,  nontender. Eyes: Conjunctivae clear without exudates or hemorrhage Neck: Supple, no carotid bruits. Pulmonary: Clear to auscultation bilaterally   NEUROLOGICAL EXAM:  MENTAL STATUS: Speech/cognition: Awake, alert, oriented to history taking and casual conversation CRANIAL NERVES: CN II: Visual fields are full to confrontation. Pupils are round equal and briskly reactive to light. CN III, IV, VI: extraocular movement are normal. No ptosis. CN V: Facial sensation is intact to light touch CN VII: Face is symmetric with normal eye closure  CN VIII: Hearing is normal to causal conversation. CN IX, X: Phonation is normal. CN XI: Head turning and shoulder shrug are intact  MOTOR:  Slight difficulty drawing spiral circle, normal strength, no rigidity no bradykinesia  REFLEXES: Reflexes are 1 and symmetric at the biceps, triceps, knees, and ankles. Plantar responses are flexor.  SENSORY: Intact to light touch, pinprick and vibratory sensation are intact in fingers and toes.  COORDINATION: There is no trunk or limb dysmetria noted.  GAIT/STANCE: Mild antalgic, moderate stride,  REVIEW OF SYSTEMS:  Full 14 system review of systems performed and notable only for as above All other review of systems were negative.   ALLERGIES: Allergies  Allergen Reactions   Antihistamines, Chlorpheniramine-Type Other (See Comments)    Altered mental status   Pheniramine Other (See Comments)    Altered mental status   Statins Other (See Comments)    Leg cramps     Zetia [Ezetimibe] Other (See Comments)  Leg cramps    HOME MEDICATIONS: Current Outpatient Medications  Medication Sig Dispense Refill   amLODipine  (NORVASC ) 2.5 MG tablet TAKE 1 TABLET BY MOUTH EVERY DAY 90 tablet 2   apixaban  (ELIQUIS ) 5 MG TABS tablet Take 1 tablet (5 mg total) by mouth 2 (two) times daily. (Patient taking differently: Take 5 mg by mouth daily.) 180 tablet 3   carvedilol  (COREG ) 12.5 MG tablet TAKE 0.5 TABLETS  (6.25 MG TOTAL) BY MOUTH 2 (TWO) TIMES DAILY. 90 tablet 2   clopidogrel  (PLAVIX ) 75 MG tablet TAKE 1 TABLET BY MOUTH EVERY DAY 90 tablet 3   esomeprazole (NEXIUM) 20 MG capsule Take 20 mg by mouth every morning.     Evolocumab  (REPATHA  SURECLICK) 140 MG/ML SOAJ INJECT 140 MG INTO THE SKIN EVERY 14 (FOURTEEN) DAYS. 6 mL 3   lisinopril  (ZESTRIL ) 40 MG tablet TAKE 1 TABLET BY MOUTH EVERY DAY 90 tablet 3   nitroGLYCERIN  (NITROSTAT ) 0.4 MG SL tablet Place 1 tablet (0.4 mg total) under the tongue every 5 (five) minutes as needed for chest pain. 25 tablet 2   valACYclovir  (VALTREX ) 500 MG tablet Take 1 tablet (500 mg total) by mouth daily. 90 tablet 1   No current facility-administered medications for this visit.    PAST MEDICAL HISTORY: Past Medical History:  Diagnosis Date   Arthritis    Cancer (HCC)    SKIN CA  SQUAMOUS CELL   Coronary artery disease    a. 2007 Stent to LAD, PTCA D1;  b. DES to LAD & LCx 10/13 in the setting of STEMI.   Dyslipidemia    a. Statin and zetia-intolerant. On niacin .   GERD (gastroesophageal reflux disease)    Heart murmur    Hyperlipidemia    Phreesia 11/22/2020   Hypertension    Hypertrophic cardiomyopathy (HCC)    a. 09/2012 Echo: EF 55%, mid to apical anterolateral, posterior, apical anterior, apical HK, Gr1 DD, turbulence across LVOT suggesting degree of obstruction, mild MR, mildly dil LA.   Myocardial infarction (HCC)    Phreesia 11/22/2020   Stroke [facility])    Phreesia 11/22/2020    PAST SURGICAL HISTORY: Past Surgical History:  Procedure Laterality Date   CARDIAC CATHETERIZATION  2007   stent to LAD and PTCA of diagonal branch   CARDIAC CATHETERIZATION  2010   CORONARY ANGIOPLASTY WITH STENT PLACEMENT  2013   95-99% prox LAD, patent LAD stent distal to this, occluded mid-dist LCx, mild <10% RCA irreg; s/p DES-prox LAD & DES to mid-dist LCx   CORONARY BALLOON ANGIOPLASTY N/A 05/16/2017   Procedure: Coronary Balloon Angioplasty;  Surgeon: Arleen Lacer, MD;  Location: [facility] INVASIVE CV LAB;  Service: Cardiovascular;  Laterality: N/A;   ELBOW SURGERY  1996   right   ICD IMPLANT N/A 03/10/2021   Procedure: ICD IMPLANT;  Surgeon: Tammie Fall, MD;  Location: [facility] INVASIVE CV LAB;  Service: Cardiovascular;  Laterality: N/A;   KNEE SURGERY  1993   left   LEFT HEART CATH Bilateral 09/22/2012   Procedure: LEFT HEART CATH;  Surgeon: Peter M Swaziland, MD;  Location: [facility] CATH LAB;  Service: Cardiovascular;  Laterality: Bilateral;   LEFT HEART CATH AND CORONARY ANGIOGRAPHY N/A 05/16/2017   Procedure: Left Heart Cath and Coronary Angiography;  Surgeon: Arleen Lacer, MD;  Location: [facility] INVASIVE CV LAB;  Service: Cardiovascular;  Laterality: N/A;   PERCUTANEOUS CORONARY STENT INTERVENTION (PCI-S) Right 09/22/2012   Procedure: PERCUTANEOUS CORONARY STENT INTERVENTION (PCI-S);  Surgeon: Peter M Swaziland,  MD;  Location: [facility] CATH LAB;  Service: Cardiovascular;  Laterality: Right;    FAMILY HISTORY: Family History  Problem Relation Age of Onset   Heart attack Mother 70   Heart disease Mother    AAA (abdominal aortic aneurysm) Father    Heart disease Father     SOCIAL HISTORY: Social History   Socioeconomic History   Marital status: Married    Spouse name: Jennye Mola   Number of children: Not on file   Years of education: high school    Highest education level: Associate degree: academic program  Occupational History   Occupation: retired IT sales professional   Tobacco Use   Smoking status: Never    Passive exposure: Never   Smokeless tobacco: Never  Vaping Use   Vaping status: Never Used  Substance and Sexual Activity   Alcohol use: No   Drug use: No   Sexual activity: Not Currently    Partners: Female    Comment: MARRIED  Other Topics Concern   Not on file  Social History Narrative   Retired IT sales professional   Social Drivers of Corporate investment banker Strain: Low Risk  (12/21/2023)   Overall Financial Resource Strain (CARDIA)    Difficulty of  Paying Living Expenses: Not hard at all  Food Insecurity: No Food Insecurity (12/21/2023)   Hunger Vital Sign    Worried About Running Out of Food in the Last Year: Never true    Ran Out of Food in the Last Year: Never true  Transportation Needs: No Transportation Needs (12/21/2023)   PRAPARE - Administrator, Civil Service (Medical): No    Lack of Transportation (Non-Medical): No  Physical Activity: Unknown (12/21/2023)   Exercise Vital Sign    Days of Exercise per Week: 0 days    Minutes of Exercise per Session: Not on file  Stress: No Stress Concern Present (12/21/2023)   [employer] of Occupational Health - Occupational Stress Questionnaire    Feeling of Stress : Only a little  Social Connections: Socially Integrated (12/21/2023)   Social Connection and Isolation Panel [NHANES]    Frequency of Communication with Friends and Family: More than three times a week    Frequency of Social Gatherings with Friends and Family: Once a week    Attends Religious Services: More than 4 times per year    Active Member of [employer] or Organizations: Yes    Attends Engineer, structural: More than 4 times per year    Marital Status: Married  Catering manager Violence: Not on file      Phebe Brasil, M.D. Ph.D.  [facility] [address], [address] [place], [place] [address] Ph: [phone] Fax: [phone]  CC:  Laneta Pintos, MD [address] [place],  [place] [address]  Laneta Pintos, MD

## 2024-04-29 NOTE — Addendum Note (Signed)
 Addended by: Edra Govern D on: 04/29/2024 11:49 AM   Modules accepted: Orders

## 2024-04-29 NOTE — Progress Notes (Signed)
 Remote ICD transmission.

## 2024-05-08 ENCOUNTER — Other Ambulatory Visit: Payer: Self-pay

## 2024-05-08 DIAGNOSIS — L821 Other seborrheic keratosis: Secondary | ICD-10-CM | POA: Diagnosis not present

## 2024-05-08 DIAGNOSIS — Z85828 Personal history of other malignant neoplasm of skin: Secondary | ICD-10-CM | POA: Diagnosis not present

## 2024-05-08 DIAGNOSIS — E785 Hyperlipidemia, unspecified: Secondary | ICD-10-CM | POA: Diagnosis not present

## 2024-05-08 DIAGNOSIS — L812 Freckles: Secondary | ICD-10-CM | POA: Diagnosis not present

## 2024-05-08 DIAGNOSIS — Z85820 Personal history of malignant melanoma of skin: Secondary | ICD-10-CM | POA: Diagnosis not present

## 2024-05-12 LAB — LIPID PANEL
Chol/HDL Ratio: 2.6 ratio (ref 0.0–5.0)
Cholesterol, Total: 99 mg/dL — ABNORMAL LOW (ref 100–199)
HDL: 38 mg/dL — ABNORMAL LOW (ref >39)
LDL Chol Calc (NIH): 44 mg/dL (ref 0–99)
Triglycerides: 81 mg/dL (ref 0–149)
VLDL Cholesterol Cal: 17 mg/dL (ref 5–40)

## 2024-05-13 ENCOUNTER — Ambulatory Visit: Payer: Self-pay | Admitting: *Deleted

## 2024-05-23 ENCOUNTER — Telehealth: Payer: Self-pay | Admitting: Cardiology

## 2024-05-23 DIAGNOSIS — I480 Paroxysmal atrial fibrillation: Secondary | ICD-10-CM

## 2024-05-23 MED ORDER — APIXABAN 5 MG PO TABS: 5.0000 mg | ORAL_TABLET | Freq: Two times a day (BID) | ORAL | 1 refills | Status: DC

## 2024-05-23 NOTE — Telephone Encounter (Signed)
 Pt would like a c/b in regards to his blood work results. Please advise

## 2024-05-23 NOTE — Telephone Encounter (Signed)
 Prescription refill request for Eliquis  received. Indication: a fib Last office visit: 04/24/24 Scr: 1.37 epic 08/09/23 Age: 73 Weight: 79kg

## 2024-05-23 NOTE — Telephone Encounter (Signed)
*  STAT* If patient is at the pharmacy, call can be transferred to refill team.   1. Which medications need to be refilled? (please list name of each medication and dose if known) Eliquis    2. Would you like to learn more about the convenience, safety, & potential cost savings by using the [facility]?    3. Are you open to using the [facility] (Type [facility].    4. Which pharmacy/location (including street and city if local pharmacy) is medication to be sent to?[facility] [address] [place]   5. Do they need a 30 day or 90 day supply? 30 days and refills- please call today- out of medicine

## 2024-05-23 NOTE — Telephone Encounter (Signed)
 Spoke with patient and he is aware of lipid results. He verbalized understanding

## 2024-06-02 DIAGNOSIS — H26492 Other secondary cataract, left eye: Secondary | ICD-10-CM | POA: Diagnosis not present

## 2024-06-02 DIAGNOSIS — H43392 Other vitreous opacities, left eye: Secondary | ICD-10-CM | POA: Diagnosis not present

## 2024-06-02 DIAGNOSIS — Z961 Presence of intraocular lens: Secondary | ICD-10-CM | POA: Diagnosis not present

## 2024-06-02 DIAGNOSIS — H0102A Squamous blepharitis right eye, upper and lower eyelids: Secondary | ICD-10-CM | POA: Diagnosis not present

## 2024-06-03 ENCOUNTER — Ambulatory Visit: Admitting: Neurology

## 2024-06-09 ENCOUNTER — Ambulatory Visit (INDEPENDENT_AMBULATORY_CARE_PROVIDER_SITE_OTHER): Payer: Medicare HMO

## 2024-06-09 DIAGNOSIS — I495 Sick sinus syndrome: Secondary | ICD-10-CM | POA: Diagnosis not present

## 2024-06-11 LAB — CUP PACEART REMOTE DEVICE CHECK
Battery Remaining Longevity: 67 mo
Battery Remaining Percentage: 66 %
Battery Voltage: 2.99 V
Brady Statistic AP VP Percent: 1 %
Brady Statistic AP VS Percent: 54 %
Brady Statistic AS VP Percent: 1 %
Brady Statistic AS VS Percent: 44 %
Brady Statistic RA Percent Paced: 51 %
Brady Statistic RV Percent Paced: 1 %
Date Time Interrogation Session: 20250707020244
HighPow Impedance: 70 Ohm
Implantable Lead Connection Status: 753985
Implantable Lead Connection Status: 753985
Implantable Lead Implant Date: 20220407
Implantable Lead Implant Date: 20220407
Implantable Lead Location: 753859
Implantable Lead Location: 753860
Implantable Lead Model: 7122
Implantable Pulse Generator Implant Date: 20220407
Lead Channel Impedance Value: 380 Ohm
Lead Channel Impedance Value: 460 Ohm
Lead Channel Pacing Threshold Amplitude: 0.5 V
Lead Channel Pacing Threshold Amplitude: 0.75 V
Lead Channel Pacing Threshold Pulse Width: 0.5 ms
Lead Channel Pacing Threshold Pulse Width: 0.5 ms
Lead Channel Sensing Intrinsic Amplitude: 0.7 mV
Lead Channel Sensing Intrinsic Amplitude: 11.8 mV
Lead Channel Setting Pacing Amplitude: 2 V
Lead Channel Setting Pacing Amplitude: 2.5 V
Lead Channel Setting Pacing Pulse Width: 0.5 ms
Lead Channel Setting Sensing Sensitivity: 0.5 mV
Pulse Gen Serial Number: 810023633
Zone Setting Status: 755011

## 2024-06-12 ENCOUNTER — Ambulatory Visit: Payer: Self-pay | Admitting: Internal Medicine

## 2024-08-14 ENCOUNTER — Encounter: Payer: Self-pay | Admitting: Cardiology

## 2024-08-15 ENCOUNTER — Telehealth: Payer: Self-pay | Admitting: Internal Medicine

## 2024-08-15 NOTE — Telephone Encounter (Signed)
 Patient wants his visit with Dr. Mona on 11/11 to be a virtual visit.

## 2024-08-15 NOTE — Telephone Encounter (Signed)
 I spoke with patient and let him know Dr Mona no longer does virtual visit. Patient will come in for in patient visit as scheduled.

## 2024-08-15 NOTE — Telephone Encounter (Signed)
 I spoke with patient. He has appointment with Dr Mona on 11/12 and is asking if it can be a virtual visit.  Patient reports previous appointments with Dr Mona have been virtual.

## 2024-09-08 ENCOUNTER — Ambulatory Visit: Payer: Medicare HMO

## 2024-09-08 DIAGNOSIS — I480 Paroxysmal atrial fibrillation: Secondary | ICD-10-CM

## 2024-09-09 LAB — CUP PACEART REMOTE DEVICE CHECK
Battery Remaining Longevity: 64 mo
Battery Remaining Percentage: 64 %
Battery Voltage: 2.99 V
Brady Statistic AP VP Percent: 1 %
Brady Statistic AP VS Percent: 54 %
Brady Statistic AS VP Percent: 1 %
Brady Statistic AS VS Percent: 44 %
Brady Statistic RA Percent Paced: 51 %
Brady Statistic RV Percent Paced: 1 %
Date Time Interrogation Session: 20251006020535
HighPow Impedance: 70 Ohm
Implantable Lead Connection Status: 753985
Implantable Lead Connection Status: 753985
Implantable Lead Implant Date: 20220407
Implantable Lead Implant Date: 20220407
Implantable Lead Location: 753859
Implantable Lead Location: 753860
Implantable Lead Model: 7122
Implantable Pulse Generator Implant Date: 20220407
Lead Channel Impedance Value: 350 Ohm
Lead Channel Impedance Value: 430 Ohm
Lead Channel Pacing Threshold Amplitude: 0.5 V
Lead Channel Pacing Threshold Amplitude: 0.75 V
Lead Channel Pacing Threshold Pulse Width: 0.5 ms
Lead Channel Pacing Threshold Pulse Width: 0.5 ms
Lead Channel Sensing Intrinsic Amplitude: 0.9 mV
Lead Channel Sensing Intrinsic Amplitude: 11.8 mV
Lead Channel Setting Pacing Amplitude: 2 V
Lead Channel Setting Pacing Amplitude: 2.5 V
Lead Channel Setting Pacing Pulse Width: 0.5 ms
Lead Channel Setting Sensing Sensitivity: 0.5 mV
Pulse Gen Serial Number: 810023633
Zone Setting Status: 755011

## 2024-09-09 NOTE — Progress Notes (Signed)
 Remote ICD Transmission

## 2024-09-11 ENCOUNTER — Ambulatory Visit: Payer: Self-pay | Admitting: Internal Medicine

## 2024-09-11 ENCOUNTER — Ambulatory Visit

## 2024-09-11 VITALS — BP 134/77 | HR 61 | Ht 68.5 in | Wt 175.5 lb

## 2024-09-11 DIAGNOSIS — R42 Dizziness and giddiness: Secondary | ICD-10-CM | POA: Diagnosis not present

## 2024-09-11 NOTE — Patient Instructions (Addendum)
 VISIT SUMMARY: You visited us  today due to worsening vertigo symptoms. We discussed your vertigo, nausea, and facial numbness, and reviewed your current medications and medical history.  YOUR PLAN: VERTIGO: Your vertigo has been present for several years and is currently at its worst. It is triggered by quick head movements and is not responsive to antihistamines or dramamine. -We will refer you to Dr. Carlie at [facility] for vestibular physical therapy and the Epley maneuver. -Please contact Dr. Carlie' office tomorrow to schedule an appointment as soon as possible. -If your nausea becomes intolerable, we can offer anti-nausea medication or patches.  HYPERTENSION: Your high blood pressure is currently managed with lisinopril , carvedilol , and amlodipine . -Continue taking your current medications as prescribed.  If you have any problems before your next visit feel free to message me via MyChart (minor issues or questions) or call the office, otherwise you may reach out to schedule an office visit.  Thank you! Saddie Sacks, PA-C

## 2024-09-11 NOTE — Progress Notes (Signed)
 Established Patient Office Visit  Subjective   Patient ID: Brandon Gonzales, male    DOB: [birth year]  Age: 73 y.o. MRN: [id number]  Chief Complaint  Patient presents with   Dizziness    HPI  History of Present Illness   Brandon Gonzales is a 73 year old male with vertigo who presents with worsening vertigo symptoms.  Vertigo - Vertigo present for several years, with current episode being the most severe and longest-lasting - Symptoms triggered by quick head movements, including looking up, bending over, or turning head quickly to the left or right - Manages symptoms by moving slowly - No current use of medication for vertigo due to adverse reactions to antihistamines and dramamine, which worsen symptoms - Vestibular therapy at with ENT specialist Dr. Carlie at [facility] involved maneuvers to reposition inner ear crystals; therapy initially worsens symptoms but leads to improvement after a few days.  Patient reports that he called Dr. Ilona office to schedule an appointment for vestibular therapy/Epley maneuver, however they informed him that he would need a new referral since he is no longer considered a patient there. - Patient reports that his main reason for the visit today is for this new referral.   Nausea - Nausea occurs when vertigo is severe - No current nausea - Manages nausea by lying down and being still -Declines prescription for nausea medication at this time.   Relevant medical history and medications - History of stroke - Pacemaker in place - Current medications include lisinopril , Nexium, Plavix , carvedilol , Eliquis , amlodipine , and Repatha  injections - Takes six pills daily plus injection          ROS Per HPI.    Objective:     BP 134/77   Pulse 61   Ht 5' 8.5 (1.74 m)   Wt 175 lb 8 oz (79.6 kg)   SpO2 100%   BMI 26.30 kg/m    Physical Exam Constitutional:      General: He is not in acute distress.    Appearance: Normal appearance.   Eyes:     Extraocular Movements:     Right eye: Nystagmus present.     Left eye: Nystagmus present.     Comments: Horizontal nystagmus  Cardiovascular:     Rate and Rhythm: Normal rate and regular rhythm.     Heart sounds: Normal heart sounds. No murmur heard.    No friction rub. No gallop.  Pulmonary:     Effort: Pulmonary effort is normal. No respiratory distress.     Breath sounds: Normal breath sounds.  Musculoskeletal:        General: No swelling.  Skin:    General: Skin is warm and dry.  Neurological:     General: No focal deficit present.     Mental Status: He is alert.     Cranial Nerves: Cranial nerves 2-12 are intact. No cranial nerve deficit or facial asymmetry.     Sensory: Sensation is intact. No sensory deficit.     Motor: Motor function is intact.     Coordination: Coordination is intact.     Gait: Gait is intact.  Psychiatric:        Mood and Affect: Mood normal.        Behavior: Behavior normal.        Thought Content: Thought content normal.      No results found for any visits on 09/11/24.    The ASCVD Risk score (Arnett DK, et al., 2019) failed  to calculate for the following reasons:   Risk score cannot be calculated because patient has a medical history suggesting prior/existing ASCVD    Assessment & Plan:   Vertigo -     Ambulatory referral to ENT  Chronic vertigo exacerbated, unresponsive to antihistamines or dramamine. Previous improvement with Epley maneuver by Dr. Carlie. Prefers vestibular therapy over medication. - Refer to Dr. Carlie at [facility] for vestibular physical therapy and Epley maneuver. - Advise contacting Dr. Carlie' office tomorrow to expedite appointment scheduling. -Discussed referral to [facility] for continued vestibular therapy if Dr. Carlie office cannot see him. - Offer anti-nausea medication if symptoms become intolerable, with option of nausea patches.  Return if symptoms worsen or fail to improve.     Saddie JULIANNA Sacks, PA-C

## 2024-09-11 NOTE — Assessment & Plan Note (Signed)
 Chronic vertigo exacerbated, unresponsive to antihistamines or dramamine. Previous improvement with Epley maneuver by Dr. Carlie. Prefers vestibular therapy over medication. - Refer to Dr. Carlie at [facility] for vestibular physical therapy and Epley maneuver. - Advise contacting Dr. Carlie' office tomorrow to expedite appointment scheduling. -Discussed referral to [facility] for continued vestibular therapy if Dr. Carlie office cannot see him. - Offer anti-nausea medication if symptoms become intolerable, with option of nausea patches.

## 2024-09-15 NOTE — Progress Notes (Signed)
 Remote ICD Transmission

## 2024-09-18 DIAGNOSIS — H8111 Benign paroxysmal vertigo, right ear: Secondary | ICD-10-CM | POA: Diagnosis not present

## 2024-09-30 ENCOUNTER — Encounter: Payer: Self-pay | Admitting: Family Medicine

## 2024-09-30 ENCOUNTER — Ambulatory Visit (INDEPENDENT_AMBULATORY_CARE_PROVIDER_SITE_OTHER): Admitting: Family Medicine

## 2024-09-30 VITALS — BP 121/75 | HR 78 | Ht 68.5 in | Wt 175.4 lb

## 2024-09-30 DIAGNOSIS — H8111 Benign paroxysmal vertigo, right ear: Secondary | ICD-10-CM | POA: Diagnosis not present

## 2024-09-30 DIAGNOSIS — E785 Hyperlipidemia, unspecified: Secondary | ICD-10-CM | POA: Diagnosis not present

## 2024-09-30 DIAGNOSIS — Z0000 Encounter for general adult medical examination without abnormal findings: Secondary | ICD-10-CM | POA: Diagnosis not present

## 2024-09-30 DIAGNOSIS — R399 Unspecified symptoms and signs involving the genitourinary system: Secondary | ICD-10-CM | POA: Insufficient documentation

## 2024-09-30 DIAGNOSIS — E782 Mixed hyperlipidemia: Secondary | ICD-10-CM

## 2024-09-30 DIAGNOSIS — H8110 Benign paroxysmal vertigo, unspecified ear: Secondary | ICD-10-CM

## 2024-09-30 DIAGNOSIS — Z131 Encounter for screening for diabetes mellitus: Secondary | ICD-10-CM | POA: Diagnosis not present

## 2024-09-30 DIAGNOSIS — B001 Herpesviral vesicular dermatitis: Secondary | ICD-10-CM | POA: Diagnosis not present

## 2024-09-30 DIAGNOSIS — R251 Tremor, unspecified: Secondary | ICD-10-CM

## 2024-09-30 DIAGNOSIS — Z125 Encounter for screening for malignant neoplasm of prostate: Secondary | ICD-10-CM | POA: Diagnosis not present

## 2024-09-30 DIAGNOSIS — I10 Essential (primary) hypertension: Secondary | ICD-10-CM | POA: Diagnosis not present

## 2024-09-30 DIAGNOSIS — N183 Chronic kidney disease, stage 3 unspecified: Secondary | ICD-10-CM

## 2024-09-30 MED ORDER — TAMSULOSIN HCL 0.4 MG PO CAPS: 0.4000 mg | ORAL_CAPSULE | Freq: Every day | ORAL | 3 refills | Status: DC

## 2024-09-30 NOTE — Assessment & Plan Note (Signed)
-   Followed by cardiology (Dr. Mona). Last LDL was 44, which is at goal. On Repatha . - Will defer lipid panel today as he is scheduled for labs with cardiology soon.

## 2024-09-30 NOTE — Assessment & Plan Note (Signed)
-   Due for tetanus/diphtheria/pertussis (Tdap), pneumococcal (Prevnar), and shingles (Shingrix) vaccinations. - Advised he can receive these from his pharmacist. - Plan for annual physical next year.

## 2024-09-30 NOTE — Assessment & Plan Note (Signed)
-   Reports episodic, positional vertigo for several years, acutely worse over the past 5 weeks. Symptoms are triggered by bending over, looking up, or rapid head movements. Denies associated ear pain, nausea, or hearing changes, making Meniere's disease less likely. Has an appointment with ENT today. - Continue with scheduled ENT appointment today. - If symptoms persist after ENT evaluation, will provide a referral to [facility] for further evaluation and management of BPPV with vestibular therapy.

## 2024-09-30 NOTE — Progress Notes (Signed)
 Established Patient Office Visit  Subjective   Patient ID: Brandon Gonzales, male    DOB: 18-Dec-1950  Age: 73 y.o. MRN: [id number]  Chief Complaint  Patient presents with   Medical Management of Chronic Issues    HPI  Subjective - Follow-up visit, treated as an annual physical. - Vertigo: Ongoing for approximately 5 weeks. Reports vertigo when bending over, looking up, or moving head side-to-side too quickly. Describes a spinning sensation. This has limited activities, including the ability to play golf for the last 2 years. Denies ear pain or hearing changes. Seeing ENT today for this issue. Previous evaluation by ENT suggested BPPV and maneuvers were performed in-office with blackout goggles. - Essential Tremor: Reports tremors are stable. - Herpes Simplex Virus Outbreak: Reports taking Valtrex  for an active outbreak, 2 tablets last night and 2 this morning. Reports 2-3 recurrences per year. - Benign Prostatic Hyperplasia (BPH) symptoms: Reports urinary hesitancy and intermittency. Reports nocturia. Denies family history of prostate cancer.  Medications Current medications include amlodipine , Eliquis , carvedilol , Plavix , Repatha , lisinopril , primidone, and Valtrex  as needed for HSV outbreaks. Has nitroglycerin  for as-needed use but has not required it recently. Also taking Valtrex  1g BID for current HSV outbreak.  PMH, PSH, FH, Social Hx PMHx: Hyperlipidemia (managed by Dr. Mona, cardiologist), essential tremor (managed by neurology), BPPV, history of HSV. PSH: None mentioned. FH: No family history of prostate cancer. Social Hx: Declines COVID vaccine. Receives annual influenza vaccine from a pharmacist.  ROS Constitutional: Denies acute issues or concerns. Neurological: Positive for vertigo and essential tremor. GU: Positive for urinary hesitancy, intermittency, and nocturia. Integumentary: Positive for recurrent HSV outbreaks. Denies other rashes or lesions.   The ASCVD Risk  score (Arnett DK, et al., 2019) failed to calculate for the following reasons:   Risk score cannot be calculated because patient has a medical history suggesting prior/existing ASCVD  Health Maintenance Due  Topic Date Due   Medicare Annual Wellness (AWV)  11/24/2021   COVID-19 Vaccine (1 - 2025-26 season) Never done      Objective:     BP 121/75   Pulse 78   Ht 5' 8.5 (1.74 m)   Wt 175 lb 6.4 oz (79.6 kg)   SpO2 96%   BMI 26.28 kg/m    Physical Exam Gen: alert, oriented HEENT: perrla, eomi, mmm CV: rrr, no murmur Pulm: lctab. No wheeze or crackles.  GI: soft, nbs.  Nontender to palpation MSK: strength equal b/l. Normal gait Ext: no pedal edema Skin: warm and dry, no rashes Psych: pleasant affect.  Spontaneous speech   No results found for any visits on 09/30/24.      Assessment & Plan:   Physical exam, annual  Mixed hyperlipidemia Assessment & Plan: - Followed by cardiology (Dr. Mona). Last LDL was 44, which is at goal. On Repatha . - Will defer lipid panel today as he is scheduled for labs with cardiology soon.  Orders: -     Hemoglobin A1c; Future  Screening for diabetes mellitus -     Hemoglobin A1c; Future  Herpes labialis Assessment & Plan: - Reports 2-3 outbreaks per year. Currently treating an active outbreak. - Discussed daily suppressive therapy with Valtrex  to reduce the frequency of recurrences. Advised to start taking one tablet daily after the current outbreak resolves.   Tremor Assessment & Plan: - Stable on current regimen of carvedilol . Neurologist previously discussed primidone as an option but did not start it due to potential side effects and dependence. - Continue  current management.   Lower urinary tract symptoms (LUTS) Assessment & Plan: - Presents with symptoms of urinary hesitancy, intermittency, and nocturia. No family history of prostate cancer. - Start Flomax (tamsulosin) once daily. - Lab work to be scheduled, to  include PSA, A1C, and liver function tests. - Follow up in 2 months to assess response to medication.   Benign paroxysmal positional vertigo, unspecified laterality Assessment & Plan: - Reports episodic, positional vertigo for several years, acutely worse over the past 5 weeks. Symptoms are triggered by bending over, looking up, or rapid head movements. Denies associated ear pain, nausea, or hearing changes, making Meniere's disease less likely. Has an appointment with ENT today. - Continue with scheduled ENT appointment today. - If symptoms persist after ENT evaluation, will provide a referral to [facility] for further evaluation and management of BPPV with vestibular therapy.   Prostate cancer screening -     PSA; Future  Healthcare maintenance Assessment & Plan: - Due for tetanus/diphtheria/pertussis (Tdap), pneumococcal (Prevnar), and shingles (Shingrix) vaccinations. - Advised he can receive these from his pharmacist. - Plan for annual physical next year.   Essential hypertension -     CBC with Differential/Platelet; Future  Stage 3 chronic kidney disease, unspecified whether stage 3a or 3b CKD (HCC) -     CBC with Differential/Platelet; Future -     Comprehensive metabolic panel with GFR; Future  Dyslipidemia  Other orders -     Tamsulosin HCl; Take 1 capsule (0.4 mg total) by mouth daily.  Dispense: 30 capsule; Refill: 3     Return in about 2 months (around 11/30/2024) for bph.    Toribio MARLA Slain, MD

## 2024-09-30 NOTE — Assessment & Plan Note (Signed)
-   Presents with symptoms of urinary hesitancy, intermittency, and nocturia. No family history of prostate cancer. - Start Flomax (tamsulosin) once daily. - Lab work to be scheduled, to include PSA, A1C, and liver function tests. - Follow up in 2 months to assess response to medication.

## 2024-09-30 NOTE — Assessment & Plan Note (Signed)
-   Reports 2-3 outbreaks per year. Currently treating an active outbreak. - Discussed daily suppressive therapy with Valtrex  to reduce the frequency of recurrences. Advised to start taking one tablet daily after the current outbreak resolves.

## 2024-09-30 NOTE — Assessment & Plan Note (Signed)
-   Stable on current regimen of carvedilol . Neurologist previously discussed primidone as an option but did not start it due to potential side effects and dependence. - Continue current management.

## 2024-09-30 NOTE — Patient Instructions (Signed)
 It was nice to see you today,  We addressed the following topics today: - I recommend you get your Tdap, pneumococcal, and Shingrix vaccinations. You can speak to your pharmacist about receiving these when you get your flu shot. - After your current outbreak resolves, I would like you to try taking one Valtrex  tablet daily to help prevent future outbreaks. - Please schedule an appointment for your lab work (PSA, A1C, liver function tests). - Be aware that the new medication, Flomax, can lower your blood pressure. - If your vertigo does not improve after your ENT visit, please let us  know so I can send a referral to a physical therapist ([facility] in [place]) - I would like you to return for a follow-up visit in two months to check on your prostate symptoms.  Have a great day,  Rolan Slain, MD

## 2024-10-06 ENCOUNTER — Other Ambulatory Visit

## 2024-10-06 DIAGNOSIS — N183 Chronic kidney disease, stage 3 unspecified: Secondary | ICD-10-CM

## 2024-10-06 DIAGNOSIS — Z131 Encounter for screening for diabetes mellitus: Secondary | ICD-10-CM

## 2024-10-06 DIAGNOSIS — Z125 Encounter for screening for malignant neoplasm of prostate: Secondary | ICD-10-CM | POA: Diagnosis not present

## 2024-10-06 DIAGNOSIS — I10 Essential (primary) hypertension: Secondary | ICD-10-CM | POA: Diagnosis not present

## 2024-10-06 DIAGNOSIS — E782 Mixed hyperlipidemia: Secondary | ICD-10-CM

## 2024-10-07 LAB — PSA: Prostate Specific Ag, Serum: 13.1 ng/mL — ABNORMAL HIGH (ref 0.0–4.0)

## 2024-10-07 LAB — CBC WITH DIFFERENTIAL/PLATELET
Basophils Absolute: 0 x10E3/uL (ref 0.0–0.2)
Basos: 0 %
EOS (ABSOLUTE): 0.1 x10E3/uL (ref 0.0–0.4)
Eos: 2 %
Hematocrit: 40.9 % (ref 37.5–51.0)
Hemoglobin: 13.7 g/dL (ref 13.0–17.7)
Immature Grans (Abs): 0 x10E3/uL (ref 0.0–0.1)
Immature Granulocytes: 0 %
Lymphocytes Absolute: 1 x10E3/uL (ref 0.7–3.1)
Lymphs: 20 %
MCH: 29.5 pg (ref 26.6–33.0)
MCHC: 33.5 g/dL (ref 31.5–35.7)
MCV: 88 fL (ref 79–97)
Monocytes Absolute: 0.5 x10E3/uL (ref 0.1–0.9)
Monocytes: 9 %
Neutrophils Absolute: 3.3 x10E3/uL (ref 1.4–7.0)
Neutrophils: 69 %
Platelets: 193 x10E3/uL (ref 150–450)
RBC: 4.65 x10E6/uL (ref 4.14–5.80)
RDW: 12.7 % (ref 11.6–15.4)
WBC: 4.9 x10E3/uL (ref 3.4–10.8)

## 2024-10-07 LAB — HEMOGLOBIN A1C
Est. average glucose Bld gHb Est-mCnc: 105 mg/dL
Hgb A1c MFr Bld: 5.3 % (ref 4.8–5.6)

## 2024-10-07 LAB — COMPREHENSIVE METABOLIC PANEL WITH GFR
ALT: 18 IU/L (ref 0–44)
AST: 28 IU/L (ref 0–40)
Albumin: 4.4 g/dL (ref 3.8–4.8)
Alkaline Phosphatase: 53 IU/L (ref 47–123)
BUN/Creatinine Ratio: 14 (ref 10–24)
BUN: 17 mg/dL (ref 8–27)
Bilirubin Total: 1.5 mg/dL — ABNORMAL HIGH (ref 0.0–1.2)
CO2: 21 mmol/L (ref 20–29)
Calcium: 9.4 mg/dL (ref 8.6–10.2)
Chloride: 103 mmol/L (ref 96–106)
Creatinine, Ser: 1.25 mg/dL (ref 0.76–1.27)
Globulin, Total: 1.8 g/dL (ref 1.5–4.5)
Glucose: 100 mg/dL — ABNORMAL HIGH (ref 70–99)
Potassium: 4.5 mmol/L (ref 3.5–5.2)
Sodium: 139 mmol/L (ref 134–144)
Total Protein: 6.2 g/dL (ref 6.0–8.5)
eGFR: 60 mL/min/1.73 (ref >59)

## 2024-10-08 NOTE — Progress Notes (Signed)
 Cardiology Office Note   Date:  10/14/2024   ID:  Brandon Gonzales, DOB 10-08-1951, MRN [id number]  PCP:  Chandra Toribio POUR, MD  Cardiologist:   Emberlie Gotcher, MD   Chief Complaint  Patient presents with   Atrial Fibrillation   Coronary Artery Disease        History of Present Illness: Brandon Gonzales is a 73 y.o. male who presents for follow up CAD and HCM. He has a PMH including CAD prior mid LAD stenting in 2007 with  PTCA to D1, followed by lateral STEMI and PCI/DES to the midLCX & proximalLAD in 2013. S/p cutting balloon PTCA of the LAD in June 2019 for in stent restenosis.  He also has a h/o HTN, HL, GERD, and hypertrophic cardiomyopathy.     He called on 02/19/17 with complaints of dyspnea and dizziness that he states were similar to when he had his MI. He felt very flushed in his face. He was referred to the ED.  BP recorded by EMS was 210/107. Came down to 159/87 in ED.  Ecg showed NSR with a RBBB- new in January 2018. No acute change. Delta troponin was normal. UA, BMET, BNP were all normal. He was discharged on amlodipine  5 mg daily.    He was evaluated with Echo in April 2018 showing moderate LVH with severe basal septal hypertrophy. No significant outflow gradient. Mild MR. Myoview  study showed a small area of scar at the base of the lateral wall. No ischemia. EF 51%. He presented in June 2018 with symptoms of worsening chest pain and dyspnea on exertion. He underwent cardiac cath showing 95% in stent restenosis of the proximal LAD treated with cutting  balloon angioplasty. There was a 65% mid RCA stenosis and 50% in the mid LCx. He was enrolled in the CLEAR trial for hyperlipidemia given history of statin and Zetia intolerance.   The patient was seen in the office 12/17/2019 after an episode of near syncope a couple on New Year's Day.  They had just eaten and he was in his car with his daughter (who is a paramedic) driving a plate of food over to his brother's house.  While  driving he became weak and near syncopal.  He denied any diaphoresis, shortness of breath, or nausea and vomiting.  His daughter took his pulse and thought he might be in atrial fibrillation but if it was present it did not last long.  His symptoms resolved quickly.  He did have a recurrent episode the following 2 days but none since.    A 7 day ZIO monitor was done and showed episodic bradycardia down to the 40s- usually early am.  He also had frequent PVCs and short runs of PSVT.  In addition he had runs of NSWCT that appear to be NSVT. The longest was 14 beats.  He has had no further episodes of near syncope or symptomatic tachycardia.  Cardiac MRI was ordered with results noted below. Given syncopal episode, wide complex tachycardia noted on monitor, HCM with late gadolinium uptake on MRI and relatively low EF 52% I recommended EP evaluation to see if ICD indicated. He initially deferred. Seen by Dr Waddell on 03/23/20. He did eventually undergo ICD implant in April 2022.   He was admitted in October 2021 with a stroke. Had right arm and 4th and 5th finger numbness and weakness. Multiple left MCA infarcts embolic secondary to unknown source.CTA head & neck advanced atherosclerosis B ICA bifurcation and  bulbs. B ICA 30% stenosis w/ pronounced irregularity. Proximal R Subclavian 50%. R VA origin 30%. L VA origin 50%. Mid to distal BA. No LVO. MRI  showed Punctate L primary motor cortec and L subcortical parietal white matter infarcts. MR CS multilevel CX spondylosis w/ moderate and severe foraminal stenosis / narrowing C4-11. Echo showed no cardioembolic source. He was seen by EP for consideration of loop recorder but after discussion he did agree to have ICD implant. He was subsequently started on Praluent  for his hyperlipidemia.   He was seen in the ED on 08/23/21 with chest pain. States he had severe chest pain radiating into his neck bilateral and it hurt to take a deep breath. This lasted a couple of  hours at least. Did not change with sl Ntg x 1. States his pain eventually wore off. BP was elevated at 200/104.  Patient's initial evaluation was started in the ED but patient did not stay he left the ED AMA. Overall labs were stable.  Troponin level was slightly elevated at 22 and EKG unchanged from prior. He states pain is different than when he had blockage before. Suspect this was more reflux.   On follow up today he is doing well. He did have pretty bad vertigo for 5 weeks but this responded well to vestibular maneuvers with ENT. Denies any SOB, chest pain or palpitations. Was noted on recent labs to have high PSA 13.1. does have difficulty voiding. Was started on Flomax and plans to see Urology.    Past Medical History:  Diagnosis Date   Arthritis    Cancer (HCC)    SKIN CA  SQUAMOUS CELL   Coronary artery disease    a. 2007 Stent to LAD, PTCA D1;  b. DES to LAD & LCx 10/13 in the setting of STEMI.   Dyslipidemia    a. Statin and zetia-intolerant. On niacin .   GERD (gastroesophageal reflux disease)    Heart murmur    Hyperlipidemia    Phreesia 11/22/2020   Hypertension    Hypertrophic cardiomyopathy (HCC)    a. 09/2012 Echo: EF 55%, mid to apical anterolateral, posterior, apical anterior, apical HK, Gr1 DD, turbulence across LVOT suggesting degree of obstruction, mild MR, mildly dil LA.   Myocardial infarction (HCC)    Phreesia 11/22/2020   Stroke [facility])    Phreesia 11/22/2020    Past Surgical History:  Procedure Laterality Date   CARDIAC CATHETERIZATION  2007   stent to LAD and PTCA of diagonal branch   CARDIAC CATHETERIZATION  2010   CORONARY ANGIOPLASTY WITH STENT PLACEMENT  2013   95-99% prox LAD, patent LAD stent distal to this, occluded mid-dist LCx, mild <10% RCA irreg; s/p DES-prox LAD & DES to mid-dist LCx   CORONARY BALLOON ANGIOPLASTY N/A 05/16/2017   Procedure: Coronary Balloon Angioplasty;  Surgeon: Anner Alm ORN, MD;  Location: [facility] INVASIVE CV LAB;  Service:  Cardiovascular;  Laterality: N/A;   ELBOW SURGERY  1996   right   ICD IMPLANT N/A 03/10/2021   Procedure: ICD IMPLANT;  Surgeon: Waddell Danelle ORN, MD;  Location: [facility] INVASIVE CV LAB;  Service: Cardiovascular;  Laterality: N/A;   KNEE SURGERY  1993   left   LEFT HEART CATH Bilateral 09/22/2012   Procedure: LEFT HEART CATH;  Surgeon: Ginnie Marich M Aamari Strawderman, MD;  Location: [facility] CATH LAB;  Service: Cardiovascular;  Laterality: Bilateral;   LEFT HEART CATH AND CORONARY ANGIOGRAPHY N/A 05/16/2017   Procedure: Left Heart Cath and Coronary Angiography;  Surgeon:  Anner Alm ORN, MD;  Location: [facility] INVASIVE CV LAB;  Service: Cardiovascular;  Laterality: N/A;   PERCUTANEOUS CORONARY STENT INTERVENTION (PCI-S) Right 09/22/2012   Procedure: PERCUTANEOUS CORONARY STENT INTERVENTION (PCI-S);  Surgeon: Zelta Enfield M Kamar Callender, MD;  Location: [facility] CATH LAB;  Service: Cardiovascular;  Laterality: Right;     Current Outpatient Medications  Medication Sig Dispense Refill   amLODipine  (NORVASC ) 2.5 MG tablet TAKE 1 TABLET BY MOUTH EVERY DAY 90 tablet 2   apixaban  (ELIQUIS ) 5 MG TABS tablet Take 1 tablet (5 mg total) by mouth 2 (two) times daily. 180 tablet 1   carvedilol  (COREG ) 12.5 MG tablet TAKE 0.5 TABLETS (6.25 MG TOTAL) BY MOUTH 2 (TWO) TIMES DAILY. 90 tablet 2   clopidogrel  (PLAVIX ) 75 MG tablet TAKE 1 TABLET BY MOUTH EVERY DAY 90 tablet 3   esomeprazole (NEXIUM) 20 MG capsule Take 20 mg by mouth every morning.     Evolocumab  (REPATHA  SURECLICK) 140 MG/ML SOAJ INJECT 140 MG INTO THE SKIN EVERY 14 (FOURTEEN) DAYS. 6 mL 3   lisinopril  (ZESTRIL ) 40 MG tablet TAKE 1 TABLET BY MOUTH EVERY DAY 90 tablet 3   tamsulosin (FLOMAX) 0.4 MG CAPS capsule Take 1 capsule (0.4 mg total) by mouth daily. 30 capsule 3   valACYclovir  (VALTREX ) 500 MG tablet Take 1 tablet (500 mg total) by mouth daily. 90 tablet 1   nitroGLYCERIN  (NITROSTAT ) 0.4 MG SL tablet Place 1 tablet (0.4 mg total) under the tongue every 5 (five) minutes as needed for chest  pain. 25 tablet 2   No current facility-administered medications for this visit.    Allergies:   Antihistamines, chlorpheniramine-type; Pheniramine; Statins; and Zetia [ezetimibe]    Social History:  The patient  reports that he has never smoked. He has never been exposed to tobacco smoke. He has never used smokeless tobacco. He reports that he does not drink alcohol and does not use drugs.   Family History:  The patient's family history includes AAA (abdominal aortic aneurysm) in his father; Heart attack (age of onset: 17) in his mother; Heart disease in his father and mother.    ROS:  Please see the history of present illness.   Otherwise, review of systems are positive for none.   All other systems are reviewed and negative.    PHYSICAL EXAM: VS:  BP 120/72 (BP Location: Left Arm, Patient Position: Sitting, Cuff Size: Large)   Pulse 67   Resp 16   Ht 5' 8 (1.727 m)   Wt 176 lb 3.2 oz (79.9 kg)   SpO2 95%   BMI 26.79 kg/m  , BMI Body mass index is 26.79 kg/m. GEN: Well nourished, well developed, in no acute distress  HEENT: normal  Neck: no JVD, carotid bruits, or masses Cardiac: RRR;  rubs, or gallops,no edema. Harsh gr 2/6 systolic murmur LSB and apex. Respiratory:  clear to auscultation bilaterally, normal work of breathing GI: soft, nontender, nondistended, + BS MS: no deformity or atrophy  Skin: warm and dry, no rash Neuro:  Strength and sensation are intact Psych: euthymic mood, full affect   EKG Interpretation Date/Time:  Tuesday October 14 2024 08:44:53 EST Ventricular Rate:  73 PR Interval:  204 QRS Duration:  140 QT Interval:  410 QTC Calculation: 451 R Axis:   -36  Text Interpretation: Normal sinus rhythm Left axis deviation Right bundle branch block Inferior infarct , age undetermined When compared with ECG of 19-Jun-2023 09:24, Sinus rhythm has replaced Electronic atrial pacemaker Confirmed by Yostin Malacara (  [id number]) on 10/14/2024 8:49:30 AM    Recent  Labs: 10/06/2024: ALT 18; BUN 17; Creatinine, Ser 1.25; Hemoglobin 13.7; Platelets 193; Potassium 4.5; Sodium 139    Lipid Panel    Component Value Date/Time   CHOL 99 (L) 05/08/2024 1051   TRIG 81 05/08/2024 1051   HDL 38 (L) 05/08/2024 1051   CHOLHDL 2.6 05/08/2024 1051   CHOLHDL 7.7 09/16/2020 0428   VLDL 48 (H) 09/16/2020 0428   LDLCALC 44 05/08/2024 1051   LDLDIRECT 159.5 10/01/2012 0847      Wt Readings from Last 3 Encounters:  10/14/24 176 lb 3.2 oz (79.9 kg)  09/30/24 175 lb 6.4 oz (79.6 kg)  09/11/24 175 lb 8 oz (79.6 kg)      Other studies Reviewed: Additional studies/ records that were reviewed today include:   Cardiac cath 05/16/17:  Coronary Balloon Angioplasty  Left Heart Cath and Coronary Angiography  Conclusion    Ost RCA lesion, 40 %stenosed. Mid RCA lesion, 65 %stenosed. Borderline significant. The left ventricular systolic function is normal. LV end diastolic pressure is mildly elevated. There is no mitral valve regurgitation. There is no aortic valve stenosis. Prox LAD to Mid LAD bare-metal stent, 20 %stenosed. Mid LAD lesion, 40 %stenosed. Localized segment of previous bare-metal stent- 2nd Mrg DES stent, 0 %stenosed. Prox Cx to Mid Cx lesion, 40 %stenosed. Ost LAD lesion, 95 %stenosed. Post intervention, there is a 10% residual stenosis.   Mr. Bearce has several potential culprit lesions, however the most obvious one in the LAD with 95% stenosis. This is in-stent restenosis, therefore treated with balloon angioplasty using the [facility] Cutting Balloon followed by post-dilation with a noncompliant balloon.   The RCA lesion is of borderline significance, but does not appear to be acute in nature. Would defer intervention on this vessel at this time unless she has ongoing or worsening symptoms.     Plan: Overnight monitoring and likely discharge tomorrow Return to nursing unit for ongoing care and TR been removal. Back on dual antiplatelet  therapy.- Reduced aspirin  81 mg him and using Brilinta . Further risk factor modification per rounding team.     He can follow-up with Dr. Anupama Piehl, will need to have a follow-up appointment rescheduled as he was post be there tomorrow June 14.       Alm Clay, M.D., M.S. Interventional Cardiologist      Echo 07/14/19: IMPRESSIONS     1. The left ventricle has normal systolic function with an ejection  fraction of 60-65%. The cavity size was normal. Severe basal septal  hypertrophy and mild concentric hypertrophy. Left ventricular diastolic  Doppler parameters are consistent with  pseudonormalization. Elevated left ventricular end-diastolic pressure.   2. The average left ventricular global longitudinal strain is -16.0 %.   3. The right ventricle has normal systolic function. The cavity was  normal. There is no increase in right ventricular wall thickness.   4. Left atrial size was mildly dilated.   5. The aortic valve is tricuspid. Moderate sclerosis of the aortic valve.  Aortic valve regurgitation is mild by color flow Doppler.   6. The aorta is normal in size and structure.   Cardiac MRI 02/25/20: CLINICAL DATA:  Hypertrophic cardiomyopathy suspected, further testing   COMPARISON: Echocardiogram 07/14/2019   EXAM: CARDIAC MRI   TECHNIQUE: The patient was scanned on a 1.5 Tesla GE magnet. A dedicated cardiac coil was used. Functional imaging was done using Fiesta sequences. 2,3, and 4 chamber views were done to assess for  RWMA's. Modified Simpson's rule using a short axis stack was used to calculate an ejection fraction on a dedicated work [employer]. The patient received 10mL GADAVIST  GADOBUTROL  1 MMOL/ML IV SOLN. After 10 minutes inversion recovery sequences were used to assess for infiltration and scar tissue.   CONTRAST:  10mL GADAVIST  GADOBUTROL  1 MMOL/ML IV SOLN   FINDINGS: LEFT VENTRICLE:   Normal LV chamber size.   Wall thickness is  increased in the basal septum as described below. Wall thinning in the anterior wall and inferolateral wall. The lateral wall demonstrates scar and is significantly thinned.   Akinesis of the inferolateral wall from base to mid ventricle, and hypokinesis of lateral apex. Hypokinesis of the anterior wall from base to apex.   LVEF = 52%   Maximal wall thickness: 18 mm   Location: Basal anteroseptum   There is systolic anterior motion of the mitral valve. Flow dephasing suggests left ventricular outflow tract obstruction. Mitral regurgitation is seen, posteriorly directed, eccentric and qualitatively mild-moderate.   No evidence of left ventricular apical aneurysm.   Findings are consistent with hypertrophic cardiomyopathy with obstruction. Morphologic subtype: Sigmoid.   There is late gadolinium enhancement in the left ventricular myocardium.   LGE noted subendocardium of the inferolateral and the inferior portion of the anterolateral wall from base to mid ventricle, including a mildly aneurysmal appearing inferolateral mid ventricular segment. Delayed myocardial enhancement is <50% of the thickness of the lateral wall, suggesting viability in this coronary distribution.   There is also LGE in the subendocardium of the anterior wall from base to mid ventricle. Delayed myocardial enhancement is <50% of the thickness of the anterior wall, suggesting viability in this coronary distribution.   Mild delayed myocardial enhancement noted at the RV insertion points. This represents <1% of the myocardial mass. Quantitation of LGE not performed in the setting of prominent delayed gadolinium enhancement from ischemic heart disease.   Normal T1 myocardial nulling kinetics suggests against a diagnosis of cardiac amyloidosis.   ECV = 22%, normal.   RIGHT VENTRICLE:   Normal right ventricular size, thickness and systolic function (RVEF =60%). There are no regional wall motion  abnormalities.   ATRIA:   Mild left atrial enlargement.  Normal right atrial chamber size.   VALVES:   Mild-moderate mitral valve regurgitation. Due to systolic anterior motion by flow dephasing.   Trivial aortic valve regurgitation by flow dephasing.   PERICARDIUM:   Normal pericardium.  Trace circumferential pericardial effusion.   OTHER: No significant extracardiac findings noted.   MEASUREMENTS:   LVEDV: 174 mL   LVESV: 83 mL   SV: 91 mL   CO: 5.5 L/min   Myocardial mass: 160 g   RVEDV: 121 mL   RVEDS: 49 mL   RVSV: 72 mL   IMPRESSION: 1.  Normal left ventricular size, LVEF 52%.   2. Akinesis of the basal-mid inferolateral wall with scar and wall thinning. Hypokinesis of the basal-apical anterior wall. Subendocardial delayed enhancement that is <50% thickness of the myocardium, suggestive of ischemic heart disease with viability in these regions.   3. Hypertrophic cardiomyopathy, sigmoid subtype. 18 mm basal anteroseptum.   4. Left ventricular outflow tract obstruction with systolic anterior motion of the mitral valve and qualitatively mild-moderate mitral valve regurgitation.   5. Mild myocardial enhancement in the RV insertion points at the base. No significant delayed myocardial enhancement associated with area of myocardial thickening.   6. No evidence of cardiac amyloidosis. Normal extracellular volume, 22%.  7.  Normal right ventricular size and function, RVEF 60%   8.  Trace pericardial effusion.     Electronically Signed   By: Soyla Merck   On: 02/29/2020 18:43   Echo 09/16/20: IMPRESSIONS     1. Left ventricular ejection fraction, by estimation, is 65 to 70%. The  left ventricle has normal function. The left ventricle has no regional  wall motion abnormalities. There is moderate concentric left ventricular  hypertrophy. Left ventricular  diastolic parameters are consistent with Grade I diastolic dysfunction  (impaired  relaxation).   2. Right ventricular systolic function is normal. The right ventricular  size is normal.   3. Left atrial size was moderately dilated.   4. The mitral valve is normal in structure. Trivial mitral valve  regurgitation. No evidence of mitral stenosis.   5. The aortic valve is normal in structure. There is mild calcification  of the aortic valve. There is mild thickening of the aortic valve. Aortic  valve regurgitation is mild. No aortic stenosis is present.   6. The inferior vena cava is normal in size with greater than 50%  respiratory variability, suggesting right atrial pressure of 3 mmHg.   Conclusion(s)/Recommendation(s): No intracardiac source of embolism  detected on this transthoracic study. A transesophageal echocardiogram is  recommended to exclude cardiac source of embolism if clinically indicated.   ASSESSMENT AND PLAN:    1. Hypertrophic CM:  Most recent Echo in Oct 2021 showed no significant LVOT gradient.  Moderate LVH and normal EF. Late gadolinium enhancement. He has NSVT noted on monitor. Now s/p ICD implant.  He is asymptomatic. Recommend follow up Echo now.    2.  CAD: s/p prior LAD, D1, and LCX PCI with STEMI in 2013. Admitted in June 2018 with in stent restenosis of the proximal LAD. Symptoms predominantly of DOE. Treated with cutting balloon PCI. Interestedly his Myoview  study prior was normal so may not be a reliable study.  Currently no symptoms. Continue amlodipine  and Coreg .  Would continue Plavix  but not ASA since now on Eliquis .    3.  HL:   intolerant to statins and zetia. On CLEAR trial now. LDL was 155. Now on Repatha .  Excellent response with LDL 44. Followed by Dr Mona   4.  Hypertension:  BP is well controlled.    5.  S/p CVA October 2021. Likely related to embolus nonobstructive but extensive carotid arterial disease.   6.  Paroxysmal Afib noted on device check. CHAD Vasc score of 5. Now on Eliquis .   7.  Elevated PSA with obstructive  symptoms. Follow up with Urology        Current medicines are reviewed at length with the patient today.  The patient does not have concerns regarding medicines.  The following changes have been made:  no change  Labs/ tests ordered today include:   Orders Placed This Encounter  Procedures   EKG 12-Lead      Disposition:   FU with me in 6 months.  Signed, Makilah Dowda, MD  10/14/2024 9:00 AM    [facility] [address], [place], [place], [address] Phone [phone], Fax [phone]

## 2024-10-10 ENCOUNTER — Ambulatory Visit: Payer: Self-pay

## 2024-10-10 DIAGNOSIS — R972 Elevated prostate specific antigen [PSA]: Secondary | ICD-10-CM

## 2024-10-14 ENCOUNTER — Ambulatory Visit: Attending: Cardiology | Admitting: Cardiology

## 2024-10-14 ENCOUNTER — Encounter: Payer: Self-pay | Admitting: Cardiology

## 2024-10-14 VITALS — BP 120/72 | HR 67 | Resp 16 | Ht 68.0 in | Wt 176.2 lb

## 2024-10-14 DIAGNOSIS — I2510 Atherosclerotic heart disease of native coronary artery without angina pectoris: Secondary | ICD-10-CM | POA: Diagnosis not present

## 2024-10-14 DIAGNOSIS — I422 Other hypertrophic cardiomyopathy: Secondary | ICD-10-CM | POA: Diagnosis not present

## 2024-10-14 DIAGNOSIS — I495 Sick sinus syndrome: Secondary | ICD-10-CM

## 2024-10-14 DIAGNOSIS — I480 Paroxysmal atrial fibrillation: Secondary | ICD-10-CM

## 2024-10-14 DIAGNOSIS — Z9861 Coronary angioplasty status: Secondary | ICD-10-CM | POA: Diagnosis not present

## 2024-10-14 DIAGNOSIS — Z95810 Presence of automatic (implantable) cardiac defibrillator: Secondary | ICD-10-CM | POA: Diagnosis not present

## 2024-10-14 MED ORDER — NITROGLYCERIN 0.4 MG SL SUBL: 0.4000 mg | SUBLINGUAL_TABLET | SUBLINGUAL | 2 refills | Status: AC | PRN

## 2024-10-14 NOTE — Patient Instructions (Signed)
 Medication Instructions:  Continue same medications *If you need a refill on your cardiac medications before your next appointment, please call your pharmacy*  Lab Work: None ordered  Testing/Procedures: Echo   first available   Follow-Up: At [facility], you and your health needs are our priority.  As part of our continuing mission to provide you with exceptional heart care, our providers are all part of one team.  This team includes your primary Cardiologist (physician) and Advanced Practice Providers or APPs (Physician Assistants and Nurse Practitioners) who all work together to provide you with the care you need, when you need it.  Your next appointment:  6 months    Call in Feb to schedule May appointment     Provider:  Dr.Jordan   We recommend signing up for the patient portal called MyChart.  Sign up information is provided on this After Visit Summary.  MyChart is used to connect with patients for Virtual Visits (Telemedicine).  Patients are able to view lab/test results, encounter notes, upcoming appointments, etc.  Non-urgent messages can be sent to your provider as well.   To learn more about what you can do with MyChart, go to [email or web address].

## 2024-10-15 ENCOUNTER — Ambulatory Visit: Attending: Student in an Organized Health Care Education/Training Program | Admitting: Internal Medicine

## 2024-10-15 ENCOUNTER — Encounter: Payer: Self-pay | Admitting: Internal Medicine

## 2024-10-15 VITALS — BP 129/78 | HR 63 | Resp 16 | Ht 68.0 in | Wt 172.0 lb

## 2024-10-15 DIAGNOSIS — Z9861 Coronary angioplasty status: Secondary | ICD-10-CM

## 2024-10-15 DIAGNOSIS — E785 Hyperlipidemia, unspecified: Secondary | ICD-10-CM

## 2024-10-15 DIAGNOSIS — Z8673 Personal history of transient ischemic attack (TIA), and cerebral infarction without residual deficits: Secondary | ICD-10-CM | POA: Diagnosis not present

## 2024-10-15 DIAGNOSIS — M791 Myalgia, unspecified site: Secondary | ICD-10-CM | POA: Diagnosis not present

## 2024-10-15 DIAGNOSIS — T466X5D Adverse effect of antihyperlipidemic and antiarteriosclerotic drugs, subsequent encounter: Secondary | ICD-10-CM | POA: Diagnosis not present

## 2024-10-15 DIAGNOSIS — T466X5A Adverse effect of antihyperlipidemic and antiarteriosclerotic drugs, initial encounter: Secondary | ICD-10-CM

## 2024-10-15 DIAGNOSIS — I2510 Atherosclerotic heart disease of native coronary artery without angina pectoris: Secondary | ICD-10-CM

## 2024-10-15 MED ORDER — REPATHA SURECLICK 140 MG/ML ~~LOC~~ SOAJ: 1.0000 | SUBCUTANEOUS | 3 refills | Status: AC

## 2024-10-15 NOTE — Patient Instructions (Signed)
 Medication Instructions:  NO CHANGES  *If you need a refill on your cardiac medications before your next appointment, please call your pharmacy*   Follow-Up: At [facility], you and your health needs are our priority.  As part of our continuing mission to provide you with exceptional heart care, our providers are all part of one team.  This team includes your primary Cardiologist (physician) and Advanced Practice Providers or APPs (Physician Assistants and Nurse Practitioners) who all work together to provide you with the care you need, when you need it.  Your next appointment:    AS NEEDED with Dr. Mona for lipid management   CONTINUE routine appointments with Dr. Jordan   We recommend signing up for the patient portal called MyChart.  Sign up information is provided on this After Visit Summary.  MyChart is used to connect with patients for Virtual Visits (Telemedicine).  Patients are able to view lab/test results, encounter notes, upcoming appointments, etc.  Non-urgent messages can be sent to your provider as well.   To learn more about what you can do with MyChart, go to [email or web address].   Other Instructions   The [employer] offers assistance to help pay for medication copays.  They will cover copays for all cholesterol lowering meds, including statins, fibrates, omega-3 fish oils like Vascepa, ezetimibe, Repatha , Praluent , Nexletol, Nexlizet.  The cards are usually good for $2,500 or 12 months, whichever comes first. Our fax # is [phone] (you will need this to apply) Go to healthwellfoundation.org Click on "Apply Now" Answer questions as to whom is applying (patient or representative) Your disease fund will be "hypercholesterolemia - Medicare access" They will ask questions about finances and which medications you are taking for cholesterol When you submit, the approval is usually within minutes.  You will need to print the card information from  the site You will need to show this information to your pharmacy, they will bill your Medicare Part D plan first -then bill [employer] --for the copay.   You can also call them at [phone], although the hold times can be quite long.

## 2024-10-15 NOTE — Progress Notes (Signed)
 LIPID CLINIC CONSULT NOTE  Chief Complaint:  Follow-up dyslipidemia  Primary Care Physician: Chandra Toribio POUR, MD  Primary Cardiologist:  Peter Jordan, MD  HPI:  Brandon Gonzales is a 73 y.o. male who is being seen today for the evaluation of dyslipidemia at the request of Peter Jordan, MD. This is a pleasant male with a complex cardiac history including coronary artery disease with stents to the LAD and circumflex with prior STEMI, hypertrophic cardiomyopathy who recently had an ICD placed, prior myocardial infarction, dyslipidemia, hypertension and stroke.  Unfortunately, he has been intolerant to numerous statins including rosuvastatin, atorvastatin and ezetimibe, all causing severe leg cramps.  He was referred to the research foundation and was enrolled in the CLEAR-outcomes trial, however he was randomized to placebo and therefore had no benefit from bempedoic acid.  He just completed the trial and was referred by his primary care provider for options for management of his dyslipidemia.  His most recent lipid profile was in October however appears to be fairly stable with his other previous studies.  This demonstrated total cholesterol 208, triglycerides 238, HDL 27 and LDL of 133.  His target LDL is less than 70.  He says despite an excellent diet and work at trying to lower his cholesterol, he has not been able to get his LDL to target.   01/26/2022   Mr. Lewison is seen today for follow-up.  He is doing well on Praluent .  He had a marked reduction in his lipids.  Total cholesterol now 111 (down from 210), triglycerides 87, HDL 40 and LDL 54, down from 155.  He is also tolerating the medicine very well.  Unfortunately he developed influenza.  He is now going to start on some Tamiflu  and this is why we did a virtual visit today.  His only other concern was the cost of the medication.  He says he does not think he will qualify for need based grant.  Cost is gone up significantly though.  I  believe he said $900 for 64-month supply   07/31/2022   Mr. Lanuza continues to do well.  He is tolerating Praluent  without any issues.  He is on the 150 mg every 2 week dose.  Lipids have further improved with total cholesterol 90, triglycerides 98, HDL 35 and LDL 36, down from 54 about 8 months ago.   10/05/2023   Mr. Sobek returns today for follow-up.  He has done very well on PCSK9 inhibitor therapy. He has switched from Praluent  to Repatha  due to insurance reasons.  Nonetheless he continues to work well for him.  ALT 96, triglycerides 122, HDL 37 and LDL 37.  Liver enzymes have been normal.  His only other concern today is that recently he has been having significant bruising and easy bleeding without being able to get bleeding to stop.  This included was when he cut himself shaving or after he had some dermatologic procedures.  He had recently been started on Eliquis  for atrial fibrillation that was detected on his device and also is on Plavix  given a history of coronary artery disease with prior stents.  10/15/2024  Mr. Crooke is seen today in follow-up.  He last saw Dr. Jordan yesterday.  He was doing very well.  He denies any cardiac symptoms.  He is tolerating Repatha .  His cholesterol control has been excellent with recent lipids in June showing total cholesterol 99, HDL 38, triglycerides 81 and LDL 44.  Cost has been an issue with  the medicine.  Will provide him with health well grant information but he feels that he might not meet the income requirements.  I have encouraged him to consider different insurance options which may help better cover the medicine.  PMHx:  Past Medical History:  Diagnosis Date   Arthritis    Cancer (HCC)    SKIN CA  SQUAMOUS CELL   Coronary artery disease    a. 2007 Stent to LAD, PTCA D1;  b. DES to LAD & LCx 10/13 in the setting of STEMI.   Dyslipidemia    a. Statin and zetia-intolerant. On niacin .   GERD (gastroesophageal reflux disease)    Heart  murmur    Hyperlipidemia    Phreesia 11/22/2020   Hypertension    Hypertrophic cardiomyopathy (HCC)    a. 09/2012 Echo: EF 55%, mid to apical anterolateral, posterior, apical anterior, apical HK, Gr1 DD, turbulence across LVOT suggesting degree of obstruction, mild MR, mildly dil LA.   Myocardial infarction (HCC)    Phreesia 11/22/2020   Stroke [facility])    Phreesia 11/22/2020    Past Surgical History:  Procedure Laterality Date   CARDIAC CATHETERIZATION  2007   stent to LAD and PTCA of diagonal branch   CARDIAC CATHETERIZATION  2010   CORONARY ANGIOPLASTY WITH STENT PLACEMENT  2013   95-99% prox LAD, patent LAD stent distal to this, occluded mid-dist LCx, mild <10% RCA irreg; s/p DES-prox LAD & DES to mid-dist LCx   CORONARY BALLOON ANGIOPLASTY N/A 05/16/2017   Procedure: Coronary Balloon Angioplasty;  Surgeon: Anner Alm ORN, MD;  Location: [facility] INVASIVE CV LAB;  Service: Cardiovascular;  Laterality: N/A;   ELBOW SURGERY  1996   right   ICD IMPLANT N/A 03/10/2021   Procedure: ICD IMPLANT;  Surgeon: Waddell Danelle ORN, MD;  Location: [facility] INVASIVE CV LAB;  Service: Cardiovascular;  Laterality: N/A;   KNEE SURGERY  1993   left   LEFT HEART CATH Bilateral 09/22/2012   Procedure: LEFT HEART CATH;  Surgeon: Peter M Jordan, MD;  Location: [facility] CATH LAB;  Service: Cardiovascular;  Laterality: Bilateral;   LEFT HEART CATH AND CORONARY ANGIOGRAPHY N/A 05/16/2017   Procedure: Left Heart Cath and Coronary Angiography;  Surgeon: Anner Alm ORN, MD;  Location: [facility] INVASIVE CV LAB;  Service: Cardiovascular;  Laterality: N/A;   PERCUTANEOUS CORONARY STENT INTERVENTION (PCI-S) Right 09/22/2012   Procedure: PERCUTANEOUS CORONARY STENT INTERVENTION (PCI-S);  Surgeon: Peter M Jordan, MD;  Location: [facility] CATH LAB;  Service: Cardiovascular;  Laterality: Right;    FAMHx:  Family History  Problem Relation Age of Onset   Heart attack Mother 63   Heart disease Mother    AAA (abdominal aortic aneurysm) Father     Heart disease Father     SOCHx:   reports that he has never smoked. He has never been exposed to tobacco smoke. He has never used smokeless tobacco. He reports that he does not drink alcohol and does not use drugs.  ALLERGIES:  Allergies  Allergen Reactions   Antihistamines, Chlorpheniramine-Type Other (See Comments)    Altered mental status   Pheniramine Other (See Comments)    Altered mental status   Statins Other (See Comments)    Leg cramps     Zetia [Ezetimibe] Other (See Comments)    Leg cramps    ROS: Pertinent items noted in HPI and remainder of comprehensive ROS otherwise negative.  HOME MEDS: Current Outpatient Medications on File Prior to Visit  Medication Sig Dispense Refill  amLODipine  (NORVASC ) 2.5 MG tablet TAKE 1 TABLET BY MOUTH EVERY DAY 90 tablet 2   apixaban  (ELIQUIS ) 5 MG TABS tablet Take 1 tablet (5 mg total) by mouth 2 (two) times daily. 180 tablet 1   carvedilol  (COREG ) 12.5 MG tablet TAKE 0.5 TABLETS (6.25 MG TOTAL) BY MOUTH 2 (TWO) TIMES DAILY. 90 tablet 2   clopidogrel  (PLAVIX ) 75 MG tablet TAKE 1 TABLET BY MOUTH EVERY DAY 90 tablet 3   esomeprazole (NEXIUM) 20 MG capsule Take 20 mg by mouth every morning.     Evolocumab  (REPATHA  SURECLICK) 140 MG/ML SOAJ INJECT 140 MG INTO THE SKIN EVERY 14 (FOURTEEN) DAYS. 6 mL 3   lisinopril  (ZESTRIL ) 40 MG tablet TAKE 1 TABLET BY MOUTH EVERY DAY 90 tablet 3   nitroGLYCERIN  (NITROSTAT ) 0.4 MG SL tablet Place 1 tablet (0.4 mg total) under the tongue every 5 (five) minutes as needed for chest pain. 25 tablet 2   tamsulosin (FLOMAX) 0.4 MG CAPS capsule Take 1 capsule (0.4 mg total) by mouth daily. 30 capsule 3   valACYclovir  (VALTREX ) 500 MG tablet Take 1 tablet (500 mg total) by mouth daily. 90 tablet 1   No current facility-administered medications on file prior to visit.    LABS/IMAGING: No results found for this or any previous visit (from the past 48 hours). No results found.  LIPID PANEL:    Component  Value Date/Time   CHOL 99 (L) 05/08/2024 1051   TRIG 81 05/08/2024 1051   HDL 38 (L) 05/08/2024 1051   CHOLHDL 2.6 05/08/2024 1051   CHOLHDL 7.7 09/16/2020 0428   VLDL 48 (H) 09/16/2020 0428   LDLCALC 44 05/08/2024 1051   LDLDIRECT 159.5 10/01/2012 0847    WEIGHTS: Wt Readings from Last 3 Encounters:  10/15/24 172 lb (78 kg)  10/14/24 176 lb 3.2 oz (79.9 kg)  09/30/24 175 lb 6.4 oz (79.6 kg)    VITALS: BP 129/78 (BP Location: Left Arm, Patient Position: Sitting, Cuff Size: Normal)   Pulse 63   Resp 16   Ht 5' 8 (1.727 m)   Wt 172 lb (78 kg)   SpO2 97%   BMI 26.15 kg/m   EXAM: Deferred  EKG: Deferred  ASSESSMENT: Mixed dyslipidemia, goal LDL less than 55 Coronary artery disease with prior STEMI and multiple PCI's Hypertrophic cardiomyopathy status post AICD Hypertension History of stroke Statin and ezetimibe intolerance-myalgias  PLAN: 1.   Mr. Haynes continues to do well on Repatha .  Recent LDL was 44.  Cost has been an issue because of high deductible.  Will provide him with health well grant information however he may not qualify for that.  This seems to continue to be the best option for treatment though.  I would also encouraged him to consider different open enrollment options with an alternative insurance company for next year.  Follow-up with me as needed.  Repatha  renewals can be sent directly to the pharmacy tech prior authorization team.  Vinie KYM Maxcy, MD, [facility] FNLA, FACP  [place]  [facility]  Medical Director of the Advanced Lipid Disorders &  Cardiovascular Risk Reduction Clinic Diplomate of the American Board of Clinical Lipidology Attending Cardiologist  Direct Dial: [phone]  Fax: [phone]  Website:  [email or web address] Vinie BROCKS Shametra C[place] 10/15/2024, 9:11 AM

## 2024-10-23 ENCOUNTER — Other Ambulatory Visit: Payer: Self-pay | Admitting: Cardiology

## 2024-11-04 NOTE — Progress Notes (Unsigned)
 Chief Complaint: Abnormal PSA level  History of Present Illness:  73 yo male here for E/M of elevated PSA level. Prior data--  11.3.2025--13.1 11.21.2023--3.1 10.11.2011--1.4  He has no prior urologic history.  There is no family history of prostate cancer.  Does complain of lower urinary tract symptoms-IPSS 15/3.  He was put on tamsulosin  a bit over a month ago.  Stopped it 2 days because of significant dizziness.   Past Medical History:  Past Medical History:  Diagnosis Date   Arthritis    Cancer (HCC)    SKIN CA  SQUAMOUS CELL   Coronary artery disease    a. 2007 Stent to LAD, PTCA D1;  b. DES to LAD & LCx 10/13 in the setting of STEMI.   Dyslipidemia    a. Statin and zetia-intolerant. On niacin .   GERD (gastroesophageal reflux disease)    Heart murmur    Hyperlipidemia    Phreesia 11/22/2020   Hypertension    Hypertrophic cardiomyopathy (HCC)    a. 09/2012 Echo: EF 55%, mid to apical anterolateral, posterior, apical anterior, apical HK, Gr1 DD, turbulence across LVOT suggesting degree of obstruction, mild MR, mildly dil LA.   Myocardial infarction (HCC)    Phreesia 11/22/2020   Stroke [facility])    Phreesia 11/22/2020    Past Surgical History:  Past Surgical History:  Procedure Laterality Date   CARDIAC CATHETERIZATION  2007   stent to LAD and PTCA of diagonal branch   CARDIAC CATHETERIZATION  2010   CORONARY ANGIOPLASTY WITH STENT PLACEMENT  2013   95-99% prox LAD, patent LAD stent distal to this, occluded mid-dist LCx, mild <10% RCA irreg; s/p DES-prox LAD & DES to mid-dist LCx   CORONARY BALLOON ANGIOPLASTY N/A 05/16/2017   Procedure: Coronary Balloon Angioplasty;  Surgeon: Anner Alm ORN, MD;  Location: [facility] INVASIVE CV LAB;  Service: Cardiovascular;  Laterality: N/A;   ELBOW SURGERY  1996   right   ICD IMPLANT N/A 03/10/2021   Procedure: ICD IMPLANT;  Surgeon: Waddell Danelle ORN, MD;  Location: [facility] INVASIVE CV LAB;  Service: Cardiovascular;  Laterality: N/A;    KNEE SURGERY  1993   left   LEFT HEART CATH Bilateral 09/22/2012   Procedure: LEFT HEART CATH;  Surgeon: Peter M Jordan, MD;  Location: [facility] CATH LAB;  Service: Cardiovascular;  Laterality: Bilateral;   LEFT HEART CATH AND CORONARY ANGIOGRAPHY N/A 05/16/2017   Procedure: Left Heart Cath and Coronary Angiography;  Surgeon: Anner Alm ORN, MD;  Location: [facility] INVASIVE CV LAB;  Service: Cardiovascular;  Laterality: N/A;   PERCUTANEOUS CORONARY STENT INTERVENTION (PCI-S) Right 09/22/2012   Procedure: PERCUTANEOUS CORONARY STENT INTERVENTION (PCI-S);  Surgeon: Peter M Jordan, MD;  Location: [facility] CATH LAB;  Service: Cardiovascular;  Laterality: Right;    Allergies:  Allergies  Allergen Reactions   Antihistamines, Chlorpheniramine-Type Other (See Comments)    Altered mental status   Pheniramine Other (See Comments)    Altered mental status   Statins Other (See Comments)    Leg cramps     Zetia [Ezetimibe] Other (See Comments)    Leg cramps    Family History:  Family History  Problem Relation Age of Onset   Heart attack Mother 70   Heart disease Mother    AAA (abdominal aortic aneurysm) Father    Heart disease Father     Social History:  Social History   Tobacco Use   Smoking status: Never    Passive exposure: Never   Smokeless tobacco: Never  Vaping Use   Vaping status: Never Used  Substance Use Topics   Alcohol use: No   Drug use: No    Review of symptoms:  Constitutional:  Negative for unexplained weight loss, night sweats, fever, chills ENT:  Negative for nose bleeds, sinus pain, painful swallowing CV:  Negative for chest pain, shortness of breath, exercise intolerance, palpitations, loss of consciousness Resp:  Negative for cough, wheezing, shortness of breath GI:  Negative for nausea, vomiting, diarrhea, bloody stools GU:  Positives noted in HPI; otherwise negative for gross hematuria, dysuria, urinary incontinence Neuro:  Negative for seizures, poor balance, limb  weakness, slurred speech Psych:  Negative for lack of energy, depression, anxiety Endocrine:  Negative for polydipsia, polyuria, symptoms of hypoglycemia (dizziness, hunger, sweating) Hematologic:  Negative for anemia, purpura, petechia, prolonged or excessive bleeding, use of anticoagulants  Allergic:  Negative for difficulty breathing or choking as a result of exposure to anything; no shellfish allergy; no allergic response (rash/itch) to materials, foods  Physical exam: There were no vitals taken for this visit. GENERAL APPEARANCE:  Well appearing, well developed, well nourished, NAD HEENT: Atraumatic, Normocephalic. NECK: Normal appearance LUNGS: Normal inspiratory and expiratory excursion HEART: Regular Rate ABDOMEN: No inguinal hernias. GU: Phallus normal, no lesions. Scrotal skin normal. Testicles/epididymal structures normal. Meatus normal. Normal anal sphincter tone, prostate 80 mL, symmetric, non nodular, non tender. EXTREMITIES: Moves all extremities well.  Without clubbing, cyanosis, or edema. NEUROLOGIC:  Alert and oriented x 3, normal gait, CN II-XII grossly intact.  MENTAL STATUS:  Appropriate. SKIN:  Warm, dry and intact.    Results:  I have reviewed referring/prior physicians notes  I have reviewed urinalysis  I have reviewed PSA results  I have reviewed prior imaging   Assessment: 1.  BPH with significant symptomatology, large gland, feels benign in nature.  He did not do well with the trial of tamsulosin   2.  Elevated PSA-baseline 14 years ago was 1.4.  2 years ago 3.1.  It increased significantly, but this may be a spurious change  3.  Microscopic hematuria  Plan: 1.  I will recheck PSA today, notify patient with result  2.  I started him on finasteride, will stay off of the alpha blockers due to prior response from tamsulosin   3.  Causative factors of PSA elevation discussed with patient  4.  If PSA still up, we will proceed with MRI of prostate  5.   He will need eventual microscopic hematuria evaluation

## 2024-11-05 ENCOUNTER — Ambulatory Visit: Admitting: Urology

## 2024-11-05 VITALS — BP 151/89 | HR 66 | Ht 68.0 in | Wt 170.0 lb

## 2024-11-05 DIAGNOSIS — R3129 Other microscopic hematuria: Secondary | ICD-10-CM

## 2024-11-05 DIAGNOSIS — N401 Enlarged prostate with lower urinary tract symptoms: Secondary | ICD-10-CM

## 2024-11-05 DIAGNOSIS — N138 Other obstructive and reflux uropathy: Secondary | ICD-10-CM

## 2024-11-05 DIAGNOSIS — R972 Elevated prostate specific antigen [PSA]: Secondary | ICD-10-CM | POA: Diagnosis not present

## 2024-11-05 LAB — URINALYSIS, ROUTINE W REFLEX MICROSCOPIC
Bilirubin, UA: NEGATIVE
Glucose, UA: NEGATIVE
Ketones, UA: NEGATIVE
Nitrite, UA: NEGATIVE
Protein,UA: NEGATIVE
Specific Gravity, UA: 1.01 (ref 1.005–1.030)
Urobilinogen, Ur: 0.2 mg/dL (ref 0.2–1.0)
pH, UA: 6 (ref 5.0–7.5)

## 2024-11-05 LAB — BLADDER SCAN AMB NON-IMAGING: Scan Result: 53

## 2024-11-05 LAB — MICROSCOPIC EXAMINATION

## 2024-11-05 MED ORDER — FINASTERIDE 5 MG PO TABS: 5.0000 mg | ORAL_TABLET | Freq: Every day | ORAL | 11 refills | Status: AC

## 2024-11-06 LAB — PSA: Prostate Specific Ag, Serum: 8 ng/mL — ABNORMAL HIGH (ref 0.0–4.0)

## 2024-11-10 DIAGNOSIS — L570 Actinic keratosis: Secondary | ICD-10-CM | POA: Diagnosis not present

## 2024-11-10 DIAGNOSIS — L812 Freckles: Secondary | ICD-10-CM | POA: Diagnosis not present

## 2024-11-10 DIAGNOSIS — Z85820 Personal history of malignant melanoma of skin: Secondary | ICD-10-CM | POA: Diagnosis not present

## 2024-11-10 DIAGNOSIS — Z85828 Personal history of other malignant neoplasm of skin: Secondary | ICD-10-CM | POA: Diagnosis not present

## 2024-11-10 DIAGNOSIS — L820 Inflamed seborrheic keratosis: Secondary | ICD-10-CM | POA: Diagnosis not present

## 2024-11-10 DIAGNOSIS — L821 Other seborrheic keratosis: Secondary | ICD-10-CM | POA: Diagnosis not present

## 2024-11-12 ENCOUNTER — Ambulatory Visit: Payer: Self-pay | Admitting: Urology

## 2024-11-12 ENCOUNTER — Other Ambulatory Visit: Payer: Self-pay | Admitting: Urology

## 2024-11-12 DIAGNOSIS — R972 Elevated prostate specific antigen [PSA]: Secondary | ICD-10-CM

## 2024-11-14 ENCOUNTER — Other Ambulatory Visit: Payer: Self-pay

## 2024-11-14 ENCOUNTER — Encounter: Payer: Self-pay | Admitting: Cardiology

## 2024-11-14 DIAGNOSIS — I480 Paroxysmal atrial fibrillation: Secondary | ICD-10-CM

## 2024-11-14 MED ORDER — AMLODIPINE BESYLATE 2.5 MG PO TABS: 2.5000 mg | ORAL_TABLET | Freq: Every day | ORAL | 2 refills | Status: AC

## 2024-11-14 MED ORDER — APIXABAN 5 MG PO TABS: 5.0000 mg | ORAL_TABLET | Freq: Two times a day (BID) | ORAL | 1 refills | Status: DC

## 2024-11-24 ENCOUNTER — Ambulatory Visit (HOSPITAL_COMMUNITY)
Admission: RE | Admit: 2024-11-24 | Discharge: 2024-11-24 | Disposition: A | Discharge status: Home / Self Care | Source: Ambulatory Visit | Attending: Cardiology | Admitting: Cardiology

## 2024-11-24 DIAGNOSIS — I421 Obstructive hypertrophic cardiomyopathy: Secondary | ICD-10-CM | POA: Diagnosis not present

## 2024-11-24 DIAGNOSIS — I422 Other hypertrophic cardiomyopathy: Secondary | ICD-10-CM | POA: Insufficient documentation

## 2024-11-26 LAB — ECHOCARDIOGRAM COMPLETE
AR max vel: 3.1 cm2
AV Area VTI: 2.99 cm2
AV Area mean vel: 3.23 cm2
AV Mean grad: 6 mmHg
AV Peak grad: 10 mmHg
Ao pk vel: 1.58 m/s
Area-P 1/2: 4.41 cm2
P 1/2 time: 682 ms
S' Lateral: 3.1 cm

## 2024-11-28 ENCOUNTER — Ambulatory Visit: Payer: Self-pay | Admitting: Cardiology

## 2024-12-05 ENCOUNTER — Ambulatory Visit

## 2024-12-05 VITALS — BP 136/77 | HR 65 | Temp 98.2°F | Ht 68.0 in | Wt 175.0 lb

## 2024-12-05 DIAGNOSIS — J069 Acute upper respiratory infection, unspecified: Secondary | ICD-10-CM | POA: Diagnosis not present

## 2024-12-05 DIAGNOSIS — R509 Fever, unspecified: Secondary | ICD-10-CM | POA: Diagnosis not present

## 2024-12-05 DIAGNOSIS — R399 Unspecified symptoms and signs involving the genitourinary system: Secondary | ICD-10-CM

## 2024-12-05 DIAGNOSIS — R0981 Nasal congestion: Secondary | ICD-10-CM | POA: Diagnosis not present

## 2024-12-05 DIAGNOSIS — J029 Acute pharyngitis, unspecified: Secondary | ICD-10-CM | POA: Diagnosis not present

## 2024-12-05 LAB — POC COVID19/FLU A&B COMBO
Covid Antigen, POC: NEGATIVE
Influenza A Antigen, POC: NEGATIVE
Influenza B Antigen, POC: NEGATIVE

## 2024-12-05 NOTE — Assessment & Plan Note (Signed)
-  Not tolerating Flomax  due to dizziness so he discontinued.  - Urologist started him on Finasteride  which is working better for him so he is currently taking this daily

## 2024-12-05 NOTE — Progress Notes (Signed)
 "  Acute Office Visit  Subjective:     Patient ID: Brandon Gonzales, male    DOB: Jul 14, 1951, 74 y.o.   MRN: [id number]  Chief Complaint  Patient presents with   Sore Throat    HPI  Discussed the use of AI scribe software for clinical note transcription with the patient, who gave verbal consent to proceed.  History of Present Illness   Brandon Gonzales is a 74 year old male who presents with symptoms of a respiratory infection.  Acute respiratory symptoms - Onset Wednesday with sore throat and general malaise - Progression to body aches and low-grade fever - Cough present, not yet productive or involving the chest - Significant fatigue and discomfort, described as feeling as though 'someone will run over' him - No gastrointestinal symptoms: no diarrhea, nausea, or vomiting - Using cough drops with effective symptom relief - Not taking over-the-counter medications due to sensitivity to antihistamines, which cause a 'fuzzy' feeling/confusion - Taking two ounces of aloe twice daily to soothe throat  Otorrhagia - Blood in ear after cleaning with Q-tip - No ear pain or hearing loss   ROS Per HPI     Objective:    BP 136/77   Pulse 65   Temp 98.2 F (36.8 C) (Oral)   Ht 5' 8 (1.727 m)   Wt 175 lb (79.4 kg)   SpO2 97%   BMI 26.61 kg/m    Physical Exam Constitutional:      General: He is not in acute distress.    Appearance: Normal appearance.  HENT:     Right Ear: Tympanic membrane normal.     Left Ear: Tympanic membrane normal.     Mouth/Throat:     Mouth: Mucous membranes are moist.     Pharynx: Posterior oropharyngeal erythema present.  Cardiovascular:     Rate and Rhythm: Normal rate and regular rhythm.     Heart sounds: Normal heart sounds. No murmur heard.    No friction rub. No gallop.  Pulmonary:     Effort: Pulmonary effort is normal. No respiratory distress.     Breath sounds: Normal breath sounds.  Musculoskeletal:        General: No swelling.   Lymphadenopathy:     Cervical: Cervical adenopathy present.  Skin:    General: Skin is warm and dry.  Neurological:     General: No focal deficit present.     Mental Status: He is alert.  Psychiatric:        Mood and Affect: Mood normal.        Behavior: Behavior normal.        Thought Content: Thought content normal.      No results found for any visits on 12/05/24.      Assessment & Plan:   Sore throat -     Coronavirus (COVID-19) with Influenza A and Influenza B -     POC Covid19/Flu A&B Antigen  Fever, unspecified fever cause -     Coronavirus (COVID-19) with Influenza A and Influenza B -     POC Covid19/Flu A&B Antigen  Nasal congestion -     POC Covid19/Flu A&B Antigen  Acute URI Assessment & Plan: -POC Covid/Flu negative  - Given symptoms have only been going on for 2 days, discussed likely viral etiology. Discussed that antibiotics likely would do more harm than good right now due to likelihood of GI side effects.  - Patient has contraindication to antihistamines and does not want to  try cough syrups at this time.  - Recommend Musinex DM, warm salt water/ aloe water gargles, Tylenol , humidifier, rest, and fluids.  - Advised that if symptoms are no better by his appointment with Dr. Chandra on Monday, to notify so that we can reassess treatment plan.    Lower urinary tract symptoms (LUTS) Assessment & Plan: -Not tolerating Flomax  due to dizziness so he discontinued.  - Urologist started him on Finasteride  which is working better for him so he is currently taking this daily       Return if symptoms worsen or fail to improve.  Saddie JULIANNA Sacks, PA-C  "

## 2024-12-05 NOTE — Patient Instructions (Addendum)
 It was nice to see you today!  As we discussed in clinic:  -Your COVID/Flu tests are negative so no anti-viral medications are currently indicated.  - When symptoms have been going on less than 7 days, the most likely etiology is viral so antibiotics would likely do more harm than good at this time.  - I would recommend picking up Musinex DM from the pharmacy to help with the congestion.  - Flonase  nasal spray - 2 sprays in each nostril once daily  - Continue with cough drops as needed to soothe the throat  - Drink plenty of fluids and rest.  - I would also recommend Tylenol  1000 mg every 8 hours for symptom control (fever, body aches, pain relief, etc.) - I would also recommend setting up a humidifier to help with symptoms control  - If symptoms are no better by your office visit on Monday, please let us  know so we can reassess treatment!   Feel better soon!   If you have any problems before your next visit feel free to message me via MyChart (minor issues or questions) or call the office, otherwise you may reach out to schedule an office visit.  Thank you! Saddie Sacks, PA-C

## 2024-12-05 NOTE — Assessment & Plan Note (Addendum)
-  POC Covid/Flu negative  - Given symptoms have only been going on for 2 days, discussed likely viral etiology. Discussed that antibiotics likely would do more harm than good right now due to likelihood of GI side effects.  - Patient has contraindication to antihistamines and does not want to try cough syrups at this time.  - Recommend Musinex DM, warm salt water/ aloe water gargles, Tylenol , humidifier, rest, and fluids.  - Advised that if symptoms are no better by his appointment with Dr. Chandra on Monday, to notify so that we can reassess treatment plan.

## 2024-12-06 ENCOUNTER — Other Ambulatory Visit: Payer: Self-pay | Admitting: Cardiology

## 2024-12-08 ENCOUNTER — Ambulatory Visit: Payer: Medicare HMO

## 2024-12-08 DIAGNOSIS — I480 Paroxysmal atrial fibrillation: Secondary | ICD-10-CM

## 2024-12-09 ENCOUNTER — Ambulatory Visit (INDEPENDENT_AMBULATORY_CARE_PROVIDER_SITE_OTHER): Admitting: Family Medicine

## 2024-12-09 ENCOUNTER — Encounter: Payer: Self-pay | Admitting: Family Medicine

## 2024-12-09 VITALS — BP 91/59 | HR 63 | Ht 68.0 in | Wt 175.1 lb

## 2024-12-09 DIAGNOSIS — R399 Unspecified symptoms and signs involving the genitourinary system: Secondary | ICD-10-CM

## 2024-12-09 DIAGNOSIS — R972 Elevated prostate specific antigen [PSA]: Secondary | ICD-10-CM | POA: Diagnosis not present

## 2024-12-09 DIAGNOSIS — J069 Acute upper respiratory infection, unspecified: Secondary | ICD-10-CM | POA: Diagnosis not present

## 2024-12-09 MED ORDER — AMOXICILLIN 875 MG PO TABS: 875.0000 mg | ORAL_TABLET | Freq: Two times a day (BID) | ORAL | 0 refills | Status: AC

## 2024-12-09 NOTE — Assessment & Plan Note (Signed)
 Prostate significantly enlarged, PSA levels decreased from 13.1 to 8 when rechecked at his urology appt. States the plan was to get an mri of his prostate before his urologist retired. Pt states he was told he could not get an mri w/ his pacemaker, which was implanted in 2022.  Advised him that in most cases mri's are compatible with newer pacemakers but radiology will check before.  Pt would like new referral to [facility].   - Referred to [facility] for further evaluation and management. - continue tamsulosin  and finasteride .

## 2024-12-09 NOTE — Progress Notes (Unsigned)
 "  Established Patient Office Visit  Subjective   Patient ID: Brandon Gonzales, male    DOB: 07/26/1951  Age: 74 y.o. MRN: [id number]  Chief Complaint  Patient presents with   Medical Management of Chronic Issues     History of Present Illness   Brandon Gonzales is a 74 year old male who presents with a persistent sore throat and cough.  He has been experiencing a persistent sore throat since late Tuesday evening or early Wednesday of last week. The sore throat is not related to coughing. He also has green mucus production, which he is coughing up, and suspects it may be dripping down from his sinuses. No trouble breathing or shortness of breath. He reports subjective fevers, chills, and night sweats but did not measure his temperature.  He was previously advised to take Mucinex DM and a nasal spray. He has a history of sinus infections but notes that this episode is different as he has not experienced the typical yellow discharge associated with his past sinus infections. He has taken Mucinex in pill form and avoids antihistamines as they worsen his symptoms.  He has a history of prostate issues, with a significantly enlarged prostate and elevated PSA levels. His PSA was initially 13.1 and later decreased to 8 without finasteride  treatment. He is currently on finasteride . He is concerned about the possibility of prostate cancer, especially given his brother-in-law's history of prostate cancer that metastasized. He is concerned about undergoing MRI due to a pacemaker implanted in 2022.  He has a pacemaker, placed in 2022, and a history of a stroke in October 2021. He is under the care of cardiologists Dr. Jordan and Dr. Waddell, with Dr. Waddell being the one who implanted the pacemaker.  He is on Repatha  for cholesterol management and reports that his blood pressure was slightly low this morning, but he is asymptomatic.          The ASCVD Risk score (Arnett DK, et al., 2019) failed to  calculate for the following reasons:   Risk score cannot be calculated because patient has a medical history suggesting prior/existing ASCVD   * - Cholesterol units were assumed  Health Maintenance Due  Topic Date Due   Medicare Annual Wellness (AWV)  11/24/2021   COVID-19 Vaccine (1 - 2025-26 season) Never done      Objective:     BP (!) 91/59   Pulse 63   Ht 5' 8 (1.727 m)   Wt 175 lb 1.9 oz (79.4 kg)   SpO2 98%   BMI 26.63 kg/m  {Vitals History (Optional):23777}  Physical Exam     Gen: alert, oriented Heent: nasal congestion Pulm: no respiratory distress. No wheezing or crackles.  Psych: pleasant affect       No results found for any visits on 12/09/24.      Assessment & Plan:   Elevated PSA Assessment & Plan: Prostate significantly enlarged, PSA levels decreased from 13.1 to 8 when rechecked at his urology appt. States the plan was to get an mri of his prostate before his urologist retired. Pt states he was told he could not get an mri w/ his pacemaker, which was implanted in 2022.  Advised him that in most cases mri's are compatible with newer pacemakers but radiology will check before.  Pt would like new referral to [facility].   - Referred to [facility] for further evaluation and management. - continue tamsulosin  and finasteride .   Orders: -  Ambulatory referral to Urology  Lower urinary tract symptoms (LUTS) -     Ambulatory referral to Urology  Acute URI Assessment & Plan: Symptoms include sore throat, cough, and green sputum. No shortness of breath or fever. Will treat for sinusitis given no improvement in symptoms.  - Prescribed amoxicillin  for 5 days. - Continue Mucinex DM as needed for cough.   Other orders -     Amoxicillin ; Take 1 tablet (875 mg total) by mouth 2 (two) times daily for 5 days.  Dispense: 10 tablet; Refill: 0            Return in about 4 months (around 04/08/2025) for prostate.    Toribio MARLA Slain,  MD  "

## 2024-12-09 NOTE — Assessment & Plan Note (Addendum)
 Symptoms include sore throat, cough, and green sputum. No shortness of breath or fever. Will treat for sinusitis given no improvement in symptoms.  - Prescribed amoxicillin  for 5 days. - Continue Mucinex DM as needed for cough.

## 2024-12-09 NOTE — Patient Instructions (Signed)
" °  VISIT SUMMARY: You visited us  today due to a persistent sore throat and cough. We discussed your symptoms, potential prostate issues, and ongoing cholesterol management.   -Take amoxicillin  for 5 days. -Continue using Mucinex DM as needed for your cough.  -You are referred to [facility] for further evaluation and management. -Make sure the urology team knows about your pacemaker and MRI limitations. -Follow up with us  if you do not hear about your referral within 3 weeks.     "

## 2024-12-10 ENCOUNTER — Telehealth: Payer: Self-pay

## 2024-12-10 LAB — CUP PACEART REMOTE DEVICE CHECK
Battery Remaining Longevity: 62 mo
Battery Remaining Percentage: 61 %
Battery Voltage: 2.99 V
Brady Statistic AP VP Percent: 1 %
Brady Statistic AP VS Percent: 53 %
Brady Statistic AS VP Percent: 1 %
Brady Statistic AS VS Percent: 45 %
Brady Statistic RA Percent Paced: 50 %
Brady Statistic RV Percent Paced: 1 %
Date Time Interrogation Session: 20260105022112
HighPow Impedance: 73 Ohm
Implantable Lead Connection Status: 753985
Implantable Lead Connection Status: 753985
Implantable Lead Implant Date: 20220407
Implantable Lead Implant Date: 20220407
Implantable Lead Location: 753859
Implantable Lead Location: 753860
Implantable Lead Model: 7122
Implantable Pulse Generator Implant Date: 20220407
Lead Channel Impedance Value: 360 Ohm
Lead Channel Impedance Value: 450 Ohm
Lead Channel Pacing Threshold Amplitude: 0.5 V
Lead Channel Pacing Threshold Amplitude: 0.75 V
Lead Channel Pacing Threshold Pulse Width: 0.5 ms
Lead Channel Pacing Threshold Pulse Width: 0.5 ms
Lead Channel Sensing Intrinsic Amplitude: 0.8 mV
Lead Channel Sensing Intrinsic Amplitude: 10.9 mV
Lead Channel Setting Pacing Amplitude: 2 V
Lead Channel Setting Pacing Amplitude: 2.5 V
Lead Channel Setting Pacing Pulse Width: 0.5 ms
Lead Channel Setting Sensing Sensitivity: 0.5 mV
Pulse Gen Serial Number: 810023633
Zone Setting Status: 755011

## 2024-12-10 NOTE — Telephone Encounter (Signed)
 Dr Matilda would like for pt to repeat a PSA around 01/13/25. Called pt to schedule lab appt, [facility] for pt to return call.

## 2024-12-11 ENCOUNTER — Ambulatory Visit: Payer: Self-pay | Admitting: Cardiology

## 2024-12-11 NOTE — Progress Notes (Signed)
 Remote ICD Transmission

## 2024-12-27 ENCOUNTER — Other Ambulatory Visit: Payer: Self-pay | Admitting: Family Medicine

## 2024-12-29 NOTE — Telephone Encounter (Signed)
 Please ask the pt if he requested this refill?  His note with kara on jan 2nd said he discontinued it due to dizziness.

## 2025-01-07 ENCOUNTER — Ambulatory Visit: Admitting: Urology

## 2025-01-19 ENCOUNTER — Ambulatory Visit: Admitting: Urology

## 2025-04-13 ENCOUNTER — Ambulatory Visit: Admitting: Cardiology
